# Patient Record
Sex: Female | Born: 1960 | Race: White | Hispanic: No | Marital: Married | Smoking: Former smoker
Health system: Southern US, Community
[De-identification: ages and names within clinical notes are randomized; demographics above are authoritative.]

## PROBLEM LIST (undated history)

## (undated) DIAGNOSIS — M069 Rheumatoid arthritis, unspecified: Secondary | ICD-10-CM

## (undated) DIAGNOSIS — G243 Spasmodic torticollis: Secondary | ICD-10-CM

## (undated) DIAGNOSIS — M659 Synovitis and tenosynovitis, unspecified: Secondary | ICD-10-CM

## (undated) DIAGNOSIS — Z8619 Personal history of other infectious and parasitic diseases: Secondary | ICD-10-CM

## (undated) DIAGNOSIS — F329 Major depressive disorder, single episode, unspecified: Secondary | ICD-10-CM

## (undated) DIAGNOSIS — M797 Fibromyalgia: Secondary | ICD-10-CM

## (undated) DIAGNOSIS — R42 Dizziness and giddiness: Secondary | ICD-10-CM

## (undated) DIAGNOSIS — M65949 Unspecified synovitis and tenosynovitis, unspecified hand: Secondary | ICD-10-CM

## (undated) DIAGNOSIS — K219 Gastro-esophageal reflux disease without esophagitis: Secondary | ICD-10-CM

## (undated) DIAGNOSIS — F32A Depression, unspecified: Secondary | ICD-10-CM

## (undated) DIAGNOSIS — F419 Anxiety disorder, unspecified: Secondary | ICD-10-CM

## (undated) HISTORY — DX: Anxiety disorder, unspecified: F41.9

## (undated) HISTORY — DX: Unspecified synovitis and tenosynovitis, unspecified hand: M65.949

## (undated) HISTORY — DX: Dizziness and giddiness: R42

## (undated) HISTORY — PX: TONSILLECTOMY: SUR1361

## (undated) HISTORY — DX: Synovitis and tenosynovitis, unspecified: M65.9

## (undated) HISTORY — PX: TUBAL LIGATION: SHX77

## (undated) HISTORY — DX: Personal history of other infectious and parasitic diseases: Z86.19

---

## 2002-03-11 ENCOUNTER — Other Ambulatory Visit: Admission: RE | Admit: 2002-03-11 | Discharge: 2002-03-11 | Payer: Self-pay | Admitting: Family Medicine

## 2003-03-16 ENCOUNTER — Encounter: Payer: Self-pay | Admitting: General Surgery

## 2003-03-16 ENCOUNTER — Ambulatory Visit (HOSPITAL_COMMUNITY): Admission: RE | Admit: 2003-03-16 | Discharge: 2003-03-16 | Payer: Self-pay | Admitting: General Surgery

## 2003-04-13 ENCOUNTER — Ambulatory Visit (HOSPITAL_COMMUNITY): Admission: RE | Admit: 2003-04-13 | Discharge: 2003-04-13 | Payer: Self-pay | Admitting: Family Medicine

## 2003-04-13 ENCOUNTER — Encounter: Payer: Self-pay | Admitting: Family Medicine

## 2005-01-12 ENCOUNTER — Other Ambulatory Visit: Admission: RE | Admit: 2005-01-12 | Discharge: 2005-01-12 | Payer: Self-pay | Admitting: Obstetrics and Gynecology

## 2005-01-18 ENCOUNTER — Ambulatory Visit (HOSPITAL_COMMUNITY): Admission: RE | Admit: 2005-01-18 | Discharge: 2005-01-18 | Payer: Self-pay | Admitting: Obstetrics and Gynecology

## 2006-01-15 ENCOUNTER — Other Ambulatory Visit: Admission: RE | Admit: 2006-01-15 | Discharge: 2006-01-15 | Payer: Self-pay | Admitting: Obstetrics and Gynecology

## 2006-01-30 ENCOUNTER — Ambulatory Visit (HOSPITAL_COMMUNITY): Admission: RE | Admit: 2006-01-30 | Discharge: 2006-01-30 | Payer: Self-pay | Admitting: Obstetrics and Gynecology

## 2006-09-01 HISTORY — PX: VAGINAL HYSTERECTOMY: SUR661

## 2006-09-12 ENCOUNTER — Ambulatory Visit (HOSPITAL_COMMUNITY): Admission: RE | Admit: 2006-09-12 | Discharge: 2006-09-13 | Payer: Self-pay | Admitting: Obstetrics and Gynecology

## 2006-09-12 ENCOUNTER — Encounter (INDEPENDENT_AMBULATORY_CARE_PROVIDER_SITE_OTHER): Payer: Self-pay | Admitting: *Deleted

## 2007-01-17 ENCOUNTER — Other Ambulatory Visit: Admission: RE | Admit: 2007-01-17 | Discharge: 2007-01-17 | Payer: Self-pay | Admitting: Obstetrics and Gynecology

## 2007-02-01 ENCOUNTER — Ambulatory Visit (HOSPITAL_COMMUNITY): Admission: RE | Admit: 2007-02-01 | Discharge: 2007-02-01 | Payer: Self-pay | Admitting: Obstetrics and Gynecology

## 2008-01-23 ENCOUNTER — Other Ambulatory Visit: Admission: RE | Admit: 2008-01-23 | Discharge: 2008-01-23 | Payer: Self-pay | Admitting: Obstetrics and Gynecology

## 2008-04-30 ENCOUNTER — Emergency Department (HOSPITAL_COMMUNITY): Admission: EM | Admit: 2008-04-30 | Discharge: 2008-04-30 | Payer: Self-pay | Admitting: Emergency Medicine

## 2009-04-30 ENCOUNTER — Ambulatory Visit: Payer: Self-pay | Admitting: Obstetrics and Gynecology

## 2009-04-30 ENCOUNTER — Other Ambulatory Visit: Admission: RE | Admit: 2009-04-30 | Discharge: 2009-04-30 | Payer: Self-pay | Admitting: Obstetrics and Gynecology

## 2009-04-30 ENCOUNTER — Encounter: Payer: Self-pay | Admitting: Obstetrics and Gynecology

## 2009-05-13 ENCOUNTER — Ambulatory Visit (HOSPITAL_COMMUNITY): Admission: RE | Admit: 2009-05-13 | Discharge: 2009-05-13 | Payer: Self-pay | Admitting: Obstetrics and Gynecology

## 2009-05-24 ENCOUNTER — Encounter: Admission: RE | Admit: 2009-05-24 | Discharge: 2009-05-24 | Payer: Self-pay | Admitting: Obstetrics and Gynecology

## 2009-08-23 ENCOUNTER — Emergency Department (HOSPITAL_COMMUNITY): Admission: EM | Admit: 2009-08-23 | Discharge: 2009-08-23 | Payer: Self-pay | Admitting: Emergency Medicine

## 2009-10-07 ENCOUNTER — Encounter (INDEPENDENT_AMBULATORY_CARE_PROVIDER_SITE_OTHER): Payer: Self-pay | Admitting: *Deleted

## 2009-10-11 ENCOUNTER — Ambulatory Visit: Payer: Self-pay | Admitting: Gastroenterology

## 2009-10-25 ENCOUNTER — Ambulatory Visit: Payer: Self-pay | Admitting: Gastroenterology

## 2009-10-25 HISTORY — PX: COLONOSCOPY: SHX174

## 2010-08-02 NOTE — Letter (Signed)
Summary: [facility] Instructions  [facility]  [address] [place], [place] [address]   Phone: [phone]  Fax: [phone]       Courtney Myers    1961-01-28    MRN: [id number]        Procedure Day Dorna Bloom:  [facility]  10/25/09     Arrival Time:  10:00AM     Procedure Time:  11:00AM     Location of Procedure:                    _X _  [facility] (4th Floor)                        PREPARATION FOR COLONOSCOPY WITH MOVIPREP   Starting 5 days prior to your procedure 10/20/09 do not eat nuts, seeds, popcorn, corn, beans, peas,  salads, or any raw vegetables.  Do not take any fiber supplements (e.g. Metamucil, Citrucel, and Benefiber).  THE DAY BEFORE YOUR PROCEDURE         DATE: 10/24/09  DAY: SUNDAY  1.  Drink clear liquids the entire day-NO SOLID FOOD  2.  Do not drink anything colored red or purple.  Avoid juices with pulp.  No orange juice.  3.  Drink at least 64 oz. (8 glasses) of fluid/clear liquids during the day to prevent dehydration and help the prep work efficiently.  CLEAR LIQUIDS INCLUDE: Water Jello Ice Popsicles Tea (sugar ok, no milk/cream) Powdered fruit flavored drinks Coffee (sugar ok, no milk/cream) Gatorade Juice: apple, white grape, white cranberry  Lemonade Clear bullion, consomm, broth Carbonated beverages (any kind) Strained chicken noodle soup Hard Candy                             4.  In the morning, mix first dose of MoviPrep solution:    Empty 1 Pouch A and 1 Pouch B into the disposable container    Add lukewarm drinking water to the top line of the container. Mix to dissolve    Refrigerate (mixed solution should be used within 24 hrs)  5.  Begin drinking the prep at 5:00 p.m. The MoviPrep container is divided by 4 marks.   Every 15 minutes drink the solution down to the next mark (approximately 8 oz) until the full liter is complete.   6.  Follow completed prep with 16 oz of clear liquid of your choice (Nothing  red or purple).  Continue to drink clear liquids until bedtime.  7.  Before going to bed, mix second dose of MoviPrep solution:    Empty 1 Pouch A and 1 Pouch B into the disposable container    Add lukewarm drinking water to the top line of the container. Mix to dissolve    Refrigerate  THE DAY OF YOUR PROCEDURE      DATE: 10/25/09  DAY: MONDAY  Beginning at 6:00a.m. (5 hours before procedure):         1. Every 15 minutes, drink the solution down to the next mark (approx 8 oz) until the full liter is complete.  2. Follow completed prep with 16 oz. of clear liquid of your choice.    3. You may drink clear liquids until 9:00AM (2 HOURS BEFORE PROCEDURE).   MEDICATION INSTRUCTIONS  Unless otherwise instructed, you should take regular prescription medications with a small sip of water   as early as possible the morning  of your procedure.           OTHER INSTRUCTIONS  You will need a responsible adult at least 52 years of age to accompany you and drive you home.   This person must remain in the waiting room during your procedure.  Wear loose fitting clothing that is easily removed.  Leave jewelry and other valuables at home.  However, you may wish to bring a book to read or  an iPod/MP3 player to listen to music as you wait for your procedure to start.  Remove all body piercing jewelry and leave at home.  Total time from sign-in until discharge is approximately 2-3 hours.  You should go home directly after your procedure and rest.  You can resume normal activities the  day after your procedure.  The day of your procedure you should not:   Drive   Make legal decisions   Operate machinery   Drink alcohol   Return to work  You will receive specific instructions about eating, activities and medications before you leave.    The above instructions have been reviewed and explained to me by   Ernestine Conrad, RN_______________________    I fully understand and  can verbalize these instructions _____________________________ Date _________

## 2010-08-02 NOTE — Procedures (Signed)
Summary: Colonoscopy  Patient: Courtney Myers Note: All result statuses are Final unless otherwise noted.  Tests: (1) Colonoscopy (COL)   COL Colonoscopy           DONE     [facility]     [address]     [place], [place]  [address]           COLONOSCOPY PROCEDURE REPORT           PATIENT:  [name], [name]  MR#:  [id number]     BIRTHDATE:  07-28-1960, 49 yrs. old  GENDER:  female     ENDOSCOPIST:  Vania Rea. Jarold Motto, MD, [facility] [place]     REF. BY:  Renaye Rakers, M.D.     PROCEDURE DATE:  10/25/2009     PROCEDURE:  Average-risk screening colonoscopy     G0121     ASA CLASS:  Class II     INDICATIONS:  Routine Risk Screening     MEDICATIONS:   Fentanyl 75 mcg IV, Versed 8 mg IV, Benadryl 12.5     mg IV           DESCRIPTION OF PROCEDURE:   After the risks benefits and     alternatives of the procedure were thoroughly explained, informed     consent was obtained.  Digital rectal exam was performed and     revealed no abnormalities.   The LB CF-[id number] endoscope     was introduced through the anus and advanced to the cecum, which     was identified by both the appendix and ileocecal valve, without     limitations.  The quality of the prep was excellent, using     MoviPrep.  The instrument was then slowly withdrawn as the colon     was fully examined.     <<PROCEDUREIMAGES>>           FINDINGS:  Scattered diverticula were found in the sigmoid colon.     No polyps or cancers were seen.  This was otherwise a normal     examination of the colon.   Retroflexed views in the rectum     revealed no abnormalities.    The scope was then withdrawn from     the patient and the procedure completed.           COMPLICATIONS:  None     ENDOSCOPIC IMPRESSION:     1) Diverticula, scattered in the sigmoid colon     2) No polyps or cancers     3) Otherwise normal examination     RECOMMENDATIONS:     1) high fiber diet     2) Continue current colorectal screening recommendations for  "routine risk" patients with a repeat colonoscopy in 10 years.     REPEAT EXAM:  No           ______________________________     Vania Rea. Jarold Motto, MD, [facility]           CC:           n.     eSIGNED:   Vania Rea. Landyn Lorincz at 10/25/2009 11:47 AM           Charlott Rakes, [id number]  Note: An exclamation mark (!) indicates a result that was not dispersed into the flowsheet. Document Creation Date: 10/25/2009 11:48 AM _______________________________________________________________________  (1) Order result status: Final Collection or observation date-time: 10/25/2009 11:43 Requested date-time:  Receipt date-time:  Reported date-time:  Referring Physician:  Ordering Physician: Sheryn Bison [id number]) Specimen Source:  Source: Launa Grill Order Number: [id number] Lab site:   Appended Document: Colonoscopy    Clinical Lists Changes  Observations: Added new observation of COLONNXTDUE: 10/2019 (10/25/2009 12:43)

## 2010-08-02 NOTE — Miscellaneous (Signed)
Summary: previsit   Clinical Lists Changes  Medications: Added new medication of MOVIPREP 100 GM  SOLR (PEG-KCL-NACL-NASULF-NA ASC-C) As directed - Signed Rx of MOVIPREP 100 GM  SOLR (PEG-KCL-NACL-NASULF-NA ASC-C) As directed;  #1 x 0;  Signed;  Entered by: Clide Cliff RN;  Authorized by: Mardella Layman MD [facility];  Method used: Electronically to [facility] [address] [place], [place], [place]  [address], Ph: [phone], Fax: [phone] Observations: Added new observation of ALLERGY REV: Done (10/11/2009 12:43)    Prescriptions: MOVIPREP 100 GM  SOLR (PEG-KCL-NACL-NASULF-NA ASC-C) As directed  #1 x 0   Entered by:   Clide Cliff RN   Authorized by:   Mardella Layman MD [facility]   Signed by:   Clide Cliff RN on 10/11/2009   Method used:   Electronically to        [facility]* (retail)       [address]       [place], [place]  [address]       Ph: [phone]       Fax: [phone]   RxID:   [id number]

## 2010-08-03 ENCOUNTER — Other Ambulatory Visit: Payer: Self-pay | Admitting: Obstetrics and Gynecology

## 2010-08-03 DIAGNOSIS — Z1239 Encounter for other screening for malignant neoplasm of breast: Secondary | ICD-10-CM

## 2010-08-03 DIAGNOSIS — Z1231 Encounter for screening mammogram for malignant neoplasm of breast: Secondary | ICD-10-CM

## 2010-08-11 ENCOUNTER — Ambulatory Visit (HOSPITAL_COMMUNITY): Admission: RE | Admit: 2010-08-11 | Payer: 59 | Source: Ambulatory Visit

## 2010-11-18 NOTE — Discharge Summary (Signed)
NAME:  Courtney Myers, Courtney Myers                ACCOUNT NO.:  [id number]   MEDICAL RECORD NO.:  [id number]          PATIENT TYPE:  OIB   LOCATION:  9317                          [facility]ACILITY:  [facility]   PHYSICIAN:  Courtney L. Gottsegen, M.D.DATE OF BIRTH:  13-Jan-1961   DATE OF ADMISSION:  09/12/2006  DATE OF DISCHARGE:  09/13/2006                               DISCHARGE SUMMARY   The patient is a 50 year old female who was admitted to the hospital  with menorrhagia for definitive surgery. On the day of admission she was  taken to the operating room and a vaginal hysterectomy was performed.  She was kept overnight for observation. On the next day she was ready  for discharge.   DISCHARGE MEDICATIONS:  Advil or Aleve for pain relief with Vicodin  5/500 as backup.   DIET:  Was regular.   ACTIVITY:  Was ambulatory.   FOLLOWUP:  She will return to the office in 3 weeks for follow-up.  Final pathology report is not available at time of dictation.   DISCHARGE DIAGNOSIS:  Refractory dysfunctional uterine bleeding with  probable adenomyosis.   OPERATION:  Vaginal hysterectomy.   CONDITION ON DISCHARGE:  Is improved.      Courtney Myers, M.D.  Electronically Signed     DLG/MEDQ  D:  09/13/2006  T:  09/13/2006  Job:  [id number]

## 2010-11-18 NOTE — H&P (Signed)
NAME:  Courtney Myers, Courtney Myers NO.:  [id number]   MEDICAL RECORD NO.:  [id number]         PATIENT TYPE:  WOIB   LOCATION:                                [facility]   PHYSICIAN:  Daniel L. Gottsegen, M.D.DATE OF BIRTH:  1960/10/09   DATE OF ADMISSION:  09/12/2006  DATE OF DISCHARGE:  09/13/2006                              HISTORY & PHYSICAL   She is for the operating room in [facility] tomorrow at 1 p.m.   CHIEF COMPLAINT:  Menometrorrhagia with fatigue.   HISTORY OF PRESENT ILLNESS:  The patient is a 50 year old Gravida IV,  para 2-0-2-2 who over the past several years has had a progressively  increasing history of very heavy long periods. Her periods last seven  days.  She has to change tampons frequently. She is becoming extremely  fatigued by this. It is clearly impacting her life significantly.  She  remains a smoker and, therefore, is not a candidate for birth control  pills on a long term  basis.  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 She has had two D&C's after two pregnancies ended and the  suspicion is that this is what they are from.  However, the presence of  these multiple synechiae makes her a bad candidate for endometrial  ablation.  As a result of this significant issue, and her failure to  respond to nonsteroidals and all the above, she now enters the hospital  for a vaginal hysterectomy.  We will save her ovaries assuming that they  are normal but she has given me permission to remove them for  significant disease.   PAST MEDICAL HISTORY:  The patient has had tubal ligation.  She has also  had a T&A.  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  She has also had CIN and  underwent carotid surgery for that.   CURRENT MEDICATIONS:   AcipHex, Klonopin, Paxil for anxiety.   ALLERGIES:  She is allergic to no drugs.   FAMILY HISTORY:  Reveals that paternal grandfather is diabetic. Her  father, maternal grandfather are hypertensive. Her paternal grandfather  and father also have coronary artery disease.   SOCIAL HISTORY:  Patient is a smoker. She also drinks alcohol socially.  She also drinks significant caffeine.   REVIEW OF SYSTEMS:  HEENT:  Negative.  CARDIAC: Negative.  RESPIRATORY:  Negative.  GI:  Negative.  MUSCULOSKELETAL:  See above.  GU:  Negative.  NEUROLOGICAL/PSYCHIATRIC:  Reveals a history of  both tremors and  anxiety.  An immunological, lymphatic and endocrine is negative.   PHYSICAL EXAMINATION:  GENERAL:  The patient is a well developed, well  nourished female in no acute distress.  VITAL SIGNS:  Blood pressure 120/70, pulse 80 and regular, respiration  rate 16 and unlabored. She is afebrile.  HEENT:  All within normal limits.  NECK:  Supple. Trachea in the  midline. Thyroid is not enlarged.  LUNGS:  Clear to P&A.  HEART:  No thrills, heaves or murmurs.  BREASTS:  No masses.  ABDOMEN:  Soft without guarding, rebound or masses.  PELVIC:  External is normal.  BUS is normal. Vaginal is normal. Cervix  is clean. Pap smear shows no atypia. Uterus is retroverted.  Normal size  and shape.  Adnexa failed to reveal masses.  RECTAL:  Rectal is negative.   IMPRESSION:  Menometrorrhagia.   PLAN:  Vaginal hysterectomy.      Daniel L. Eda Paschal, M.D.  Electronically Signed     DLG/MEDQ  D:  09/11/2006  T:  09/11/2006  Job:  [id number]

## 2010-11-18 NOTE — Op Note (Signed)
NAME:  Courtney Myers, Courtney Myers                ACCOUNT NO.:  [id number]   MEDICAL RECORD NO.:  [id number]          PATIENT TYPE:  OIB   LOCATION:  9317                          [facility]ILITY:  [facility]   PHYSICIAN:  Daniel L. Gottsegen, M.D.DATE OF BIRTH:  Feb 21, 1961   DATE OF PROCEDURE:  09/12/2006  DATE OF DISCHARGE:                               OPERATIVE REPORT   PREOPERATIVE DIAGNOSIS:  Dysfunctional uterine bleeding.   POSTOPERATIVE DIAGNOSIS:  Dysfunctional uterine bleeding with probable  adenomyosis.   OPERATIONS:  Vaginal hysterectomy.   SURGEON:  Daniel L. Eda Paschal, M.D.   FIRST ASSISTANT:  Juan H. Lily Peer, M.D.   FINDINGS:  At the time of surgery, the patient had a very boggy enlarged  uterus without any specific pathology.  Ovaries, fallopian tubes and  pelvic peritoneum were all free of disease.   PROCEDURE:  After adequate general endotracheal anesthesia, the patient  was placed in the dorsal lithotomy position, prepped and draped in the  usual sterile manner.  A 1:200,000 solution of epinephrine was injected  around the cervix.  A 360 degree incision was made around the cervix.  The bladder was mobilized superiorly as was the posterior peritoneum.  Posterior peritoneum was entered by sharp dissection.  The uterosacral  ligaments were clamped, cut and sutured to the vault laterally for good  vault support.  They were also shortened.  The cardinal ligaments were  clamped, cut and suture ligated.  At this point the vesicouterine fold  of peritoneum could be entered by sharp dissection without trauma.  The  uterine arteries were clamped, cut and doubly suture ligated.  The  balance of the broad ligament was clamped, cut and suture ligated.  Suture material for all the above-mentioned pedicles was #1 chromic  catgut.  The uterus was delivered and the utero-ovarian round ligaments  and fallopian tubes were clamped, cut and doubly suture ligated with #1  chromic catgut.  The uterus was  sent to pathology for tissue diagnosis.  The ovaries were inspected and were normal.  The vaginal cuff was whip  stitched with a running locking 0 Vicryl, sewing the cuff to the  peritoneum.  There was no bleeding noted at this point.  Copious  irrigation was done with sterile saline.  Two sponge, needle and  instrument counts were correct.  The vaginal cuff and the peritoneum  were then closed with figure-of-eights of #1 chromic catgut.  A Foley  catheter was placed at the termination of procedure which drained clear  urine.  The patient left the operating satisfactory condition.      Daniel L. Eda Paschal, M.D.  Electronically Signed     DLG/MEDQ  D:  09/12/2006  T:  09/13/2006  Job:  [id number]

## 2012-05-06 ENCOUNTER — Other Ambulatory Visit: Payer: Self-pay | Admitting: Obstetrics and Gynecology

## 2012-05-06 DIAGNOSIS — Z1231 Encounter for screening mammogram for malignant neoplasm of breast: Secondary | ICD-10-CM

## 2012-05-22 ENCOUNTER — Ambulatory Visit (HOSPITAL_COMMUNITY): Payer: 59 | Attending: Obstetrics and Gynecology

## 2012-09-05 ENCOUNTER — Other Ambulatory Visit: Payer: Self-pay | Admitting: Gastroenterology

## 2012-09-05 DIAGNOSIS — R7989 Other specified abnormal findings of blood chemistry: Secondary | ICD-10-CM

## 2012-09-12 ENCOUNTER — Ambulatory Visit
Admission: RE | Admit: 2012-09-12 | Discharge: 2012-09-12 | Disposition: A | Discharge status: Home / Self Care | Payer: 59 | Source: Ambulatory Visit | Attending: Gastroenterology | Admitting: Gastroenterology

## 2012-09-12 DIAGNOSIS — R7989 Other specified abnormal findings of blood chemistry: Secondary | ICD-10-CM

## 2012-09-22 ENCOUNTER — Encounter (HOSPITAL_COMMUNITY): Payer: Self-pay | Admitting: Emergency Medicine

## 2012-09-22 ENCOUNTER — Emergency Department (HOSPITAL_COMMUNITY)
Admission: EM | Admit: 2012-09-22 | Discharge: 2012-09-22 | Disposition: A | Discharge status: Home / Self Care | Payer: 59 | Attending: Emergency Medicine | Admitting: Emergency Medicine

## 2012-09-22 DIAGNOSIS — K219 Gastro-esophageal reflux disease without esophagitis: Secondary | ICD-10-CM | POA: Insufficient documentation

## 2012-09-22 DIAGNOSIS — S79929A Unspecified injury of unspecified thigh, initial encounter: Secondary | ICD-10-CM | POA: Insufficient documentation

## 2012-09-22 DIAGNOSIS — S79919A Unspecified injury of unspecified hip, initial encounter: Secondary | ICD-10-CM | POA: Insufficient documentation

## 2012-09-22 DIAGNOSIS — F329 Major depressive disorder, single episode, unspecified: Secondary | ICD-10-CM | POA: Insufficient documentation

## 2012-09-22 DIAGNOSIS — Z79899 Other long term (current) drug therapy: Secondary | ICD-10-CM | POA: Insufficient documentation

## 2012-09-22 DIAGNOSIS — Z87891 Personal history of nicotine dependence: Secondary | ICD-10-CM | POA: Insufficient documentation

## 2012-09-22 DIAGNOSIS — Z8739 Personal history of other diseases of the musculoskeletal system and connective tissue: Secondary | ICD-10-CM | POA: Insufficient documentation

## 2012-09-22 DIAGNOSIS — F3289 Other specified depressive episodes: Secondary | ICD-10-CM | POA: Insufficient documentation

## 2012-09-22 DIAGNOSIS — Y9289 Other specified places as the place of occurrence of the external cause: Secondary | ICD-10-CM | POA: Insufficient documentation

## 2012-09-22 DIAGNOSIS — S76301A Unspecified injury of muscle, fascia and tendon of the posterior muscle group at thigh level, right thigh, initial encounter: Secondary | ICD-10-CM

## 2012-09-22 DIAGNOSIS — W010XXA Fall on same level from slipping, tripping and stumbling without subsequent striking against object, initial encounter: Secondary | ICD-10-CM | POA: Insufficient documentation

## 2012-09-22 DIAGNOSIS — Y939 Activity, unspecified: Secondary | ICD-10-CM | POA: Insufficient documentation

## 2012-09-22 DIAGNOSIS — Z8669 Personal history of other diseases of the nervous system and sense organs: Secondary | ICD-10-CM | POA: Insufficient documentation

## 2012-09-22 HISTORY — DX: Depression, unspecified: F32.A

## 2012-09-22 HISTORY — DX: Major depressive disorder, single episode, unspecified: F32.9

## 2012-09-22 HISTORY — DX: Rheumatoid arthritis, unspecified: M06.9

## 2012-09-22 HISTORY — DX: Gastro-esophageal reflux disease without esophagitis: K21.9

## 2012-09-22 HISTORY — DX: Spasmodic torticollis: G24.3

## 2012-09-22 HISTORY — DX: Fibromyalgia: M79.7

## 2012-09-22 MED ORDER — HYDROCODONE-ACETAMINOPHEN 5-325 MG PO TABS: ORAL_TABLET | ORAL | Status: DC

## 2012-09-22 NOTE — ED Notes (Signed)
Patient c/o right upper leg pain. Per patient slipped and fell on ice last Tuesday, pain since. Increased patient with flexion of foot. Capillary refill WNL, pedal and tibial pulse strong. Foot warm.

## 2012-09-24 NOTE — ED Provider Notes (Signed)
History     CSN: [id number]  Arrival date & time 09/22/12  1201   First MD Initiated Contact with Patient 09/22/12 1359      Chief Complaint  Patient presents with  . Leg Pain    (Consider location/radiation/quality/duration/timing/severity/associated sxs/prior treatment) HPI Comments: Patient complains of pain to the posterior right upper leg after a fall. She states the pain is worse with weightbearing and full extension of the leg, pain resolves with flexion and bed rest. She denies numbness, weakness, swelling or discoloration to the leg. She also denies any hip or back pain.  Patient is a 52 y.o. female presenting with leg pain. The history is provided by the patient.  Leg Pain Location:  Leg Time since incident:  5 days Injury: yes   Mechanism of injury: fall   Fall:    Fall occurred: Fell on ice.   Impact surface:  Concrete   Entrapped after fall: no   Leg location:  R upper leg Pain details:    Quality:  Aching   Radiates to:  Does not radiate   Severity:  Mild   Onset quality:  Sudden   Timing:  Constant   Progression:  Unchanged Chronicity:  New Dislocation: no   Foreign body present:  No foreign bodies Prior injury to area:  No Relieved by:  Rest Worsened by:  Bearing weight and extension Ineffective treatments:  None tried Associated symptoms: no back pain, no decreased ROM, no fever, no itching, no muscle weakness, no neck pain, no numbness, no stiffness, no swelling and no tingling     Past Medical History  Diagnosis Date  . RA (rheumatoid arthritis)   . Fibromyalgia   . Cervical dystonia   . GERD (gastroesophageal reflux disease)   . Depression     Past Surgical History  Procedure Laterality Date  . Partial hysterectomy  2008  . Tonsillectomy      Family History  Problem Relation Age of Onset  . Heart failure Other     History  Substance Use Topics  . Smoking status: Former Smoker -- 1.00 packs/day for 30 years    Types: Cigarettes    Quit date: 07/03/2012  . Smokeless tobacco: Never Used  . Alcohol Use: No    OB History   Grav Para Term Preterm Abortions TAB SAB Ect Mult Living   4 2  2 2  1   2       Review of Systems  Constitutional: Negative for fever and chills.  HENT: Negative for neck pain.   Genitourinary: Negative for dysuria and difficulty urinating.  Musculoskeletal: Positive for myalgias. Negative for back pain, joint swelling, arthralgias, gait problem and stiffness.  Skin: Negative for color change, itching and wound.  Neurological: Negative for dizziness, syncope, weakness, numbness and headaches.  All other systems reviewed and are negative.    Allergies  Review of patient's allergies indicates no known allergies.  Home Medications   Current Outpatient Rx  Name  Route  Sig  Dispense  Refill  . clonazePAM (KLONOPIN) 0.5 MG tablet   Oral   Take 0.5 mg by mouth 2 (two) times daily.         . DULoxetine (CYMBALTA) 60 MG capsule   Oral   Take 60 mg by mouth daily.         . folic acid (FOLVITE) 1 MG tablet   Oral   Take 1 mg by mouth daily.         Marland Kitchen  inFLIXimab (REMICADE) 100 MG injection   Intravenous   Inject 100 mg into the vein every 8 (eight) weeks.         . METHOTREXATE SODIUM, PF, IJ   Injection   Inject 0.3 mLs as directed once a week.         . Multiple Vitamins-Minerals (MULTIVITAMINS THER. W/MINERALS) TABS   Oral   Take 1 tablet by mouth daily.         Marland Kitchen omeprazole (PRILOSEC) 20 MG capsule   Oral   Take 20 mg by mouth daily.         Marland Kitchen PRESCRIPTION MEDICATION   Oral   Take 1 tablet by mouth every evening. Prescription Sleeping pill         . HYDROcodone-acetaminophen (NORCO/VICODIN) 5-325 MG per tablet      Take one-two tabs po q 4-6 hrs prn pain   20 tablet   0     BP 127/79  Pulse 103  Temp(Src) 98.2 F (36.8 C) (Oral)  Resp 20  Ht 5\' 3"  (1.6 m)  Wt 170 lb (77.111 kg)  BMI 30.12 kg/m2  SpO2 97%  Physical Exam  Nursing note and  vitals reviewed. Constitutional: She is oriented to person, place, and time. She appears well-developed and well-nourished. No distress.  Cardiovascular: Normal rate, regular rhythm, normal heart sounds and intact distal pulses.   Pulmonary/Chest: Effort normal and breath sounds normal.  Musculoskeletal: She exhibits tenderness. She exhibits no edema.       Right upper leg: She exhibits tenderness. She exhibits no bony tenderness, no swelling, no edema, no deformity and no laceration.       Legs: Tenderness to palpation of the right hamstring. No joint tenderness, no spinal tenderness, no distal tenderness. DP pulse is brisk and symmetrical, distal sensation is intact. Patient has full range of motion of the knee and hip.  Neurological: She is alert and oriented to person, place, and time. No cranial nerve deficit or sensory deficit. She exhibits normal muscle tone. Coordination and gait normal.  Reflex Scores:      Patellar reflexes are 2+ on the right side and 2+ on the left side.      Achilles reflexes are 2+ on the right side. Skin: Skin is warm and dry. No erythema.    ED Course  Procedures (including critical care time)  Labs Reviewed - No data to display No results found.   1. Hamstring injury, right, initial encounter       MDM    Patient has tenderness to palpation along the right hamstring. No deformities are noted, just for range of motion. Neurovascularly intact. She agrees to ice and rest I will prescribe a hydrocodone #20 for pain. She currently takes Remicade and methotrexate for RA, so I will avoid NSAIDs.  Crutches were given for comfort with ambulation  Patient agrees to followup with her primary care physician. Appears stable for discharge.      Amarylis Rovito L. Trisha Mangle, PA-C 09/24/12 1747

## 2012-09-25 NOTE — ED Provider Notes (Signed)
History/physical exam/procedure(s) were performed by non-physician practitioner and as supervising physician I was immediately available for consultation/collaboration. I have reviewed all notes and am in agreement with care and plan.   Danell Vazquez S Addisson Frate, MD 09/25/12 1348 

## 2012-10-21 ENCOUNTER — Telehealth: Payer: Self-pay | Admitting: Physician Assistant

## 2012-10-21 MED ORDER — OMEPRAZOLE 20 MG PO CPDR: 20.0000 mg | DELAYED_RELEASE_CAPSULE | Freq: Every day | ORAL | Status: DC

## 2012-10-21 NOTE — Telephone Encounter (Signed)
Medication refilled per protocol. 

## 2012-12-04 ENCOUNTER — Encounter: Payer: Self-pay | Admitting: Neurology

## 2012-12-04 ENCOUNTER — Ambulatory Visit (INDEPENDENT_AMBULATORY_CARE_PROVIDER_SITE_OTHER): Payer: 59 | Admitting: Neurology

## 2012-12-04 VITALS — BP 105/69 | HR 100 | Ht 65.0 in | Wt 165.0 lb

## 2012-12-04 DIAGNOSIS — G243 Spasmodic torticollis: Secondary | ICD-10-CM

## 2012-12-04 MED ORDER — ABOBOTULINUMTOXINA 500 UNITS IM SOLR
500.0000 [IU] | Freq: Once | INTRAMUSCULAR | Status: AC
  Administered 2012-12-04: 500 [IU] via INTRAMUSCULAR

## 2012-12-04 NOTE — Progress Notes (Signed)
HPI: Ms. 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  Symptom onset was without apparent triggering event, she has gradually developed this neck pulling,  manifested primarily with right laterocollis, mild retrocolis, and left shoulder elevation since 2000. She has good relief with regular Botox injection, about 4 times a year since 2003. Last injection was February 18, 2010 by Dr. Deneen Harts,  She reported great relief as usual, taking 4- 5 days to get symptom relieve, lasting about 3 months.  I began to inject her since 12.2011, every 3-4 months, dosage has been limited to 150 units of BOTOX A.  She denies neck pain, gait difficulty, but with wearing off Botox, she noticed increased hand tremor, and pulling towards her right shoulder.  She developed  transient difficulty lifting her neck up when using BOTOX A 200 units previously, reponding well to decreased dose of 150 units  Initial  IPSEN dysport injection was in April 30th 2013, she later enrolled into open label, 2nd injection was in  May 28th 2013. She did very well.  Recent few weeks,  She had experienced flareup of her rheumatoid arthritis, has received IV infusion. Last injection was 06/10/2012,  500 units of dysport was dissolved into 2 cc of normal saline. Right longissimus capitus 0.5 cc Right splenius capitis 0.5 cc Right levator scapular 0.5 cc Right iliocostalis 0.5 cc  She tolerated the injection well.  Dysport research study is completed, she is now coming back for repeat injection, she noticed head bobbing over the past few weeks, she denies significant neck pain, weakness,  Her rheumatoid arthritis is under better control   PHYSICAL EXAMINATOINS:  Generalized: In no acute distress  Neck: Supple, no carotid bruits   Cardiac: Regular rate rhythm  Pulmonary: Clear to auscultation bilaterally  Musculoskeletal: No deformity  Neurologic Exam  Mental Status:  pleasant awake, alert, cooperative to history, talking, and casual conversation. she has occasional No-No head shaking. She has mild right neck tilt, slight left ward rotation, mild retrocolis, mild left shoulder elevation, right lateral shift TWSTRS was documented separately Cranial Nerves: CN II-XII pupils were equal round reactive to light.  Extraocular movements were full.  Visual fields were full on confrontational test.  Facial sensation and strength were normal.  Hearing was intact to finger rubbing bilaterally.  Uvula tongue were midline.  Head turning and shoulder shrugging were normal and symmetric.  Tongue protrusion into the cheeks strength were normal.  Motor: Normal tone, bulk, and strength. Sensory: Normal to light touch  Coordination:  There was no dysmetria noticed. Gait and Station: Narrow based and steady  Reflexes: Deep tendon reflexes symmetric,  Assessment and plan, Long-standing history of cervical dystonia, responded very well to botulinum toxin injection, 500 units of dysport was dissolved into 2 cc of [place]. (lot [id number] exp August 2014).  Left gladiator scapular 0.5 cc Right longissimus 0. 2 5 Right splenius capitis 0.2 5 cc x2 = 0 .5 Right splenius cervix 0. 2 5 x2 = 0. 5 Right iliocostalis 0.2 5  She tolerated the injection well, will return to clinic in 3 months for repeat injection

## 2012-12-18 ENCOUNTER — Other Ambulatory Visit: Payer: Self-pay

## 2012-12-18 MED ORDER — CLONAZEPAM 0.5 MG PO TABS: 0.5000 mg | ORAL_TABLET | Freq: Every day | ORAL | Status: DC

## 2013-02-24 ENCOUNTER — Other Ambulatory Visit: Payer: Self-pay | Admitting: Physician Assistant

## 2013-02-24 NOTE — Telephone Encounter (Signed)
Medication refilled per protocol. 

## 2013-02-28 ENCOUNTER — Encounter: Payer: Self-pay | Admitting: Family Medicine

## 2013-02-28 DIAGNOSIS — F32A Depression, unspecified: Secondary | ICD-10-CM | POA: Insufficient documentation

## 2013-02-28 DIAGNOSIS — K219 Gastro-esophageal reflux disease without esophagitis: Secondary | ICD-10-CM | POA: Insufficient documentation

## 2013-02-28 DIAGNOSIS — Z8619 Personal history of other infectious and parasitic diseases: Secondary | ICD-10-CM | POA: Insufficient documentation

## 2013-02-28 DIAGNOSIS — F329 Major depressive disorder, single episode, unspecified: Secondary | ICD-10-CM | POA: Insufficient documentation

## 2013-02-28 DIAGNOSIS — M659 Synovitis and tenosynovitis, unspecified: Secondary | ICD-10-CM | POA: Insufficient documentation

## 2013-02-28 DIAGNOSIS — M069 Rheumatoid arthritis, unspecified: Secondary | ICD-10-CM | POA: Insufficient documentation

## 2013-02-28 DIAGNOSIS — F419 Anxiety disorder, unspecified: Secondary | ICD-10-CM | POA: Insufficient documentation

## 2013-03-07 ENCOUNTER — Other Ambulatory Visit: Payer: Self-pay

## 2013-03-07 MED ORDER — CLONAZEPAM 0.5 MG PO TABS: 0.5000 mg | ORAL_TABLET | Freq: Every day | ORAL | Status: DC

## 2013-03-11 NOTE — Telephone Encounter (Signed)
Rx signed and faxed.

## 2013-03-12 ENCOUNTER — Ambulatory Visit: Payer: 59 | Admitting: Neurology

## 2013-03-13 ENCOUNTER — Ambulatory Visit (INDEPENDENT_AMBULATORY_CARE_PROVIDER_SITE_OTHER): Payer: 59 | Admitting: Neurology

## 2013-03-13 ENCOUNTER — Encounter: Payer: Self-pay | Admitting: Neurology

## 2013-03-13 VITALS — BP 125/78 | HR 92 | Ht 65.0 in | Wt 163.0 lb

## 2013-03-13 DIAGNOSIS — F32A Depression, unspecified: Secondary | ICD-10-CM

## 2013-03-13 DIAGNOSIS — G243 Spasmodic torticollis: Secondary | ICD-10-CM

## 2013-03-13 DIAGNOSIS — F329 Major depressive disorder, single episode, unspecified: Secondary | ICD-10-CM

## 2013-03-13 MED ORDER — CLONAZEPAM 0.5 MG PO TABS: 0.5000 mg | ORAL_TABLET | Freq: Two times a day (BID) | ORAL | Status: DC | PRN

## 2013-03-13 MED ORDER — ABOBOTULINUMTOXINA 500 UNITS IM SOLR
500.0000 [IU] | Freq: Once | INTRAMUSCULAR | Status: AC
  Administered 2013-03-13: 500 [IU] via INTRAMUSCULAR

## 2013-03-13 NOTE — Progress Notes (Signed)
HPI: 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  Symptom onset was without apparent triggering event, she has gradually developed this neck pulling,  manifested primarily with right laterocollis, mild retrocolis, and left shoulder elevation since 2000. She has good relief with regular Botox injection, about 4 times a year since 2003. Last injection was February 18, 2010 by Dr. Deneen Harts,  She reported great relief as usual, taking 4- 5 days to get symptom relieve, lasting about 3 months.  I began to inject her since 12.2011, every 3-4 months, dosage has been limited to 150 units of BOTOX A.  She denies neck pain, gait difficulty, but with wearing off Botox, she noticed increased head tremor, and pulling towards her right shoulder.  She developed  transient difficulty lifting her neck up when using BOTOX A 200 units previously, reponding well to decreased dose of 150 units  She was enrolled into IPSEN dysport study, first injection was in April 30th 2013, she later enrolled into open label, 2nd injection was in  May 28th 2013. She did very well.  Recent few weeks,  She had experienced flareup of her rheumatoid arthritis, has received IV infusion. Last injection was in 06/10/2012,  500 units of dysport was dissolved into 2 cc of normal saline. Right longissimus capitus 0.5 cc Right splenius capitis 0.5 cc Right levator scapular 0.5 cc Right iliocostalis 0.5 cc   Dysport research study has completed, she is now coming back for repeat injection, she noticed head bobbing over the past few weeks, she denies significant neck pain, weakness,  Her rheumatoid arthritis is under better control   UPDATE Mar 13 2013: Last injection was in June 4th 2014, she did very well, no neck extension weakness, noticed wearing off over the past 3 weeks,  PHYSICAL EXAMINATOINS:  Generalized: In no acute distress  Neck: Supple, no carotid bruits    Cardiac: Regular rate rhythm  Pulmonary: Clear to auscultation bilaterally  Musculoskeletal: No deformity  Neurologic Exam  Mental Status: pleasant awake, alert, cooperative to history, talking, and casual conversation. she has occasional No-No head shaking. She has mild right neck tilt, slight left ward rotation, mild retrocolis, mild left shoulder elevation, slight right lateral shift. Cranial Nerves: CN II-XII pupils were equal round reactive to light.  Extraocular movements were full.  Visual fields were full on confrontational test.  Facial sensation and strength were normal.  Hearing was intact to finger rubbing bilaterally.  Uvula tongue were midline.  Head turning and shoulder shrugging were normal and symmetric.  Tongue protrusion into the cheeks strength were normal.  Motor: Normal tone, bulk, and strength. Sensory: Normal to light touch  Coordination:  There was no dysmetria noticed. Gait and Station: Narrow based and steady  Reflexes: Deep tendon reflexes symmetric,  Assessment and plan, Long-standing history of cervical dystonia, responded very well to botulinum toxin injection.  500 units of dysport was dissolved into 2 cc of [place]. (lot [id number] exp February 2015)  Left levator scapular 0.5 cc Right longissimus 0. 2 5 Right splenius capitis 0.2 5 cc Right splenius cervix 0. 2 5 Right iliocostalis 0.2 5  Left splenius capitis 0.2 5 Left splenius cervix 0.2 5  She tolerated the injection well, will return to clinic in 3 months for repeat injection

## 2013-04-24 ENCOUNTER — Encounter (HOSPITAL_COMMUNITY): Payer: Self-pay | Admitting: *Deleted

## 2013-04-24 ENCOUNTER — Inpatient Hospital Stay (HOSPITAL_COMMUNITY)
Admission: AD | Admit: 2013-04-24 | Discharge: 2013-04-24 | Disposition: A | Discharge status: Home / Self Care | Payer: 59 | Source: Ambulatory Visit | Attending: Obstetrics and Gynecology | Admitting: Obstetrics and Gynecology

## 2013-04-24 DIAGNOSIS — A499 Bacterial infection, unspecified: Secondary | ICD-10-CM

## 2013-04-24 DIAGNOSIS — Z202 Contact with and (suspected) exposure to infections with a predominantly sexual mode of transmission: Secondary | ICD-10-CM | POA: Insufficient documentation

## 2013-04-24 DIAGNOSIS — N760 Acute vaginitis: Secondary | ICD-10-CM | POA: Insufficient documentation

## 2013-04-24 DIAGNOSIS — N949 Unspecified condition associated with female genital organs and menstrual cycle: Secondary | ICD-10-CM | POA: Insufficient documentation

## 2013-04-24 DIAGNOSIS — B9689 Other specified bacterial agents as the cause of diseases classified elsewhere: Secondary | ICD-10-CM | POA: Insufficient documentation

## 2013-04-24 LAB — URINALYSIS, ROUTINE W REFLEX MICROSCOPIC
Bilirubin Urine: NEGATIVE
Glucose, UA: NEGATIVE mg/dL
Ketones, ur: NEGATIVE mg/dL
Leukocytes, UA: NEGATIVE
Nitrite: NEGATIVE
Protein, ur: NEGATIVE mg/dL
Specific Gravity, Urine: <1.005 — ABNORMAL LOW (ref 1.005–1.030)
Urobilinogen, UA: 0.2 mg/dL (ref 0.0–1.0)
pH: 6 (ref 5.0–8.0)

## 2013-04-24 LAB — WET PREP, GENITAL
Trich, Wet Prep: NONE SEEN
Yeast Wet Prep HPF POC: NONE SEEN

## 2013-04-24 LAB — URINE MICROSCOPIC-ADD ON

## 2013-04-24 LAB — HIV ANTIBODY (ROUTINE TESTING W REFLEX): HIV: NONREACTIVE

## 2013-04-24 LAB — RPR: RPR Ser Ql: NONREACTIVE

## 2013-04-24 MED ORDER — METRONIDAZOLE 500 MG PO TABS: 500.0000 mg | ORAL_TABLET | Freq: Two times a day (BID) | ORAL | Status: DC

## 2013-04-24 NOTE — MAU Provider Note (Signed)
History     CSN: [id number]  Arrival date and time: 04/24/13 0901   None     Chief Complaint  Patient presents with  . Vaginal Discharge   HPI This is a 52 y.o. who presents with c/o vaginal discharge with odor for a year. Has not contacted her GYN, who is Dr Eda Paschal. Thought it would take too long to get in there. Upon questioning, it turns out she feels her partner has been unfaithful and is worried about STDs.  States her health has not been good and her fibromyalgia has flared up. Is under care for that, with new meds. Wants serum testing too, but states she has already been tested for Hepatitis. Has no pain. No bleeding. S/P hysterectomy years ago for DUB.  RN Note: Had partial hx in 08. Has noted a heavier d/c for past year. Has odor, no irritation or itching  OB History   Grav Para Term Preterm Abortions TAB SAB Ect Mult Living   4 2  2 2  1   2       Past Medical History  Diagnosis Date  . Fibromyalgia   . Cervical dystonia   . RA (rheumatoid arthritis)   . GERD (gastroesophageal reflux disease)   . Anxiety   . Depression   . History of shingles   . Synovitis of hand     Past Surgical History  Procedure Laterality Date  . Partial hysterectomy  2008  . Tonsillectomy    . Abdominal hysterectomy  3/08    partial    Family History  Problem Relation Age of Onset  . Heart failure Other   . Arthritis Mother   . Heart Problems Father   . Heart disease Father   . Heart disease Paternal Uncle     MI    History  Substance Use Topics  . Smoking status: Former Smoker -- 1.00 packs/day for 30 years    Types: Cigarettes    Quit date: 07/03/2012  . Smokeless tobacco: Never Used  . Alcohol Use: 0.0 oz/week     Comment: Rare    Allergies: No Known Allergies  Prescriptions prior to admission  Medication Sig Dispense Refill  . AMRIX 15 MG 24 hr capsule Take 15 mg by mouth as directed.      . clonazePAM (KLONOPIN) 0.5 MG tablet Take 1 tablet (0.5 mg total)  by mouth 2 (two) times daily as needed for anxiety.  60 tablet  5  . DULoxetine (CYMBALTA) 60 MG capsule Take 60 mg by mouth daily.      . folic acid (FOLVITE) 1 MG tablet Take 1 mg by mouth 2 (two) times daily.       Marland Kitchen inFLIXimab (REMICADE) 100 MG injection Inject 100 mg into the vein every 8 (eight) weeks.      . METHOTREXATE SODIUM, PF, IJ Inject 0.3 mLs as directed once a week.      . Multiple Vitamins-Minerals (MULTIVITAMINS THER. W/MINERALS) TABS Take 1 tablet by mouth daily.      Marland Kitchen omeprazole (PRILOSEC) 20 MG capsule TAKE (1) CAPSULE BY MOUTH EVERY DAY.  30 capsule  2  . PRESCRIPTION MEDICATION Take 1 tablet by mouth every evening. Prescription Sleeping pill        Review of Systems  Constitutional: Positive for malaise/fatigue. Negative for fever.  Gastrointestinal: Negative for nausea, vomiting, abdominal pain, diarrhea and constipation.  Genitourinary: Negative for dysuria.       Vaginal discharge with odor   Musculoskeletal:  Positive for myalgias.  Neurological: Negative for dizziness.   Physical Exam   Blood pressure 125/70, pulse 97, temperature 98.3 F (36.8 C), temperature source Oral, resp. rate 18, height 5' 4.5" (1.638 m), weight 74.39 kg (164 lb).  Physical Exam  Constitutional: She is oriented to person, place, and time. She appears well-developed and well-nourished. No distress (but tearful).  Cardiovascular: Normal rate.   Respiratory: Effort normal.  GI: Soft. There is no tenderness. There is no rebound and no guarding.  Genitourinary: Uterus normal. Vaginal discharge (thin white, +whiff) found.  Musculoskeletal: Normal range of motion.  Neurological: She is alert and oriented to person, place, and time.  Skin: Skin is warm and dry.  Psychiatric: She has a normal mood and affect.    MAU Course  Procedures  MDM Cultures, wet prep, and serum testing done.   Results for orders placed during the hospital encounter of 04/24/13 (from the past 72 hour(s))   URINALYSIS, ROUTINE W REFLEX MICROSCOPIC     Status: Abnormal   Collection Time    04/24/13  9:20 AM      Result Value Range   Color, Urine YELLOW  YELLOW   APPearance CLEAR  CLEAR   Specific Gravity, Urine <1.005 (*) 1.005 - 1.030   pH 6.0  5.0 - 8.0   Glucose, UA NEGATIVE  NEGATIVE mg/dL   Hgb urine dipstick MODERATE (*) NEGATIVE   Bilirubin Urine NEGATIVE  NEGATIVE   Ketones, ur NEGATIVE  NEGATIVE mg/dL   Protein, ur NEGATIVE  NEGATIVE mg/dL   Urobilinogen, UA 0.2  0.0 - 1.0 mg/dL   Nitrite NEGATIVE  NEGATIVE   Leukocytes, UA NEGATIVE  NEGATIVE  URINE MICROSCOPIC-ADD ON     Status: Abnormal   Collection Time    04/24/13  9:20 AM      Result Value Range   Squamous Epithelial / LPF FEW (*) RARE   RBC / HPF 0-2  <3 RBC/hpf   Bacteria, UA RARE  RARE  GC/CHLAMYDIA PROBE AMP     Status: None   Collection Time    04/24/13  9:45 AM      Result Value Range   CT Probe RNA NEGATIVE  NEGATIVE   GC Probe RNA NEGATIVE  NEGATIVE   Comment: (NOTE)                                                                                             Normal Reference Range: Negative          Assay performed using the Gen-Probe APTIMA COMBO2 (R) Assay.     Acceptable specimen types for this assay include APTIMA Swabs (Unisex,     endocervical, urethral, or vaginal), first void urine, and ThinPrep     liquid based cytology samples.     Performed at [facility]  WET PREP, GENITAL     Status: Abnormal   Collection Time    04/24/13  9:45 AM      Result Value Range   Yeast Wet Prep HPF POC NONE SEEN  NONE SEEN   Trich, Wet Prep NONE SEEN  NONE SEEN  Clue Cells Wet Prep HPF POC FEW (*) NONE SEEN   WBC, Wet Prep HPF POC FEW (*) NONE SEEN   Comment: MODERATE BACTERIA SEEN  HIV ANTIBODY (ROUTINE TESTING)     Status: None   Collection Time    04/24/13 10:15 AM      Result Value Range   HIV NON REACTIVE  NON REACTIVE   Comment: Performed at [facility]  RPR     Status: None    Collection Time    04/24/13 10:15 AM      Result Value Range   RPR NON REACTIVE  NON REACTIVE   Comment: Performed at [facility] and Plan  A:   Possible STD exposure due to infidelity       Mild BV     P:  Discharge home         Medication List         AMRIX 15 MG 24 hr capsule  Generic drug:  cyclobenzaprine  Take 15 mg by mouth as directed.     clonazePAM 0.5 MG tablet  Commonly known as:  KLONOPIN  Take 1 tablet (0.5 mg total) by mouth 2 (two) times daily as needed for anxiety.     DULoxetine 60 MG capsule  Commonly known as:  CYMBALTA  Take 60 mg by mouth daily.     folic acid 1 MG tablet  Commonly known as:  FOLVITE  Take 1 mg by mouth 2 (two) times daily.     inFLIXimab 100 MG injection  Commonly known as:  REMICADE  Inject 100 mg into the vein every 8 (eight) weeks.     METHOTREXATE SODIUM (PF) IJ  Inject 0.3 mLs as directed once a week.     metroNIDAZOLE 500 MG tablet  Commonly known as:  FLAGYL  Take 1 tablet (500 mg total) by mouth 2 (two) times daily.     multivitamins ther. w/minerals Tabs tablet  Take 1 tablet by mouth daily.     omeprazole 20 MG capsule  Commonly known as:  PRILOSEC  TAKE (1) CAPSULE BY MOUTH EVERY DAY.     traMADol 50 MG tablet  Commonly known as:  ULTRAM  Take 50 mg by mouth every 6 (six) hours as needed for pain.           Results reviewed      Followup in office   [facility] 04/24/2013, 9:25 AM

## 2013-04-24 NOTE — MAU Note (Signed)
Had partial hx in 08.  Has noted a heavier d/c for past year. Has odor, no irritation or itching.

## 2013-04-25 LAB — GC/CHLAMYDIA PROBE AMP
CT Probe RNA: NEGATIVE
GC Probe RNA: NEGATIVE

## 2013-04-27 NOTE — MAU Provider Note (Signed)
Note reviewed.   I was the on call GYN physician on this date.  Conley Simmonds, MD

## 2013-05-26 ENCOUNTER — Encounter: Payer: Self-pay | Admitting: Family Medicine

## 2013-05-26 ENCOUNTER — Other Ambulatory Visit: Payer: Self-pay | Admitting: Physician Assistant

## 2013-05-26 NOTE — Telephone Encounter (Signed)
Medication refill for one time only.  Patient needs to be seen.  Letter sent for patient to call and schedule 

## 2013-06-12 ENCOUNTER — Encounter: Payer: Self-pay | Admitting: Neurology

## 2013-06-12 ENCOUNTER — Ambulatory Visit (INDEPENDENT_AMBULATORY_CARE_PROVIDER_SITE_OTHER): Payer: 59 | Admitting: Neurology

## 2013-06-12 VITALS — BP 114/75 | HR 91 | Ht 65.0 in | Wt 166.0 lb

## 2013-06-12 DIAGNOSIS — G243 Spasmodic torticollis: Secondary | ICD-10-CM

## 2013-06-12 MED ORDER — ABOBOTULINUMTOXINA 500 UNITS IM SOLR
500.0000 [IU] | Freq: Once | INTRAMUSCULAR | Status: AC
  Administered 2013-06-12: 500 [IU] via INTRAMUSCULAR

## 2013-06-12 NOTE — Progress Notes (Signed)
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  Symptom onset was without apparent triggering event, she has gradually developed this neck pulling,  manifested primarily with right laterocollis, mild retrocolis, and left shoulder elevation since 2000. She has good relief with regular Botox injection, about 4 times a year since 2003. Last injection was February 18, 2010 by Dr. Deneen Harts,  She reported great relief as usual, taking 4- 5 days to get symptom relieve, lasting about 3 months.  I began to inject her since 12.2011, every 3-4 months, dosage has been limited to 150 units of BOTOX A.  She denies neck pain, gait difficulty, but with wearing off Botox, she noticed increased head tremor, and pulling towards her right shoulder.  She developed  transient difficulty lifting her neck up when using BOTOX A 200 units previously, reponding well to decreased dose of 150 units  She was enrolled into IPSEN dysport study, first injection was in April 30th 2013, she later enrolled into open label, 2nd injection was in  May 28th 2013. She did very well.  Recent few weeks,  She had experienced flareup of her rheumatoid arthritis, has received IV infusion. Last injection was in 06/10/2012,  500 units of dysport was dissolved into 2 cc of normal saline. Right longissimus capitus 0.5 cc Right splenius capitis 0.5 cc Right levator scapular 0.5 cc Right iliocostalis 0.5 cc   Dysport research study has completed, she is now coming back for repeat injection, she noticed head bobbing over the past few weeks, she denies significant neck pain, weakness,  Her rheumatoid arthritis is under better control   UPDATE 12/11 2014: Last injection was in Sep 2014, she did very well, no neck extension weakness, only rarely neck shaking  PHYSICAL EXAMINATOINS:  Generalized: In no acute distress  Neck: Supple, no carotid bruits   Cardiac: Regular rate  rhythm  Pulmonary: Clear to auscultation bilaterally  Musculoskeletal: No deformity  Neurologic Exam  Mental Status: pleasant awake, alert, cooperative to history, talking, and casual conversation. she has occasional No-No head shaking. She has mild right neck tilt, slight left ward rotation, slight retrocolis, and left shoulder elevation, slight right lateral shift. Cranial Nerves: CN II-XII pupils were equal round reactive to light.  Extraocular movements were full.  Visual fields were full on confrontational test.  Facial sensation and strength were normal.  Hearing was intact to finger rubbing bilaterally.  Uvula tongue were midline.  Head turning and shoulder shrugging were normal and symmetric.  Tongue protrusion into the cheeks strength were normal.  Motor: Normal tone, bulk, and strength. Sensory: Normal to light touch  Coordination:  There was no dysmetria noticed. Gait and Station: Narrow based and steady  Reflexes: Deep tendon reflexes symmetric,  Assessment and plan, Long-standing history of cervical dystonia, responded very well to botulinum toxin injection.  500 units of dysport was dissolved into 2 cc of [place]. (lot [id number]  exp [id number])  Right longissimus 0. 5 Right splenius capitis 0.2 5  Right iliocostalis 0.2 5 Right semispinalis capitus 0.25    Left splenius capitis 0.2 5 Left splenius cervix 0.2 5 Left oblique capitis inferior 0.25   She tolerated the injection well, will return to clinic in 3 months for repeat injection

## 2013-06-18 ENCOUNTER — Encounter: Payer: Self-pay | Admitting: Women's Health

## 2013-06-18 ENCOUNTER — Ambulatory Visit (INDEPENDENT_AMBULATORY_CARE_PROVIDER_SITE_OTHER): Payer: 59 | Admitting: Women's Health

## 2013-06-18 VITALS — BP 118/70 | Ht 65.5 in | Wt 166.2 lb

## 2013-06-18 DIAGNOSIS — Z01419 Encounter for gynecological examination (general) (routine) without abnormal findings: Secondary | ICD-10-CM

## 2013-06-18 DIAGNOSIS — Z13220 Encounter for screening for lipoid disorders: Secondary | ICD-10-CM

## 2013-06-18 DIAGNOSIS — A499 Bacterial infection, unspecified: Secondary | ICD-10-CM

## 2013-06-18 DIAGNOSIS — N898 Other specified noninflammatory disorders of vagina: Secondary | ICD-10-CM

## 2013-06-18 DIAGNOSIS — Z833 Family history of diabetes mellitus: Secondary | ICD-10-CM

## 2013-06-18 DIAGNOSIS — B9689 Other specified bacterial agents as the cause of diseases classified elsewhere: Secondary | ICD-10-CM

## 2013-06-18 DIAGNOSIS — N760 Acute vaginitis: Secondary | ICD-10-CM

## 2013-06-18 DIAGNOSIS — N949 Unspecified condition associated with female genital organs and menstrual cycle: Secondary | ICD-10-CM

## 2013-06-18 DIAGNOSIS — Z780 Asymptomatic menopausal state: Secondary | ICD-10-CM

## 2013-06-18 LAB — LIPID PANEL
Cholesterol: 220 mg/dL — ABNORMAL HIGH (ref 0–200)
HDL: 52 mg/dL (ref >39)
LDL Cholesterol: 147 mg/dL — ABNORMAL HIGH (ref 0–99)
Total CHOL/HDL Ratio: 4.2 Ratio
Triglycerides: 107 mg/dL (ref <150)
VLDL: 21 mg/dL (ref 0–40)

## 2013-06-18 LAB — WET PREP FOR TRICH, YEAST, CLUE
Trich, Wet Prep: NONE SEEN
Yeast Wet Prep HPF POC: NONE SEEN

## 2013-06-18 LAB — GLUCOSE, RANDOM: Glucose, Bld: 110 mg/dL — ABNORMAL HIGH (ref 70–99)

## 2013-06-18 MED ORDER — METRONIDAZOLE 0.75 % VA GEL: VAGINAL | Status: DC

## 2013-06-18 NOTE — Patient Instructions (Signed)
Health Recommendations for Postmenopausal Women Respected and ongoing research has looked at the most common causes of death, disability, and poor quality of life in postmenopausal women. The causes include heart disease, diseases of blood vessels, diabetes, depression, cancer, and bone loss (osteoporosis). Many things can be done to help lower the chances of developing these and other common problems: CARDIOVASCULAR DISEASE Heart Disease: A heart attack is a medical emergency. Know the signs and symptoms of a heart attack. Below are things women can do to reduce their risk for heart disease.   Do not smoke. If you smoke, quit.  Aim for a healthy weight. Being overweight causes many preventable deaths. Eat a healthy and balanced diet and drink an adequate amount of liquids.  Get moving. Make a commitment to be more physically active. Aim for 30 minutes of activity on most, if not all days of the week.  Eat for heart health. Choose a diet that is low in saturated fat and cholesterol and eliminate trans fat. Include whole grains, vegetables, and fruits. Read and understand the labels on food containers before buying.  Know your numbers. Ask your caregiver to check your blood pressure, cholesterol (total, HDL, LDL, triglycerides) and blood glucose. Work with your caregiver on improving your entire clinical picture.  High blood pressure. Limit or stop your table salt intake (try salt substitute and food seasonings). Avoid salty foods and drinks. Read labels on food containers before buying. Eating well and exercising can help control high blood pressure. STROKE  Stroke is a medical emergency. Stroke may be the result of a blood clot in a blood vessel in the brain or by a brain hemorrhage (bleeding). Know the signs and symptoms of a stroke. To lower the risk of developing a stroke:  Avoid fatty foods.  Quit smoking.  Control your diabetes, blood pressure, and irregular heart rate. THROMBOPHLEBITIS  (BLOOD CLOT) OF THE LEG  Becoming overweight and leading a stationary lifestyle may also contribute to developing blood clots. Controlling your diet and exercising will help lower the risk of developing blood clots. CANCER SCREENING  Breast Cancer: Take steps to reduce your risk of breast cancer.  You should practice "breast self-awareness." This means understanding the normal appearance and feel of your breasts and should include breast self-examination. Any changes detected, no matter how small, should be reported to your caregiver.  After age 40, you should have a clinical breast exam (CBE) every year.  Starting at age 40, you should consider having a mammogram (breast X-ray) every year.  If you have a family history of breast cancer, talk to your caregiver about genetic screening.  If you are at high risk for breast cancer, talk to your caregiver about having an MRI and a mammogram every year.  Intestinal or Stomach Cancer: Tests to consider are a rectal exam, fecal occult blood, sigmoidoscopy, and colonoscopy. Women who are high risk may need to be screened at an earlier age and more often.  Cervical Cancer:  Beginning at age 30, you should have a Pap test every 3 years as long as the past 3 Pap tests have been normal.  If you have had past treatment for cervical cancer or a condition that could lead to cancer, you need Pap tests and screening for cancer for at least 20 years after your treatment.  If you had a hysterectomy for a problem that was not cancer or a condition that could lead to cancer, then you no longer need Pap tests.    If you are between ages 65 and 70, and you have had normal Pap tests going back 10 years, you no longer need Pap tests.  If Pap tests have been discontinued, risk factors (such as a new sexual partner) need to be reassessed to determine if screening should be resumed.  Some medical problems can increase the chance of getting cervical cancer. In these  cases, your caregiver may recommend more frequent screening and Pap tests.  Uterine Cancer: If you have vaginal bleeding after reaching menopause, you should notify your caregiver.  Ovarian cancer: Other than yearly pelvic exams, there are no reliable tests available to screen for ovarian cancer at this time except for yearly pelvic exams.  Lung Cancer: Yearly chest X-rays can detect lung cancer and should be done on high risk women, such as cigarette smokers and women with chronic lung disease (emphysema).  Skin Cancer: A complete body skin exam should be done at your yearly examination. Avoid overexposure to the sun and ultraviolet light lamps. Use a strong sun block cream when in the sun. All of these things are important in lowering the risk of skin cancer. MENOPAUSE Menopause Symptoms: Hormone therapy products are effective for treating symptoms associated with menopause:  Moderate to severe hot flashes.  Night sweats.  Mood swings.  Headaches.  Tiredness.  Loss of sex drive.  Insomnia.  Other symptoms. Hormone replacement carries certain risks, especially in older women. Women who use or are thinking about using estrogen or estrogen with progestin treatments should discuss that with their caregiver. Your caregiver will help you understand the benefits and risks. The ideal dose of hormone replacement therapy is not known. The Food and Drug Administration (FDA) has concluded that hormone therapy should be used only at the lowest doses and for the shortest amount of time to reach treatment goals.  OSTEOPOROSIS Protecting Against Bone Loss and Preventing Fracture: If you use hormone therapy for prevention of bone loss (osteoporosis), the risks for bone loss must outweigh the risk of the therapy. Ask your caregiver about other medications known to be safe and effective for preventing bone loss and fractures. To guard against bone loss or fractures, the following is recommended:  If  you are less than age 50, take 1000 mg of calcium and at least 600 mg of Vitamin D per day.  If you are greater than age 50 but less than age 70, take 1200 mg of calcium and at least 600 mg of Vitamin D per day.  If you are greater than age 70, take 1200 mg of calcium and at least 800 mg of Vitamin D per day. Smoking and excessive alcohol intake increases the risk of osteoporosis. Eat foods rich in calcium and vitamin D and do weight bearing exercises several times a week as your caregiver suggests. DIABETES Diabetes Melitus: If you have Type I or Type 2 diabetes, you should keep your blood sugar under control with diet, exercise and recommended medication. Avoid too many sweets, starchy and fatty foods. Being overweight can make control more difficult. COGNITION AND MEMORY Cognition and Memory: Menopausal hormone therapy is not recommended for the prevention of cognitive disorders such as Alzheimer's disease or memory loss.  DEPRESSION  Depression may occur at any age, but is common in elderly women. The reasons may be because of physical, medical, social (loneliness), or financial problems and needs. If you are experiencing depression because of medical problems and control of symptoms, talk to your caregiver about this. Physical activity and   exercise may help with mood and sleep. Community and volunteer involvement may help your sense of value and worth. If you have depression and you feel that the problem is getting worse or becoming severe, talk to your caregiver about treatment options that are best for you. ACCIDENTS  Accidents are common and can be serious in the elderly woman. Prepare your house to prevent accidents. Eliminate throw rugs, place hand bars in the bath, shower and toilet areas. Avoid wearing high heeled shoes or walking on wet, snowy, and icy areas. Limit or stop driving if you have vision or hearing problems, or you feel you are unsteady with you movements and  reflexes. HEPATITIS C Hepatitis C is a type of viral infection affecting the liver. It is spread mainly through contact with blood from an infected person. It can be treated, but if left untreated, it can lead to severe liver damage over years. Many people who are infected do not know that the virus is in their blood. If you are a "baby-boomer", it is recommended that you have one screening test for Hepatitis C. IMMUNIZATIONS  Several immunizations are important to consider having during your senior years, including:   Tetanus, diptheria, and pertussis booster shot.  Influenza every year before the flu season begins.  Pneumonia vaccine.  Shingles vaccine.  Others as indicated based on your specific needs. Talk to your caregiver about these. Document Released: 08/11/2005 Document Revised: 06/05/2012 Document Reviewed: 04/06/2008 Exit[employer] Patient Information 2014 [employer].  

## 2013-06-18 NOTE — Progress Notes (Signed)
DORIANNA MCKIVER 1961/03/08 [id number]    History:    The patient presents for annual exam.  TAH 2004 for pelvic pain/adenomyosis/no HRT.  Abnormal Pap in her 20s, normal after. Last GYN exam 2010 has had more problems with arthritis and fibromyalgia has been seen by Dr. Corliss Skains. Normal mammograms, last mammogram 2010. Negative colonoscopy 2011. Benign microscopic hematuria with a negative workup in the past. Treated for bacterial vaginosis with Flagyl in October and states thinks has again.  Past medical history, past surgical history, family history and social history were all reviewed and documented in the EPIC chart. 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. Mother arthritis. Father heart disease/diabetes/hypertension.  ROS:  A  ROS was performed and pertinent positives and negatives are included in the history.  Exam:  Filed Vitals:   06/18/13 1512  BP: 118/70    General appearance:  Normal Head/Neck:  Normal, without cervical or supraclavicular adenopathy. Thyroid:  Symmetrical, normal in size, without palpable masses or nodularity. Respiratory  Effort:  Normal  Auscultation:  Clear without wheezing or rhonchi Cardiovascular  Auscultation:  Regular rate, without rubs, murmurs or gallops  Edema/varicosities:  Not grossly evident Abdominal  Soft,nontender, without masses, guarding or rebound.  Liver/spleen:  No organomegaly noted  Hernia:  None appreciated  Skin  Inspection:  Grossly normal  Palpation:  Grossly normal Neurologic/psychiatric  Orientation:  Normal with appropriate conversation.  Mood/affect:  Normal  Genitourinary    Breasts: Examined lying and sitting.     Right: Without masses, retractions, discharge or axillary adenopathy.     Left: Without masses, retractions, discharge or axillary adenopathy.   Inguinal/mons:  Normal without inguinal adenopathy  External genitalia:  Normal  BUS/Urethra/Skene's glands:  Normal  Bladder:  Normal  Vagina: White  adherent discharge positive for amines, clues,  TNTC bacteria  Cervix:  Absent Uterus:  Absent  Adnexa/parametria:     Rt: Without masses or tenderness.   Lt: Without masses or tenderness.  Anus and perineum: Normal  Digital rectal exam: Normal sphincter tone without palpated masses or tenderness  Assessment/Plan:  52 y.o. MWF G4P2 for annual exam.    Bacteria vaginosis TAH no HRT Arthritis/fibromyalgia -primary care manages meds and some labs Benign hematuria  Plan: MetroGel vaginal cream 1 applicator at bedtime x5 and then weekly for several weeks to prevent recurrence. Instructed to call if no relief. Alcohol precautions reviewed. SBE's, annual mammogram, reviewed importance of annual screen instructed to schedule. DEXA will schedule. Reviewed importance of regular exercise, calcium rich diet, vitamin D 2000 daily. Glucose, lipid panel, UA. New screening guidelines for Paps reviewed.    Harrington Challenger [facility], 4:23 PM 06/18/2013

## 2013-06-19 ENCOUNTER — Other Ambulatory Visit: Payer: Self-pay | Admitting: Women's Health

## 2013-06-19 DIAGNOSIS — E7800 Pure hypercholesterolemia, unspecified: Secondary | ICD-10-CM

## 2013-06-19 DIAGNOSIS — R7309 Other abnormal glucose: Secondary | ICD-10-CM

## 2013-06-19 LAB — URINALYSIS W MICROSCOPIC + REFLEX CULTURE
Bacteria, UA: NONE SEEN
Bilirubin Urine: NEGATIVE
Casts: NONE SEEN
Crystals: NONE SEEN
Glucose, UA: NEGATIVE mg/dL
Ketones, ur: NEGATIVE mg/dL
Leukocytes, UA: NEGATIVE
Nitrite: NEGATIVE
Protein, ur: NEGATIVE mg/dL
Specific Gravity, Urine: 1.006 (ref 1.005–1.030)
Urobilinogen, UA: 0.2 mg/dL (ref 0.0–1.0)
pH: 5.5 (ref 5.0–8.0)

## 2013-06-25 ENCOUNTER — Encounter: Payer: Self-pay | Admitting: Obstetrics and Gynecology

## 2013-07-08 ENCOUNTER — Other Ambulatory Visit: Payer: Self-pay | Admitting: Women's Health

## 2013-07-08 DIAGNOSIS — Z1231 Encounter for screening mammogram for malignant neoplasm of breast: Secondary | ICD-10-CM

## 2013-07-14 ENCOUNTER — Other Ambulatory Visit: Payer: Self-pay | Admitting: Gynecology

## 2013-07-14 DIAGNOSIS — Z780 Asymptomatic menopausal state: Secondary | ICD-10-CM

## 2013-07-15 ENCOUNTER — Encounter: Payer: Self-pay | Admitting: Physician Assistant

## 2013-07-15 ENCOUNTER — Ambulatory Visit (INDEPENDENT_AMBULATORY_CARE_PROVIDER_SITE_OTHER): Payer: 59 | Admitting: Physician Assistant

## 2013-07-15 VITALS — BP 114/72 | HR 88 | Temp 98.2°F | Resp 18 | Ht 64.5 in | Wt 168.0 lb

## 2013-07-15 DIAGNOSIS — M65839 Other synovitis and tenosynovitis, unspecified forearm: Secondary | ICD-10-CM

## 2013-07-15 DIAGNOSIS — M069 Rheumatoid arthritis, unspecified: Secondary | ICD-10-CM

## 2013-07-15 DIAGNOSIS — M65849 Other synovitis and tenosynovitis, unspecified hand: Secondary | ICD-10-CM

## 2013-07-15 DIAGNOSIS — M659 Synovitis and tenosynovitis, unspecified: Secondary | ICD-10-CM

## 2013-07-15 DIAGNOSIS — F419 Anxiety disorder, unspecified: Secondary | ICD-10-CM

## 2013-07-15 DIAGNOSIS — F329 Major depressive disorder, single episode, unspecified: Secondary | ICD-10-CM

## 2013-07-15 DIAGNOSIS — F411 Generalized anxiety disorder: Secondary | ICD-10-CM

## 2013-07-15 DIAGNOSIS — K219 Gastro-esophageal reflux disease without esophagitis: Secondary | ICD-10-CM

## 2013-07-15 DIAGNOSIS — F32A Depression, unspecified: Secondary | ICD-10-CM

## 2013-07-15 DIAGNOSIS — G243 Spasmodic torticollis: Secondary | ICD-10-CM

## 2013-07-15 DIAGNOSIS — F3289 Other specified depressive episodes: Secondary | ICD-10-CM

## 2013-07-15 MED ORDER — OMEPRAZOLE 20 MG PO CPDR: DELAYED_RELEASE_CAPSULE | ORAL | Status: DC

## 2013-07-15 NOTE — Progress Notes (Signed)
Patient ID: Courtney Myers MRN: [id number], DOB: 1961-04-27, 53 y.o. Date of Encounter: @DATE @  Chief Complaint:  Chief Complaint  Patient presents with  . 6 mth check up/med    is fasting    HPI: 53 y.o. year old white female  presents for routine followup visit.  Her last office visit with me was actually back in November 2013. At that visit she was actually here with both her husband and her daughter. At that time she was experiencing significant amount of pain. At that time she was seeing rheumatologist Dr. Ouida Sills. Had been diagnosed with rheumatoid arthritis. Also with fibromyalgia. At that visit she actually started to cry. Added Cymbalta at that visit. Today she tells me that she is so grateful for me adding the Cymbalta. She says that up until that visit the prior months she was feeling severely depressed. She says that the Cymbalta has made such a difference in her life. Says that she has felt so much better with this medication. Has greatly helped her anxiety and depression. She also says that it did help with her pain as well. Also at that visit in November 2013 she was very frustrated with Dr. Ouida Sills and was frustrated with the pain that she was experiencing. I referred her to Dr. Estanislado Pandy. She says that Dr. Estanislado Pandy has also been an amazingly helpful. She says that she is feeling so much better. She says that Dr. Estanislado Pandy has actually been refilling her Cymbalta for her. The only medication that we have currently prescribing as her omeprazole. Is that she is basically here because she needs a refill on his omeprazole.  Says that she has to take this medication daily. If she goes a day without the medication she has significant reflux symptoms. However, as long as she does take the medication daily, her symptoms are completely controlled.  She also sees [facility] and they do Botox injections for cervical dystonia. I reviewed their notes. They state that she's  actually had this problem since the year 2000. She has been getting Botox injections every 3-4 months since December 2011.  She also sees her gynecologist who is also on Epic. I was able to print and reviewed her last office note as well as labs.  They are managing her preventive care. She has mammogram scheduled for this upcoming week. Their note states that she had negative colonoscopy 2011. Their note also states that she has had benign microscopic hematuria with a negative workup. At their recent visit 06/2013 they scheduled her for a DEXA scan which patient has scheduled upcoming. Also at that visit 06/2013 they did labs including lipid panel. Fasting glucose was slightly elevated at 110. Total cholesterol was slightly elevated with an LDL of 147. She was to make diet and exercise changes and recheck these labs in 3-4 months. GYN is following this.   Past Medical History  Diagnosis Date  . Fibromyalgia   . Cervical dystonia   . RA (rheumatoid arthritis)   . GERD (gastroesophageal reflux disease)   . Anxiety   . Depression   . History of shingles   . Synovitis of hand   . Cervical dystonia      Home Meds: See attached medication section for current medication list. Any medications entered into computer today will not appear on this note's list. The medications listed below were entered prior to today. Current Outpatient Prescriptions on File Prior to Visit  Medication Sig Dispense Refill  . AbobotulinumtoxinA (DYSPORT  IM) Inject into the muscle.      . AMRIX 15 MG 24 hr capsule Take 15 mg by mouth as directed.      . baclofen (LIORESAL) 10 MG tablet Take 10 mg by mouth daily as needed.       . clonazePAM (KLONOPIN) 0.5 MG tablet Take 1 tablet (0.5 mg total) by mouth 2 (two) times daily as needed for anxiety.  60 tablet  5  . DULoxetine (CYMBALTA) 60 MG capsule Take 60 mg by mouth daily.      . folic acid (FOLVITE) 1 MG tablet Take 1 mg by mouth 2 (two) times daily.       Marland Kitchen inFLIXimab  (REMICADE) 100 MG injection Inject 100 mg into the vein every 8 (eight) weeks.      . METHOTREXATE SODIUM, PF, IJ Inject 0.3 mLs as directed once a week.      . Multiple Vitamins-Minerals (MULTIVITAMINS THER. W/MINERALS) TABS Take 1 tablet by mouth daily.      . traMADol (ULTRAM) 50 MG tablet Take 50 mg by mouth every 6 (six) hours as needed for pain.       No current facility-administered medications on file prior to visit.    Allergies: No Known Allergies  History   Social History  . Marital Status: Married    Spouse Name: [name]    Number of Children: 2  . Years of Education: 12   Occupational History  .  [place] History Main Topics  . Smoking status: Former Smoker -- 1.00 packs/day for 30 years    Types: Cigarettes    Quit date: 07/03/2012  . Smokeless tobacco: Never Used  . Alcohol Use: No     Comment: Rare  . Drug Use: No  . Sexual Activity: Yes    Birth Control/ Protection: Surgical   Other Topics Concern  . 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 Right handed.    Caffeine- 4 cups daily.    Family History  Problem Relation Age of Onset  . Heart failure Other   . Arthritis Mother   . Heart Problems Father   . Heart disease Father   . Heart disease Paternal Uncle     MI     Review of Systems:  See HPI for pertinent ROS. All other ROS negative.    Physical Exam: Blood pressure 114/72, pulse 88, temperature 98.2 F (36.8 C), temperature source Oral, resp. rate 18, height 5' 4.5" (1.638 m), weight 168 lb (76.204 kg)., Body mass index is 28.4 kg/(m^2). General: WNWD WF. Appears in no acute distress. Neck: Supple. No thyromegaly. No lymphadenopathy. Lungs: Clear bilaterally to auscultation without wheezes, rales, or rhonchi. Breathing is unlabored. Heart: RRR with S1 S2. No murmurs, rubs, or gallops. Abdomen: Soft,  non-tender, non-distended with normoactive bowel sounds. No hepatomegaly. No rebound/guarding. No obvious abdominal masses. Musculoskeletal:  Strength and tone normal for age. Extremities/Skin: Warm and dry. No clubbing or cyanosis. No edema. No rashes or suspicious lesions. Neuro: Alert and oriented X 3. Moves all extremities spontaneously. Gait is normal. CNII-XII grossly in tact. Psych:  Responds to questions appropriately with a normal affect.     ASSESSMENT AND PLAN:  54 y.o. year old female with  1. Anxiety: Controlled with Cymbalta. Dr. Estanislado Pandy actually now prescribing this for her.  2. Cervical dystonia Per Neurology. They do  Botox injections on a routine basis every 3-4 months.  3. Depression Controlled with Cymbalta. Dr. Estanislado Pandy is now prescribing this.  4. GERD (gastroesophageal reflux disease) - omeprazole (PRILOSEC) 20 MG capsule; TAKE (1) CAPSULE BY MOUTH EVERY DAY.  Dispense: 30 capsule; Refill: 11  5. RA (rheumatoid arthritis) Per Dr. Estanislado Pandy  6. Synovitis of hand Per Dr. Dora Sims  Hyperglycemia: GYN is managing this. The patient is making diet and exercise changes and is to recheck in 3-4 months with gynecology.  Hyperlipidemia: GYN is managing this. Patient is making diet exercise changes and is to recheck in 3-4 months with gynecology.  I've explained to the patient's parents we can manage hyperglycemia and hyperlipidemia here but his longest gynecology is managing this and she can continue to follow this up with them.  Screening mammogram, DEXA scan are being managed by gynecology. As well gynecology note states the patient had normal colonoscopy 2011. He also reports that she has had microscopic hematuria with workup in the past which was negative.  I discussed with her that as long as gynecology is managing his preventive care and her sugar and cholesterol then she can wait one year to followup here. Otherwise can follow up here sooner if  needed.   Marin Olp [place], [place], [facility] 07/15/2013 8:29 AM

## 2013-07-17 ENCOUNTER — Ambulatory Visit (HOSPITAL_COMMUNITY)
Admission: RE | Admit: 2013-07-17 | Discharge: 2013-07-17 | Disposition: A | Discharge status: Home / Self Care | Payer: 59 | Source: Ambulatory Visit | Attending: Women's Health | Admitting: Women's Health

## 2013-07-17 DIAGNOSIS — Z1231 Encounter for screening mammogram for malignant neoplasm of breast: Secondary | ICD-10-CM | POA: Insufficient documentation

## 2013-07-18 ENCOUNTER — Other Ambulatory Visit: Payer: Self-pay | Admitting: Women's Health

## 2013-07-18 DIAGNOSIS — R928 Other abnormal and inconclusive findings on diagnostic imaging of breast: Secondary | ICD-10-CM

## 2013-07-24 ENCOUNTER — Ambulatory Visit
Admission: RE | Admit: 2013-07-24 | Discharge: 2013-07-24 | Disposition: A | Discharge status: Home / Self Care | Payer: 59 | Source: Ambulatory Visit | Attending: Women's Health | Admitting: Women's Health

## 2013-07-24 DIAGNOSIS — R928 Other abnormal and inconclusive findings on diagnostic imaging of breast: Secondary | ICD-10-CM

## 2013-09-02 ENCOUNTER — Other Ambulatory Visit: Payer: Self-pay | Admitting: Gynecology

## 2013-09-02 ENCOUNTER — Ambulatory Visit (INDEPENDENT_AMBULATORY_CARE_PROVIDER_SITE_OTHER): Payer: 59

## 2013-09-02 DIAGNOSIS — Z13820 Encounter for screening for osteoporosis: Secondary | ICD-10-CM

## 2013-09-02 DIAGNOSIS — Z780 Asymptomatic menopausal state: Secondary | ICD-10-CM

## 2013-09-10 ENCOUNTER — Ambulatory Visit: Payer: 59 | Admitting: Neurology

## 2013-10-06 ENCOUNTER — Encounter: Payer: Self-pay | Admitting: Family Medicine

## 2013-10-06 ENCOUNTER — Ambulatory Visit (INDEPENDENT_AMBULATORY_CARE_PROVIDER_SITE_OTHER): Payer: 59 | Admitting: Family Medicine

## 2013-10-06 VITALS — BP 100/64 | HR 100 | Temp 98.9°F | Resp 16 | Ht 66.0 in | Wt 170.0 lb

## 2013-10-06 DIAGNOSIS — J209 Acute bronchitis, unspecified: Secondary | ICD-10-CM

## 2013-10-06 MED ORDER — AZITHROMYCIN 250 MG PO TABS: ORAL_TABLET | ORAL | Status: DC

## 2013-10-06 NOTE — Progress Notes (Signed)
Subjective:    Patient ID: Courtney Myers, female    DOB: 1961-01-09, 53 y.o.   MRN: [id number]  HPI Patient has had a nonproductive cough for 5 days. She is having subjective fevers and chills. The cough is nonpurulent. She denies any green sputum or hemoptysis. She denies any chest pain. She does state that it feels like her airways are tight at times. She has no history of allergies or asthma. She does have a remote history of smoking although she has quit. She denies any chest pain or pleurisy. She denies any shortness of breath. Past Medical History  Diagnosis Date  . Fibromyalgia   . Cervical dystonia   . RA (rheumatoid arthritis)   . GERD (gastroesophageal reflux disease)   . Anxiety   . Depression   . History of shingles   . Synovitis of hand   . Cervical dystonia    Current Outpatient Prescriptions on File Prior to Visit  Medication Sig Dispense Refill  . AbobotulinumtoxinA (DYSPORT IM) Inject into the muscle.      . AMRIX 15 MG 24 hr capsule Take 15 mg by mouth as directed.      . baclofen (LIORESAL) 10 MG tablet Take 10 mg by mouth daily as needed.       . clonazePAM (KLONOPIN) 0.5 MG tablet Take 1 tablet (0.5 mg total) by mouth 2 (two) times daily as needed for anxiety.  60 tablet  5  . DULoxetine (CYMBALTA) 60 MG capsule Take 60 mg by mouth daily.      . folic acid (FOLVITE) 1 MG tablet Take 1 mg by mouth 2 (two) times daily.       Marland Kitchen inFLIXimab (REMICADE) 100 MG injection Inject 100 mg into the vein every 8 (eight) weeks.      . METHOTREXATE SODIUM, PF, IJ Inject 0.3 mLs as directed once a week.      Marland Kitchen omeprazole (PRILOSEC) 20 MG capsule TAKE (1) CAPSULE BY MOUTH EVERY DAY.  30 capsule  11  . traMADol (ULTRAM) 50 MG tablet Take 50 mg by mouth every 6 (six) hours as needed for pain.      . Multiple Vitamins-Minerals (MULTIVITAMINS THER. W/MINERALS) TABS Take 1 tablet by mouth daily.       No current facility-administered medications on file prior to visit.   No Known  Allergies History   Social History  . Marital Status: Married    Spouse Name: [name]    Number of Children: 2  . Years of Education: 12   Occupational History  .  [place] History Main Topics  . Smoking status: Former Smoker -- 1.00 packs/day for 30 years    Types: Cigarettes    Quit date: 07/03/2012  . Smokeless tobacco: Never Used  . Alcohol Use: No     Comment: Rare  . Drug Use: No  . Sexual Activity: Yes    Birth Control/ Protection: Surgical   Other Topics Concern  . 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 Right handed.    Caffeine- 4 cups daily.      Review of Systems  All other systems reviewed and are negative.       Objective:   Physical Exam  Vitals reviewed. Constitutional: She appears well-developed and well-nourished. No distress.  HENT:  Right Ear: External ear normal.  Left Ear:  External ear normal.  Nose: Nose normal.  Mouth/Throat: Oropharynx is clear and moist. No oropharyngeal exudate.  Eyes: Conjunctivae are normal. Pupils are equal, round, and reactive to light. No scleral icterus.  Neck: Neck supple.  Cardiovascular: Normal rate, regular rhythm and normal heart sounds.   No murmur heard. Pulmonary/Chest: Effort normal and breath sounds normal. No respiratory distress. She has no wheezes. She has no rales.  Lymphadenopathy:    She has no cervical adenopathy.  Skin: She is not diaphoretic.          Assessment & Plan:  1. Acute bronchitis Patient has acute bronchitis. I recommended Mucinex DM over-the-counter to help cough and chest congestion. I explained to the patient this is likely a virus. I explained antibodies do not  help viruses. I recommended tincture of time for one week. Given her chronic immunosuppressive state I did give the patient prescription for a Z-Pak and explained when to begin  taking antibiotic if she develops high fever greater than 102, purulent sputum, or symptoms drastically worsen. - azithromycin (ZITHROMAX) 250 MG tablet; 2 tabs poqday1, 1 tab poqday 2-5  Dispense: 6 tablet; Refill: 0

## 2013-10-13 ENCOUNTER — Other Ambulatory Visit: Payer: Self-pay

## 2013-10-13 MED ORDER — CLONAZEPAM 0.5 MG PO TABS: 0.5000 mg | ORAL_TABLET | Freq: Two times a day (BID) | ORAL | Status: DC | PRN

## 2013-10-13 NOTE — Telephone Encounter (Signed)
Dr Krista Blue is out of the office.  Forwarding request to [facility].

## 2013-10-13 NOTE — Telephone Encounter (Signed)
Rx signed and faxed.

## 2013-12-18 ENCOUNTER — Ambulatory Visit (INDEPENDENT_AMBULATORY_CARE_PROVIDER_SITE_OTHER): Payer: 59 | Admitting: Physician Assistant

## 2013-12-18 ENCOUNTER — Encounter: Payer: Self-pay | Admitting: Physician Assistant

## 2013-12-18 VITALS — BP 100/58 | HR 76 | Temp 98.9°F | Resp 16 | Ht 66.0 in | Wt 174.0 lb

## 2013-12-18 DIAGNOSIS — B9689 Other specified bacterial agents as the cause of diseases classified elsewhere: Principal | ICD-10-CM

## 2013-12-18 DIAGNOSIS — A499 Bacterial infection, unspecified: Secondary | ICD-10-CM

## 2013-12-18 DIAGNOSIS — J988 Other specified respiratory disorders: Secondary | ICD-10-CM

## 2013-12-18 MED ORDER — AZITHROMYCIN 250 MG PO TABS: ORAL_TABLET | ORAL | Status: DC

## 2013-12-18 NOTE — Progress Notes (Signed)
Patient ID: Courtney Myers MRN: [id number], DOB: September 24, 1960, 53 y.o. Date of Encounter: 12/18/2013, 4:11 PM    Chief Complaint:  Chief Complaint  Patient presents with  . Chest congestion, cough     HPI: 53 y.o. year old white female that she has been sick for 8 days now. Says her husband has been sick with the same types of symptoms and recently went to the urgent care and was treated with antibiotics. Patient states that at the beginning of this ilness she had head / nasal congestion and sore throat but now these symptoms have completely resolved. Now she just has cough remaining. However has significant amount of cough. Has had no known fevers or chills.  Home Meds:   Outpatient Prescriptions Prior to Visit  Medication Sig Dispense Refill  . AbobotulinumtoxinA (DYSPORT IM) Inject into the muscle.      . AMRIX 15 MG 24 hr capsule Take 15 mg by mouth as directed.      . baclofen (LIORESAL) 10 MG tablet Take 10 mg by mouth daily as needed.       . clonazePAM (KLONOPIN) 0.5 MG tablet Take 1 tablet (0.5 mg total) by mouth 2 (two) times daily as needed for anxiety.  60 tablet  5  . DULoxetine (CYMBALTA) 60 MG capsule Take 60 mg by mouth daily.      . folic acid (FOLVITE) 1 MG tablet Take 1 mg by mouth 2 (two) times daily.       Marland Kitchen inFLIXimab (REMICADE) 100 MG injection Inject 100 mg into the vein every 8 (eight) weeks.      . METHOTREXATE SODIUM, PF, IJ Inject 0.3 mLs as directed once a week.      . Multiple Vitamins-Minerals (MULTIVITAMINS THER. W/MINERALS) TABS Take 1 tablet by mouth daily.      Marland Kitchen omeprazole (PRILOSEC) 20 MG capsule TAKE (1) CAPSULE BY MOUTH EVERY DAY.  30 capsule  11  . traMADol (ULTRAM) 50 MG tablet Take 50 mg by mouth every 6 (six) hours as needed for pain.      Marland Kitchen azithromycin (ZITHROMAX) 250 MG tablet 2 tabs poqday1, 1 tab poqday 2-5  6 tablet  0   No facility-administered medications prior to visit.    Allergies: No Known Allergies    Review of Systems: See  HPI for pertinent ROS. All other ROS negative.    Physical Exam: Blood pressure 100/58, pulse 76, temperature 98.9 F (37.2 C), temperature source Oral, resp. rate 16, height 5\' 6"  (1.676 m), weight 174 lb (78.926 kg)., Body mass index is 28.1 kg/(m^2). General:  WNWD WF. Appears in no acute distress. HEENT: Normocephalic, atraumatic, eyes without discharge, sclera non-icteric, nares are without discharge. Bilateral auditory canals clear, TM's are without perforation, pearly grey and translucent with reflective cone of light bilaterally. Oral cavity moist, posterior pharynx without exudate, erythema, peritonsillar abscess. No tenderness with percussion of frontal or maxillary sinuses bilaterally.  Neck: Supple. No thyromegaly. No lymphadenopathy. Lungs: Clear bilaterally to auscultation without wheezes, rales, or rhonchi. Breathing is unlabored. Heart: Regular rhythm. No murmurs, rubs, or gallops. Msk:  Strength and tone normal for age. Extremities/Skin: Warm and dry. Neuro: Alert and oriented X 3. Moves all extremities spontaneously. Gait is normal. CNII-XII grossly in tact. Psych:  Responds to questions appropriately with a normal affect.     ASSESSMENT AND PLAN:  53 y.o. year old female with  1. Bacterial respiratory infection - azithromycin (ZITHROMAX) 250 MG tablet; Day 1: Take 2  daily.  Days 2-5: Take 1 daily.  Dispense: 6 tablet; Refill: 0 Followup if symptoms do not resolve within one week after completion of antibiotic.  [address] [place], [place], [facility] 12/18/2013 4:11 PM

## 2014-02-11 ENCOUNTER — Other Ambulatory Visit: Payer: Self-pay | Admitting: Dermatology

## 2014-04-04 ENCOUNTER — Encounter: Payer: Self-pay | Admitting: Gastroenterology

## 2014-05-01 ENCOUNTER — Other Ambulatory Visit: Payer: Self-pay | Admitting: Neurology

## 2014-05-04 ENCOUNTER — Encounter: Payer: Self-pay | Admitting: Physician Assistant

## 2014-07-06 ENCOUNTER — Other Ambulatory Visit (HOSPITAL_COMMUNITY): Payer: Self-pay | Admitting: *Deleted

## 2014-07-07 ENCOUNTER — Encounter (HOSPITAL_COMMUNITY)
Admission: RE | Admit: 2014-07-07 | Discharge: 2014-07-07 | Disposition: A | Discharge status: Home / Self Care | Payer: 59 | Source: Ambulatory Visit | Attending: Rheumatology | Admitting: Rheumatology

## 2014-07-07 DIAGNOSIS — M069 Rheumatoid arthritis, unspecified: Secondary | ICD-10-CM | POA: Insufficient documentation

## 2014-07-07 MED ORDER — SODIUM CHLORIDE 0.9 % IV SOLN
INTRAVENOUS | Status: DC
  Administered 2014-07-07: 250 mL via INTRAVENOUS

## 2014-07-07 MED ORDER — INFLIXIMAB 100 MG IV SOLR
500.0000 mg | INTRAVENOUS | Status: DC
  Administered 2014-07-07: 500 mg via INTRAVENOUS
  Filled 2014-07-07: qty 50

## 2014-07-15 ENCOUNTER — Ambulatory Visit: Payer: 59 | Admitting: Physician Assistant

## 2014-07-20 ENCOUNTER — Other Ambulatory Visit: Payer: Self-pay | Admitting: Women's Health

## 2014-07-20 DIAGNOSIS — Z1231 Encounter for screening mammogram for malignant neoplasm of breast: Secondary | ICD-10-CM

## 2014-07-21 ENCOUNTER — Other Ambulatory Visit: Payer: Self-pay | Admitting: *Deleted

## 2014-07-21 DIAGNOSIS — K219 Gastro-esophageal reflux disease without esophagitis: Secondary | ICD-10-CM

## 2014-07-21 MED ORDER — OMEPRAZOLE 20 MG PO CPDR: DELAYED_RELEASE_CAPSULE | ORAL | Status: DC

## 2014-07-21 NOTE — Telephone Encounter (Signed)
Received fax requesting refill on Omeprazole.   Refill appropriate and filled per protocol.

## 2014-07-22 ENCOUNTER — Ambulatory Visit (HOSPITAL_COMMUNITY)
Admission: RE | Admit: 2014-07-22 | Discharge: 2014-07-22 | Disposition: A | Discharge status: Home / Self Care | Payer: 59 | Source: Ambulatory Visit | Attending: Women's Health | Admitting: Women's Health

## 2014-07-22 DIAGNOSIS — Z1231 Encounter for screening mammogram for malignant neoplasm of breast: Secondary | ICD-10-CM | POA: Diagnosis not present

## 2014-07-23 ENCOUNTER — Encounter: Payer: Self-pay | Admitting: Women's Health

## 2014-08-03 ENCOUNTER — Encounter: Payer: Self-pay | Admitting: Women's Health

## 2014-08-03 ENCOUNTER — Ambulatory Visit (INDEPENDENT_AMBULATORY_CARE_PROVIDER_SITE_OTHER): Payer: 59 | Admitting: Women's Health

## 2014-08-03 VITALS — BP 124/80 | Ht 66.0 in | Wt 175.0 lb

## 2014-08-03 DIAGNOSIS — N898 Other specified noninflammatory disorders of vagina: Secondary | ICD-10-CM

## 2014-08-03 DIAGNOSIS — Z01419 Encounter for gynecological examination (general) (routine) without abnormal findings: Secondary | ICD-10-CM

## 2014-08-03 LAB — WET PREP FOR TRICH, YEAST, CLUE
Clue Cells Wet Prep HPF POC: NONE SEEN
Trich, Wet Prep: NONE SEEN
WBC, Wet Prep HPF POC: NONE SEEN
Yeast Wet Prep HPF POC: NONE SEEN

## 2014-08-03 NOTE — Progress Notes (Signed)
Courtney Myers 09-07-1960 [id number]    History:    Presents for annual exam.  2004 TAH for adenomyosis on no HRT. Abnormal Pap in her 17s with normal Paps after. Rheumatoid arthritis on immunosuppressants. Fibromyalgia. Normal  mammogram history.  08/2013 DEXA T score hip 1.2. Negative colonoscopy 2011. History of benign hematuria.  Past medical history, past surgical history, family history and social history were all reviewed and documented in the EPIC chart. Works at [employer]. 2 children in their 79s both doing well. Mother osteoarthritis. Father hypertension, heart disease, diabetes.  ROS:  A ROS was performed and pertinent positives and negatives are included.  Exam:  Filed Vitals:   08/03/14 1106  BP: 124/80    General appearance:  Normal Thyroid:  Symmetrical, normal in size, without palpable masses or nodularity. Respiratory  Auscultation:  Clear without wheezing or rhonchi Cardiovascular  Auscultation:  Regular rate, without rubs, murmurs or gallops  Edema/varicosities:  Not grossly evident Abdominal  Soft,nontender, without masses, guarding or rebound.  Liver/spleen:  No organomegaly noted  Hernia:  None appreciated  Skin  Inspection:  Grossly normal   Breasts: Examined lying and sitting.     Right: Without masses, retractions, discharge or axillary adenopathy.     Left: Without masses, retractions, discharge or axillary adenopathy. Gentitourinary   Inguinal/mons:  Normal without inguinal adenopathy  External genitalia:  Normal  BUS/Urethra/Skene's glands:  Normal  Vagina:  Normal  Cervix: and Uterus absent  Adnexa/parametria:     Rt: Without masses or tenderness.   Lt: Without masses or tenderness.  Anus and perineum: Normal  Digital rectal exam: Normal sphincter tone without palpated masses or tenderness  Assessment/Plan:  54 y.o. MWF G4P2 for annual exam with no complaints.   2004 TAH for adenomyosis on no HRT Rheumatoid  arthritis-rheumatologist manages Labs primary care History of benign  Hematuria  Plan: SBE's, continue annual screening mammogram, calcium rich diet, vitamin D 1000 daily encouraged. Home safety, fall prevention and importance of regular exercise reviewed. UA, Pap screening guidelines reviewed. Recommended Pneumovax will discuss with primary care.  Courtney Myers [facility], 12:54 PM 08/03/2014

## 2014-08-03 NOTE — Patient Instructions (Signed)
Health Recommendations for Postmenopausal Women Respected and ongoing research has looked at the most common causes of death, disability, and poor quality of life in postmenopausal women. The causes include heart disease, diseases of blood vessels, diabetes, depression, cancer, and bone loss (osteoporosis). Many things can be done to help lower the chances of developing these and other common problems. CARDIOVASCULAR DISEASE Heart Disease: A heart attack is a medical emergency. Know the signs and symptoms of a heart attack. Below are things women can do to reduce their risk for heart disease.   Do not smoke. If you smoke, quit.  Aim for a healthy weight. Being overweight causes many preventable deaths. Eat a healthy and balanced diet and drink an adequate amount of liquids.  Get moving. Make a commitment to be more physically active. Aim for 30 minutes of activity on most, if not all days of the week.  Eat for heart health. Choose a diet that is low in saturated fat and cholesterol and eliminate trans fat. Include whole grains, vegetables, and fruits. Read and understand the labels on food containers before buying.  Know your numbers. Ask your caregiver to check your blood pressure, cholesterol (total, HDL, LDL, triglycerides) and blood glucose. Work with your caregiver on improving your entire clinical picture.  High blood pressure. Limit or stop your table salt intake (try salt substitute and food seasonings). Avoid salty foods and drinks. Read labels on food containers before buying. Eating well and exercising can help control high blood pressure. STROKE  Stroke is a medical emergency. Stroke may be the result of a blood clot in a blood vessel in the brain or by a brain hemorrhage (bleeding). Know the signs and symptoms of a stroke. To lower the risk of developing a stroke:  Avoid fatty foods.  Quit smoking.  Control your diabetes, blood pressure, and irregular heart rate. THROMBOPHLEBITIS  (BLOOD CLOT) OF THE LEG  Becoming overweight and leading a stationary lifestyle may also contribute to developing blood clots. Controlling your diet and exercising will help lower the risk of developing blood clots. CANCER SCREENING  Breast Cancer: Take steps to reduce your risk of breast cancer.  You should practice "breast self-awareness." This means understanding the normal appearance and feel of your breasts and should include breast self-examination. Any changes detected, no matter how small, should be reported to your caregiver.  After age 40, you should have a clinical breast exam (CBE) every year.  Starting at age 40, you should consider having a mammogram (breast X-ray) every year.  If you have a family history of breast cancer, talk to your caregiver about genetic screening.  If you are at high risk for breast cancer, talk to your caregiver about having an MRI and a mammogram every year.  Intestinal or Stomach Cancer: Tests to consider are a rectal exam, fecal occult blood, sigmoidoscopy, and colonoscopy. Women who are high risk may need to be screened at an earlier age and more often.  Cervical Cancer:  Beginning at age 30, you should have a Pap test every 3 years as long as the past 3 Pap tests have been normal.  If you have had past treatment for cervical cancer or a condition that could lead to cancer, you need Pap tests and screening for cancer for at least 20 years after your treatment.  If you had a hysterectomy for a problem that was not cancer or a condition that could lead to cancer, then you no longer need Pap tests.    If you are between ages 65 and 70, and you have had normal Pap tests going back 10 years, you no longer need Pap tests.  If Pap tests have been discontinued, risk factors (such as a new sexual partner) need to be reassessed to determine if screening should be resumed.  Some medical problems can increase the chance of getting cervical cancer. In these  cases, your caregiver may recommend more frequent screening and Pap tests.  Uterine Cancer: If you have vaginal bleeding after reaching menopause, you should notify your caregiver.  Ovarian Cancer: Other than yearly pelvic exams, there are no reliable tests available to screen for ovarian cancer at this time except for yearly pelvic exams.  Lung Cancer: Yearly chest X-rays can detect lung cancer and should be done on high risk women, such as cigarette smokers and women with chronic lung disease (emphysema).  Skin Cancer: A complete body skin exam should be done at your yearly examination. Avoid overexposure to the sun and ultraviolet light lamps. Use a strong sun block cream when in the sun. All of these things are important for lowering the risk of skin cancer. MENOPAUSE Menopause Symptoms: Hormone therapy products are effective for treating symptoms associated with menopause:  Moderate to severe hot flashes.  Night sweats.  Mood swings.  Headaches.  Tiredness.  Loss of sex drive.  Insomnia.  Other symptoms. Hormone replacement carries certain risks, especially in older women. Women who use or are thinking about using estrogen or estrogen with progestin treatments should discuss that with their caregiver. Your caregiver will help you understand the benefits and risks. The ideal dose of hormone replacement therapy is not known. The Food and Drug Administration (FDA) has concluded that hormone therapy should be used only at the lowest doses and for the shortest amount of time to reach treatment goals.  OSTEOPOROSIS Protecting Against Bone Loss and Preventing Fracture If you use hormone therapy for prevention of bone loss (osteoporosis), the risks for bone loss must outweigh the risk of the therapy. Ask your caregiver about other medications known to be safe and effective for preventing bone loss and fractures. To guard against bone loss or fractures, the following is recommended:  If  you are younger than age 50, take 1000 mg of calcium and at least 600 mg of Vitamin D per day.  If you are older than age 50 but younger than age 70, take 1200 mg of calcium and at least 600 mg of Vitamin D per day.  If you are older than age 70, take 1200 mg of calcium and at least 800 mg of Vitamin D per day. Smoking and excessive alcohol intake increases the risk of osteoporosis. Eat foods rich in calcium and vitamin D and do weight bearing exercises several times a week as your caregiver suggests. DIABETES Diabetes Mellitus: If you have type I or type 2 diabetes, you should keep your blood sugar under control with diet, exercise, and recommended medication. Avoid starchy and fatty foods, and too many sweets. Being overweight can make diabetes control more difficult. COGNITION AND MEMORY Cognition and Memory: Menopausal hormone therapy is not recommended for the prevention of cognitive disorders such as Alzheimer's disease or memory loss.  DEPRESSION  Depression may occur at any age, but it is common in elderly women. This may be because of physical, medical, social (loneliness), or financial problems and needs. If you are experiencing depression because of medical problems and control of symptoms, talk to your caregiver about this. Physical   activity and exercise may help with mood and sleep. Community and volunteer involvement may improve your sense of value and worth. If you have depression and you feel that the problem is getting worse or becoming severe, talk to your caregiver about which treatment options are best for you. ACCIDENTS  Accidents are common and can be serious in elderly woman. Prepare your house to prevent accidents. Eliminate throw rugs, place hand bars in bath, shower, and toilet areas. Avoid wearing high heeled shoes or walking on wet, snowy, and icy areas. Limit or stop driving if you have vision or hearing problems, or if you feel you are unsteady with your movements and  reflexes. HEPATITIS C Hepatitis C is a type of viral infection affecting the liver. It is spread mainly through contact with blood from an infected person. It can be treated, but if left untreated, it can lead to severe liver damage over the years. Many people who are infected do not know that the virus is in their blood. If you are a "baby-boomer", it is recommended that you have one screening test for Hepatitis C. IMMUNIZATIONS  Several immunizations are important to consider having during your senior years, including:   Tetanus, diphtheria, and pertussis booster shot.  Influenza every year before the flu season begins.  Pneumonia vaccine.  Shingles vaccine.  Others, as indicated based on your specific needs. Talk to your caregiver about these. Document Released: 08/11/2005 Document Revised: 11/03/2013 Document Reviewed: 04/06/2008 ExitCare Patient Information 2015 [employer]. This information is not intended to replace advice given to you by your health care provider. Make sure you discuss any questions you have with your health care provider.  

## 2014-08-04 LAB — URINALYSIS W MICROSCOPIC + REFLEX CULTURE
Bacteria, UA: NONE SEEN
Bilirubin Urine: NEGATIVE
Casts: NONE SEEN
Crystals: NONE SEEN
Glucose, UA: NEGATIVE mg/dL
Ketones, ur: NEGATIVE mg/dL
Leukocytes, UA: NEGATIVE
Nitrite: NEGATIVE
Protein, ur: NEGATIVE mg/dL
Specific Gravity, Urine: 1.01 (ref 1.005–1.030)
Squamous Epithelial / LPF: NONE SEEN
Urobilinogen, UA: 0.2 mg/dL (ref 0.0–1.0)
pH: 5.5 (ref 5.0–8.0)

## 2014-08-25 ENCOUNTER — Encounter: Payer: Self-pay | Admitting: Family Medicine

## 2014-08-25 DIAGNOSIS — M797 Fibromyalgia: Secondary | ICD-10-CM | POA: Insufficient documentation

## 2014-08-27 ENCOUNTER — Telehealth: Payer: Self-pay | Admitting: Neurology

## 2014-08-27 NOTE — Telephone Encounter (Signed)
Spoke to patient and scheduled an appt to discuss.

## 2014-08-27 NOTE — Telephone Encounter (Signed)
Patient is calling because she would like to start getting injections again for cervical dystonia. Please call patient to discuss.. Thank you.

## 2014-08-31 ENCOUNTER — Other Ambulatory Visit (HOSPITAL_COMMUNITY): Payer: Self-pay | Admitting: *Deleted

## 2014-09-01 ENCOUNTER — Encounter (HOSPITAL_COMMUNITY)
Admission: RE | Admit: 2014-09-01 | Discharge: 2014-09-01 | Disposition: A | Discharge status: Home / Self Care | Payer: 59 | Source: Ambulatory Visit | Attending: Rheumatology | Admitting: Rheumatology

## 2014-09-01 DIAGNOSIS — M069 Rheumatoid arthritis, unspecified: Secondary | ICD-10-CM | POA: Insufficient documentation

## 2014-09-01 MED ORDER — SODIUM CHLORIDE 0.9 % IV SOLN
500.0000 mg | INTRAVENOUS | Status: DC
  Administered 2014-09-01: 500 mg via INTRAVENOUS
  Filled 2014-09-01: qty 50

## 2014-09-01 MED ORDER — SODIUM CHLORIDE 0.9 % IV SOLN
INTRAVENOUS | Status: DC
  Administered 2014-09-01: 08:00:00 via INTRAVENOUS

## 2014-09-08 ENCOUNTER — Ambulatory Visit (INDEPENDENT_AMBULATORY_CARE_PROVIDER_SITE_OTHER): Payer: 59 | Admitting: Neurology

## 2014-09-08 ENCOUNTER — Encounter: Payer: Self-pay | Admitting: Neurology

## 2014-09-08 VITALS — BP 108/62 | HR 86 | Ht 66.0 in | Wt 177.0 lb

## 2014-09-08 DIAGNOSIS — G4710 Hypersomnia, unspecified: Secondary | ICD-10-CM | POA: Diagnosis not present

## 2014-09-08 DIAGNOSIS — G243 Spasmodic torticollis: Secondary | ICD-10-CM | POA: Diagnosis not present

## 2014-09-08 MED ORDER — CLONAZEPAM 0.5 MG PO TABS: 0.5000 mg | ORAL_TABLET | Freq: Two times a day (BID) | ORAL | Status: DC | PRN

## 2014-09-08 NOTE — Progress Notes (Signed)
PATIENT: Courtney Myers DOB: May 06, 1961  HISTORICAL  Arlie Solomons   HPI: 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  Symptom onset was without apparent triggering event, she has gradually developed this neck pulling,  manifested primarily with right laterocollis, mild retrocolis, and left shoulder elevation since 2000. She has good relief with regular Botox injection, about 4 times a year since 2003. Last injection was February 18, 2010 by Dr. Marta Antu,  She reported great relief as usual, taking 4- 5 days to get symptom relieve, lasting about 3 months.  I began to inject her since 12.2011, every 3-4 months, dosage has been limited to 150 units of BOTOX A.  She denies neck pain, gait difficulty, but with wearing off Botox, she noticed increased head tremor, and pulling towards her right shoulder.  She developed  transient difficulty lifting her neck up when using BOTOX A 200 units previously, reponding well to decreased dose of 150 units  She was enrolled into IPSEN dysport study, first injection was in April 30th 2013, she later enrolled into open label, 2nd injection was in  May 28th 2013. She did very well.  Recent few weeks,  She had experienced flareup of her rheumatoid arthritis, has received IV infusion. Last injection was in 06/10/2012,  500 units of dysport was dissolved into 2 cc of normal saline. Right longissimus capitus 0.5 cc Right splenius capitis 0.5 cc Right levator scapular 0.5 cc Right iliocostalis 0.5 cc   Dysport research study has completed, she is now coming back for repeat injection, she noticed head bobbing over the past few weeks, she denies significant neck pain, weakness,  Her rheumatoid arthritis is under better control   Last injection was in Sep 2014, she did very well, no neck extension weakness, only rarely neck shaking  UPDATE March 8th 2016: She has lost follow-up since her last  visit December 2014, She came in for treatment her cervical dystonia, she feel the tension of her neck all the time, stiffness. Denied gait difficulty, last injection was September 2014, she received 500 units of Dysport, works well for her, she is continue on medication for anxiety, rheumatoid arthritis She also complains of nighttime snoring, frequent awakening, excessive daytime sleepiness, fatigue, today's ESS is 6, FSS was 56,   REVIEW OF SYSTEMS: Full 14 system review of systems performed and notable only for fatigue, snoring, excessive daytime sleepiness, depression anxiety, achy muscles, joints pain, joint swelling.  ALLERGIES: No Known Allergies  HOME MEDICATIONS: Current Outpatient Prescriptions  Medication Sig Dispense Refill  . AMRIX 15 MG 24 hr capsule Take 15 mg by mouth as directed.    . B-D TB SYRINGE 1CC/27GX1/2" 27G X 1/2" 1 ML MISC   1  . baclofen (LIORESAL) 10 MG tablet Take 10 mg by mouth daily as needed.     . DULoxetine (CYMBALTA) 60 MG capsule Take 60 mg by mouth daily.    . folic acid (FOLVITE) 1 MG tablet Take 1 mg by mouth 2 (two) times daily.     Marland Kitchen inFLIXimab (REMICADE) 100 MG injection Inject 100 mg into the vein every 8 (eight) weeks.    . METHOTREXATE SODIUM, PF, IJ Inject 0.3 mLs as directed once a week.    . Multiple Vitamins-Minerals (MULTIVITAMINS THER. W/MINERALS) TABS Take 1 tablet by mouth daily.    Marland Kitchen omeprazole (PRILOSEC) 20 MG capsule TAKE (1) CAPSULE BY MOUTH EVERY DAY. 30 capsule  11  . traMADol (ULTRAM) 50 MG tablet Take 50 mg by mouth every 6 (six) hours as needed for pain.     No current facility-administered medications for this visit.    PAST MEDICAL HISTORY: Past Medical History  Diagnosis Date  . Fibromyalgia   . RA (rheumatoid arthritis)   . GERD (gastroesophageal reflux disease)   . Anxiety   . Depression   . History of shingles   . Synovitis of hand   . Cervical dystonia     PAST SURGICAL HISTORY: Past Surgical History    Procedure Laterality Date  . Tonsillectomy    . Vaginal hysterectomy  3/08    FAMILY HISTORY: Family History  Problem Relation Age of Onset  . Arthritis Mother   . Heart Problems Father   . Heart disease Father   . Heart disease Paternal Uncle     MI    SOCIAL HISTORY:  History   Social History  . Marital Status: Married    Spouse Name: Courtney Myers  . Number of Children: 2  . Years of Education: 12   Occupational History  .  [place] History Main Topics  . Smoking status: Current Every Day Smoker -- 0.50 packs/day for 30 years    Types: Cigarettes    Last Attempt to Quit: 07/03/2012  . Smokeless tobacco: Never Used  . Alcohol Use: 0.0 oz/week    0 Standard drinks or equivalent per week     Comment: Rare  . Drug Use: No  . Sexual Activity: Yes    Birth Control/ Protection: Surgical     Comment: 1st intercourse 54 yo-Fewer than 5 partners   Other Topics Concern  . 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 Right handed.    Caffeine- 4 cups daily.     PHYSICAL EXAM   Filed Vitals:   09/08/14 1321  BP: 108/62  Pulse: 86  Height: 5\' 6"  (1.676 m)  Weight: 177 lb (80.287 kg)    Not recorded      Body mass index is 28.58 kg/(m^2).  PHYSICAL EXAMNIATION:  Gen: NAD, conversant, well nourised, obese, well groomed                     Cardiovascular: Regular rate rhythm, no peripheral edema, warm, nontender. Eyes: Conjunctivae clear without exudates or hemorrhage Neck: Supple, no carotid bruise. Pulmonary: Clear to auscultation bilaterally   NEUROLOGICAL EXAM:  MENTAL STATUS: Speech:    Speech is normal; fluent and spontaneous with normal comprehension.  Cognition:    The patient is oriented to person, place, and time;     recent and remote memory intact;     language fluent;     normal attention, concentration,      fund of knowledge.  CRANIAL NERVES:Depressed looking middle-age female, mild retrocollis, right tilt, left turn, mild left shoulder elevation.  CN II: Visual fields are full to confrontation. Fundoscopic exam is normal with sharp discs and no vascular changes. Venous pulsations are present bilaterally. Pupils are 4 mm and briskly reactive to light. Visual acuity is 20/20 bilaterally. CN III, IV, VI: extraocular movement are normal. No ptosis. CN V: Facial sensation is intact to pinprick in all 3 divisions bilaterally. Corneal responses are intact.  CN VII: Face is symmetric with normal eye closure and smile. CN VIII: Hearing is normal to  rubbing fingers CN IX, X: Palate elevates symmetrically. Phonation is normal. CN XI: Head turning and shoulder shrug are intact CN XII: Tongue is midline with normal movements and no atrophy.  MOTOR: There is no pronator drift of out-stretched arms. Muscle bulk and tone are normal. Muscle strength is normal.   Shoulder abduction Shoulder external rotation Elbow flexion Elbow extension Wrist flexion Wrist extension Finger abduction Hip flexion Knee flexion Knee extension Ankle dorsi flexion Ankle plantar flexion  R 5 5 5 5 5 5 5 5 5 5 5 5   L 5 5 5 5 5 5 5 5 5 5 5 5     REFLEXES: Reflexes are 2+ and symmetric at the biceps, triceps, knees, and ankles. Plantar responses are flexor.  SENSORY: Light touch, pinprick, position sense, and vibration sense are intact in fingers and toes.  COORDINATION: Rapid alternating movements and fine finger movements are intact. There is no dysmetria on finger-to-nose and heel-knee-shin. There are no abnormal or extraneous movements.   GAIT/STANCE: Posture is normal. Gait is steady with normal steps, base, arm swing, and turning. Heel and toe walking are normal. Tandem gait is normal.  Romberg is absent.   DIAGNOSTIC DATA (LABS, IMAGING, TESTING) - I reviewed patient records, labs, notes, testing and imaging myself where  available.  No results found for: WBC, HGB, HCT, MCV, PLT    Component Value Date/Time   GLUCOSE 110* 06/18/2013 1556   Lab Results  Component Value Date   CHOL 220* 06/18/2013   HDL 52 06/18/2013   LDLCALC 147* 06/18/2013   TRIG 107 06/18/2013   CHOLHDL 4.2 06/18/2013   No results found for: HGBA1C No results found for: VITAMINB12 No results found for: TSH    ASSESSMENT AND PLAN  GWENDELYN LANTING is a 54 y.o. female  With long-standing history of cervical dystonia, responded very well to previous EMG guided Dysport injection, will repeat injection after preauthorization. She also has symptoms consistent with obstructive sleep apnea, she has narrow oropharyngeal on examination, excessive daytime sleepiness, ESS was 6, FSS was 56. Refer her to sleep study.  Marcial Pacas, M.D. Ph.D.  [facility] [address] [place], [place] [address] Ph: [phone] Fax: [phone]

## 2014-09-15 ENCOUNTER — Telehealth: Payer: Self-pay | Admitting: Neurology

## 2014-09-15 DIAGNOSIS — G4719 Other hypersomnia: Secondary | ICD-10-CM

## 2014-09-15 DIAGNOSIS — R0683 Snoring: Secondary | ICD-10-CM

## 2014-09-15 DIAGNOSIS — E663 Overweight: Secondary | ICD-10-CM

## 2014-09-15 NOTE — Telephone Encounter (Signed)
Dr. Marcial Pacas refers patient for attended sleep study.  Height: 5'6"  Weight: 177 lb  BMI: 28.58  Past Medical History:  Fibromyalgia    . RA (rheumatoid arthritis)   . GERD (gastroesophageal reflux disease)   . Anxiety   . Depression   . History of shingles   . Synovitis of hand   . Cervical dystonia            Sleep Symptoms: She also complains of nighttime snoring, frequent awakening, excessive daytime sleepiness, fatigue   Epworth Score: ESS (6)   Medication:  Baclofen (Tab) LIORESAL 10 MG Take 10 mg by mouth daily as needed.        ClonazePAM (Tab) KLONOPIN 0.5 MG Take 1 tablet (0.5 mg total) by mouth 2 (two) times daily as needed for anxiety.      Cyclobenzaprine HCl (Capsule SR 24 hr) AMRIX 15 MG Take 15 mg by mouth as directed.      DULoxetine HCl (Cap DR Particles) CYMBALTA 60 MG Take 60 mg by mouth daily.      Folic Acid (Tab) FOLVITE 1 MG Take 1 mg by mouth 2 (two) times daily.       InFLIXimab (Recon Soln) REMICADE 100 MG Inject 100 mg into the vein every 8 (eight) weeks.      Methotrexate Sodium   Inject 0.3 mLs as directed once a week.      Multiple Vitamins-Minerals (Tab) multivitamins ther. w/minerals  Take 1 tablet by mouth daily.      Omeprazole (Capsule Delayed Release) PRILOSEC 20 MG TAKE (1) CAPSULE BY MOUTH EVERY DAY.      TraMADol HCl (Tab) ULTRAM 50 MG Take 50 mg by mouth every 6 (six) hours as needed for pain.      Tuberculin-Allergy Syringes (Misc) B-D TB SYRINGE 1CC/27GX1/2" 27G X 1/2" 1 ML          Ins: [employer]   Assessment & Plan: Courtney Myers is a 54 y.o. female With long-standing history of cervical dystonia, responded very well to previous EMG guided Dysport injection, will repeat injection after preauthorization. She also has symptoms consistent with obstructive sleep apnea, she has narrow oropharyngeal on examination, excessive daytime sleepiness, ESS was 6, FSS was 56.  Refer her to sleep study.  Please review patient information and submit instructions for scheduling and orders for sleep technologist. Thank you.

## 2014-09-16 NOTE — Telephone Encounter (Signed)
In House Sleep study request review: This patient has an underlying medical history of cervical dystonia, reflux disease, rheumatoid arthritis, anxiety and depression as well as overweight state and is referred by Dr. Krista Blue for an attended sleep study due to a report of snoring and excessive daytime somnolence. I will order a split-night sleep study and see the patient in sleep medicine consultation afterwards as appropriate. Please print this note and attach to sleep study chart/package.   Sleep Acquisition Technologist instructions: Please score at 4% and split if 2 hour estimated AHI >20/h, unless mandated otherwise by the insurance carrier.    Star Age, MD, PhD [facility])

## 2014-10-04 ENCOUNTER — Telehealth: Payer: Self-pay | Admitting: *Deleted

## 2014-10-04 NOTE — Telephone Encounter (Signed)
Pt. Had not arrived for her schedule sleep study at 8: 00 pm and pt was called and message left in voicemail.

## 2014-10-13 ENCOUNTER — Encounter: Payer: Self-pay | Admitting: Neurology

## 2014-10-13 ENCOUNTER — Ambulatory Visit (INDEPENDENT_AMBULATORY_CARE_PROVIDER_SITE_OTHER): Payer: 59 | Admitting: Neurology

## 2014-10-13 VITALS — BP 112/70 | HR 92 | Resp 16 | Ht 66.0 in | Wt 179.0 lb

## 2014-10-13 DIAGNOSIS — G243 Spasmodic torticollis: Secondary | ICD-10-CM

## 2014-10-13 DIAGNOSIS — G4710 Hypersomnia, unspecified: Secondary | ICD-10-CM | POA: Diagnosis not present

## 2014-10-13 DIAGNOSIS — F329 Major depressive disorder, single episode, unspecified: Secondary | ICD-10-CM

## 2014-10-13 DIAGNOSIS — F32A Depression, unspecified: Secondary | ICD-10-CM

## 2014-10-13 NOTE — Progress Notes (Signed)
PATIENT: Courtney Myers DOB: 05-20-1961  HISTORICAL  Arlie Solomons   HPI: Ms. Stoklosa is a 54 year-old right-handed Caucasian female, came in for EMG guided Botox for cervical dystonia.  Symptom onset was without apparent triggering event, she has gradually developed this neck pulling,  manifested primarily with right laterocollis, mild retrocolis, and left shoulder elevation since 2000. She has good relief with regular Botox injection, about 4 times a year since 2003. Last injection was February 18, 2010 by Dr. Marta Antu,  She reported great relief as usual, taking 4- 5 days to get symptom relieve, lasting about 3 months.  I began to inject her since 12.2011, every 3-4 months, dosage has been limited to 150 units of BOTOX A.  She denies neck pain, gait difficulty, but with wearing off Botox, she noticed increased head tremor, and pulling towards her right shoulder.  She developed  transient difficulty lifting her neck up when using BOTOX A 200 units previously, reponding well to decreased dose of 150 units  She was enrolled into IPSEN dysport study, first injection was in April 30th 2013, she later enrolled into open label, 2nd injection was in  May 28th 2013. She did very well.  Recent few weeks,  She had experienced flareup of her rheumatoid arthritis, has received IV infusion. Last injection was in 06/10/2012,  500 units of dysport was dissolved into 2 cc of normal saline. Right longissimus capitus 0.5 cc Right splenius capitis 0.5 cc Right levator scapular 0.5 cc Right iliocostalis 0.5 cc   Dysport research study has completed, she is now coming back for repeat injection, she noticed head bobbing over the past few weeks, she denies significant neck pain, weakness,  Her rheumatoid arthritis is under better control   Last injection was in Sep 2014, she did very well, no neck extension weakness, only rarely neck shaking  UPDATE March 8th 2016: She has lost follow-up since her last  visit December 2014, She came in for treatment her cervical dystonia, she feel the tension of her neck all the time, stiffness. Denied gait difficulty, last injection was September 2014, she received 500 units of Dysport, works well for her, she is continue on medication for anxiety, rheumatoid arthritis She also complains of nighttime snoring, frequent awakening, excessive daytime sleepiness, fatigue, today's ESS is 6, FSS was 22,  UPDATE October 13 2014:   She has missed her scheduled sleep study, continue have worsening neck posturing, posterior neck pain, excessive daytime fatigue and sleepiness  REVIEW OF SYSTEMS: Full 14 system review of systems performed and notable only for fatigue, snoring, excessive daytime sleepiness, depression anxiety, achy muscles, joints pain, joint swelling.  ALLERGIES: No Known Allergies  HOME MEDICATIONS: Current Outpatient Prescriptions  Medication Sig Dispense Refill  . AMRIX 15 MG 24 hr capsule Take 15 mg by mouth as directed.    . B-D TB SYRINGE 1CC/27GX1/2" 27G X 1/2" 1 ML MISC   1  . baclofen (LIORESAL) 10 MG tablet Take 10 mg by mouth daily as needed.     . clonazePAM (KLONOPIN) 0.5 MG tablet Take 1 tablet (0.5 mg total) by mouth 2 (two) times daily as needed for anxiety. 60 tablet 3  . DULoxetine (CYMBALTA) 60 MG capsule Take 60 mg by mouth daily.    . folic acid (FOLVITE) 1 MG tablet Take 1 mg by mouth 2 (two) times daily.     Marland Kitchen inFLIXimab (REMICADE) 100 MG injection Inject 100 mg into the vein every 8 (  eight) weeks.    . METHOTREXATE SODIUM, PF, IJ Inject 0.3 mLs as directed once a week.    . Multiple Vitamins-Minerals (MULTIVITAMINS THER. W/MINERALS) TABS Take 1 tablet by mouth daily.    Marland Kitchen omeprazole (PRILOSEC) 20 MG capsule TAKE (1) CAPSULE BY MOUTH EVERY DAY. 30 capsule 11  . traMADol (ULTRAM) 50 MG tablet Take 50 mg by mouth every 6 (six) hours as needed for pain.     No current facility-administered medications for this visit.    PAST  MEDICAL HISTORY: Past Medical History  Diagnosis Date  . Fibromyalgia   . RA (rheumatoid arthritis)   . GERD (gastroesophageal reflux disease)   . Anxiety   . Depression   . History of shingles   . Synovitis of hand   . Cervical dystonia     PAST SURGICAL HISTORY: Past Surgical History  Procedure Laterality Date  . Tonsillectomy    . Vaginal hysterectomy  3/08    FAMILY HISTORY: Family History  Problem Relation Age of Onset  . Arthritis Mother   . Heart Problems Father   . Heart disease Father   . Heart disease Paternal Uncle     MI    SOCIAL HISTORY:  History   Social History  . Marital Status: Married    Spouse Name: [name]  . Number of Children: 2  . Years of Education: 12   Occupational History  .  [place] History Main Topics  . Smoking status: Current Every Day Smoker -- 0.50 packs/day for 30 years    Types: Cigarettes    Last Attempt to Quit: 07/03/2012  . Smokeless tobacco: Never Used  . Alcohol Use: 0.0 oz/week    0 Standard drinks or equivalent per week     Comment: Rare  . Drug Use: No  . Sexual Activity: Yes    Birth Control/ Protection: Surgical     Comment: 1st intercourse 54 yo-Fewer than 5 partners   Other Topics Concern  . 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 Right handed.    Caffeine- 4 cups daily.     PHYSICAL EXAM   Filed Vitals:   10/13/14 1258  BP: 112/70  Pulse: 92  Resp: 16  Height: 5\' 6"  (1.676 m)  Weight: 179 lb (81.194 kg)    Not recorded      Body mass index is 28.91 kg/(m^2).  PHYSICAL EXAMNIATION:  Gen: NAD, conversant, well nourised, obese, well groomed                     Cardiovascular: Regular rate rhythm, no peripheral edema, warm, nontender. Eyes: Conjunctivae clear without exudates or hemorrhage Neck: Supple, no carotid bruise. Pulmonary: Clear to  auscultation bilaterally   NEUROLOGICAL EXAM:  MENTAL STATUS: Speech:    Speech is normal; fluent and spontaneous with normal comprehension.  Cognition:    The patient is oriented to person, place, and time;     recent and remote memory intact;     language fluent;     normal attention, concentration,     fund of knowledge.  CRANIAL NERVES:Depressed looking middle-age female, mild retrocollis, right tilt, left turn, mild left shoulder elevation.  CN II: Visual fields are full to confrontation. Fundoscopic exam is normal with sharp discs and no vascular changes. Venous pulsations are present bilaterally. Pupils are 4 mm and  briskly reactive to light. Visual acuity is 20/20 bilaterally. CN III, IV, VI: extraocular movement are normal. No ptosis. CN V: Facial sensation is intact to pinprick in all 3 divisions bilaterally. Corneal responses are intact.  CN VII: Face is symmetric with normal eye closure and smile. CN VIII: Hearing is normal to rubbing fingers CN IX, X: Palate elevates symmetrically. Phonation is normal. CN XI: Head turning and shoulder shrug are intact CN XII: Tongue is midline with normal movements and no atrophy.  MOTOR: There is no pronator drift of out-stretched arms. Muscle bulk and tone are normal. Muscle strength is normal.   Shoulder abduction Shoulder external rotation Elbow flexion Elbow extension Wrist flexion Wrist extension Finger abduction Hip flexion Knee flexion Knee extension Ankle dorsi flexion Ankle plantar flexion  R 5 5 5 5 5 5 5 5 5 5 5 5   L 5 5 5 5 5 5 5 5 5 5 5 5     REFLEXES: Reflexes are 2+ and symmetric at the biceps, triceps, knees, and ankles. Plantar responses are flexor.  SENSORY: Light touch, pinprick, position sense, and vibration sense are intact in fingers and toes.  COORDINATION: Rapid alternating movements and fine finger movements are intact. There is no dysmetria on finger-to-nose and heel-knee-shin. There are no abnormal or  extraneous movements.   GAIT/STANCE: Posture is normal. Gait is steady with normal steps, base, arm swing, and turning. Heel and toe walking are normal. Tandem gait is normal.  Romberg is absent.   DIAGNOSTIC DATA (LABS, IMAGING, TESTING) - I reviewed patient records, labs, notes, testing and imaging myself where available.  No results found for: WBC, HGB, HCT, MCV, PLT    Component Value Date/Time   GLUCOSE 110* 06/18/2013 1556   Lab Results  Component Value Date   CHOL 220* 06/18/2013   HDL 52 06/18/2013   LDLCALC 147* 06/18/2013   TRIG 107 06/18/2013   CHOLHDL 4.2 06/18/2013    ASSESSMENT AND PLAN  LORISA SCHEID is a 54 y.o. female  With long-standing history of cervical dystonia, responded very well to previous EMG guided Dysport injection, will repeat injection after preauthorization. She also has symptoms consistent with obstructive sleep apnea, she has narrow oropharyngeal on examination, excessive daytime sleepiness, ESS was 6, FSS was 56. Will refer her to sleep study again  Marcial Pacas, M.D. Ph.D.  [facility] [address] [place], [place] [address] Ph: [phone] Fax: [phone]

## 2014-10-26 ENCOUNTER — Other Ambulatory Visit (HOSPITAL_COMMUNITY): Payer: Self-pay | Admitting: *Deleted

## 2014-10-27 ENCOUNTER — Encounter (HOSPITAL_COMMUNITY): Payer: 59

## 2014-10-27 ENCOUNTER — Encounter (HOSPITAL_COMMUNITY)
Admission: RE | Admit: 2014-10-27 | Discharge: 2014-10-27 | Disposition: A | Discharge status: Home / Self Care | Payer: 59 | Source: Ambulatory Visit | Attending: Rheumatology | Admitting: Rheumatology

## 2014-10-27 DIAGNOSIS — M069 Rheumatoid arthritis, unspecified: Secondary | ICD-10-CM | POA: Insufficient documentation

## 2014-10-27 MED ORDER — SODIUM CHLORIDE 0.9 % IV SOLN
INTRAVENOUS | Status: DC
  Administered 2014-10-27: 09:00:00 via INTRAVENOUS

## 2014-10-27 MED ORDER — DIPHENHYDRAMINE HCL 25 MG PO CAPS: 50.0000 mg | ORAL_CAPSULE | ORAL | Status: DC

## 2014-10-27 MED ORDER — ACETAMINOPHEN 325 MG PO TABS: 650.0000 mg | ORAL_TABLET | ORAL | Status: DC

## 2014-10-27 MED ORDER — INFLIXIMAB 100 MG IV SOLR
500.0000 mg | INTRAVENOUS | Status: DC
  Administered 2014-10-27: 500 mg via INTRAVENOUS
  Filled 2014-10-27: qty 50

## 2014-11-11 ENCOUNTER — Encounter: Payer: Self-pay | Admitting: Neurology

## 2014-11-11 ENCOUNTER — Ambulatory Visit (INDEPENDENT_AMBULATORY_CARE_PROVIDER_SITE_OTHER): Payer: 59 | Admitting: Neurology

## 2014-11-11 ENCOUNTER — Encounter: Payer: Self-pay | Admitting: *Deleted

## 2014-11-11 VITALS — Ht 66.0 in | Wt 180.0 lb

## 2014-11-11 DIAGNOSIS — G243 Spasmodic torticollis: Secondary | ICD-10-CM

## 2014-11-11 NOTE — Progress Notes (Signed)
**  Lot #[id number], Exp 04/2015**mck,rn

## 2014-11-11 NOTE — Progress Notes (Signed)
PATIENT: Courtney Myers DOB: 11/16/1960  HISTORICAL  Arlie Solomons   HPI: Courtney Myers is a 54 year-old right-handed Caucasian female, came in for EMG guided Botox for cervical dystonia.  Symptom onset was without apparent triggering event, she has gradually developed this neck pulling,  manifested primarily with right laterocollis, mild retrocolis, and left shoulder elevation since 2000. She has good relief with regular Botox injection, about 4 times a year since 2003. Last injection was February 18, 2010 by Dr. Marta Antu,  She reported great relief as usual, taking 4- 5 days to get symptom relieve, lasting about 3 months.  I began to inject her since 12.2011, every 3-4 months, dosage has been limited to 150 units of BOTOX A.  She denies neck pain, gait difficulty, but with wearing off Botox, she noticed increased head tremor, and pulling towards her right shoulder.  She developed  transient difficulty lifting her neck up when using BOTOX A 200 units previously, reponding well to decreased dose of 150 units  She was enrolled into IPSEN dysport study, first injection was in April 30th 2013, she later enrolled into open label, 2nd injection was in  May 28th 2013. She did very well.  Recent few weeks,  She had experienced flareup of her rheumatoid arthritis, has received IV infusion. Last injection was in 06/10/2012,  500 units of dysport was dissolved into 2 cc of normal saline. Right longissimus capitus 0.5 cc Right splenius capitis 0.5 cc Right levator scapular 0.5 cc Right iliocostalis 0.5 cc   Dysport research study has completed, she is now coming back for repeat injection, she noticed head bobbing over the past few weeks, she denies significant neck pain, weakness,  Her rheumatoid arthritis is under better control   Last injection was in Sep 2014, she did very well, no neck extension weakness, only rarely neck shaking  UPDATE March 8th 2016: She has lost follow-up since her last  visit December 2014, She came in for treatment her cervical dystonia, she feel the tension of her neck all the time, stiffness. Denied gait difficulty, last injection was September 2014, she received 500 units of Dysport, works well for her, she is continue on medication for anxiety, rheumatoid arthritis She also complains of nighttime snoring, frequent awakening, excessive daytime sleepiness, fatigue, today's ESS is 6, FSS was 24,  UPDATE October 13 2014:   She has missed her scheduled sleep study, continue have worsening neck posturing, posterior neck pain, excessive daytime fatigue and sleepiness  UPDATE Nov 11 2014: She came in for EMG guided Dysport injection today, last injection was December 2014, she complains of worsening head titubation, bilateral hands mild postural tremor, posterior neck pain,  REVIEW OF SYSTEMS: Full 14 system review of systems performed and notable only for as above   ALLERGIES: No Known Allergies  HOME MEDICATIONS: Current Outpatient Prescriptions  Medication Sig Dispense Refill  . AbobotulinumtoxinA (DYSPORT) 500 UNITS SOLR injection Inject 500 Units into the muscle once.    . AMRIX 15 MG 24 hr capsule Take 15 mg by mouth as directed.    . B-D TB SYRINGE 1CC/27GX1/2" 27G X 1/2" 1 ML MISC   1  . baclofen (LIORESAL) 10 MG tablet Take 10 mg by mouth daily as needed.     . clonazePAM (KLONOPIN) 0.5 MG tablet Take 1 tablet (0.5 mg total) by mouth 2 (two) times daily as needed for anxiety. 60 tablet 3  . DULoxetine (CYMBALTA) 60 MG capsule Take 60 mg  by mouth daily.    . folic acid (FOLVITE) 1 MG tablet Take 1 mg by mouth 2 (two) times daily.     Marland Kitchen inFLIXimab (REMICADE) 100 MG injection Inject 100 mg into the vein every 8 (eight) weeks.    . METHOTREXATE SODIUM, PF, IJ Inject 0.3 mLs as directed once a week.    . Multiple Vitamins-Minerals (MULTIVITAMINS THER. W/MINERALS) TABS Take 1 tablet by mouth daily.    Marland Kitchen omeprazole (PRILOSEC) 20 MG capsule TAKE (1) CAPSULE  BY MOUTH EVERY DAY. 30 capsule 11  . traMADol (ULTRAM) 50 MG tablet Take 50 mg by mouth every 6 (six) hours as needed for pain.     No current facility-administered medications for this visit.    PAST MEDICAL HISTORY: Past Medical History  Diagnosis Date  . Fibromyalgia   . RA (rheumatoid arthritis)   . GERD (gastroesophageal reflux disease)   . Anxiety   . Depression   . History of shingles   . Synovitis of hand   . Cervical dystonia     PAST SURGICAL HISTORY: Past Surgical History  Procedure Laterality Date  . Tonsillectomy    . Vaginal hysterectomy  3/08    FAMILY HISTORY: Family History  Problem Relation Age of Onset  . Arthritis Mother   . Heart Problems Father   . Heart disease Father   . Heart disease Paternal Uncle     MI    SOCIAL HISTORY:  History   Social History  . Marital Status: Married    Spouse Name: [name]  . Number of Children: 2  . Years of Education: 12   Occupational History  .  [place] History Main Topics  . Smoking status: Current Every Day Smoker -- 0.50 packs/day for 30 years    Types: Cigarettes    Last Attempt to Quit: 07/03/2012  . Smokeless tobacco: Never Used  . Alcohol Use: 0.0 oz/week    0 Standard drinks or equivalent per week     Comment: Rare  . Drug Use: No  . Sexual Activity: Yes    Birth Control/ Protection: Surgical     Comment: 1st intercourse 54 yo-Fewer than 5 partners   Other Topics Concern  . 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 Right handed.    Caffeine- 4 cups daily.     PHYSICAL EXAM   Filed Vitals:   11/11/14 1428  Height: 5\' 6"  (1.676 m)  Weight: 180 lb (81.647 kg)    Not recorded      Body mass index is 29.07 kg/(m^2).  PHYSICAL EXAMNIATION: MENTAL STATUS: Speech is normal, normal to casual conversation, and history taking  CRANIAL  NERVES:Depressed looking middle-age female, mild retrocollis, right tilt, left turn, mild left shoulder elevation.   MOTOR: Normal tone and bulk and strength COORDINATION: No dysmetria   GAIT/STANCE: Posture is normal.  DIAGNOSTIC DATA (LABS, IMAGING, TESTING) - I reviewed patient records, labs, notes, testing and imaging myself where available.  No results found for: WBC, HGB, HCT, MCV, PLT    Component Value Date/Time   GLUCOSE 110* 06/18/2013 1556   Lab Results  Component Value Date   CHOL 220* 06/18/2013   HDL 52 06/18/2013   LDLCALC 147* 06/18/2013   TRIG 107 06/18/2013   CHOLHDL 4.2 06/18/2013    ASSESSMENT AND PLAN  Courtney Myers is a 54  y.o. female  With long-standing history of cervical dystonia, responded very well to previous EMG guided Dysport injection, last injection was December 2014  500 of Dysport was dissolved into 2 cc of normal saline,  Left levator scapular 0.2 5 cc Right longissimus capitus 0. 2 5cc  Right inferior oblique capitis, 0.5 cc Left inferior oblique capitis 0.5 cc  Right splenius capitis 0.25 cc Right splenius cervix 0.25 cc  Patient tolerate the injection well, will return to clinic in 3 months for repeat injection   Marcial Pacas, M.D. Ph.D.  [facility] [address] [place], [place] [address] Ph: [phone] Fax: [phone]

## 2014-12-21 ENCOUNTER — Other Ambulatory Visit (HOSPITAL_COMMUNITY): Payer: Self-pay

## 2014-12-22 ENCOUNTER — Encounter (HOSPITAL_COMMUNITY)
Admission: RE | Admit: 2014-12-22 | Discharge: 2014-12-22 | Disposition: A | Discharge status: Home / Self Care | Payer: 59 | Source: Ambulatory Visit | Attending: Rheumatology | Admitting: Rheumatology

## 2014-12-22 DIAGNOSIS — M069 Rheumatoid arthritis, unspecified: Secondary | ICD-10-CM | POA: Diagnosis not present

## 2014-12-22 LAB — CBC
HCT: 41.4 % (ref 36.0–46.0)
Hemoglobin: 14 g/dL (ref 12.0–15.0)
MCH: 30.4 pg (ref 26.0–34.0)
MCHC: 33.8 g/dL (ref 30.0–36.0)
MCV: 90 fL (ref 78.0–100.0)
Platelets: 228 10*3/uL (ref 150–400)
RBC: 4.6 MIL/uL (ref 3.87–5.11)
RDW: 14.3 % (ref 11.5–15.5)
WBC: 7.7 10*3/uL (ref 4.0–10.5)

## 2014-12-22 LAB — DIFFERENTIAL
Basophils Absolute: 0.1 10*3/uL (ref 0.0–0.1)
Basophils Relative: 1 % (ref 0–1)
Eosinophils Absolute: 0.2 10*3/uL (ref 0.0–0.7)
Eosinophils Relative: 3 % (ref 0–5)
Lymphocytes Relative: 29 % (ref 12–46)
Lymphs Abs: 2.2 10*3/uL (ref 0.7–4.0)
Monocytes Absolute: 0.6 10*3/uL (ref 0.1–1.0)
Monocytes Relative: 8 % (ref 3–12)
Neutro Abs: 4.5 10*3/uL (ref 1.7–7.7)
Neutrophils Relative %: 59 % (ref 43–77)

## 2014-12-22 LAB — COMPREHENSIVE METABOLIC PANEL
ALT: 25 U/L (ref 14–54)
AST: 25 U/L (ref 15–41)
Albumin: 3.8 g/dL (ref 3.5–5.0)
Alkaline Phosphatase: 96 U/L (ref 38–126)
Anion gap: 6 (ref 5–15)
BUN: 5 mg/dL — ABNORMAL LOW (ref 6–20)
CO2: 27 mmol/L (ref 22–32)
Calcium: 9.4 mg/dL (ref 8.9–10.3)
Chloride: 105 mmol/L (ref 101–111)
Creatinine, Ser: 0.7 mg/dL (ref 0.44–1.00)
GFR calc Af Amer: >60 mL/min (ref >60)
GFR calc non Af Amer: >60 mL/min (ref >60)
Glucose, Bld: 105 mg/dL — ABNORMAL HIGH (ref 65–99)
Potassium: 3.8 mmol/L (ref 3.5–5.1)
Sodium: 138 mmol/L (ref 135–145)
Total Bilirubin: 0.5 mg/dL (ref 0.3–1.2)
Total Protein: 7.1 g/dL (ref 6.5–8.1)

## 2014-12-22 MED ORDER — DIPHENHYDRAMINE HCL 25 MG PO TABS
50.0000 mg | ORAL_TABLET | Freq: Once | ORAL | Status: DC
Start: 2014-12-22 — End: 2014-12-23
  Filled 2014-12-22: qty 2

## 2014-12-22 MED ORDER — SODIUM CHLORIDE 0.9 % IV SOLN
500.0000 mg | Freq: Once | INTRAVENOUS | Status: AC
  Administered 2014-12-22: 500 mg via INTRAVENOUS
  Filled 2014-12-22: qty 50

## 2014-12-22 MED ORDER — ACETAMINOPHEN 325 MG PO TABS: 650.0000 mg | ORAL_TABLET | Freq: Once | ORAL | Status: DC

## 2015-01-23 ENCOUNTER — Other Ambulatory Visit: Payer: Self-pay | Admitting: Neurology

## 2015-01-25 ENCOUNTER — Telehealth: Payer: Self-pay | Admitting: *Deleted

## 2015-01-25 ENCOUNTER — Other Ambulatory Visit: Payer: Self-pay

## 2015-01-25 MED ORDER — CLONAZEPAM 0.5 MG PO TABS: 0.5000 mg | ORAL_TABLET | Freq: Two times a day (BID) | ORAL | Status: DC | PRN

## 2015-01-25 NOTE — Telephone Encounter (Signed)
Rx for clonazepam faxed and confirmed to pharmacy [phone]).

## 2015-02-15 ENCOUNTER — Other Ambulatory Visit (HOSPITAL_COMMUNITY): Payer: Self-pay

## 2015-02-16 ENCOUNTER — Encounter (HOSPITAL_COMMUNITY)
Admission: RE | Admit: 2015-02-16 | Discharge: 2015-02-16 | Disposition: A | Discharge status: Home / Self Care | Payer: 59 | Source: Ambulatory Visit | Attending: Rheumatology | Admitting: Rheumatology

## 2015-02-16 DIAGNOSIS — M069 Rheumatoid arthritis, unspecified: Secondary | ICD-10-CM | POA: Diagnosis present

## 2015-02-16 LAB — COMPREHENSIVE METABOLIC PANEL
ALT: 19 U/L (ref 14–54)
AST: 26 U/L (ref 15–41)
Albumin: 3.6 g/dL (ref 3.5–5.0)
Alkaline Phosphatase: 80 U/L (ref 38–126)
Anion gap: 10 (ref 5–15)
BUN: 9 mg/dL (ref 6–20)
CO2: 23 mmol/L (ref 22–32)
Calcium: 9.4 mg/dL (ref 8.9–10.3)
Chloride: 106 mmol/L (ref 101–111)
Creatinine, Ser: 0.75 mg/dL (ref 0.44–1.00)
GFR calc Af Amer: >60 mL/min (ref >60)
GFR calc non Af Amer: >60 mL/min (ref >60)
Glucose, Bld: 106 mg/dL — ABNORMAL HIGH (ref 65–99)
Potassium: 3.8 mmol/L (ref 3.5–5.1)
Sodium: 139 mmol/L (ref 135–145)
Total Bilirubin: 0.8 mg/dL (ref 0.3–1.2)
Total Protein: 6.6 g/dL (ref 6.5–8.1)

## 2015-02-16 LAB — CBC
HCT: 38.6 % (ref 36.0–46.0)
Hemoglobin: 13.3 g/dL (ref 12.0–15.0)
MCH: 30.9 pg (ref 26.0–34.0)
MCHC: 34.5 g/dL (ref 30.0–36.0)
MCV: 89.6 fL (ref 78.0–100.0)
Platelets: 194 10*3/uL (ref 150–400)
RBC: 4.31 MIL/uL (ref 3.87–5.11)
RDW: 14.2 % (ref 11.5–15.5)
WBC: 10.3 10*3/uL (ref 4.0–10.5)

## 2015-02-16 LAB — DIFFERENTIAL
Basophils Absolute: 0 10*3/uL (ref 0.0–0.1)
Basophils Relative: 0 % (ref 0–1)
Eosinophils Absolute: 0.2 10*3/uL (ref 0.0–0.7)
Eosinophils Relative: 2 % (ref 0–5)
Lymphocytes Relative: 25 % (ref 12–46)
Lymphs Abs: 2.6 10*3/uL (ref 0.7–4.0)
Monocytes Absolute: 0.6 10*3/uL (ref 0.1–1.0)
Monocytes Relative: 6 % (ref 3–12)
Neutro Abs: 6.9 10*3/uL (ref 1.7–7.7)
Neutrophils Relative %: 67 % (ref 43–77)

## 2015-02-16 MED ORDER — SODIUM CHLORIDE 0.9 % IV SOLN
500.0000 mg | INTRAVENOUS | Status: DC
  Administered 2015-02-16: 500 mg via INTRAVENOUS
  Filled 2015-02-16: qty 50

## 2015-02-16 MED ORDER — DIPHENHYDRAMINE HCL 25 MG PO CAPS: 25.0000 mg | ORAL_CAPSULE | Freq: Once | ORAL | Status: DC

## 2015-02-16 MED ORDER — ACETAMINOPHEN 325 MG PO TABS: 650.0000 mg | ORAL_TABLET | Freq: Once | ORAL | Status: DC

## 2015-02-17 ENCOUNTER — Ambulatory Visit (INDEPENDENT_AMBULATORY_CARE_PROVIDER_SITE_OTHER): Payer: 59 | Admitting: Neurology

## 2015-02-17 ENCOUNTER — Encounter: Payer: Self-pay | Admitting: Neurology

## 2015-02-17 VITALS — BP 101/66 | HR 88 | Ht 66.0 in | Wt 182.0 lb

## 2015-02-17 DIAGNOSIS — G243 Spasmodic torticollis: Secondary | ICD-10-CM | POA: Diagnosis not present

## 2015-02-17 NOTE — Progress Notes (Signed)
PATIENT: Courtney Myers DOB: 1961-04-23  HISTORICAL  Courtney Myers   HPI: 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  Symptom onset was without apparent triggering event, she has gradually developed this neck pulling,  manifested primarily with right laterocollis, mild retrocolis, and left shoulder elevation since 2000. She has good relief with regular Botox injection, about 4 times a year since 2003. Last injection was February 18, 2010 by Dr. Marta Antu,  She reported great relief as usual, taking 4- 5 days to get symptom relieve, lasting about 3 months.  I began to inject her since 12.2011, every 3-4 months, dosage has been limited to 150 units of BOTOX A.  She denies neck pain, gait difficulty, but with wearing off Botox, she noticed increased head tremor, and pulling towards her right shoulder.  She developed  transient difficulty lifting her neck up when using BOTOX A 200 units previously, reponding well to decreased dose of 150 units  She was enrolled into IPSEN dysport study, first injection was in April 30th 2013, she later enrolled into open label, 2nd injection was in  May 28th 2013. She did very well.  Recent few weeks,  She had experienced flareup of her rheumatoid arthritis, has received IV infusion. Last injection was in 06/10/2012,  500 units of dysport was dissolved into 2 cc of normal saline. Right longissimus capitus 0.5 cc Right splenius capitis 0.5 cc Right levator scapular 0.5 cc Right iliocostalis 0.5 cc   Dysport research study has completed, she is now coming back for repeat injection, she noticed head bobbing over the past few weeks, she denies significant neck pain, weakness,  Her rheumatoid arthritis is under better control   Last injection was in Sep 2014, she did very well, no neck extension weakness, only rarely neck shaking  UPDATE March 8th 2016: She has lost follow-up since her last  visit December 2014, She came in for treatment her cervical dystonia, she feel the tension of her neck all the time, stiffness. Denied gait difficulty, last injection was September 2014, she received 500 units of Dysport, works well for her, she is continue on medication for anxiety, rheumatoid arthritis She also complains of nighttime snoring, frequent awakening, excessive daytime sleepiness, fatigue, today's ESS is 6, FSS was 43,  UPDATE October 13 2014:   She has missed her scheduled sleep study, continue have worsening neck posturing, posterior neck pain, excessive daytime fatigue and sleepiness  UPDATE Nov 11 2014: She came in for EMG guided Dysport injection today, last injection was December 2014, she complains of worsening head titubation, bilateral hands mild postural tremor, posterior neck pain,  Update February 17 2015 She responded very well to last EMG guided Dysport injection Nov 11 2014, no significant head titubation, no significant neck pain  REVIEW OF SYSTEMS: Full 14 system review of systems performed and notable only for as above   ALLERGIES: No Known Allergies  HOME MEDICATIONS: Current Outpatient Prescriptions  Medication Sig Dispense Refill  . AbobotulinumtoxinA (DYSPORT) 500 UNITS SOLR injection Inject 500 Units into the muscle once.    . AMRIX 15 MG 24 hr capsule Take 15 mg by mouth as directed.    . B-D TB SYRINGE 1CC/27GX1/2" 27G X 1/2" 1 ML MISC   1  . baclofen (LIORESAL) 10 MG tablet Take 10 mg by mouth daily as needed.     . clonazePAM (KLONOPIN) 0.5 MG tablet Take 1 tablet (  0.5 mg total) by mouth 2 (two) times daily as needed for anxiety. 60 tablet 5  . DULoxetine (CYMBALTA) 60 MG capsule Take 60 mg by mouth daily.    . folic acid (FOLVITE) 1 MG tablet Take 1 mg by mouth 2 (two) times daily.     Marland Kitchen inFLIXimab (REMICADE) 100 MG injection Inject 100 mg into the vein every 8 (eight) weeks.    . METHOTREXATE SODIUM, PF, IJ Inject 0.3 mLs as directed once a week.      . Multiple Vitamins-Minerals (MULTIVITAMINS THER. W/MINERALS) TABS Take 1 tablet by mouth daily.    Marland Kitchen omeprazole (PRILOSEC) 20 MG capsule TAKE (1) CAPSULE BY MOUTH EVERY DAY. 30 capsule 11  . traMADol (ULTRAM) 50 MG tablet Take 50 mg by mouth every 6 (six) hours as needed for pain.     No current facility-administered medications for this visit.    PAST MEDICAL HISTORY: Past Medical History  Diagnosis Date  . Fibromyalgia   . RA (rheumatoid arthritis)   . GERD (gastroesophageal reflux disease)   . Anxiety   . Depression   . History of shingles   . Synovitis of hand   . Cervical dystonia     PAST SURGICAL HISTORY: Past Surgical History  Procedure Laterality Date  . Tonsillectomy    . Vaginal hysterectomy  3/08    FAMILY HISTORY: Family History  Problem Relation Age of Onset  . Arthritis Mother   . Heart Problems Father   . Heart disease Father   . Heart disease Paternal Uncle     MI    SOCIAL HISTORY:  Social History   Social History  . Marital Status: Married    Spouse Name: [name]  . Number of Children: 2  . Years of Education: 12   Occupational History  .  [employer] History Main Topics  . Smoking status: Current Every Day Smoker -- 0.50 packs/day for 30 years    Types: Cigarettes    Last Attempt to Quit: 07/03/2012  . Smokeless tobacco: Never Used  . Alcohol Use: 0.0 oz/week    0 Standard drinks or equivalent per week     Comment: Rare  . Drug Use: No  . Sexual Activity: Yes    Birth Control/ Protection: Surgical     Comment: 1st intercourse 54 yo-Fewer than 5 partners   Other Topics Concern  . 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 Right handed.    Caffeine- 4 cups daily.     PHYSICAL EXAM   Filed Vitals:   02/17/15 1539  BP: 101/66  Pulse: 88  Height: 5\' 6"  (1.676 m)  Weight: 182 lb (82.555 kg)     Not recorded      Body mass index is 29.39 kg/(m^2).  PHYSICAL EXAMNIATION: MENTAL STATUS: Speech is normal, normal to casual conversation, and history taking  CRANIAL NERVES: mild retrocollis, right tilt, mild right lateral shift  MOTOR: Normal tone and bulk and strength COORDINATION: No dysmetria   GAIT/STANCE: Posture is normal.  DIAGNOSTIC DATA (LABS, IMAGING, TESTING) - I reviewed patient records, labs, notes, testing and imaging myself where available.  Lab Results  Component Value Date   WBC 10.3 02/16/2015   HGB 13.3 02/16/2015   HCT 38.6 02/16/2015   MCV 89.6 02/16/2015   PLT 194 02/16/2015      Component Value Date/Time  NA 139 02/16/2015 0900   K 3.8 02/16/2015 0900   CL 106 02/16/2015 0900   CO2 23 02/16/2015 0900   GLUCOSE 106* 02/16/2015 0900   BUN 9 02/16/2015 0900   CREATININE 0.75 02/16/2015 0900   CALCIUM 9.4 02/16/2015 0900   PROT 6.6 02/16/2015 0900   ALBUMIN 3.6 02/16/2015 0900   AST 26 02/16/2015 0900   ALT 19 02/16/2015 0900   ALKPHOS 80 02/16/2015 0900   BILITOT 0.8 02/16/2015 0900   GFRNONAA >60 02/16/2015 0900   GFRAA >60 02/16/2015 0900   Lab Results  Component Value Date   CHOL 220* 06/18/2013   HDL 52 06/18/2013   LDLCALC 147* 06/18/2013   TRIG 107 06/18/2013   CHOLHDL 4.2 06/18/2013    ASSESSMENT AND PLAN  KAYLYNE AXTON is a 53 y.o. female with long-standing history of cervical dystonia, responded very well to previous EMG guided Dysport injection, last injection was May 2016, responded very well.  500 of Dysport was dissolved into 2 cc of normal saline,  Left levator scapular 0.2 5 cc Right longissimus capitus 0. 25cc  Right inferior oblique capitis, 0.25 cc Left inferior oblique capitis 0.25 cc  Right splenius capitis 0.25 cc Right splenius cervix 0.25 cc  Left splenius capitis 0.25 cc Right splenius cervix 0.25 cc  Patient tolerate the injection well, will return to clinic in 3 months for repeat  injection   Marcial Pacas, M.D. Ph.D.  [facility] [address], [place], [place] [address] Ph: [phone] Fax: [phone]

## 2015-02-17 NOTE — Progress Notes (Signed)
**  Dysport Lot [id number], Exp 10/01/2015, NDC [address]-1**mck,rn

## 2015-04-09 ENCOUNTER — Emergency Department (INDEPENDENT_AMBULATORY_CARE_PROVIDER_SITE_OTHER): Payer: 59

## 2015-04-09 ENCOUNTER — Emergency Department (HOSPITAL_COMMUNITY)
Admission: EM | Admit: 2015-04-09 | Discharge: 2015-04-09 | Disposition: A | Discharge status: Home / Self Care | Payer: 59 | Source: Home / Self Care | Attending: Family Medicine | Admitting: Family Medicine

## 2015-04-09 ENCOUNTER — Encounter (HOSPITAL_COMMUNITY): Payer: Self-pay | Admitting: *Deleted

## 2015-04-09 DIAGNOSIS — R0789 Other chest pain: Secondary | ICD-10-CM | POA: Diagnosis not present

## 2015-04-09 MED ORDER — TRAMADOL HCL 50 MG PO TABS
50.0000 mg | ORAL_TABLET | Freq: Four times a day (QID) | ORAL | Status: DC | PRN
Start: 2015-04-09 — End: 2017-08-13

## 2015-04-09 NOTE — ED Notes (Signed)
Pt  Reports   r  Sided   Chest     Pains   Pain is  Worse     On  Deep  Breath    And  Palpation         Symptoms  begam   Last  Pm          Pt  Is  A  Smoker

## 2015-04-09 NOTE — ED Provider Notes (Signed)
CSN: [id number]     Arrival date & time 04/09/15  1447 History   First MD Initiated Contact with Patient 04/09/15 1614     Chief Complaint  Patient presents with  . Chest Pain   (Consider location/radiation/quality/duration/timing/severity/associated sxs/prior Treatment) Patient is a 54 y.o. female presenting with chest pain. The history is provided by the patient.  Chest Pain Pain location:  R chest Pain quality: sharp   Pain radiates to:  Does not radiate Pain radiates to the back: no   Pain severity:  Mild Onset quality:  Sudden (awoke from sleep at 3am, off and on since.) Progression:  Waxing and waning Chronicity:  New Worsened by:  Certain positions and deep breathing Ineffective treatments:  None tried Associated symptoms: no abdominal pain, no cough, no fever, no lower extremity edema, no palpitations and no shortness of breath   Risk factors: no prior DVT/PE   Risk factors comment:  Fibromyalgia   Past Medical History  Diagnosis Date  . Fibromyalgia   . RA (rheumatoid arthritis) ([place])   . GERD (gastroesophageal reflux disease)   . Anxiety   . Depression   . History of shingles   . Synovitis of hand   . Cervical dystonia    Past Surgical History  Procedure Laterality Date  . Tonsillectomy    . Vaginal hysterectomy  3/08   Family History  Problem Relation Age of Onset  . Arthritis Mother   . Heart Problems Father   . Heart disease Father   . Heart disease Paternal Uncle     MI   Social History  Substance Use Topics  . Smoking status: Current Every Day Smoker -- 0.50 packs/day for 30 years    Types: Cigarettes    Last Attempt to Quit: 07/03/2012  . Smokeless tobacco: Never Used  . Alcohol Use: 0.0 oz/week    0 Standard drinks or equivalent per week     Comment: Rare   OB History    Gravida Para Term Preterm AB TAB SAB Ectopic Multiple Living   4 2  2 2  1   2      Review of Systems  Constitutional: Negative.  Negative for fever.  Respiratory:  Negative for cough and shortness of breath.   Cardiovascular: Positive for chest pain. Negative for palpitations and leg swelling.  Gastrointestinal: Negative for abdominal pain.  All other systems reviewed and are negative.   Allergies  Review of patient's allergies indicates no known allergies.  Home Medications   Prior to Admission medications   Medication Sig Start Date End Date Taking? Authorizing Provider  AbobotulinumtoxinA (DYSPORT) 500 UNITS SOLR injection Inject 500 Units into the muscle once.    Historical Provider, MD  AMRIX 15 MG 24 hr capsule Take 15 mg by mouth as directed. 02/25/13   Historical Provider, MD  B-D TB SYRINGE 1CC/27GX1/2" 27G X 1/2" 1 ML MISC  07/16/14   Historical Provider, MD  baclofen (LIORESAL) 10 MG tablet Take 10 mg by mouth daily as needed.  05/01/13   Historical Provider, MD  clonazePAM (KLONOPIN) 0.5 MG tablet Take 1 tablet (0.5 mg total) by mouth 2 (two) times daily as needed for anxiety. 01/25/15   Marcial Pacas, MD  DULoxetine (CYMBALTA) 60 MG capsule Take 60 mg by mouth daily.    Historical Provider, MD  folic acid (FOLVITE) 1 MG tablet Take 1 mg by mouth 2 (two) times daily.     Historical Provider, MD  inFLIXimab (REMICADE) 100 MG injection Inject  100 mg into the vein every 8 (eight) weeks.    Historical Provider, MD  METHOTREXATE SODIUM, PF, IJ Inject 0.3 mLs as directed once a week.    Historical Provider, MD  Multiple Vitamins-Minerals (MULTIVITAMINS THER. W/MINERALS) TABS Take 1 tablet by mouth daily.    Historical Provider, MD  omeprazole (PRILOSEC) 20 MG capsule TAKE (1) CAPSULE BY MOUTH EVERY DAY. 07/21/14   Susy Frizzle, MD  traMADol (ULTRAM) 50 MG tablet Take 1 tablet (50 mg total) by mouth every 6 (six) hours as needed. For pain 04/09/15   Billy Fischer, MD   Meds Ordered and Administered this Visit  Medications - No data to display  BP 130/78 mmHg  Pulse 91  Temp(Src) 98 F (36.7 C) (Oral)  Resp 16  SpO2 100% No data  found.   Physical Exam  Constitutional: She is oriented to person, place, and time. She appears well-developed and well-nourished.  Neck: Normal range of motion. Neck supple.  Cardiovascular: Normal rate, normal heart sounds and intact distal pulses.   Pulmonary/Chest: Effort normal and breath sounds normal. No respiratory distress. She exhibits tenderness.  Sore with deep breath and to palp and certain positions otherwise no pain or distress.  Neurological: She is alert and oriented to person, place, and time.  Skin: Skin is warm and dry.  Nursing note and vitals reviewed.   ED Course  Procedures (including critical care time)  Labs Review Labs Reviewed - No data to display  Imaging Review Dg Chest 2 View  04/09/2015   CLINICAL DATA:  Right-sided chest pain, smoking history  EXAM: CHEST  2 VIEW  COMPARISON:  None.  FINDINGS: No active infiltrate or effusion is seen. Mediastinal and hilar contours are unremarkable. The heart is within normal limits in size. No bony abnormality is seen.  IMPRESSION: No active cardiopulmonary disease.   Electronically Signed   By: Ivar Drape M.D.   On: 04/09/2015 16:41    X-rays reviewed and report per radiologist.  Visual Acuity Review  Right Eye Distance:   Left Eye Distance:   Bilateral Distance:    Right Eye Near:   Left Eye Near:    Bilateral Near:         MDM   1. Right-sided chest wall pain    rx ultram for cp.    Billy Fischer, MD 04/09/15 1710

## 2015-04-09 NOTE — Discharge Instructions (Signed)
Heat and medicine and activity as tolerated, see your doctor if further problems

## 2015-04-13 ENCOUNTER — Encounter (HOSPITAL_COMMUNITY)
Admission: RE | Admit: 2015-04-13 | Discharge: 2015-04-13 | Disposition: A | Discharge status: Home / Self Care | Payer: 59 | Source: Ambulatory Visit | Attending: Rheumatology | Admitting: Rheumatology

## 2015-04-13 DIAGNOSIS — M069 Rheumatoid arthritis, unspecified: Secondary | ICD-10-CM | POA: Insufficient documentation

## 2015-04-13 LAB — COMPREHENSIVE METABOLIC PANEL
ALT: 23 U/L (ref 14–54)
AST: 26 U/L (ref 15–41)
Albumin: 3.7 g/dL (ref 3.5–5.0)
Alkaline Phosphatase: 94 U/L (ref 38–126)
Anion gap: 7 (ref 5–15)
BUN: 9 mg/dL (ref 6–20)
CO2: 28 mmol/L (ref 22–32)
Calcium: 9.1 mg/dL (ref 8.9–10.3)
Chloride: 104 mmol/L (ref 101–111)
Creatinine, Ser: 0.76 mg/dL (ref 0.44–1.00)
GFR calc Af Amer: >60 mL/min (ref >60)
GFR calc non Af Amer: >60 mL/min (ref >60)
Glucose, Bld: 98 mg/dL (ref 65–99)
Potassium: 4.3 mmol/L (ref 3.5–5.1)
Sodium: 139 mmol/L (ref 135–145)
Total Bilirubin: 0.5 mg/dL (ref 0.3–1.2)
Total Protein: 7 g/dL (ref 6.5–8.1)

## 2015-04-13 LAB — CBC WITH DIFFERENTIAL/PLATELET
Basophils Absolute: 0.1 10*3/uL (ref 0.0–0.1)
Basophils Relative: 1 %
Eosinophils Absolute: 0.2 10*3/uL (ref 0.0–0.7)
Eosinophils Relative: 3 %
HCT: 41.6 % (ref 36.0–46.0)
Hemoglobin: 13.9 g/dL (ref 12.0–15.0)
Lymphocytes Relative: 31 %
Lymphs Abs: 2.4 10*3/uL (ref 0.7–4.0)
MCH: 31 pg (ref 26.0–34.0)
MCHC: 33.4 g/dL (ref 30.0–36.0)
MCV: 92.7 fL (ref 78.0–100.0)
Monocytes Absolute: 0.5 10*3/uL (ref 0.1–1.0)
Monocytes Relative: 7 %
Neutro Abs: 4.6 10*3/uL (ref 1.7–7.7)
Neutrophils Relative %: 58 %
Platelets: 193 10*3/uL (ref 150–400)
RBC: 4.49 MIL/uL (ref 3.87–5.11)
RDW: 14.3 % (ref 11.5–15.5)
WBC: 7.8 10*3/uL (ref 4.0–10.5)

## 2015-04-13 MED ORDER — ACETAMINOPHEN 325 MG PO TABS: 650.0000 mg | ORAL_TABLET | Freq: Once | ORAL | Status: DC

## 2015-04-13 MED ORDER — DIPHENHYDRAMINE HCL 25 MG PO TABS
25.0000 mg | ORAL_TABLET | Freq: Once | ORAL | Status: DC
  Filled 2015-04-13: qty 1

## 2015-04-13 MED ORDER — SODIUM CHLORIDE 0.9 % IV SOLN
INTRAVENOUS | Status: DC
  Administered 2015-04-13: 09:00:00 via INTRAVENOUS

## 2015-04-13 MED ORDER — SODIUM CHLORIDE 0.9 % IV SOLN
500.0000 mg | INTRAVENOUS | Status: DC
  Administered 2015-04-13: 500 mg via INTRAVENOUS
  Filled 2015-04-13: qty 50

## 2015-04-14 ENCOUNTER — Encounter: Payer: Self-pay | Admitting: Physician Assistant

## 2015-04-14 ENCOUNTER — Ambulatory Visit (INDEPENDENT_AMBULATORY_CARE_PROVIDER_SITE_OTHER): Payer: 59 | Admitting: Physician Assistant

## 2015-04-14 VITALS — BP 124/86 | HR 84 | Temp 97.7°F | Resp 18 | Wt 180.0 lb

## 2015-04-14 DIAGNOSIS — M069 Rheumatoid arthritis, unspecified: Secondary | ICD-10-CM

## 2015-04-14 DIAGNOSIS — L03211 Cellulitis of face: Secondary | ICD-10-CM | POA: Diagnosis not present

## 2015-04-14 MED ORDER — CIPROFLOXACIN HCL 500 MG PO TABS: 500.0000 mg | ORAL_TABLET | Freq: Two times a day (BID) | ORAL | Status: DC

## 2015-04-14 NOTE — Progress Notes (Signed)
Patient ID: Courtney Myers MRN: [id number], DOB: 1961/05/18, 54 y.o. Date of Encounter: 04/14/2015, 5:09 PM    Chief Complaint:  Chief Complaint  Patient presents with  . infection on left outer ear  . Flu Vaccine    if OK with RA meds     HPI: 54 y.o. year old white female presents with above.  She brings in paperwork that she has kept [facility]. She was diagnosed with Perichondritis of auricle, right and Otalgia, right. She was treated with Cipro 500 mg twice a day 10 days.  She states that at that time she was having the exact same problem with the right ear as she is now having with her left ear.  She states that at that time the Cipro worked very well and the problems with her ear resolved after just the first day of Cipro.  She states that she is aware not to take methotrexate while on Cipro.  She is concerned that this is happening and does not know why it is happening and we discussed the fact that she is on immunosuppressives. She is going to keep track of these infections and other problems she has had and discuss them with her Rheumatologist to make sure they are aware.      Home Meds:   Outpatient Prescriptions Prior to Visit  Medication Sig Dispense Refill  . AbobotulinumtoxinA (DYSPORT) 500 UNITS SOLR injection Inject 500 Units into the muscle once.    . AMRIX 15 MG 24 hr capsule Take 15 mg by mouth as directed.    . B-D TB SYRINGE 1CC/27GX1/2" 27G X 1/2" 1 ML MISC   1  . baclofen (LIORESAL) 10 MG tablet Take 10 mg by mouth daily as needed.     . clonazePAM (KLONOPIN) 0.5 MG tablet Take 1 tablet (0.5 mg total) by mouth 2 (two) times daily as needed for anxiety. 60 tablet 5  . DULoxetine (CYMBALTA) 60 MG capsule Take 60 mg by mouth daily.    . folic acid (FOLVITE) 1 MG tablet Take 1 mg by mouth 2 (two) times daily.     Marland Kitchen inFLIXimab (REMICADE) 100 MG injection Inject 100 mg into the vein every 8 (eight) weeks.    . METHOTREXATE  SODIUM, PF, IJ Inject 0.3 mLs as directed once a week.    . Multiple Vitamins-Minerals (MULTIVITAMINS THER. W/MINERALS) TABS Take 1 tablet by mouth daily.    Marland Kitchen omeprazole (PRILOSEC) 20 MG capsule TAKE (1) CAPSULE BY MOUTH EVERY DAY. 30 capsule 11  . traMADol (ULTRAM) 50 MG tablet Take 1 tablet (50 mg total) by mouth every 6 (six) hours as needed. For pain 20 tablet 0   No facility-administered medications prior to visit.    Allergies: No Known Allergies    Review of Systems: See HPI for pertinent ROS. All other ROS negative.    Physical Exam: Blood pressure 124/86, pulse 84, temperature 97.7 F (36.5 C), temperature source Oral, resp. rate 18, weight 180 lb (81.647 kg)., Body mass index is 29.07 kg/(m^2). General: WNWD WF.  Appears in no acute distress. HEENT: Normocephalic, atraumatic, eyes without discharge, sclera non-icteric, nares are without discharge. Right Ear: Normal.  Left Ear: Upper portion of external ear with diffuse erythema. No abscess.                Ear canal and TM normal.  Oral cavity moist, posterior pharynx without exudate, erythema, peritonsillar abscess, or post nasal drip.  Neck: Supple. No thyromegaly. No lymphadenopathy. Lungs: Clear bilaterally to auscultation without wheezes, rales, or rhonchi. Breathing is unlabored. Heart: Regular rhythm. No murmurs, rubs, or gallops. Msk:  Strength and tone normal for age. Extremities/Skin: Warm and dry. Neuro: Alert and oriented X 3. Moves all extremities spontaneously. Gait is normal. CNII-XII grossly in tact. Psych:  Responds to questions appropriately with a normal affect.     ASSESSMENT AND PLAN:  54 y.o. year old female with  1. Cellulitis of face She is to start the Cipro immediately take as directed and complete all of it. To hold the methotrexate while on Cipro. She is to follow-up immediately if the ear worsens or does not return to normal upon completion of the Cipro. As documented in history of present  illness, she is going to keep records of these infections and other illnesses she has had and schedule follow-up with her rheumatologist to discuss this. - ciprofloxacin (CIPRO) 500 MG tablet; Take 1 tablet (500 mg total) by mouth 2 (two) times daily.  Dispense: 20 tablet; Refill: 0  2. Rheumatoid arthritis, involving unspecified site, unspecified rheumatoid factor presence ([place]) See above.   [address] [place], [place], [facility] 04/14/2015 5:09 PM

## 2015-05-20 ENCOUNTER — Ambulatory Visit: Payer: 59 | Admitting: Neurology

## 2015-06-03 ENCOUNTER — Telehealth: Payer: Self-pay | Admitting: Neurology

## 2015-06-03 NOTE — Telephone Encounter (Signed)
I called [facility]. I held for over 11 minutes for a pharmacist and had to hang up to help patients. I will call back.

## 2015-06-03 NOTE — Telephone Encounter (Signed)
[facility] [phone] needs 3 things 1. The route of administration 2. Specific number of units to be injected and 3.  The injection site.

## 2015-06-07 ENCOUNTER — Other Ambulatory Visit (HOSPITAL_COMMUNITY): Payer: Self-pay | Admitting: *Deleted

## 2015-06-08 ENCOUNTER — Encounter (HOSPITAL_COMMUNITY)
Admission: RE | Admit: 2015-06-08 | Discharge: 2015-06-08 | Disposition: A | Discharge status: Home / Self Care | Payer: 59 | Source: Ambulatory Visit | Attending: Rheumatology | Admitting: Rheumatology

## 2015-06-08 DIAGNOSIS — M069 Rheumatoid arthritis, unspecified: Secondary | ICD-10-CM | POA: Insufficient documentation

## 2015-06-08 LAB — COMPREHENSIVE METABOLIC PANEL
ALT: 25 U/L (ref 14–54)
AST: 33 U/L (ref 15–41)
Albumin: 3.7 g/dL (ref 3.5–5.0)
Alkaline Phosphatase: 82 U/L (ref 38–126)
Anion gap: 6 (ref 5–15)
BUN: 12 mg/dL (ref 6–20)
CO2: 26 mmol/L (ref 22–32)
Calcium: 9.4 mg/dL (ref 8.9–10.3)
Chloride: 104 mmol/L (ref 101–111)
Creatinine, Ser: 0.75 mg/dL (ref 0.44–1.00)
GFR calc Af Amer: >60 mL/min (ref >60)
GFR calc non Af Amer: >60 mL/min (ref >60)
Glucose, Bld: 124 mg/dL — ABNORMAL HIGH (ref 65–99)
Potassium: 4.3 mmol/L (ref 3.5–5.1)
Sodium: 136 mmol/L (ref 135–145)
Total Bilirubin: 0.7 mg/dL (ref 0.3–1.2)
Total Protein: 6.9 g/dL (ref 6.5–8.1)

## 2015-06-08 LAB — CBC WITH DIFFERENTIAL/PLATELET
Basophils Absolute: 0.1 10*3/uL (ref 0.0–0.1)
Basophils Relative: 1 %
Eosinophils Absolute: 0.2 10*3/uL (ref 0.0–0.7)
Eosinophils Relative: 3 %
HCT: 41.2 % (ref 36.0–46.0)
Hemoglobin: 13.4 g/dL (ref 12.0–15.0)
Lymphocytes Relative: 29 %
Lymphs Abs: 2.5 10*3/uL (ref 0.7–4.0)
MCH: 30 pg (ref 26.0–34.0)
MCHC: 32.5 g/dL (ref 30.0–36.0)
MCV: 92.2 fL (ref 78.0–100.0)
Monocytes Absolute: 0.6 10*3/uL (ref 0.1–1.0)
Monocytes Relative: 6 %
Neutro Abs: 5.5 10*3/uL (ref 1.7–7.7)
Neutrophils Relative %: 61 %
Platelets: 227 10*3/uL (ref 150–400)
RBC: 4.47 MIL/uL (ref 3.87–5.11)
RDW: 14.4 % (ref 11.5–15.5)
WBC: 8.9 10*3/uL (ref 4.0–10.5)

## 2015-06-08 MED ORDER — SODIUM CHLORIDE 0.9 % IV SOLN
500.0000 mg | INTRAVENOUS | Status: DC
  Administered 2015-06-08: 500 mg via INTRAVENOUS
  Filled 2015-06-08: qty 50

## 2015-06-08 MED ORDER — ACETAMINOPHEN 325 MG PO TABS: 650.0000 mg | ORAL_TABLET | ORAL | Status: DC

## 2015-06-08 MED ORDER — DIPHENHYDRAMINE HCL 25 MG PO CAPS: 25.0000 mg | ORAL_CAPSULE | ORAL | Status: DC

## 2015-06-09 NOTE — Telephone Encounter (Signed)
I called [facility] and answered the questions they had about the patient's Dysport. In answering the questions, I realized this patient has been scheduled 1 month too soon for her next injection. The pharmacist at [facility] advised that we contact them when refill is needed, since appointment will change.

## 2015-07-15 ENCOUNTER — Other Ambulatory Visit: Payer: Self-pay

## 2015-07-15 DIAGNOSIS — Z1231 Encounter for screening mammogram for malignant neoplasm of breast: Secondary | ICD-10-CM

## 2015-07-22 ENCOUNTER — Ambulatory Visit (INDEPENDENT_AMBULATORY_CARE_PROVIDER_SITE_OTHER): Payer: 59 | Admitting: Neurology

## 2015-07-22 ENCOUNTER — Encounter: Payer: Self-pay | Admitting: Neurology

## 2015-07-22 VITALS — BP 109/75 | HR 88 | Ht 66.0 in | Wt 181.0 lb

## 2015-07-22 DIAGNOSIS — G243 Spasmodic torticollis: Secondary | ICD-10-CM

## 2015-07-22 NOTE — Progress Notes (Signed)
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  Symptom onset was without apparent triggering event, she has gradually developed this neck pulling,  manifested primarily with right laterocollis, mild retrocolis, and left shoulder elevation since 2000. She has good relief with regular Botox injection, about 4 times a year since 2003. Last injection was February 18, 2010 by Dr. Marta Antu,  She reported great relief as usual, taking 4- 5 days to get symptom relieve, lasting about 3 months.  I began to inject her since 12.2011, every 3-4 months, dosage has been limited to 150 units of BOTOX A.  She denies neck pain, gait difficulty, but with wearing off Botox, she noticed increased head tremor, and pulling towards her right shoulder.  She developed  transient difficulty lifting her neck up when using BOTOX A 200 units previously, reponding well to decreased dose of 150 units  She was enrolled into IPSEN dysport study, first injection was in April 30th 2013, she later enrolled into open label, 2nd injection was in  May 28th 2013. She did very well.  Recent few weeks,  She had experienced flareup of her rheumatoid arthritis, has received IV infusion. Last injection was in 06/10/2012,  500 units of dysport was dissolved into 2 cc of normal saline. Right longissimus capitus 0.5 cc Right splenius capitis 0.5 cc Right levator scapular 0.5 cc Right iliocostalis 0.5 cc   Dysport research study has completed, she is now coming back for repeat injection, she noticed head bobbing over the past few weeks, she denies significant neck pain, weakness,  Her rheumatoid arthritis is under better control   Last injection was in Sep 2014, she did very well, no neck extension weakness, only rarely neck shaking  UPDATE March 8th 2016: She has lost follow-up since her last visit December 2014,  She came in for treatment her cervical dystonia, she feel the tension of her neck all the time, stiffness. Denied gait difficulty, last injection was September 2014, she received 500 units of Dysport, works well for her, she is continue on medication for anxiety, rheumatoid arthritis She also complains of nighttime snoring, frequent awakening, excessive daytime sleepiness, fatigue, today's ESS is 6, FSS was 73,  UPDATE October 13 2014:   She has missed her scheduled sleep study, continue have worsening neck posturing, posterior neck pain, excessive daytime fatigue and sleepiness  UPDATE Nov 11 2014: She came in for EMG guided Dysport injection today, last injection was December 2014, she complains of worsening head titubation, bilateral hands mild postural tremor, posterior neck pain,  Update February 17 2015 She responded very well to last EMG guided Dysport injection Nov 11 2014, no significant head titubation, no significant neck pain  Update July 22 2015: Last injection was August, now she noticed returning of neck pulling and shaking, posterior neck muscle achy pain,  REVIEW OF SYSTEMS: Full 14 system review of systems performed and notable only for as above   ALLERGIES: No Known Allergies  HOME MEDICATIONS: Current Outpatient Prescriptions  Medication Sig Dispense Refill  . AbobotulinumtoxinA (DYSPORT) 500 UNITS SOLR injection Inject 500 Units into the muscle once.    . AMRIX 15 MG 24 hr capsule Take 15 mg by mouth as directed.    . B-D TB SYRINGE 1CC/27GX1/2" 27G X 1/2" 1 ML MISC   1  . baclofen (LIORESAL) 10 MG tablet Take 10  mg by mouth daily as needed.     . clonazePAM (KLONOPIN) 0.5 MG tablet Take 1 tablet (0.5 mg total) by mouth 2 (two) times daily as needed for anxiety. 60 tablet 5  . DULoxetine (CYMBALTA) 60 MG capsule Take 60 mg by mouth daily.    . folic acid (FOLVITE) 1 MG tablet Take 1 mg by mouth 2 (two) times daily.     Marland Kitchen inFLIXimab (REMICADE) 100 MG injection Inject  100 mg into the vein every 8 (eight) weeks.    . METHOTREXATE SODIUM, PF, IJ Inject 0.3 mLs as directed once a week.    . Multiple Vitamins-Minerals (MULTIVITAMINS THER. W/MINERALS) TABS Take 1 tablet by mouth daily.    . Omega-3 Fatty Acids (FISH OIL PO) Take 1,500 mg by mouth.    Marland Kitchen omeprazole (PRILOSEC) 20 MG capsule TAKE (1) CAPSULE BY MOUTH EVERY DAY. 30 capsule 11  . traMADol (ULTRAM) 50 MG tablet Take 1 tablet (50 mg total) by mouth every 6 (six) hours as needed. For pain 20 tablet 0   No current facility-administered medications for this visit.    PAST MEDICAL HISTORY: Past Medical History  Diagnosis Date  . Fibromyalgia   . RA (rheumatoid arthritis) ([place])   . GERD (gastroesophageal reflux disease)   . Anxiety   . Depression   . History of shingles   . Synovitis of hand   . Cervical dystonia     PAST SURGICAL HISTORY: Past Surgical History  Procedure Laterality Date  . Tonsillectomy    . Vaginal hysterectomy  3/08    FAMILY HISTORY: Family History  Problem Relation Age of Onset  . Arthritis Mother   . Heart Problems Father   . Heart disease Father   . Heart disease Paternal Uncle     MI    SOCIAL HISTORY:  Social History   Social History  . Marital Status: Married    Spouse Name: [name]  . Number of Children: 2  . Years of Education: 12   Occupational History  .  [place] History Main Topics  . Smoking status: Current Every Day Smoker -- 0.50 packs/day for 30 years    Types: Cigarettes    Last Attempt to Quit: 07/03/2012  . Smokeless tobacco: Never Used  . Alcohol Use: 0.0 oz/week    0 Standard drinks or equivalent per week     Comment: Rare  . Drug Use: No  . Sexual Activity: Yes    Birth Control/ Protection: Surgical     Comment: 1st intercourse 55 yo-Fewer than 5 partners   Other Topics Concern  . 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 Right handed.    Caffeine- 4 cups daily.     PHYSICAL EXAM   Filed Vitals:   07/22/15 0732  BP: 109/75  Pulse: 88  Height: 5\' 6"  (1.676 m)  Weight: 181 lb (82.101 kg)    Not recorded      Body mass index is 29.23 kg/(m^2).  PHYSICAL EXAMNIATION: MENTAL STATUS: Speech is normal, normal to casual conversation, and history taking  CRANIAL NERVES: mild retrocollis, right tilt, mild right lateral shift, slight left shoulder elevation  MOTOR: Normal tone and bulk and strength COORDINATION: No dysmetria   GAIT/STANCE: Posture is normal.  DIAGNOSTIC DATA (LABS, IMAGING, TESTING) - I reviewed patient records, labs, notes, testing and imaging myself  where available.  ASSESSMENT AND PLAN  Courtney Myers is a 55 y.o. female with long-standing history of cervical dystonia, responded very well to previous EMG guided Dysport injection, last injection was May 2016, responded very well.  Mild retrocollis, right tilt, mild right lateral shift, slight left shoulder elevation  500 of Dysport was dissolved into 2 cc of normal saline,  Left levator scapular 0.2 5 cc Right longissimus capitus 0. 25cc  Right inferior oblique capitis, 0.25 cc Left inferior oblique capitis 0.25 cc  Right splenius capitis 0.25 cc Right splenius cervix 0.25 cc  Left splenius capitis 0.25 cc Right splenius cervix 0.25 cc  Patient tolerate the injection well, will return to clinic in 3 months for repeat injection   Marcial Pacas, M.D. Ph.D.  [facility] [address] [place], [place] [address] Ph: [phone] Fax: [phone]

## 2015-07-22 NOTE — Progress Notes (Signed)
**  Dysport 500units - Lot [id number], Exp 12/31/15, [place], specialty pharmacy**mck

## 2015-07-30 ENCOUNTER — Ambulatory Visit
Admission: RE | Admit: 2015-07-30 | Discharge: 2015-07-30 | Disposition: A | Discharge status: Home / Self Care | Payer: 59 | Source: Ambulatory Visit

## 2015-07-30 ENCOUNTER — Encounter: Payer: Self-pay | Admitting: Women's Health

## 2015-07-30 DIAGNOSIS — Z1231 Encounter for screening mammogram for malignant neoplasm of breast: Secondary | ICD-10-CM

## 2015-08-02 ENCOUNTER — Other Ambulatory Visit (HOSPITAL_COMMUNITY): Payer: Self-pay | Admitting: *Deleted

## 2015-08-03 ENCOUNTER — Encounter (HOSPITAL_COMMUNITY)
Admission: RE | Admit: 2015-08-03 | Discharge: 2015-08-03 | Disposition: A | Discharge status: Home / Self Care | Payer: 59 | Source: Ambulatory Visit | Attending: Rheumatology | Admitting: Rheumatology

## 2015-08-03 DIAGNOSIS — M069 Rheumatoid arthritis, unspecified: Secondary | ICD-10-CM | POA: Diagnosis not present

## 2015-08-03 LAB — CBC WITH DIFFERENTIAL/PLATELET
Basophils Absolute: 0.1 10*3/uL (ref 0.0–0.1)
Basophils Relative: 1 %
Eosinophils Absolute: 0.2 10*3/uL (ref 0.0–0.7)
Eosinophils Relative: 3 %
HCT: 39.5 % (ref 36.0–46.0)
Hemoglobin: 13.2 g/dL (ref 12.0–15.0)
Lymphocytes Relative: 36 %
Lymphs Abs: 2.6 10*3/uL (ref 0.7–4.0)
MCH: 30.3 pg (ref 26.0–34.0)
MCHC: 33.4 g/dL (ref 30.0–36.0)
MCV: 90.6 fL (ref 78.0–100.0)
Monocytes Absolute: 0.5 10*3/uL (ref 0.1–1.0)
Monocytes Relative: 7 %
Neutro Abs: 3.9 10*3/uL (ref 1.7–7.7)
Neutrophils Relative %: 53 %
Platelets: 234 10*3/uL (ref 150–400)
RBC: 4.36 MIL/uL (ref 3.87–5.11)
RDW: 13.8 % (ref 11.5–15.5)
WBC: 7.3 10*3/uL (ref 4.0–10.5)

## 2015-08-03 LAB — COMPREHENSIVE METABOLIC PANEL
ALT: 30 U/L (ref 14–54)
AST: 26 U/L (ref 15–41)
Albumin: 3.6 g/dL (ref 3.5–5.0)
Alkaline Phosphatase: 81 U/L (ref 38–126)
Anion gap: 7 (ref 5–15)
BUN: 10 mg/dL (ref 6–20)
CO2: 24 mmol/L (ref 22–32)
Calcium: 9.2 mg/dL (ref 8.9–10.3)
Chloride: 107 mmol/L (ref 101–111)
Creatinine, Ser: 0.74 mg/dL (ref 0.44–1.00)
GFR calc Af Amer: >60 mL/min (ref >60)
GFR calc non Af Amer: >60 mL/min (ref >60)
Glucose, Bld: 121 mg/dL — ABNORMAL HIGH (ref 65–99)
Potassium: 3.9 mmol/L (ref 3.5–5.1)
Sodium: 138 mmol/L (ref 135–145)
Total Bilirubin: 0.6 mg/dL (ref 0.3–1.2)
Total Protein: 6.9 g/dL (ref 6.5–8.1)

## 2015-08-03 MED ORDER — DIPHENHYDRAMINE HCL 25 MG PO CAPS: 25.0000 mg | ORAL_CAPSULE | ORAL | Status: DC

## 2015-08-03 MED ORDER — SODIUM CHLORIDE 0.9 % IV SOLN
500.0000 mg | INTRAVENOUS | Status: DC
  Administered 2015-08-03: 500 mg via INTRAVENOUS
  Filled 2015-08-03: qty 50

## 2015-08-03 MED ORDER — SODIUM CHLORIDE 0.9 % IV SOLN
INTRAVENOUS | Status: DC
  Administered 2015-08-03: 250 mL via INTRAVENOUS

## 2015-08-03 MED ORDER — ACETAMINOPHEN 325 MG PO TABS: 650.0000 mg | ORAL_TABLET | ORAL | Status: DC

## 2015-08-05 ENCOUNTER — Encounter: Payer: 59 | Admitting: Women's Health

## 2015-08-12 ENCOUNTER — Other Ambulatory Visit: Payer: Self-pay | Admitting: Family Medicine

## 2015-08-12 DIAGNOSIS — K219 Gastro-esophageal reflux disease without esophagitis: Secondary | ICD-10-CM

## 2015-08-12 MED ORDER — OMEPRAZOLE 20 MG PO CPDR: DELAYED_RELEASE_CAPSULE | ORAL | Status: DC

## 2015-08-12 NOTE — Telephone Encounter (Signed)
Medication called/sent to requested pharmacy  

## 2015-08-24 ENCOUNTER — Emergency Department (HOSPITAL_COMMUNITY): Payer: 59

## 2015-08-24 ENCOUNTER — Encounter (HOSPITAL_COMMUNITY): Payer: Self-pay | Admitting: *Deleted

## 2015-08-24 ENCOUNTER — Emergency Department (HOSPITAL_COMMUNITY)
Admission: EM | Admit: 2015-08-24 | Discharge: 2015-08-24 | Disposition: A | Discharge status: Home / Self Care | Payer: 59 | Attending: Emergency Medicine | Admitting: Emergency Medicine

## 2015-08-24 DIAGNOSIS — M549 Dorsalgia, unspecified: Secondary | ICD-10-CM | POA: Diagnosis present

## 2015-08-24 DIAGNOSIS — F17210 Nicotine dependence, cigarettes, uncomplicated: Secondary | ICD-10-CM | POA: Diagnosis not present

## 2015-08-24 DIAGNOSIS — F419 Anxiety disorder, unspecified: Secondary | ICD-10-CM | POA: Diagnosis not present

## 2015-08-24 DIAGNOSIS — R509 Fever, unspecified: Secondary | ICD-10-CM | POA: Diagnosis not present

## 2015-08-24 DIAGNOSIS — Z8619 Personal history of other infectious and parasitic diseases: Secondary | ICD-10-CM | POA: Diagnosis not present

## 2015-08-24 DIAGNOSIS — R0602 Shortness of breath: Secondary | ICD-10-CM | POA: Diagnosis not present

## 2015-08-24 DIAGNOSIS — F329 Major depressive disorder, single episode, unspecified: Secondary | ICD-10-CM | POA: Diagnosis not present

## 2015-08-24 DIAGNOSIS — M797 Fibromyalgia: Secondary | ICD-10-CM | POA: Diagnosis not present

## 2015-08-24 DIAGNOSIS — Z79899 Other long term (current) drug therapy: Secondary | ICD-10-CM | POA: Diagnosis not present

## 2015-08-24 DIAGNOSIS — K219 Gastro-esophageal reflux disease without esophagitis: Secondary | ICD-10-CM | POA: Insufficient documentation

## 2015-08-24 LAB — URINALYSIS, ROUTINE W REFLEX MICROSCOPIC
Bilirubin Urine: NEGATIVE
Glucose, UA: NEGATIVE mg/dL
Ketones, ur: NEGATIVE mg/dL
Leukocytes, UA: NEGATIVE
Nitrite: NEGATIVE
Protein, ur: NEGATIVE mg/dL
Specific Gravity, Urine: 1.003 — ABNORMAL LOW (ref 1.005–1.030)
pH: 6.5 (ref 5.0–8.0)

## 2015-08-24 LAB — CBC
HCT: 42.3 % (ref 36.0–46.0)
Hemoglobin: 13.8 g/dL (ref 12.0–15.0)
MCH: 30 pg (ref 26.0–34.0)
MCHC: 32.6 g/dL (ref 30.0–36.0)
MCV: 92 fL (ref 78.0–100.0)
Platelets: 221 10*3/uL (ref 150–400)
RBC: 4.6 MIL/uL (ref 3.87–5.11)
RDW: 13.8 % (ref 11.5–15.5)
WBC: 8.2 10*3/uL (ref 4.0–10.5)

## 2015-08-24 LAB — BASIC METABOLIC PANEL
Anion gap: 11 (ref 5–15)
BUN: 8 mg/dL (ref 6–20)
CO2: 25 mmol/L (ref 22–32)
Calcium: 9.8 mg/dL (ref 8.9–10.3)
Chloride: 104 mmol/L (ref 101–111)
Creatinine, Ser: 0.74 mg/dL (ref 0.44–1.00)
GFR calc Af Amer: >60 mL/min (ref >60)
GFR calc non Af Amer: >60 mL/min (ref >60)
Glucose, Bld: 101 mg/dL — ABNORMAL HIGH (ref 65–99)
Potassium: 4.3 mmol/L (ref 3.5–5.1)
Sodium: 140 mmol/L (ref 135–145)

## 2015-08-24 LAB — I-STAT TROPONIN, ED: Troponin i, poc: 0 ng/mL (ref 0.00–0.08)

## 2015-08-24 LAB — URINE MICROSCOPIC-ADD ON

## 2015-08-24 MED ORDER — KETOROLAC TROMETHAMINE 60 MG/2ML IM SOLN
60.0000 mg | Freq: Once | INTRAMUSCULAR | Status: AC
  Administered 2015-08-24: 60 mg via INTRAMUSCULAR
  Filled 2015-08-24: qty 2

## 2015-08-24 MED ORDER — IBUPROFEN 600 MG PO TABS: 600.0000 mg | ORAL_TABLET | Freq: Four times a day (QID) | ORAL | Status: DC | PRN

## 2015-08-24 MED ORDER — METHOCARBAMOL 500 MG PO TABS: 500.0000 mg | ORAL_TABLET | Freq: Two times a day (BID) | ORAL | Status: DC

## 2015-08-24 MED ORDER — METHOCARBAMOL 500 MG PO TABS
500.0000 mg | ORAL_TABLET | Freq: Once | ORAL | Status: AC
  Administered 2015-08-24: 500 mg via ORAL
  Filled 2015-08-24: qty 1

## 2015-08-24 MED ORDER — KETOROLAC TROMETHAMINE 30 MG/ML IJ SOLN: 30.0000 mg | Freq: Once | INTRAMUSCULAR | Status: DC

## 2015-08-24 NOTE — ED Notes (Signed)
Pt is here with mid back pain that started yesterday and reports the pain is making her breathing different.  NO cough.  Reports 99-100 temp.  No chest pain.

## 2015-08-24 NOTE — Discharge Instructions (Signed)
1. Medications: robaxin, ibuprofen, usual home medications 2. Treatment: rest, drink plenty of fluids, use heat, do back exercises 3. Follow Up: please followup with your primary doctor in 2-3 days for discussion of your diagnoses and further evaluation after today's visit; please return to the ER for increased pain, numbness, weakness, new or worsening symptoms   Back Exercises If you have pain in your back, do these exercises 2-3 times each day or as told by your doctor. When the pain goes away, do the exercises once each day, but repeat the steps more times for each exercise (do more repetitions). If you do not have pain in your back, do these exercises once each day or as told by your doctor. EXERCISES Single Knee to Chest Do these steps 3-5 times in a row for each leg:  Lie on your back on a firm bed or the floor with your legs stretched out.  Bring one knee to your chest.  Hold your knee to your chest by grabbing your knee or thigh.  Pull on your knee until you feel a gentle stretch in your lower back.  Keep doing the stretch for 10-30 seconds.  Slowly let go of your leg and straighten it. Pelvic Tilt Do these steps 5-10 times in a row:  Lie on your back on a firm bed or the floor with your legs stretched out.  Bend your knees so they point up to the ceiling. Your feet should be flat on the floor.  Tighten your lower belly (abdomen) muscles to press your lower back against the floor. This will make your tailbone point up to the ceiling instead of pointing down to your feet or the floor.  Stay in this position for 5-10 seconds while you gently tighten your muscles and breathe evenly. Cat-Cow Do these steps until your lower back bends more easily:  Get on your hands and knees on a firm surface. Keep your hands under your shoulders, and keep your knees under your hips. You may put padding under your knees.  Let your head hang down, and make your tailbone point down to the  floor so your lower back is round like the back of a cat.  Stay in this position for 5 seconds.  Slowly lift your head and make your tailbone point up to the ceiling so your back hangs low (sags) like the back of a cow.  Stay in this position for 5 seconds. Press-Ups Do these steps 5-10 times in a row: 1. Lie on your belly (face-down) on the floor. 2. Place your hands near your head, about shoulder-width apart. 3. While you keep your back relaxed and keep your hips on the floor, slowly straighten your arms to raise the top half of your body and lift your shoulders. Do not use your back muscles. To make yourself more comfortable, you may change where you place your hands. 4. Stay in this position for 5 seconds. 5. Slowly return to lying flat on the floor. Bridges Do these steps 10 times in a row: 1. Lie on your back on a firm surface. 2. Bend your knees so they point up to the ceiling. Your feet should be flat on the floor. 3. Tighten your butt muscles and lift your butt off of the floor until your waist is almost as high as your knees. If you do not feel the muscles working in your butt and the back of your thighs, slide your feet 1-2 inches farther away from your  butt. 4. Stay in this position for 3-5 seconds. 5. Slowly lower your butt to the floor, and let your butt muscles relax. If this exercise is too easy, try doing it with your arms crossed over your chest. Belly Crunches Do these steps 5-10 times in a row: 1. Lie on your back on a firm bed or the floor with your legs stretched out. 2. Bend your knees so they point up to the ceiling. Your feet should be flat on the floor. 3. Cross your arms over your chest. 4. Tip your chin a little bit toward your chest but do not bend your neck. 5. Tighten your belly muscles and slowly raise your chest just enough to lift your shoulder blades a tiny bit off of the floor. 6. Slowly lower your chest and your head to the floor. Back Lifts Do  these steps 5-10 times in a row: 1. Lie on your belly (face-down) with your arms at your sides, and rest your forehead on the floor. 2. Tighten the muscles in your legs and your butt. 3. Slowly lift your chest off of the floor while you keep your hips on the floor. Keep the back of your head in line with the curve in your back. Look at the floor while you do this. 4. Stay in this position for 3-5 seconds. 5. Slowly lower your chest and your face to the floor. GET HELP IF:  Your back pain gets a lot worse when you do an exercise.  Your back pain does not lessen 2 hours after you exercise. If you have any of these problems, stop doing the exercises. Do not do them again unless your doctor says it is okay. GET HELP RIGHT AWAY IF:  You have sudden, very bad back pain. If this happens, stop doing the exercises. Do not do them again unless your doctor says it is okay.   This information is not intended to replace advice given to you by your health care provider. Make sure you discuss any questions you have with your health care provider.   Document Released: 07/22/2010 Document Revised: 03/10/2015 Document Reviewed: 08/13/2014 Elsevier Interactive Patient Education 2016 Elsevier Inc.  Back Pain, Adult Back pain is very common. The pain often gets better over time. The cause of back pain is usually not dangerous. Most people can learn to manage their back pain on their own.  HOME CARE  Watch your back pain for any changes. The following actions may help to lessen any pain you are feeling:  Stay active. Start with short walks on flat ground if you can. Try to walk farther each day.  Exercise regularly as told by your doctor. Exercise helps your back heal faster. It also helps avoid future injury by keeping your muscles strong and flexible.  Do not sit, drive, or stand in one place for more than 30 minutes.  Do not stay in bed. Resting more than 1-2 days can slow down your recovery.  Be  careful when you bend or lift an object. Use good form when lifting:  Bend at your knees.  Keep the object close to your body.  Do not twist.  Sleep on a firm mattress. Lie on your side, and bend your knees. If you lie on your back, put a pillow under your knees.  Take medicines only as told by your doctor.  Put ice on the injured area.  Put ice in a plastic bag.  Place a towel between your skin and  the bag.  Leave the ice on for 20 minutes, 2-3 times a day for the first 2-3 days. After that, you can switch between ice and heat packs.  Avoid feeling anxious or stressed. Find good ways to deal with stress, such as exercise.  Maintain a healthy weight. Extra weight puts stress on your back. GET HELP IF:   You have pain that does not go away with rest or medicine.  You have worsening pain that goes down into your legs or buttocks.  You have pain that does not get better in one week.  You have pain at night.  You lose weight.  You have a fever or chills. GET HELP RIGHT AWAY IF:   You cannot control when you poop (bowel movement) or pee (urinate).  Your arms or legs feel weak.  Your arms or legs lose feeling (numbness).  You feel sick to your stomach (nauseous) or throw up (vomit).  You have belly (abdominal) pain.  You feel like you may pass out (faint).   This information is not intended to replace advice given to you by your health care provider. Make sure you discuss any questions you have with your health care provider.   Document Released: 12/06/2007 Document Revised: 07/10/2014 Document Reviewed: 10/21/2013 Elsevier Interactive Patient Education [employer].

## 2015-08-24 NOTE — ED Provider Notes (Signed)
CSN: [id number]     Arrival date & time 08/24/15  [id number] History   First MD Initiated Contact with Patient 08/24/15 1754     Chief Complaint  Patient presents with  . Back Pain  . Fever  . Shortness of Breath    HPI   Courtney Myers is a 55 y.o. female with a PMH of fibromyalgia, RA, anxiety, depression who presents to the ED with back pain, which she states started yesterday and has been constant since that time. She reports movement and deep inspiration exacerbates her back pain. She has tried her home medications as well as OTC pain medication with no significant symptom relief. She reports subjective fever and chills at home for the past week. She denies chest pain, shortness of breath, abdominal pain, N/V/D/C, dysuria, urgency, frequency, numbness, weakness, paresthesia, bowel or bladder incontinence, saddle anesthesia, anticoagulant use, IVDU, history of malignancy.   Past Medical History  Diagnosis Date  . Fibromyalgia   . RA (rheumatoid arthritis) ([place])   . GERD (gastroesophageal reflux disease)   . Anxiety   . Depression   . History of shingles   . Synovitis of hand   . Cervical dystonia    Past Surgical History  Procedure Laterality Date  . Tonsillectomy    . Vaginal hysterectomy  3/08   Family History  Problem Relation Age of Onset  . Arthritis Mother   . Heart Problems Father   . Heart disease Father   . Heart disease Paternal Uncle     MI   Social History  Substance Use Topics  . Smoking status: Current Every Day Smoker -- 0.50 packs/day for 30 years    Types: Cigarettes    Last Attempt to Quit: 07/03/2012  . Smokeless tobacco: Never Used  . Alcohol Use: 0.0 oz/week    0 Standard drinks or equivalent per week     Comment: Rare   OB History    Gravida Para Term Preterm AB TAB SAB Ectopic Multiple Living   4 2  2 2  1   2       Review of Systems  Constitutional: Positive for fever and chills.  HENT: Negative for congestion.   Respiratory: Negative for  cough and shortness of breath.   Cardiovascular: Negative for chest pain.  Gastrointestinal: Negative for nausea, vomiting, abdominal pain, diarrhea and constipation.  Genitourinary: Negative for dysuria, urgency, frequency and flank pain.  Musculoskeletal: Positive for back pain.  Neurological: Negative for weakness and numbness.  All other systems reviewed and are negative.     Allergies  Review of patient's allergies indicates no known allergies.  Home Medications   Prior to Admission medications   Medication Sig Start Date End Date Taking? Authorizing Provider  AbobotulinumtoxinA (DYSPORT) 500 UNITS SOLR injection Inject 500 Units into the muscle See admin instructions. Take every three months per patient. Last dose was sometime in January per patient   Yes Historical Provider, MD  B-D TB SYRINGE 1CC/27GX1/2" 27G X 1/2" 1 ML MISC  07/16/14  Yes Historical Provider, MD  baclofen (LIORESAL) 10 MG tablet Take 10 mg by mouth daily as needed for muscle spasms.  05/01/13  Yes Historical Provider, MD  clonazePAM (KLONOPIN) 0.5 MG tablet Take 1 tablet (0.5 mg total) by mouth 2 (two) times daily as needed for anxiety. 01/25/15  Yes Marcial Pacas, MD  DULoxetine (CYMBALTA) 60 MG capsule Take 60 mg by mouth daily.   Yes Historical Provider, MD  folic acid (FOLVITE) 1 MG  tablet Take 1 mg by mouth 2 (two) times daily.    Yes Historical Provider, MD  inFLIXimab (REMICADE) 100 MG injection Inject 100 mg into the vein every 8 (eight) weeks. Last dose was about January 31st per patient   Yes Historical Provider, MD  METHOTREXATE SODIUM, PF, IJ Inject 0.3 mLs as directed once a week. Last dose was on 08-16-15 per patient   Yes Historical Provider, MD  Multiple Vitamins-Minerals (MULTIVITAMINS THER. W/MINERALS) TABS Take 1 tablet by mouth daily.   Yes Historical Provider, MD  Omega-3 Fatty Acids (FISH OIL PO) Take 1,500 mg by mouth.   Yes Historical Provider, MD  omeprazole (PRILOSEC) 20 MG capsule TAKE (1)  CAPSULE BY MOUTH EVERY DAY. 08/12/15  Yes Susy Frizzle, MD  traMADol (ULTRAM) 50 MG tablet Take 1 tablet (50 mg total) by mouth every 6 (six) hours as needed. For pain 04/09/15  Yes Billy Fischer, MD  ibuprofen (ADVIL,MOTRIN) 600 MG tablet Take 1 tablet (600 mg total) by mouth every 6 (six) hours as needed. 08/24/15   Marella Chimes, PA-C  methocarbamol (ROBAXIN) 500 MG tablet Take 1 tablet (500 mg total) by mouth 2 (two) times daily. 08/24/15   Guadelupe Sabin Emmagrace Runkel, PA-C    BP 130/91 mmHg  Pulse 71  Temp(Src) 98.2 F (36.8 C) (Oral)  Resp 15  SpO2 98% Physical Exam  Constitutional: She is oriented to person, place, and time. She appears well-developed and well-nourished. No distress.  HENT:  Head: Normocephalic and atraumatic.  Right Ear: External ear normal.  Left Ear: External ear normal.  Nose: Nose normal.  Mouth/Throat: Uvula is midline, oropharynx is clear and moist and mucous membranes are normal.  Eyes: Conjunctivae, EOM and lids are normal. Pupils are equal, round, and reactive to light. Right eye exhibits no discharge. Left eye exhibits no discharge. No scleral icterus.  Neck: Normal range of motion. Neck supple.  Cardiovascular: Normal rate, regular rhythm, normal heart sounds, intact distal pulses and normal pulses.   Pulmonary/Chest: Effort normal and breath sounds normal. No respiratory distress. She has no wheezes. She has no rales.  Abdominal: Soft. Normal appearance and bowel sounds are normal. She exhibits no distension and no mass. There is no tenderness. There is no rigidity, no rebound and no guarding.  Musculoskeletal: Normal range of motion. She exhibits tenderness. She exhibits no edema.  Mild TTP to thoracic and lumbar paraspinal muscles. No midline tenderness, step-off, or deformity.  Neurological: She is alert and oriented to person, place, and time. She has normal strength and normal reflexes. No sensory deficit.  Patient ambulates without difficulty.   Skin: Skin is warm, dry and intact. No rash noted. She is not diaphoretic. No erythema. No pallor.  Psychiatric: She has a normal mood and affect. Her speech is normal and behavior is normal.  Nursing note and vitals reviewed.   ED Course  Procedures (including critical care time)  Labs Review Labs Reviewed  BASIC METABOLIC PANEL - Abnormal; Notable for the following:    Glucose, Bld 101 (*)    All other components within normal limits  URINALYSIS, ROUTINE W REFLEX MICROSCOPIC (NOT AT [facility]) - Abnormal; Notable for the following:    Specific Gravity, Urine 1.003 (*)    Hgb urine dipstick SMALL (*)    All other components within normal limits  URINE MICROSCOPIC-ADD ON - Abnormal; Notable for the following:    Squamous Epithelial / LPF 0-5 (*)    Bacteria, UA RARE (*)  All other components within normal limits  CBC  I-STAT TROPOININ, ED    Imaging Review Dg Chest 2 View  08/24/2015  CLINICAL DATA:  Pain between shoulder blades for 3 days. Shortness of breath EXAM: CHEST  2 VIEW COMPARISON:  04/09/2015 FINDINGS: The heart size and mediastinal contours are within normal limits. Both lungs are clear. The visualized skeletal structures are unremarkable. IMPRESSION: No active cardiopulmonary disease. Electronically Signed   By: Rolm Baptise M.D.   On: 08/24/2015 11:58   I have personally reviewed and evaluated these images and lab results as part of my medical decision-making.   EKG Interpretation None      MDM   Final diagnoses:  Back pain, unspecified location    55 year old female presents with back pain. Denies chest pain, shortness of breath, abdominal pain, N/V/D/C, dysuria, urgency, frequency, numbness, weakness, paresthesia, bowel or bladder incontinence, saddle anesthesia, anticoagulant use, IVDU, history of malignancy.  Patient is afebrile. Vital signs stable. Heart RRR. Lungs clear to auscultation bilaterally. Abdomen soft, non-tender, non-distended. Mild TTP to  thoracic and lumbar paraspinal muscles. No midline tenderness, step-off, or deformity. Strength, sensation, DTRs intact. Patient ambulates without difficulty.   Labs ordered in triage. EKG sinus rhythm, HR 76. Troponin negative.  CBC negative for leukocytosis or anemia. BMP unremarkable.  UA negative for infection, reveals small hemoglobin, however 0-5 RBCs on microscopic.  Patient given anti-inflammatory and muscle relaxant. On reassessment, she reports symptom improvement. Patient is non-toxic and well-appearing, feel she is stable for discharge at this time. Doubt cauda equina, abscess, hematoma. Symptoms likely muscular. Will treat with anti-inflammatory and muscle relaxant. Patient to follow-up with PCP in 2-3 days. Strict return precautions discussed. Patient verbalizes her understanding and is in agreement with plan.  BP 130/91 mmHg  Pulse 71  Temp(Src) 98.2 F (36.8 C) (Oral)  Resp 15  SpO2 98%    Marella Chimes, PA-C 08/24/15 2019  Tanna Furry, MD 09/03/15 [phone]

## 2015-09-09 ENCOUNTER — Ambulatory Visit (INDEPENDENT_AMBULATORY_CARE_PROVIDER_SITE_OTHER): Payer: 59 | Admitting: Physician Assistant

## 2015-09-09 ENCOUNTER — Encounter: Payer: Self-pay | Admitting: Physician Assistant

## 2015-09-09 VITALS — BP 106/68 | HR 80 | Temp 98.0°F | Resp 18 | Ht 64.75 in | Wt 181.0 lb

## 2015-09-09 DIAGNOSIS — Z1159 Encounter for screening for other viral diseases: Secondary | ICD-10-CM | POA: Diagnosis not present

## 2015-09-09 DIAGNOSIS — Z23 Encounter for immunization: Secondary | ICD-10-CM

## 2015-09-09 DIAGNOSIS — Z0000 Encounter for general adult medical examination without abnormal findings: Secondary | ICD-10-CM | POA: Diagnosis not present

## 2015-09-09 LAB — COMPLETE METABOLIC PANEL WITH GFR
ALT: 17 U/L (ref 6–29)
AST: 20 U/L (ref 10–35)
Albumin: 3.9 g/dL (ref 3.6–5.1)
Alkaline Phosphatase: 70 U/L (ref 33–130)
BUN: 10 mg/dL (ref 7–25)
CO2: 26 mmol/L (ref 20–31)
Calcium: 9.2 mg/dL (ref 8.6–10.4)
Chloride: 105 mmol/L (ref 98–110)
Creat: 0.74 mg/dL (ref 0.50–1.05)
GFR, Est African American: >89 mL/min (ref >=60)
GFR, Est Non African American: >89 mL/min (ref >=60)
Glucose, Bld: 92 mg/dL (ref 70–99)
Potassium: 4.2 mmol/L (ref 3.5–5.3)
Sodium: 139 mmol/L (ref 135–146)
Total Bilirubin: 0.3 mg/dL (ref 0.2–1.2)
Total Protein: 6.7 g/dL (ref 6.1–8.1)

## 2015-09-09 LAB — CBC WITH DIFFERENTIAL/PLATELET
Basophils Absolute: 0.1 10*3/uL (ref 0.0–0.1)
Basophils Relative: 1 % (ref 0–1)
Eosinophils Absolute: 0.2 10*3/uL (ref 0.0–0.7)
Eosinophils Relative: 2 % (ref 0–5)
HCT: 40.9 % (ref 36.0–46.0)
Hemoglobin: 13.6 g/dL (ref 12.0–15.0)
Lymphocytes Relative: 33 % (ref 12–46)
Lymphs Abs: 2.8 10*3/uL (ref 0.7–4.0)
MCH: 30.4 pg (ref 26.0–34.0)
MCHC: 33.3 g/dL (ref 30.0–36.0)
MCV: 91.3 fL (ref 78.0–100.0)
MPV: 11.3 fL (ref 8.6–12.4)
Monocytes Absolute: 0.7 10*3/uL (ref 0.1–1.0)
Monocytes Relative: 8 % (ref 3–12)
Neutro Abs: 4.8 10*3/uL (ref 1.7–7.7)
Neutrophils Relative %: 56 % (ref 43–77)
Platelets: 230 10*3/uL (ref 150–400)
RBC: 4.48 MIL/uL (ref 3.87–5.11)
RDW: 13.6 % (ref 11.5–15.5)
WBC: 8.6 10*3/uL (ref 4.0–10.5)

## 2015-09-09 LAB — LIPID PANEL
Cholesterol: 202 mg/dL — ABNORMAL HIGH (ref 125–200)
HDL: 49 mg/dL (ref >=46)
LDL Cholesterol: 139 mg/dL — ABNORMAL HIGH (ref <130)
Total CHOL/HDL Ratio: 4.1 Ratio (ref <=5.0)
Triglycerides: 69 mg/dL (ref <150)
VLDL: 14 mg/dL (ref <30)

## 2015-09-09 LAB — TSH: TSH: 1.13 mIU/L

## 2015-09-09 NOTE — Progress Notes (Signed)
Patient ID: Courtney Myers MRN: [id number], DOB: 07/20/1960, 55 y.o. Date of Encounter: 09/09/2015,   Chief Complaint: Physical (CPE)  HPI: 55 y.o. y/o female  here for CPE.   No specific complaints or concerns. Does ask about doing the test for hepatitis C as she has seen this advertised/recommended.   She has multiple specialists that she sees on a routine basis.: Rheumatology ---Dr. Estanislado Pandy Neurology-------- she still gets injections for her neck Gynecology----- she has her pelvic exams pap smears breast exams and mammograms with GYN She states that she saw a different GI doctor several years ago because she had issues with her liver--says that was a different GI doctor than she saw for her colonoscopy. Dermatology---- Dr. Syble Creek   Review of Systems: Consitutional: No fever, chills, fatigue, night sweats, lymphadenopathy. No significant/unexplained weight changes. Eyes: No visual changes, eye redness, or discharge. ENT/Mouth: No ear pain, sore throat, nasal drainage, or sinus pain. Cardiovascular: No chest pressure,heaviness, tightness or squeezing, even with exertion. No increased shortness of breath or dyspnea on exertion.No palpitations, edema, orthopnea, PND. Respiratory: No cough, hemoptysis, SOB, or wheezing. Gastrointestinal: No anorexia, dysphagia, reflux, pain, nausea, vomiting, hematemesis, diarrhea, constipation, BRBPR, or melena. Breast: No mass, nodules, bulging, or retraction. No skin changes or inflammation. No nipple discharge. No lymphadenopathy. Genitourinary: No dysuria, hematuria, incontinence, vaginal discharge, pruritis, burning, abnormal bleeding, or pain. Musculoskeletal: She has RA. 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  Skin: No rash, pruritis, or concerning lesions. Neurological: No headache, dizziness, syncope, seizures, tremors, memory loss, coordination problems, or paresthesias. Psychological: No hallucinations, SI/HI. Endocrine: No polydipsia,  polyphagia, polyuria, or known diabetes.No increased fatigue. No palpitations/rapid heart rate. No significant/unexplained weight change. All other systems were reviewed and are otherwise negative.  Past Medical History  Diagnosis Date  . Fibromyalgia   . RA (rheumatoid arthritis) ([place])   . GERD (gastroesophageal reflux disease)   . Anxiety   . Depression   . History of shingles   . Synovitis of hand   . Cervical dystonia      Past Surgical History  Procedure Laterality Date  . Tonsillectomy    . Vaginal hysterectomy  3/08    Home Meds:  Outpatient Prescriptions Prior to Visit  Medication Sig Dispense Refill  . AbobotulinumtoxinA (DYSPORT) 500 UNITS SOLR injection Inject 500 Units into the muscle See admin instructions. Take every three months per patient. Last dose was sometime in January per patient    . B-D TB SYRINGE 1CC/27GX1/2" 27G X 1/2" 1 ML MISC   1  . baclofen (LIORESAL) 10 MG tablet Take 10 mg by mouth daily as needed for muscle spasms.     . clonazePAM (KLONOPIN) 0.5 MG tablet Take 1 tablet (0.5 mg total) by mouth 2 (two) times daily as needed for anxiety. 60 tablet 5  . DULoxetine (CYMBALTA) 60 MG capsule Take 60 mg by mouth daily.    . folic acid (FOLVITE) 1 MG tablet Take 1 mg by mouth 2 (two) times daily.     Marland Kitchen ibuprofen (ADVIL,MOTRIN) 600 MG tablet Take 1 tablet (600 mg total) by mouth every 6 (six) hours as needed. 30 tablet 0  . inFLIXimab (REMICADE) 100 MG injection Inject 100 mg into the vein every 8 (eight) weeks. Last dose was about January 31st per patient    . Multiple Vitamins-Minerals (MULTIVITAMINS THER. W/MINERALS) TABS Take 1 tablet by mouth daily.    . Omega-3 Fatty Acids (FISH OIL PO) Take 1,500 mg by mouth.    Marland Kitchen  omeprazole (PRILOSEC) 20 MG capsule TAKE (1) CAPSULE BY MOUTH EVERY DAY. 30 capsule 11  . METHOTREXATE SODIUM, PF, IJ Inject 0.3 mLs as directed once a week. Last dose was on 08-16-15 per patient    . methocarbamol (ROBAXIN) 500 MG tablet  Take 1 tablet (500 mg total) by mouth 2 (two) times daily. (Patient not taking: Reported on 09/09/2015) 20 tablet 0  . traMADol (ULTRAM) 50 MG tablet Take 1 tablet (50 mg total) by mouth every 6 (six) hours as needed. For pain (Patient not taking: Reported on 09/09/2015) 20 tablet 0   No facility-administered medications prior to visit.    Allergies: No Known Allergies  Social History   Social History  . Marital Status: Married    Spouse Name: [name]  . Number of Children: 2  . Years of Education: 12   Occupational History  .  [place] History Main Topics  . Smoking status: Current Every Day Smoker -- 0.50 packs/day for 30 years    Types: Cigarettes    Last Attempt to Quit: 07/03/2012  . Smokeless tobacco: Never Used  . Alcohol Use: 0.0 oz/week    0 Standard drinks or equivalent per week     Comment: Rare  . Drug Use: No  . Sexual Activity: Yes    Birth Control/ Protection: Surgical     Comment: 1st intercourse 55 yo-Fewer than 5 partners   Other Topics Concern  . 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 Right handed.    Caffeine- 4 cups daily.    Family History  Problem Relation Age of Onset  . Arthritis Mother   . Heart Problems Father   . Heart disease Father   . Heart disease Paternal Uncle     MI    Physical Exam: Blood pressure 106/68, pulse 80, temperature 98 F (36.7 C), temperature source Oral, resp. rate 18, height 5' 4.75" (1.645 m), weight 181 lb (82.101 kg)., Body mass index is 30.34 kg/(m^2). General: Well developed, well nourished, WF. Appears in no acute distress. HEENT: Normocephalic, atraumatic. Conjunctiva pink, sclera non-icteric. Pupils 2 mm constricting to 1 mm, round, regular, and equally reactive to light and accomodation. EOMI. Internal auditory canal clear. TMs with good cone of light and without pathology.  Nasal mucosa pink. Nares are without discharge. No sinus tenderness. Oral mucosa pink.  Pharynx without exudate.   Neck: Supple. Trachea midline. No thyromegaly. No lymphadenopathy.No Carotid Bruits.She does have tremor/muscle jerks of neck.  Lungs: Clear to auscultation bilaterally without wheezes, rales, or rhonchi. Breathing is of normal effort and unlabored. Cardiovascular: RRR with S1 S2. No murmurs, rubs, or gallops. Distal pulses 2+ symmetrically. No carotid or abdominal bruits. Breast: Per Gyn Abdomen: Soft, non-tender, non-distended with normoactive bowel sounds. No hepatosplenomegaly or masses. No rebound/guarding. No CVA tenderness. No hernias.  Genitourinary: Per Gyn Musculoskeletal: She has RA, managed by Rheumatology.  Skin: Warm and moist without erythema, ecchymosis, wounds, or rash. Neuro: A+Ox3. CN II-XII grossly intact. Moves all extremities spontaneously. Full sensation throughout. Normal gait. DTR 2+ throughout upper and lower extremities. Psych:  Responds to questions appropriately with a normal affect.   Assessment/Plan:  55 y.o. y/o female here for CPE 1. Visit for preventive health examination A. Screening Labs: She is fasting and agreeable to check screening labs. - CBC with Differential/Platelet - COMPLETE METABOLIC PANEL WITH GFR -  Lipid panel - TSH - VITAMIN D 25 Hydroxy (Vit-D Deficiency, Fractures)    B. Pap: Per Gyn  C. Screening Mammogram: Per Gyn  D. DEXA/BMD:  Per Gyn. Ow/ wait until age 50  E. Colorectal Cancer Screening: She had screening colonoscopy by Dr. Verl Blalock 10/25/2009. Normal. Repeat 10 years.  F. Immunizations:  Influenza: Received influenza vaccine 05/01/2015 Tetanus: She has had no tetanus vaccine in > 10 years.  She is agreeable to update today. Pneumococcal: She received Prevnar 13-----05/01/2015. Need to wait 6-12 months after that to give Pneumovax 23. Return for Pneumovax 23 10/30/15 - 04/30/16 Zostavax: Will discuss  at age 78     2. Need for hepatitis C screening test She is aware of recommendations to check hepatitis C and wants to check this with labs today. - Hepatitis C Antibody   Marin Olp [place], [place], [facility] 09/09/2015 8:27 AM

## 2015-09-10 LAB — VITAMIN D 25 HYDROXY (VIT D DEFICIENCY, FRACTURES): Vit D, 25-Hydroxy: 44 ng/mL (ref 30–100)

## 2015-09-10 LAB — HEPATITIS C ANTIBODY: HCV Ab: NEGATIVE

## 2015-09-13 ENCOUNTER — Encounter: Payer: Self-pay | Admitting: Family Medicine

## 2015-09-13 ENCOUNTER — Other Ambulatory Visit (HOSPITAL_COMMUNITY): Payer: Self-pay | Admitting: *Deleted

## 2015-09-14 ENCOUNTER — Ambulatory Visit (HOSPITAL_COMMUNITY)
Admission: RE | Admit: 2015-09-14 | Discharge: 2015-09-14 | Disposition: A | Discharge status: Home / Self Care | Payer: 59 | Source: Ambulatory Visit | Attending: Rheumatology | Admitting: Rheumatology

## 2015-09-14 DIAGNOSIS — M069 Rheumatoid arthritis, unspecified: Secondary | ICD-10-CM | POA: Insufficient documentation

## 2015-09-14 LAB — COMPREHENSIVE METABOLIC PANEL
ALT: 19 U/L (ref 14–54)
AST: 22 U/L (ref 15–41)
Albumin: 3.9 g/dL (ref 3.5–5.0)
Alkaline Phosphatase: 74 U/L (ref 38–126)
Anion gap: 10 (ref 5–15)
BUN: 8 mg/dL (ref 6–20)
CO2: 22 mmol/L (ref 22–32)
Calcium: 9.4 mg/dL (ref 8.9–10.3)
Chloride: 106 mmol/L (ref 101–111)
Creatinine, Ser: 0.72 mg/dL (ref 0.44–1.00)
GFR calc Af Amer: >60 mL/min (ref >60)
GFR calc non Af Amer: >60 mL/min (ref >60)
Glucose, Bld: 106 mg/dL — ABNORMAL HIGH (ref 65–99)
Potassium: 4.1 mmol/L (ref 3.5–5.1)
Sodium: 138 mmol/L (ref 135–145)
Total Bilirubin: 0.5 mg/dL (ref 0.3–1.2)
Total Protein: 7.4 g/dL (ref 6.5–8.1)

## 2015-09-14 LAB — CBC WITH DIFFERENTIAL/PLATELET
Basophils Absolute: 0.1 10*3/uL (ref 0.0–0.1)
Basophils Relative: 1 %
Eosinophils Absolute: 0.2 10*3/uL (ref 0.0–0.7)
Eosinophils Relative: 2 %
HCT: 42.5 % (ref 36.0–46.0)
Hemoglobin: 13.9 g/dL (ref 12.0–15.0)
Lymphocytes Relative: 36 %
Lymphs Abs: 3.1 10*3/uL (ref 0.7–4.0)
MCH: 29.9 pg (ref 26.0–34.0)
MCHC: 32.7 g/dL (ref 30.0–36.0)
MCV: 91.4 fL (ref 78.0–100.0)
Monocytes Absolute: 0.6 10*3/uL (ref 0.1–1.0)
Monocytes Relative: 7 %
Neutro Abs: 4.7 10*3/uL (ref 1.7–7.7)
Neutrophils Relative %: 54 %
Platelets: 238 10*3/uL (ref 150–400)
RBC: 4.65 MIL/uL (ref 3.87–5.11)
RDW: 13.6 % (ref 11.5–15.5)
WBC: 8.6 10*3/uL (ref 4.0–10.5)

## 2015-09-14 MED ORDER — DIPHENHYDRAMINE HCL 25 MG PO CAPS: 25.0000 mg | ORAL_CAPSULE | ORAL | Status: DC

## 2015-09-14 MED ORDER — ACETAMINOPHEN 325 MG PO TABS: 650.0000 mg | ORAL_TABLET | ORAL | Status: DC

## 2015-09-14 MED ORDER — SODIUM CHLORIDE 0.9 % IV SOLN
INTRAVENOUS | Status: AC
  Administered 2015-09-14: 10:00:00 via INTRAVENOUS

## 2015-09-14 MED ORDER — SODIUM CHLORIDE 0.9 % IV SOLN
500.0000 mg | INTRAVENOUS | Status: AC
  Administered 2015-09-14: 500 mg via INTRAVENOUS
  Filled 2015-09-14: qty 50

## 2015-09-16 ENCOUNTER — Telehealth: Payer: Self-pay | Admitting: Neurology

## 2015-09-16 LAB — QUANTIFERON IN TUBE
QFT TB AG MINUS NIL VALUE: 0 IU/mL
QUANTIFERON MITOGEN VALUE: >10 IU/mL
QUANTIFERON TB AG VALUE: 0.05 IU/mL
QUANTIFERON TB GOLD: NEGATIVE
Quantiferon Nil Value: 0.05 IU/mL

## 2015-09-16 LAB — QUANTIFERON TB GOLD ASSAY (BLOOD)

## 2015-09-16 MED ORDER — CLONAZEPAM 0.5 MG PO TABS: 0.5000 mg | ORAL_TABLET | Freq: Two times a day (BID) | ORAL | Status: DC | PRN

## 2015-09-16 NOTE — Telephone Encounter (Signed)
Rx printed, signed and faxed to pharmacy at [phone].

## 2015-09-16 NOTE — Telephone Encounter (Signed)
Pt called said [facility] has sent refill request for clonazePAM (KLONOPIN) 0.5 MG tabl last week and on Monday.

## 2015-10-21 ENCOUNTER — Ambulatory Visit (INDEPENDENT_AMBULATORY_CARE_PROVIDER_SITE_OTHER): Payer: 59 | Admitting: Neurology

## 2015-10-21 ENCOUNTER — Encounter: Payer: Self-pay | Admitting: Neurology

## 2015-10-21 VITALS — BP 101/64 | HR 82 | Resp 16 | Ht 65.0 in | Wt 182.0 lb

## 2015-10-21 DIAGNOSIS — G243 Spasmodic torticollis: Secondary | ICD-10-CM

## 2015-10-21 NOTE — Progress Notes (Signed)
PATIENT: Courtney Myers DOB: 1961/03/24  HISTORICAL  AMARIS DECHRISTOPHER  is a 55 year-old right-handed Caucasian female, came in for EMG guided Botox for cervical dystonia.  Symptom onset was without apparent triggering event, she has gradually developed this neck pulling,  manifested primarily with right laterocollis, mild retrocolis, and left shoulder elevation since 2000. She has good relief with regular Botox injection, about 4 times a year since 2003. Last injection was February 18, 2010 by Dr. Marta Antu,  She reported great relief as usual, taking 4- 5 days to get symptom relieve, lasting about 3 months.  I began to inject her since 12.2011, every 3-4 months, dosage has been limited to 150 units of BOTOX A.  She denies neck pain, gait difficulty, but with wearing off Botox, she noticed increased head tremor, and pulling towards her right shoulder.  She developed  transient difficulty lifting her neck up when using BOTOX A 200 units previously, reponding well to decreased dose of 150 units  She was enrolled into IPSEN dysport study, first injection was in April 30th 2013, she later enrolled into open label, 2nd injection was in  May 28th 2013. She did very well.  Recent few weeks,  She had experienced flareup of her rheumatoid arthritis, has received IV infusion. Last injection was in 06/10/2012,  500 units of dysport was dissolved into 2 cc of normal saline. Right longissimus capitus 0.5 cc Right splenius capitis 0.5 cc Right levator scapular 0.5 cc Right iliocostalis 0.5 cc   Dysport research study has completed, she is now coming back for repeat injection, she noticed head bobbing over the past few weeks, she denies significant neck pain, weakness,  Her rheumatoid arthritis is under better control   Last injection was in Sep 2014, she did very well, no neck extension weakness, only rarely neck shaking  UPDATE March 8th 2016: She has lost follow-up since her last visit December 2014,  She came in for treatment her cervical dystonia, she feel the tension of her neck all the time, stiffness. Denied gait difficulty, last injection was September 2014, she received 500 units of Dysport, works well for her, she is continue on medication for anxiety, rheumatoid arthritis She also complains of nighttime snoring, frequent awakening, excessive daytime sleepiness, fatigue, today's ESS is 6, FSS was 1,  UPDATE October 13 2014:   She has missed her scheduled sleep study, continue have worsening neck posturing, posterior neck pain, excessive daytime fatigue and sleepiness  UPDATE Nov 11 2014: She came in for EMG guided Dysport injection today, last injection was December 2014, she complains of worsening head titubation, bilateral hands mild postural tremor, posterior neck pain,  Update February 17 2015 She responded very well to last EMG guided Dysport injection Nov 11 2014, no significant head titubation, no significant neck pain  Update July 22 2015: Last injection was August, now she noticed returning of neck pulling and shaking, posterior neck muscle achy pain  Update October 21 2015: Last EMG guided Dysport injection was in January, she responded very well, only recent couple weeks, she noticed returning of neck pulling, shaking again, She has worsening rheumatoid arthritis, is receiving more frequent treatment, complains fatigue, diffuse body achy pain  REVIEW OF SYSTEMS: Full 14 system review of systems performed and notable only for as above   ALLERGIES: No Known Allergies  HOME MEDICATIONS: Current Outpatient Prescriptions  Medication Sig Dispense Refill  . AbobotulinumtoxinA (DYSPORT) 500 UNITS SOLR injection Inject 500 Units into the  muscle See admin instructions. Take every three months per patient. Last dose was sometime in January per patient    . B-D TB SYRINGE 1CC/27GX1/2" 27G X 1/2" 1 ML MISC   1  . baclofen (LIORESAL) 10 MG tablet Take 10 mg by mouth daily as  needed for muscle spasms.     . clonazePAM (KLONOPIN) 0.5 MG tablet Take 1 tablet (0.5 mg total) by mouth 2 (two) times daily as needed for anxiety. 60 tablet 5  . DULoxetine (CYMBALTA) 60 MG capsule Take 60 mg by mouth daily.    . folic acid (FOLVITE) 1 MG tablet Take 1 mg by mouth 2 (two) times daily.     Marland Kitchen ibuprofen (ADVIL,MOTRIN) 600 MG tablet Take 1 tablet (600 mg total) by mouth every 6 (six) hours as needed. 30 tablet 0  . inFLIXimab (REMICADE) 100 MG injection Inject 100 mg into the vein every 8 (eight) weeks. Last dose was about January 31st per patient    . methocarbamol (ROBAXIN) 500 MG tablet Take 1 tablet (500 mg total) by mouth 2 (two) times daily. 20 tablet 0  . methotrexate 50 MG/2ML injection   0  . Multiple Vitamins-Minerals (MULTIVITAMINS THER. W/MINERALS) TABS Take 1 tablet by mouth daily.    . Omega-3 Fatty Acids (FISH OIL PO) Take 1,500 mg by mouth.    Marland Kitchen omeprazole (PRILOSEC) 20 MG capsule TAKE (1) CAPSULE BY MOUTH EVERY DAY. 30 capsule 11  . traMADol (ULTRAM) 50 MG tablet Take 1 tablet (50 mg total) by mouth every 6 (six) hours as needed. For pain 20 tablet 0   No current facility-administered medications for this visit.    PAST MEDICAL HISTORY: Past Medical History  Diagnosis Date  . Fibromyalgia   . RA (rheumatoid arthritis) ([place])   . GERD (gastroesophageal reflux disease)   . Anxiety   . Depression   . History of shingles   . Synovitis of hand   . Cervical dystonia     PAST SURGICAL HISTORY: Past Surgical History  Procedure Laterality Date  . Tonsillectomy    . Vaginal hysterectomy  3/08    FAMILY HISTORY: Family History  Problem Relation Age of Onset  . Arthritis Mother   . Heart Problems Father   . Heart disease Father   . Heart disease Paternal Uncle     MI    SOCIAL HISTORY:  Social History   Social History  . Marital Status: Married    Spouse Name: [name]  . Number of Children: 2  . Years of Education: 12   Occupational History  .   [place] History Main Topics  . Smoking status: Current Every Day Smoker -- 0.50 packs/day for 30 years    Types: Cigarettes    Last Attempt to Quit: 07/03/2012  . Smokeless tobacco: Never Used  . Alcohol Use: 0.0 oz/week    0 Standard drinks or equivalent per week     Comment: Rare  . Drug Use: No  . Sexual Activity: Yes    Birth Control/ Protection: Surgical     Comment: 1st intercourse 55 yo-Fewer than 5 partners   Other Topics Concern  . 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 Right handed.    Caffeine- 4 cups daily.     PHYSICAL EXAM   Filed Vitals:   10/21/15 0729  BP: 101/64  Pulse: 82  Resp: 16  Height: 5\' 5"  (1.651 m)  Weight: 182 lb (82.555 kg)    Not recorded      Body mass index is 30.29 kg/(m^2).  PHYSICAL EXAMNIATION: MENTAL STATUS: Speech is normal, normal to casual conversation, and history taking  CRANIAL NERVES: mild retrocollis, right tilt, mild right lateral shift, slight left shoulder elevation  MOTOR: Normal tone and bulk and strength COORDINATION: No dysmetria   GAIT/STANCE: Posture is normal.  DIAGNOSTIC DATA (LABS, IMAGING, TESTING) - I reviewed patient records, labs, notes, testing and imaging myself where available.  ASSESSMENT AND PLAN  DARRICKA MIKUS is a 55 y.o. female with long-standing history of cervical dystonia, responded very well to previous EMG guided Dysport injection, last injection was May 2016, responded very well.   Mild retrocollis, right tilt, mild right lateral shift, slight left shoulder elevation  500 of Dysport was dissolved into 2 cc of normal saline,  Right longissimus capitus 0. 25cc Right semispinalis 0.2 5 cc, Right inferior oblique capitis, 0.25 cc Left inferior oblique capitis 0.25 cc  Right splenius capitis 0.25 ccx2=0.5 cc Right splenius cervix 0.25  ccx2=0.5 cc  Patient tolerate the injection well, will return to clinic in 3 months for repeat injection   Marcial Pacas, M.D. Ph.D.  [facility] [address], [place],  [address] Ph: [phone] Fax: [phone]

## 2015-10-25 ENCOUNTER — Other Ambulatory Visit (HOSPITAL_COMMUNITY): Payer: Self-pay

## 2015-10-26 ENCOUNTER — Encounter (HOSPITAL_COMMUNITY): Admission: RE | Admit: 2015-10-26 | Payer: 59 | Source: Ambulatory Visit

## 2015-11-01 ENCOUNTER — Other Ambulatory Visit (HOSPITAL_COMMUNITY): Payer: Self-pay | Admitting: *Deleted

## 2015-11-02 ENCOUNTER — Ambulatory Visit (HOSPITAL_COMMUNITY)
Admission: RE | Admit: 2015-11-02 | Discharge: 2015-11-02 | Disposition: A | Discharge status: Home / Self Care | Payer: 59 | Source: Ambulatory Visit | Attending: Rheumatology | Admitting: Rheumatology

## 2015-11-19 ENCOUNTER — Ambulatory Visit (HOSPITAL_COMMUNITY)
Admission: RE | Admit: 2015-11-19 | Discharge: 2015-11-19 | Disposition: A | Discharge status: Home / Self Care | Payer: 59 | Source: Ambulatory Visit | Attending: Rheumatology | Admitting: Rheumatology

## 2015-11-19 DIAGNOSIS — M069 Rheumatoid arthritis, unspecified: Secondary | ICD-10-CM | POA: Diagnosis not present

## 2015-11-19 LAB — COMPREHENSIVE METABOLIC PANEL
ALT: 17 U/L (ref 14–54)
AST: 21 U/L (ref 15–41)
Albumin: 3.6 g/dL (ref 3.5–5.0)
Alkaline Phosphatase: 68 U/L (ref 38–126)
Anion gap: 10 (ref 5–15)
BUN: 8 mg/dL (ref 6–20)
CO2: 26 mmol/L (ref 22–32)
Calcium: 9 mg/dL (ref 8.9–10.3)
Chloride: 102 mmol/L (ref 101–111)
Creatinine, Ser: 0.79 mg/dL (ref 0.44–1.00)
GFR calc Af Amer: >60 mL/min (ref >60)
GFR calc non Af Amer: >60 mL/min (ref >60)
Glucose, Bld: 93 mg/dL (ref 65–99)
Potassium: 3.4 mmol/L — ABNORMAL LOW (ref 3.5–5.1)
Sodium: 138 mmol/L (ref 135–145)
Total Bilirubin: 0.5 mg/dL (ref 0.3–1.2)
Total Protein: 6.9 g/dL (ref 6.5–8.1)

## 2015-11-19 LAB — CBC WITH DIFFERENTIAL/PLATELET
Basophils Absolute: 0.1 10*3/uL (ref 0.0–0.1)
Basophils Relative: 1 %
Eosinophils Absolute: 0.2 10*3/uL (ref 0.0–0.7)
Eosinophils Relative: 2 %
HCT: 38.6 % (ref 36.0–46.0)
Hemoglobin: 12.6 g/dL (ref 12.0–15.0)
Lymphocytes Relative: 29 %
Lymphs Abs: 2.6 10*3/uL (ref 0.7–4.0)
MCH: 29.4 pg (ref 26.0–34.0)
MCHC: 32.6 g/dL (ref 30.0–36.0)
MCV: 90.2 fL (ref 78.0–100.0)
Monocytes Absolute: 0.6 10*3/uL (ref 0.1–1.0)
Monocytes Relative: 7 %
Neutro Abs: 5.5 10*3/uL (ref 1.7–7.7)
Neutrophils Relative %: 61 %
Platelets: 205 10*3/uL (ref 150–400)
RBC: 4.28 MIL/uL (ref 3.87–5.11)
RDW: 13.6 % (ref 11.5–15.5)
WBC: 9 10*3/uL (ref 4.0–10.5)

## 2015-11-19 MED ORDER — ACETAMINOPHEN 325 MG PO TABS: 650.0000 mg | ORAL_TABLET | Freq: Once | ORAL | Status: DC

## 2015-11-19 MED ORDER — DIPHENHYDRAMINE HCL 25 MG PO CAPS: 25.0000 mg | ORAL_CAPSULE | Freq: Once | ORAL | Status: DC

## 2015-11-19 MED ORDER — SODIUM CHLORIDE 0.9 % IV SOLN
500.0000 mg | INTRAVENOUS | Status: AC
  Administered 2015-11-19: 500 mg via INTRAVENOUS
  Filled 2015-11-19 (×2): qty 50

## 2015-11-19 MED ORDER — SODIUM CHLORIDE 0.9 % IV SOLN
INTRAVENOUS | Status: AC
  Administered 2015-11-19: 08:00:00 via INTRAVENOUS

## 2015-12-14 ENCOUNTER — Ambulatory Visit (INDEPENDENT_AMBULATORY_CARE_PROVIDER_SITE_OTHER): Payer: 59 | Admitting: Family Medicine

## 2015-12-14 ENCOUNTER — Encounter: Payer: Self-pay | Admitting: Family Medicine

## 2015-12-14 VITALS — BP 128/70 | HR 82 | Temp 100.6°F | Resp 16 | Ht 65.0 in | Wt 176.0 lb

## 2015-12-14 DIAGNOSIS — R509 Fever, unspecified: Secondary | ICD-10-CM

## 2015-12-14 LAB — URINALYSIS, ROUTINE W REFLEX MICROSCOPIC
Bilirubin Urine: NEGATIVE
Glucose, UA: NEGATIVE
Leukocytes, UA: NEGATIVE
Nitrite: NEGATIVE
Protein, ur: NEGATIVE
Specific Gravity, Urine: 1.005 (ref 1.001–1.035)
pH: 5.5 (ref 5.0–8.0)

## 2015-12-14 LAB — CBC
HCT: 38 % (ref 35.0–45.0)
Hemoglobin: 13 g/dL (ref 12.0–15.0)
MCH: 31.2 pg (ref 27.0–33.0)
MCHC: 34.2 g/dL (ref 32.0–36.0)
MCV: 91.1 fL (ref 80.0–100.0)
MPV: 12.1 fL (ref 7.5–12.5)
Platelets: 160 10*3/uL (ref 140–400)
RBC: 4.17 MIL/uL (ref 3.80–5.10)
RDW: 14.4 % (ref 11.0–15.0)
WBC: 10.7 10*3/uL (ref 3.8–10.8)

## 2015-12-14 LAB — URINALYSIS, MICROSCOPIC ONLY
Bacteria, UA: NONE SEEN [HPF]
Casts: NONE SEEN [LPF]
Crystals: NONE SEEN [HPF]
Yeast: NONE SEEN [HPF]

## 2015-12-14 MED ORDER — AZITHROMYCIN 250 MG PO TABS: ORAL_TABLET | ORAL | Status: DC

## 2015-12-14 NOTE — Progress Notes (Signed)
Patient ID: Courtney Myers, female   DOB: [birth year], 55 y.o.   MRN: [id number]    Subjective:    Patient ID: Courtney Myers, female    DOB: 05/20/1961, 55 y.o.   MRN: [id number]  Patient presents for Illness  Pt here with fever, fatigue, body aches for the past 3 days. Fever Tmax 102.37F yesterday. Hot and cold chills. No significant cough, no UTI symptoms, no sinus pressure or drainage, mild headache. No sore throat, had diarrhea Sunday a few episodes.    No tick bite or insect exposure, no sick contacts  She is on immunosuppression secondary to her rheumatoid arthritis she also has fibromyalgia and cervical dystonia   Review Of Systems:   GEN- + fatigue,+ fever, weight loss,weakness, recent illness HEENT- denies eye drainage, change in vision, nasal discharge, CVS- denies chest pain, palpitations RESP- denies SOB, cough, wheeze ABD- denies N/V, change in stools, abd pain GU- denies dysuria, hematuria, dribbling, incontinence MSK- denies joint pain,+ muscle aches, injury Neuro- denies headache, dizziness, syncope, seizure activity       Objective:    BP 128/70 mmHg  Pulse 82  Temp(Src) 100.6 F (38.1 C) (Oral)  Resp 16  Ht 5\' 5"  (1.651 m)  Wt 176 lb (79.833 kg)  BMI 29.29 kg/m2 GEN- NAD, alert and oriented x3 HEENT- PERRL, EOMI, non injected sclera, pink conjunctiva, MMM, oropharynx clear, nares clear rhinorrhea, TM clear no effusion  Neck- Supple, no LAD  CVS- RRR, no murmur RESP-CTAB ABD-NABS,soft,NT,ND, no CVA tenderness Skin- scabs on face, bilat arms, no cellulitis appearing lesions  EXT- No edema Pulses- Radial 2+        Assessment & Plan:      Problem List Items Addressed This Visit    None    Visit Diagnoses    Fever, unspecified    -  Primary    unknown cause, viral etiology possibility, also could be getting URI without all of symptoms present, WBC a little elevated from her baseline especially since immunocompromised. I expect other symptoms may  follow. Push fluids, with blood in urine, > UTI coming, ketones more from poor intake. Start zpakm, nasal saline rinses, fever reducer No sign of meningitis, no tick bites, no abnormal rash, no new drugs      Relevant Orders    Comprehensive metabolic panel (Completed)    Urinalysis, Routine w reflex microscopic (not at [facility]) (Completed)    CBC (Completed)    Urine culture       Note: This dictation was prepared with Dragon dictation along with smaller phrase technology. Any transcriptional errors that result from this process are unintentional.

## 2015-12-14 NOTE — Patient Instructions (Signed)
Take antibiotics Continue current medications  Use fever reducer We will call with results of other labs tomorrow F/U pending results

## 2015-12-15 LAB — COMPREHENSIVE METABOLIC PANEL
ALT: 25 U/L (ref 6–29)
AST: 27 U/L (ref 10–35)
Albumin: 3.8 g/dL (ref 3.6–5.1)
Alkaline Phosphatase: 84 U/L (ref 33–130)
BUN: 9 mg/dL (ref 7–25)
CO2: 24 mmol/L (ref 20–31)
Calcium: 8.7 mg/dL (ref 8.6–10.4)
Chloride: 96 mmol/L — ABNORMAL LOW (ref 98–110)
Creat: 0.83 mg/dL (ref 0.50–1.05)
Glucose, Bld: 90 mg/dL (ref 70–99)
Potassium: 3.8 mmol/L (ref 3.5–5.3)
Sodium: 133 mmol/L — ABNORMAL LOW (ref 135–146)
Total Bilirubin: 0.6 mg/dL (ref 0.2–1.2)
Total Protein: 6.8 g/dL (ref 6.1–8.1)

## 2015-12-16 LAB — URINE CULTURE: Colony Count: 30000

## 2015-12-30 ENCOUNTER — Other Ambulatory Visit (HOSPITAL_COMMUNITY): Payer: Self-pay | Admitting: *Deleted

## 2015-12-30 NOTE — Progress Notes (Signed)
Spoke with Amy at Dr. Arlean Hopping office regarding lab work. Reported to Amy that Courtney Myers was done 09/14/15 and result was negative. Reported to Amy that a CBC was done in office on 12/14/15 but no diff was done. Will repeat cbc with diff at tomorrow's visit per Amy.

## 2015-12-31 ENCOUNTER — Ambulatory Visit (HOSPITAL_COMMUNITY)
Admission: RE | Admit: 2015-12-31 | Discharge: 2015-12-31 | Disposition: A | Discharge status: Home / Self Care | Payer: 59 | Source: Ambulatory Visit | Attending: Rheumatology | Admitting: Rheumatology

## 2015-12-31 DIAGNOSIS — M069 Rheumatoid arthritis, unspecified: Secondary | ICD-10-CM | POA: Insufficient documentation

## 2015-12-31 LAB — CBC WITH DIFFERENTIAL/PLATELET
Basophils Absolute: 0.1 10*3/uL (ref 0.0–0.1)
Basophils Relative: 1 %
Eosinophils Absolute: 0.1 10*3/uL (ref 0.0–0.7)
Eosinophils Relative: 2 %
HCT: 40.6 % (ref 36.0–46.0)
Hemoglobin: 13.3 g/dL (ref 12.0–15.0)
Lymphocytes Relative: 39 %
Lymphs Abs: 3 10*3/uL (ref 0.7–4.0)
MCH: 30.4 pg (ref 26.0–34.0)
MCHC: 32.8 g/dL (ref 30.0–36.0)
MCV: 92.7 fL (ref 78.0–100.0)
Monocytes Absolute: 0.7 10*3/uL (ref 0.1–1.0)
Monocytes Relative: 9 %
Neutro Abs: 3.9 10*3/uL (ref 1.7–7.7)
Neutrophils Relative %: 49 %
Platelets: 289 10*3/uL (ref 150–400)
RBC: 4.38 MIL/uL (ref 3.87–5.11)
RDW: 14.4 % (ref 11.5–15.5)
WBC: 7.8 10*3/uL (ref 4.0–10.5)

## 2015-12-31 MED ORDER — SODIUM CHLORIDE 0.9 % IV SOLN
INTRAVENOUS | Status: DC
  Administered 2015-12-31: 09:00:00 via INTRAVENOUS

## 2015-12-31 MED ORDER — ACETAMINOPHEN 325 MG PO TABS: 650.0000 mg | ORAL_TABLET | ORAL | Status: DC

## 2015-12-31 MED ORDER — SODIUM CHLORIDE 0.9 % IV SOLN
500.0000 mg | INTRAVENOUS | Status: DC
  Administered 2015-12-31: 500 mg via INTRAVENOUS
  Filled 2015-12-31: qty 50

## 2015-12-31 MED ORDER — DIPHENHYDRAMINE HCL 25 MG PO CAPS: 25.0000 mg | ORAL_CAPSULE | ORAL | Status: DC

## 2016-02-07 ENCOUNTER — Ambulatory Visit (INDEPENDENT_AMBULATORY_CARE_PROVIDER_SITE_OTHER): Payer: 59 | Admitting: Family Medicine

## 2016-02-07 ENCOUNTER — Encounter: Payer: Self-pay | Admitting: Family Medicine

## 2016-02-07 VITALS — BP 136/100 | HR 84 | Temp 98.3°F | Resp 16 | Wt 179.0 lb

## 2016-02-07 DIAGNOSIS — R509 Fever, unspecified: Secondary | ICD-10-CM | POA: Diagnosis not present

## 2016-02-07 NOTE — Progress Notes (Signed)
Subjective:    Patient ID: Courtney Myers, female    DOB: 16-Dec-1960, 55 y.o.   MRN: [id number]  HPI Patient was seen by my partner June 13 for fever. She had an elevated temperature of 100.6.  She was started on a Z-Pak for possible URI.  She is immunocompromised on remicade for RA.  Of note, her first infection occur possibly 6 weeks ago it involves high fevers and body aches but no other symptoms. Symptoms improved shortly after receiving her Remicade injection. She is now 6 weeks later. She is due for repeat Remicade injection on Friday. She again is having 5 days of fever 101-102. Otherwise her only complaint is fatigue. She denies any rhinorrhea, sore throat, otalgia, sinus pain, sore throat, chest congestion, cough, nausea vomiting or diarrhea. She denies any dysuria. She does have a violaceous rash on both elbows was fine silvery scale that appears almost psoriatic in nature although is difficult to tell. She denies any tick bites. She denies any exposure to [place].   Past Medical History:  Diagnosis Date  . Anxiety   . Cervical dystonia   . Depression   . Fibromyalgia   . GERD (gastroesophageal reflux disease)   . History of shingles   . RA (rheumatoid arthritis) ([place])   . Synovitis of hand    Past Surgical History:  Procedure Laterality Date  . TONSILLECTOMY    . VAGINAL HYSTERECTOMY  3/08   Current Outpatient Prescriptions on File Prior to Visit  Medication Sig Dispense Refill  . AbobotulinumtoxinA (DYSPORT) 500 UNITS SOLR injection Inject 500 Units into the muscle See admin instructions. Take every three months per patient. Last dose was sometime in January per patient    . azithromycin (ZITHROMAX) 250 MG tablet Take 2 tablets x 1 day, then 1 tab daily for 4 days 6 tablet 0  . B-D TB SYRINGE 1CC/27GX1/2" 27G X 1/2" 1 ML MISC   1  . baclofen (LIORESAL) 10 MG tablet Take 10 mg by mouth daily as needed for muscle spasms.     . clonazePAM (KLONOPIN) 0.5 MG tablet Take 1 tablet  (0.5 mg total) by mouth 2 (two) times daily as needed for anxiety. 60 tablet 5  . DULoxetine (CYMBALTA) 60 MG capsule Take 60 mg by mouth daily.    . folic acid (FOLVITE) 1 MG tablet Take 1 mg by mouth 2 (two) times daily.     Marland Kitchen ibuprofen (ADVIL,MOTRIN) 600 MG tablet Take 1 tablet (600 mg total) by mouth every 6 (six) hours as needed. 30 tablet 0  . inFLIXimab (REMICADE) 100 MG injection Inject 100 mg into the vein every 8 (eight) weeks. Last dose was about January 31st per patient    . methocarbamol (ROBAXIN) 500 MG tablet Take 1 tablet (500 mg total) by mouth 2 (two) times daily. 20 tablet 0  . methotrexate 50 MG/2ML injection   0  . Multiple Vitamins-Minerals (MULTIVITAMINS THER. W/MINERALS) TABS Take 1 tablet by mouth daily.    . Omega-3 Fatty Acids (FISH OIL PO) Take 1,500 mg by mouth.    Marland Kitchen omeprazole (PRILOSEC) 20 MG capsule TAKE (1) CAPSULE BY MOUTH EVERY DAY. 30 capsule 11  . traMADol (ULTRAM) 50 MG tablet Take 1 tablet (50 mg total) by mouth every 6 (six) hours as needed. For pain 20 tablet 0   No current facility-administered medications on file prior to visit.    No Known Allergies Social History   Social History  . Marital status: Married  Spouse name: [name]  . Number of children: 2  . Years of education: 12   Occupational History  .  [place] History Main Topics  . Smoking status: Current Every Day Smoker    Packs/day: 0.50    Years: 30.00    Types: Cigarettes    Last attempt to quit: 07/03/2012  . Smokeless tobacco: Never Used  . Alcohol use 0.0 oz/week     Comment: Rare  . Drug use: No  . Sexual activity: Yes    Birth control/ protection: Surgical     Comment: 1st intercourse 55 yo-Fewer than 5 partners   Other Topics Concern  . 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 Right handed.    Caffeine- 4 cups daily.       Review of Systems  All other systems reviewed and are negative.      Objective:   Physical Exam  Constitutional: She appears well-developed and well-nourished. No distress.  HENT:  Right Ear: External ear normal.  Left Ear: External ear normal.  Nose: Nose normal.  Mouth/Throat: Oropharynx is clear and moist. No oropharyngeal exudate.  Eyes: Conjunctivae are normal.  Neck: Neck supple.  Cardiovascular: Normal rate, regular rhythm and normal heart sounds.   No murmur heard. Pulmonary/Chest: Effort normal and breath sounds normal. No respiratory distress. She has no wheezes. She has no rales.  Abdominal: Soft. Bowel sounds are normal. She exhibits no distension. There is no tenderness. There is no rebound and no guarding.  Musculoskeletal: She exhibits no edema or tenderness.  Lymphadenopathy:    She has no cervical adenopathy.  Skin: Rash noted. She is not diaphoretic.  Vitals reviewed.         Assessment & Plan:  Fever, unknown origin - Plan: CBC with Differential/Platelet, COMPLETE METABOLIC PANEL WITH GFR, ANA, B. burgdorfi antibodies by WB, Lactate dehydrogenase, Sedimentation rate, Rheumatoid factor  This is the second time, the patient has had a low-grade fever and fatigue one week prior to her Remicade injection. This is also the second time that there are no symptoms of any infection. I believe this is not a coincidence. I believe her fever of unknown origin is likely due to her connective tissue disease.  I will check a CBC, CMP, ANA, Lyme titer, LDH, sedimentation rate, and rheumatoid factor. If lab work shows no evidence of infection, I will recommend having the patient follow with rheumatologist to determine if they believe this could be autoimmune in origin. If there is a significantly elevated white blood cell count, I would next proceed to imaging of the chest and abdomen

## 2016-02-08 LAB — CBC WITH DIFFERENTIAL/PLATELET
Basophils Absolute: 79 cells/uL (ref 0–200)
Basophils Relative: 1 %
Eosinophils Absolute: 237 cells/uL (ref 15–500)
Eosinophils Relative: 3 %
HCT: 42.1 % (ref 35.0–45.0)
Hemoglobin: 13.8 g/dL (ref 12.0–15.0)
Lymphocytes Relative: 42 %
Lymphs Abs: 3318 cells/uL (ref 850–3900)
MCH: 29.7 pg (ref 27.0–33.0)
MCHC: 32.8 g/dL (ref 32.0–36.0)
MCV: 90.5 fL (ref 80.0–100.0)
MPV: 11.7 fL (ref 7.5–12.5)
Monocytes Absolute: 711 cells/uL (ref 200–950)
Monocytes Relative: 9 %
Neutro Abs: 3555 cells/uL (ref 1500–7800)
Neutrophils Relative %: 45 %
Platelets: 226 10*3/uL (ref 140–400)
RBC: 4.65 MIL/uL (ref 3.80–5.10)
RDW: 13.8 % (ref 11.0–15.0)
WBC: 7.9 10*3/uL (ref 3.8–10.8)

## 2016-02-08 LAB — COMPLETE METABOLIC PANEL WITH GFR
ALT: 16 U/L (ref 6–29)
AST: 21 U/L (ref 10–35)
Albumin: 4.3 g/dL (ref 3.6–5.1)
Alkaline Phosphatase: 81 U/L (ref 33–130)
BUN: 7 mg/dL (ref 7–25)
CO2: 26 mmol/L (ref 20–31)
Calcium: 9.4 mg/dL (ref 8.6–10.4)
Chloride: 104 mmol/L (ref 98–110)
Creat: 0.73 mg/dL (ref 0.50–1.05)
GFR, Est African American: >89 mL/min (ref >=60)
GFR, Est Non African American: >89 mL/min (ref >=60)
Glucose, Bld: 81 mg/dL (ref 70–99)
Potassium: 4 mmol/L (ref 3.5–5.3)
Sodium: 141 mmol/L (ref 135–146)
Total Bilirubin: 0.4 mg/dL (ref 0.2–1.2)
Total Protein: 7 g/dL (ref 6.1–8.1)

## 2016-02-08 LAB — RHEUMATOID FACTOR: Rhuematoid fact SerPl-aCnc: <10 IU/mL (ref <=14)

## 2016-02-08 LAB — ANTI-NUCLEAR AB-TITER (ANA TITER): ANA Titer 1: 1:640 {titer} — ABNORMAL HIGH

## 2016-02-08 LAB — LACTATE DEHYDROGENASE: LDH: 196 U/L (ref 120–250)

## 2016-02-08 LAB — ANA: Anti Nuclear Antibody(ANA): POSITIVE — AB

## 2016-02-08 LAB — SEDIMENTATION RATE: Sed Rate: 20 mm/hr (ref 0–30)

## 2016-02-09 ENCOUNTER — Encounter: Payer: Self-pay | Admitting: Family Medicine

## 2016-02-10 ENCOUNTER — Other Ambulatory Visit (HOSPITAL_COMMUNITY): Payer: Self-pay | Admitting: *Deleted

## 2016-02-10 ENCOUNTER — Encounter: Payer: Self-pay | Admitting: Family Medicine

## 2016-02-11 ENCOUNTER — Ambulatory Visit (HOSPITAL_COMMUNITY)
Admission: RE | Admit: 2016-02-11 | Discharge: 2016-02-11 | Disposition: A | Discharge status: Home / Self Care | Payer: 59 | Source: Ambulatory Visit | Attending: Rheumatology | Admitting: Rheumatology

## 2016-02-11 DIAGNOSIS — M0609 Rheumatoid arthritis without rheumatoid factor, multiple sites: Secondary | ICD-10-CM | POA: Insufficient documentation

## 2016-02-11 LAB — LYME ABY, WSTRN BLT IGG & IGM W/BANDS

## 2016-02-11 MED ORDER — SODIUM CHLORIDE 0.9 % IV SOLN
INTRAVENOUS | Status: DC
  Administered 2016-02-11: 08:00:00 via INTRAVENOUS

## 2016-02-11 MED ORDER — INFLIXIMAB 100 MG IV SOLR
500.0000 mg | INTRAVENOUS | Status: AC
  Administered 2016-02-11: 500 mg via INTRAVENOUS
  Filled 2016-02-11: qty 50

## 2016-02-11 MED ORDER — ACETAMINOPHEN 325 MG PO TABS: 650.0000 mg | ORAL_TABLET | ORAL | Status: DC

## 2016-02-11 MED ORDER — DIPHENHYDRAMINE HCL 25 MG PO CAPS
25.0000 mg | ORAL_CAPSULE | ORAL | Status: DC
Start: 2016-02-11 — End: 2016-02-12

## 2016-02-15 ENCOUNTER — Encounter: Payer: Self-pay | Admitting: Family Medicine

## 2016-02-24 ENCOUNTER — Telehealth: Payer: Self-pay | Admitting: Neurology

## 2016-02-24 NOTE — Telephone Encounter (Signed)
Pt would like to make appt for Dysport injection. Please call [phone]

## 2016-03-02 NOTE — Telephone Encounter (Signed)
Patient called, states she called last week and hasn't heard from anyone regarding scheduling Dysport injection.

## 2016-03-02 NOTE — Telephone Encounter (Signed)
Would you mind calling this patient to schedule her apt?

## 2016-03-02 NOTE — Telephone Encounter (Signed)
It will be SP. Thanks Hinton Dyer!

## 2016-03-02 NOTE — Telephone Encounter (Signed)
Called [facility] and they will verify he benefits and call back is needed in 24 hours I will relay message to [facility] and call Patient to schedule . [phone].  Marcie Bal Can Call per [facility] after 1:00 pm on Sept. 1st  to give consent .   I have spoke to Janil she is scheduled with Dr. Krista Blue Sept 13 th at 8:00 am . She will call [facility] Sept 1 st after 1:oo and give consent . Danielle will do TBD Thanks Danielle.

## 2016-03-02 NOTE — Telephone Encounter (Signed)
I will call and schedule her . Is she [facility] or Does Dysport needed to be shipped ? Sp

## 2016-03-06 DIAGNOSIS — M069 Rheumatoid arthritis, unspecified: Secondary | ICD-10-CM

## 2016-03-07 NOTE — Telephone Encounter (Signed)
Patient's Dysport will be delivered Friday 03/10/2016 . Patient 's apt 03-15-2016. Thanks Hinton Dyer.

## 2016-03-13 ENCOUNTER — Other Ambulatory Visit: Payer: Self-pay | Admitting: Dermatology

## 2016-03-15 ENCOUNTER — Ambulatory Visit (INDEPENDENT_AMBULATORY_CARE_PROVIDER_SITE_OTHER): Payer: 59 | Admitting: Neurology

## 2016-03-15 ENCOUNTER — Encounter: Payer: Self-pay | Admitting: Neurology

## 2016-03-15 VITALS — BP 108/72 | HR 86 | Ht 64.0 in | Wt 178.5 lb

## 2016-03-15 DIAGNOSIS — G243 Spasmodic torticollis: Secondary | ICD-10-CM | POA: Diagnosis not present

## 2016-03-15 NOTE — Progress Notes (Signed)
**  Dysport 500 units x 1 vial, Lot [id number], Exp 09/30/2016, [place] [id number], specialty pharmacy//[email or web address].**

## 2016-03-15 NOTE — Progress Notes (Signed)
PATIENT: Courtney Myers DOB: Apr 15, 1961  HISTORICAL  Courtney Myers  is a 55 year-old right-handed Caucasian female, came in for EMG guided Botox for cervical dystonia.  Symptom onset was without apparent triggering event, she has gradually developed this neck pulling,  manifested primarily with right laterocollis, mild retrocolis, and left shoulder elevation since 2000. She has good relief with regular Botox injection, about 4 times a year since 2003. Last injection was February 18, 2010 by Dr. Marta Antu,  She reported great relief as usual, taking 4- 5 days to get symptom relieve, lasting about 3 months.  I began to inject her since 12.2011, every 3-4 months, dosage has been limited to 150 units of BOTOX A.  She denies neck pain, gait difficulty, but with wearing off Botox, she noticed increased head tremor, and pulling towards her right shoulder.  She developed  transient difficulty lifting her neck up when using BOTOX A 200 units previously, reponding well to decreased dose of 150 units  She was enrolled into IPSEN dysport study, first injection was in April 30th 2013, she later enrolled into open label, 2nd injection was in  May 28th 2013. She did very well.  Recent few weeks,  She had experienced flareup of her rheumatoid arthritis, has received IV infusion. Last injection was in 06/10/2012,  500 units of dysport was dissolved into 2 cc of normal saline. Right longissimus capitus 0.5 cc Right splenius capitis 0.5 cc Right levator scapular 0.5 cc Right iliocostalis 0.5 cc   Dysport research study has completed, she is now coming back for repeat injection, she noticed head bobbing over the past few weeks, she denies significant neck pain, weakness,  Her rheumatoid arthritis is under better control   Last injection was in Sep 2014, she did very well, no neck extension weakness, only rarely neck shaking  UPDATE March 8th 2016: She has lost follow-up since her last visit December 2014,  She came in for treatment her cervical dystonia, she feel the tension of her neck all the time, stiffness. Denied gait difficulty, last injection was September 2014, she received 500 units of Dysport, works well for her, she is continue on medication for anxiety, rheumatoid arthritis She also complains of nighttime snoring, frequent awakening, excessive daytime sleepiness, fatigue, today's ESS is 6, FSS was 35,  UPDATE October 13 2014:   She has missed her scheduled sleep study, continue have worsening neck posturing, posterior neck pain, excessive daytime fatigue and sleepiness  UPDATE Nov 11 2014: She came in for EMG guided Dysport injection today, last injection was December 2014, she complains of worsening head titubation, bilateral hands mild postural tremor, posterior neck pain,  Update February 17 2015 She responded very well to last EMG guided Dysport injection Nov 11 2014, no significant head titubation, no significant neck pain  Update July 22 2015: Last injection was August, now she noticed returning of neck pulling and shaking, posterior neck muscle achy pain  Update October 21 2015: Last EMG guided Dysport injection was in January, she responded very well, only recent couple weeks, she noticed returning of neck pulling, shaking again, She has worsening rheumatoid arthritis, is receiving more frequent treatment, complains fatigue, diffuse body achy pain  Update March 15 2016: She responded very well to previous Dysport injection in April, no significant side effect noticed, only recent few weeks, 5 months from previous injection, she noticed return of head shaking neck muscle tightness.  REVIEW OF SYSTEMS: Full 14 system review of  systems performed and notable only for as above   ALLERGIES: No Known Allergies  HOME MEDICATIONS: Current Outpatient Prescriptions  Medication Sig Dispense Refill  . AbobotulinumtoxinA (DYSPORT) 500 UNITS SOLR injection Inject 500 Units into the  muscle See admin instructions. Take every three months per patient. Last dose was sometime in January per patient    . acetaminophen (TYLENOL) 325 MG tablet Take 650 mg by mouth once. One hour prior to infusion    . B-D TB SYRINGE 1CC/27GX1/2" 27G X 1/2" 1 ML MISC   1  . baclofen (LIORESAL) 10 MG tablet Take 10 mg by mouth daily as needed for muscle spasms.     . clonazePAM (KLONOPIN) 0.5 MG tablet Take 1 tablet (0.5 mg total) by mouth 2 (two) times daily as needed for anxiety. 60 tablet 5  . diphenhydrAMINE (BENADRYL) 25 mg capsule Take 25 mg by mouth once. One hour prior to infusion    . DULoxetine (CYMBALTA) 60 MG capsule Take 60 mg by mouth daily.    . folic acid (FOLVITE) 1 MG tablet Take 1 mg by mouth 2 (two) times daily.     Marland Kitchen ibuprofen (ADVIL,MOTRIN) 600 MG tablet Take 1 tablet (600 mg total) by mouth every 6 (six) hours as needed. 30 tablet 0  . inFLIXimab (REMICADE) 100 MG injection Inject 6 mg/kg into the vein every 6 (six) weeks. Infuse 500 mg/ 5 vials in 257mL NS over 2hours every 6 weeks    . methotrexate 50 MG/2ML injection   0  . Multiple Vitamins-Minerals (MULTIVITAMINS THER. W/MINERALS) TABS Take 1 tablet by mouth daily.    . Omega-3 Fatty Acids (FISH OIL PO) Take 1,500 mg by mouth.    Marland Kitchen omeprazole (PRILOSEC) 20 MG capsule TAKE (1) CAPSULE BY MOUTH EVERY DAY. 30 capsule 11  . traMADol (ULTRAM) 50 MG tablet Take 1 tablet (50 mg total) by mouth every 6 (six) hours as needed. For pain 20 tablet 0   No current facility-administered medications for this visit.     PAST MEDICAL HISTORY: Past Medical History:  Diagnosis Date  . Anxiety   . Cervical dystonia   . Depression   . Fibromyalgia   . GERD (gastroesophageal reflux disease)   . History of shingles   . RA (rheumatoid arthritis) ([place])   . Synovitis of hand     PAST SURGICAL HISTORY: Past Surgical History:  Procedure Laterality Date  . TONSILLECTOMY    . VAGINAL HYSTERECTOMY  3/08    FAMILY HISTORY: Family  History  Problem Relation Age of Onset  . Arthritis Mother   . Heart Problems Father   . Heart disease Father   . Heart disease Paternal Uncle     MI    SOCIAL HISTORY:  Social History   Social History  . Marital status: Married    Spouse name: [name]  . Number of children: 2  . Years of education: 12   Occupational History  .  [place] History Main Topics  . Smoking status: Current Every Day Smoker    Packs/day: 0.50    Years: 30.00    Types: Cigarettes    Last attempt to quit: 07/03/2012  . Smokeless tobacco: Never Used  . Alcohol use 0.0 oz/week     Comment: Rare  . Drug use: No  . Sexual activity: Yes    Birth control/ protection: Surgical     Comment: 1st intercourse 55 yo-Fewer than 5 partners   Other Topics Concern  .  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 Right handed.    Caffeine- 4 cups daily.     PHYSICAL EXAM   Vitals:   03/15/16 0756  BP: 108/72  Pulse: 86  Weight: 178 lb 8 oz (81 kg)  Height: 5\' 4"  (1.626 m)    Not recorded      Body mass index is 30.64 kg/m.  PHYSICAL EXAMNIATION: MENTAL STATUS: Speech is normal, normal to casual conversation, and history taking  CRANIAL NERVES: mild retrocollis, right tilt, mild right lateral shift, slight left shoulder elevation  MOTOR: Normal tone and bulk and strength COORDINATION: No dysmetria   GAIT/STANCE: Posture is normal.  DIAGNOSTIC DATA (LABS, IMAGING, TESTING) - I reviewed patient records, labs, notes, testing and imaging myself where available.  ASSESSMENT AND PLAN  NADEAN SAPIR is a 55 y.o. female with long-standing history of cervical dystonia, responded very well to previous EMG guided Dysport injection, last injection was May 2016, responded very well.   Mild retrocollis, right tilt, mild right lateral shift, slight left shoulder  elevation  500 of Dysport was dissolved into 2.5 cc of normal saline,  Right longissimus capitus 0. 25cc Right semispinalis 0.2 5 cc, Right inferior oblique capitis, 0.25 cc Left inferior oblique capitis 0.25 cc  Right splenius capitis 0.25 ccx2=0.5 cc Right Splenius cervix 0.25 cc  Left splenius capitis 0.25 Left splenius cervix 0.25 Left semispinalis 0.25  Patient tolerate the injection well, will return to clinic in 3 months for repeat injection Please dissolve 500 units of Dysport into 2.5 cc of normal saline   Marcial Pacas, M.D. Ph.D.  [facility] [address], [place], [place] [address] Ph: [phone] Fax: [phone]

## 2016-03-20 ENCOUNTER — Other Ambulatory Visit: Payer: Self-pay | Admitting: Neurology

## 2016-03-24 ENCOUNTER — Ambulatory Visit (HOSPITAL_COMMUNITY)
Admission: RE | Admit: 2016-03-24 | Discharge: 2016-03-24 | Disposition: A | Discharge status: Home / Self Care | Payer: 59 | Source: Ambulatory Visit | Attending: Rheumatology | Admitting: Rheumatology

## 2016-03-24 DIAGNOSIS — M0609 Rheumatoid arthritis without rheumatoid factor, multiple sites: Secondary | ICD-10-CM | POA: Insufficient documentation

## 2016-03-24 MED ORDER — SODIUM CHLORIDE 0.9 % IV SOLN
500.0000 mg | INTRAVENOUS | Status: DC
  Administered 2016-03-24: 500 mg via INTRAVENOUS
  Filled 2016-03-24: qty 50

## 2016-03-24 MED ORDER — ACETAMINOPHEN 325 MG PO TABS: 650.0000 mg | ORAL_TABLET | ORAL | Status: DC

## 2016-03-24 MED ORDER — DIPHENHYDRAMINE HCL 25 MG PO CAPS: 25.0000 mg | ORAL_CAPSULE | Freq: Once | ORAL | Status: DC

## 2016-03-24 MED ORDER — SODIUM CHLORIDE 0.9 % IV SOLN: INTRAVENOUS | Status: DC

## 2016-05-05 ENCOUNTER — Encounter (HOSPITAL_COMMUNITY): Payer: 59

## 2016-05-12 ENCOUNTER — Encounter (HOSPITAL_COMMUNITY): Payer: Self-pay

## 2016-05-12 ENCOUNTER — Ambulatory Visit (HOSPITAL_COMMUNITY)
Admission: RE | Admit: 2016-05-12 | Discharge: 2016-05-12 | Disposition: A | Discharge status: Home / Self Care | Payer: 59 | Source: Ambulatory Visit | Attending: Rheumatology | Admitting: Rheumatology

## 2016-05-12 ENCOUNTER — Telehealth: Payer: Self-pay | Admitting: Radiology

## 2016-05-12 DIAGNOSIS — M0609 Rheumatoid arthritis without rheumatoid factor, multiple sites: Secondary | ICD-10-CM | POA: Insufficient documentation

## 2016-05-12 LAB — CBC WITH DIFFERENTIAL/PLATELET
Basophils Absolute: 0.1 10*3/uL (ref 0.0–0.1)
Basophils Relative: 1 %
Eosinophils Absolute: 0.2 10*3/uL (ref 0.0–0.7)
Eosinophils Relative: 2 %
HCT: 37.4 % (ref 36.0–46.0)
Hemoglobin: 12.4 g/dL (ref 12.0–15.0)
Lymphocytes Relative: 31 %
Lymphs Abs: 2.3 10*3/uL (ref 0.7–4.0)
MCH: 29.5 pg (ref 26.0–34.0)
MCHC: 33.2 g/dL (ref 30.0–36.0)
MCV: 88.8 fL (ref 78.0–100.0)
Monocytes Absolute: 0.7 10*3/uL (ref 0.1–1.0)
Monocytes Relative: 9 %
Neutro Abs: 4.2 10*3/uL (ref 1.7–7.7)
Neutrophils Relative %: 57 %
Platelets: 203 10*3/uL (ref 150–400)
RBC: 4.21 MIL/uL (ref 3.87–5.11)
RDW: 13.7 % (ref 11.5–15.5)
WBC: 7.5 10*3/uL (ref 4.0–10.5)

## 2016-05-12 LAB — COMPREHENSIVE METABOLIC PANEL
ALT: 21 U/L (ref 14–54)
AST: 32 U/L (ref 15–41)
Albumin: 3.6 g/dL (ref 3.5–5.0)
Alkaline Phosphatase: 70 U/L (ref 38–126)
Anion gap: 9 (ref 5–15)
BUN: 15 mg/dL (ref 6–20)
CO2: 23 mmol/L (ref 22–32)
Calcium: 9 mg/dL (ref 8.9–10.3)
Chloride: 103 mmol/L (ref 101–111)
Creatinine, Ser: 0.9 mg/dL (ref 0.44–1.00)
GFR calc Af Amer: >60 mL/min (ref >60)
GFR calc non Af Amer: >60 mL/min (ref >60)
Glucose, Bld: 92 mg/dL (ref 65–99)
Potassium: 4.1 mmol/L (ref 3.5–5.1)
Sodium: 135 mmol/L (ref 135–145)
Total Bilirubin: 0.9 mg/dL (ref 0.3–1.2)
Total Protein: 6.6 g/dL (ref 6.5–8.1)

## 2016-05-12 MED ORDER — ACETAMINOPHEN 325 MG PO TABS: 650.0000 mg | ORAL_TABLET | ORAL | Status: DC

## 2016-05-12 MED ORDER — DIPHENHYDRAMINE HCL 25 MG PO CAPS: 25.0000 mg | ORAL_CAPSULE | Freq: Once | ORAL | Status: DC

## 2016-05-12 MED ORDER — SODIUM CHLORIDE 0.9 % IV SOLN
500.0000 mg | INTRAVENOUS | Status: DC
  Administered 2016-05-12: 500 mg via INTRAVENOUS
  Filled 2016-05-12: qty 10

## 2016-05-12 MED ORDER — SODIUM CHLORIDE 0.9 % IV SOLN
INTRAVENOUS | Status: DC
  Administered 2016-05-12: 09:00:00 via INTRAVENOUS

## 2016-05-12 NOTE — Progress Notes (Signed)
Normal labs.

## 2016-05-12 NOTE — Telephone Encounter (Signed)
-----   Message from Bo Merino, MD sent at 05/12/2016 12:26 PM EST ----- Normal labs

## 2016-05-12 NOTE — Telephone Encounter (Signed)
I have called patient to advise labs are normal  

## 2016-06-01 ENCOUNTER — Telehealth: Payer: Self-pay | Admitting: Neurology

## 2016-06-01 NOTE — Telephone Encounter (Signed)
[facility] [phone] calling to confirm to ship dysport. Please call

## 2016-06-05 NOTE — Telephone Encounter (Signed)
Called pharmacy and scheduled delivery.

## 2016-06-15 ENCOUNTER — Encounter: Payer: Self-pay | Admitting: Neurology

## 2016-06-15 ENCOUNTER — Ambulatory Visit (INDEPENDENT_AMBULATORY_CARE_PROVIDER_SITE_OTHER): Payer: 59 | Admitting: Neurology

## 2016-06-15 VITALS — BP 123/81 | HR 104 | Ht 65.0 in | Wt 182.0 lb

## 2016-06-15 DIAGNOSIS — G243 Spasmodic torticollis: Secondary | ICD-10-CM | POA: Diagnosis not present

## 2016-06-15 NOTE — Progress Notes (Signed)
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  Symptom onset was without apparent triggering event, she has gradually developed this neck pulling,  manifested primarily with right laterocollis, mild retrocolis, and left shoulder elevation since 2000. She has good relief with regular Botox injection, about 4 times a year since 2003. Last injection was February 18, 2010 by Dr. Marta Antu,  She reported great relief as usual, taking 4- 5 days to get symptom relieve, lasting about 3 months.  I began to inject her since 12.2011, every 3-4 months, dosage has been limited to 150 units of BOTOX A.  She denies neck pain, gait difficulty, but with wearing off Botox, she noticed increased head tremor, and pulling towards her right shoulder.  She developed  transient difficulty lifting her neck up when using BOTOX A 200 units previously, reponding well to decreased dose of 150 units  She was enrolled into IPSEN dysport study, first injection was in April 30th 2013, she later enrolled into open label, 2nd injection was in  May 28th 2013. She did very well.  Recent few weeks,  She had experienced flareup of her rheumatoid arthritis, has received IV infusion. Last injection was in 06/10/2012,  500 units of dysport was dissolved into 2 cc of normal saline. Right longissimus capitus 0.5 cc Right splenius capitis 0.5 cc Right levator scapular 0.5 cc Right iliocostalis 0.5 cc   Dysport research study has completed, she is now coming back for repeat injection, she noticed head bobbing over the past few weeks, she denies significant neck pain, weakness,  Her rheumatoid arthritis is under better control   Last injection was in Sep 2014, she did very well, no neck extension weakness, only rarely neck shaking  UPDATE March 8th 2016: She has lost follow-up since her last visit December 2014,  She came in for treatment her cervical dystonia, she feel the tension of her neck all the time, stiffness. Denied gait difficulty, last injection was September 2014, she received 500 units of Dysport, works well for her, she is continue on medication for anxiety, rheumatoid arthritis She also complains of nighttime snoring, frequent awakening, excessive daytime sleepiness, fatigue, today's ESS is 6, FSS was 72,  UPDATE October 13 2014:   She has missed her scheduled sleep study, continue have worsening neck posturing, posterior neck pain, excessive daytime fatigue and sleepiness  UPDATE Nov 11 2014: She came in for EMG guided Dysport injection today, last injection was December 2014, she complains of worsening head titubation, bilateral hands mild postural tremor, posterior neck pain,  Update February 17 2015 She responded very well to last EMG guided Dysport injection Nov 11 2014, no significant head titubation, no significant neck pain  Update July 22 2015: Last injection was August, now she noticed returning of neck pulling and shaking, posterior neck muscle achy pain  Update October 21 2015: Last EMG guided Dysport injection was in January, she responded very well, only recent couple weeks, she noticed returning of neck pulling, shaking again, She has worsening rheumatoid arthritis, is receiving more frequent treatment, complains fatigue, diffuse body achy pain  Update March 15 2016: She responded very well to previous Dysport injection in April, no significant side effect noticed, only recent few weeks, 5 months from previous injection, she noticed return of head shaking neck muscle tightness.  UPDATE Dec 14th 2017: She responded very well  to previous injection in Sept 2017, no significant side effect noticed.  REVIEW OF SYSTEMS: Full 14 system review of systems performed and notable only for as above   ALLERGIES: No Known Allergies  HOME MEDICATIONS: Current Outpatient  Prescriptions  Medication Sig Dispense Refill  . AbobotulinumtoxinA (DYSPORT) 500 UNITS SOLR injection Inject 500 Units into the muscle See admin instructions. Take every three months per patient. Last dose was sometime in January per patient    . acetaminophen (TYLENOL) 325 MG tablet Take 650 mg by mouth once. One hour prior to infusion    . B-D TB SYRINGE 1CC/27GX1/2" 27G X 1/2" 1 ML MISC   1  . baclofen (LIORESAL) 10 MG tablet Take 10 mg by mouth daily as needed for muscle spasms.     . clonazePAM (KLONOPIN) 0.5 MG tablet TAKE 1 TABLET BY MOUTH 2 TIMES DAILY AS NEEDED FOR ANXIETY 60 tablet 5  . diphenhydrAMINE (BENADRYL) 25 mg capsule Take 25 mg by mouth once. One hour prior to infusion    . DULoxetine (CYMBALTA) 60 MG capsule Take 60 mg by mouth daily.    . folic acid (FOLVITE) 1 MG tablet Take 1 mg by mouth 2 (two) times daily.     Marland Kitchen ibuprofen (ADVIL,MOTRIN) 600 MG tablet Take 1 tablet (600 mg total) by mouth every 6 (six) hours as needed. 30 tablet 0  . inFLIXimab (REMICADE) 100 MG injection Inject 6 mg/kg into the vein every 6 (six) weeks. Infuse 500 mg/ 5 vials in 260mL NS over 2hours every 6 weeks    . methotrexate 50 MG/2ML injection   0  . Multiple Vitamins-Minerals (MULTIVITAMINS THER. W/MINERALS) TABS Take 1 tablet by mouth daily.    . Omega-3 Fatty Acids (FISH OIL PO) Take 1,500 mg by mouth.    Marland Kitchen omeprazole (PRILOSEC) 20 MG capsule TAKE (1) CAPSULE BY MOUTH EVERY DAY. 30 capsule 11  . traMADol (ULTRAM) 50 MG tablet Take 1 tablet (50 mg total) by mouth every 6 (six) hours as needed. For pain 20 tablet 0   No current facility-administered medications for this visit.     PAST MEDICAL HISTORY: Past Medical History:  Diagnosis Date  . Anxiety   . Cervical dystonia   . Depression   . Fibromyalgia   . GERD (gastroesophageal reflux disease)   . History of shingles   . RA (rheumatoid arthritis) ([place])   . Synovitis of hand     PAST SURGICAL HISTORY: Past Surgical History:    Procedure Laterality Date  . TONSILLECTOMY    . VAGINAL HYSTERECTOMY  3/08    FAMILY HISTORY: Family History  Problem Relation Age of Onset  . Arthritis Mother   . Heart Problems Father   . Heart disease Father   . Heart disease Paternal Uncle     MI    SOCIAL HISTORY:  Social History   Social History  . Marital status: Married    Spouse name: [name]  . Number of children: 2  . Years of education: 12   Occupational History  .  [place] History Main Topics  . Smoking status: Current Every Day Smoker    Packs/day: 0.50    Years: 30.00    Types: Cigarettes    Last attempt to quit: 07/03/2012  . Smokeless tobacco: Never Used  . Alcohol use 0.0 oz/week     Comment: Rare  . Drug use: No  . Sexual activity: Yes    Birth control/ protection: Surgical  Comment: 1st intercourse 55 yo-Fewer than 5 partners   Other Topics Concern  . 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 Right handed.    Caffeine- 4 cups daily.     PHYSICAL EXAM   Vitals:   06/15/16 1423  BP: 123/81  Pulse: (!) 104  Weight: 182 lb (82.6 kg)  Height: 5\' 5"  (1.651 m)    Not recorded      Body mass index is 30.29 kg/m.  PHYSICAL EXAMNIATION: MENTAL STATUS: Speech is normal, normal to casual conversation, and history taking  CRANIAL NERVES: mild retrocollis, right tilt, mild right lateral shift, slight left shoulder elevation  MOTOR: Normal tone and bulk and strength COORDINATION: No dysmetria   GAIT/STANCE: Posture is normal.  DIAGNOSTIC DATA (LABS, IMAGING, TESTING) - I reviewed patient records, labs, notes, testing and imaging myself where available.  ASSESSMENT AND PLAN  MELENI SAMAHA is a 55 y.o. female with long-standing history of cervical dystonia, responded very well to previous EMG guided Dysport injection, last injection was May  2016, responded very well.   Mild retrocollis, right tilt, mild right lateral shift, slight left shoulder elevation  500 of Dysport was dissolved into 2.5 cc of normal saline,  Right longissimus capitus 0. 25cc Right semispinalis 0.2 5 cc, Right inferior oblique capitis, 0.25 cc Left inferior oblique capitis 0.25 cc  Right splenius capitis 0.25 ccx2=0.5 cc Right Splenius cervix 0.25 cc  Left splenius capitis 0.25 Left splenius cervix 0.25 Left semispinalis 0.25  Patient tolerate the injection well, will return to clinic in 3 months for repeat injection Please dissolve 500 units of Dysport into 2.5 cc of normal saline   Marcial Pacas, M.D. Ph.D.  [facility] [address], [place],  [address] Ph: [phone] Fax: [phone]

## 2016-06-15 NOTE — Progress Notes (Signed)
**  Dysport 500 units, Lot [id number], Exp 01/30/17, [id number], specialty pharmacy.//mck,rn**

## 2016-06-22 ENCOUNTER — Other Ambulatory Visit (HOSPITAL_COMMUNITY): Payer: Self-pay | Admitting: *Deleted

## 2016-06-23 ENCOUNTER — Encounter (HOSPITAL_COMMUNITY)
Admission: RE | Admit: 2016-06-23 | Discharge: 2016-06-23 | Disposition: A | Discharge status: Home / Self Care | Payer: 59 | Source: Ambulatory Visit | Attending: Rheumatology | Admitting: Rheumatology

## 2016-06-23 DIAGNOSIS — M069 Rheumatoid arthritis, unspecified: Secondary | ICD-10-CM | POA: Insufficient documentation

## 2016-06-23 LAB — COMPREHENSIVE METABOLIC PANEL
ALT: 19 U/L (ref 14–54)
AST: 23 U/L (ref 15–41)
Albumin: 3.6 g/dL (ref 3.5–5.0)
Alkaline Phosphatase: 78 U/L (ref 38–126)
Anion gap: 8 (ref 5–15)
BUN: 11 mg/dL (ref 6–20)
CO2: 25 mmol/L (ref 22–32)
Calcium: 8.9 mg/dL (ref 8.9–10.3)
Chloride: 104 mmol/L (ref 101–111)
Creatinine, Ser: 0.78 mg/dL (ref 0.44–1.00)
GFR calc Af Amer: >60 mL/min (ref >60)
GFR calc non Af Amer: >60 mL/min (ref >60)
Glucose, Bld: 129 mg/dL — ABNORMAL HIGH (ref 65–99)
Potassium: 3.7 mmol/L (ref 3.5–5.1)
Sodium: 137 mmol/L (ref 135–145)
Total Bilirubin: 0.4 mg/dL (ref 0.3–1.2)
Total Protein: 7.1 g/dL (ref 6.5–8.1)

## 2016-06-23 LAB — CBC WITH DIFFERENTIAL/PLATELET
Basophils Absolute: 0 10*3/uL (ref 0.0–0.1)
Basophils Relative: 1 %
Eosinophils Absolute: 0.1 10*3/uL (ref 0.0–0.7)
Eosinophils Relative: 2 %
HCT: 38.9 % (ref 36.0–46.0)
Hemoglobin: 12.9 g/dL (ref 12.0–15.0)
Lymphocytes Relative: 34 %
Lymphs Abs: 2.3 10*3/uL (ref 0.7–4.0)
MCH: 29.6 pg (ref 26.0–34.0)
MCHC: 33.2 g/dL (ref 30.0–36.0)
MCV: 89.2 fL (ref 78.0–100.0)
Monocytes Absolute: 0.4 10*3/uL (ref 0.1–1.0)
Monocytes Relative: 5 %
Neutro Abs: 4 10*3/uL (ref 1.7–7.7)
Neutrophils Relative %: 58 %
Platelets: 240 10*3/uL (ref 150–400)
RBC: 4.36 MIL/uL (ref 3.87–5.11)
RDW: 13.3 % (ref 11.5–15.5)
WBC: 6.9 10*3/uL (ref 4.0–10.5)

## 2016-06-23 MED ORDER — DIPHENHYDRAMINE HCL 25 MG PO CAPS: 25.0000 mg | ORAL_CAPSULE | Freq: Once | ORAL | Status: DC

## 2016-06-23 MED ORDER — SODIUM CHLORIDE 0.9 % IV SOLN
INTRAVENOUS | Status: DC
  Administered 2016-06-23: 08:00:00 via INTRAVENOUS

## 2016-06-23 MED ORDER — SODIUM CHLORIDE 0.9 % IV SOLN
500.0000 mg | INTRAVENOUS | Status: DC
  Administered 2016-06-23: 500 mg via INTRAVENOUS
  Filled 2016-06-23: qty 30

## 2016-06-23 MED ORDER — ACETAMINOPHEN 325 MG PO TABS: 650.0000 mg | ORAL_TABLET | Freq: Once | ORAL | Status: DC

## 2016-06-23 NOTE — Progress Notes (Signed)
Labs normal.

## 2016-07-17 ENCOUNTER — Telehealth: Payer: Self-pay | Admitting: Pharmacist

## 2016-07-17 ENCOUNTER — Telehealth: Payer: Self-pay | Admitting: Rheumatology

## 2016-07-17 NOTE — Telephone Encounter (Signed)
Reviewed patient's chart to confirm pre-certification for Remicade infusions for patient.  Noted [employer] Approval, [id number], 11/11/2015-11/10/2016, 8 visits.  I called patient and confirmed that her insurance is the same for 2018.     Elisabeth Most, Pharm.D., BCPS, CPP Clinical Pharmacist Pager: [phone] Phone: [phone] 07/17/2016 4:16 PM

## 2016-07-17 NOTE — Telephone Encounter (Signed)
[facility] from [facility] needs orders for patient for Remicade.

## 2016-07-20 ENCOUNTER — Ambulatory Visit: Payer: 59 | Admitting: Rheumatology

## 2016-07-21 ENCOUNTER — Other Ambulatory Visit: Payer: Self-pay | Admitting: Radiology

## 2016-07-21 DIAGNOSIS — M0609 Rheumatoid arthritis without rheumatoid factor, multiple sites: Secondary | ICD-10-CM

## 2016-07-21 NOTE — Progress Notes (Signed)
There are already orders in for patient. Not sure why they have called, they have been signed and held 06/23/16

## 2016-07-21 NOTE — Telephone Encounter (Signed)
TB gold neg in March Orders have already been signed and held/ from 06/23/16

## 2016-07-21 NOTE — Telephone Encounter (Signed)
6mg  / kg  Last visit was 03/22/16 Next visit 07/26/16

## 2016-07-26 ENCOUNTER — Ambulatory Visit (INDEPENDENT_AMBULATORY_CARE_PROVIDER_SITE_OTHER): Payer: 59 | Admitting: Rheumatology

## 2016-07-26 ENCOUNTER — Encounter: Payer: Self-pay | Admitting: Rheumatology

## 2016-07-26 VITALS — BP 124/76 | HR 78 | Ht 65.0 in | Wt 183.0 lb

## 2016-07-26 DIAGNOSIS — F5101 Primary insomnia: Secondary | ICD-10-CM | POA: Diagnosis not present

## 2016-07-26 DIAGNOSIS — M797 Fibromyalgia: Secondary | ICD-10-CM | POA: Diagnosis not present

## 2016-07-26 DIAGNOSIS — M0609 Rheumatoid arthritis without rheumatoid factor, multiple sites: Secondary | ICD-10-CM

## 2016-07-26 DIAGNOSIS — Z79899 Other long term (current) drug therapy: Secondary | ICD-10-CM | POA: Diagnosis not present

## 2016-07-26 DIAGNOSIS — R5382 Chronic fatigue, unspecified: Secondary | ICD-10-CM | POA: Diagnosis not present

## 2016-07-26 DIAGNOSIS — M659 Synovitis and tenosynovitis, unspecified: Secondary | ICD-10-CM | POA: Diagnosis not present

## 2016-07-26 MED ORDER — DICLOFENAC SODIUM 1 % TD GEL: 4.0000 g | Freq: Four times a day (QID) | TRANSDERMAL | 3 refills | Status: AC

## 2016-07-26 MED ORDER — FOLIC ACID 1 MG PO TABS: 1.0000 mg | ORAL_TABLET | Freq: Every day | ORAL | 4 refills | Status: DC

## 2016-07-26 MED ORDER — METHOTREXATE SODIUM CHEMO INJECTION 50 MG/2ML: 7.5000 mg | INTRAMUSCULAR | 0 refills | Status: DC

## 2016-07-26 NOTE — Progress Notes (Signed)
Office Visit Note  Patient: Courtney Myers             Date of Birth: Feb 21, 1961           MRN: [id number]             PCP: Karis Juba, PA-C Referring: Orlena Sheldon, PA-C Visit Date: 07/26/2016 Occupation: '@GUAROCC'$ @    Subjective:  Follow-up Follow-up on rheumatoid arthritis, fibromyalgia, high risk prescription (Remicade and methotrexate  History of Present Illness: Courtney Myers is a 56 y.o. female  Last seen 03/22/2016. Patient is doing well with the rheumatoid arthritis when she was on methotrexate and Remicade. She discontinued methotrexate sometime in mid October. Patient states that her husband had a double bypass surgery in mid October. She was having a lot of fatigue as a side effect from using the methotrexate injectable. She needed to be on top of her game to attend to her husband's needs. As a result she decided to discontinue methotrexate because she knew it was causing a lot of her fatigue and discomfort.  She's been off of methotrexate injectable 0.3 ML since then. She's been having increased stiffness and swelling redness and pain to bilateral hands I lateral wrist and bilateral feet. She wants to return back on methotrexate since medicines like ice pack and Aspercreme did not give her much relief.  Her fibromyalgia is doing well. She rates her discomfort as 2-3 on a scale of 0-10. She does have some ongoing fatigue. She feels much better with weakness because she had stopped the methotrexate but she will go back on the methotrexate as discussed above.  Patient does not have any clinical features of lupus on today's visit. Her ANA is positive and it could be secondary to Remicade.     Activities of Daily Living:  Patient reports morning stiffness for 15 minutes.   Patient Denies nocturnal pain.  Difficulty dressing/grooming: Denies Difficulty climbing stairs: Denies Difficulty getting out of chair: Denies Difficulty using hands for taps, buttons,  cutlery, and/or writing: Reports   Review of Systems  Constitutional: Positive for fatigue.  HENT: Negative for mouth sores and mouth dryness.   Eyes: Negative for dryness.  Respiratory: Negative for shortness of breath.   Gastrointestinal: Negative for constipation and diarrhea.  Musculoskeletal: Positive for myalgias and myalgias.  Skin: Negative for sensitivity to sunlight.  Psychiatric/Behavioral: Positive for sleep disturbance. Negative for decreased concentration.    PMFS History:  Patient Active Problem List   Diagnosis Date Noted  . Excessive sleepiness 09/08/2014  . Fibromyalgia syndrome 08/25/2014  . RA (rheumatoid arthritis) ([place])   . GERD (gastroesophageal reflux disease)   . Anxiety   . Depression   . History of shingles   . Synovitis of hand   . Cervical dystonia 12/04/2012    Past Medical History:  Diagnosis Date  . Anxiety   . Cervical dystonia   . Depression   . Fibromyalgia   . GERD (gastroesophageal reflux disease)   . History of shingles   . RA (rheumatoid arthritis) ([place])   . Synovitis of hand     Family History  Problem Relation Age of Onset  . Arthritis Mother   . Heart Problems Father   . Heart disease Father   . Heart disease Paternal Uncle     MI   Past Surgical History:  Procedure Laterality Date  . TONSILLECTOMY    . 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 Right handed.    Caffeine- 4 cups daily.     Objective: Vital Signs: BP 124/76   Pulse 78   Ht 5\' 5"  (1.651 m)   Wt 183 lb (83 kg)   BMI 30.45 kg/m    Physical Exam  Constitutional: She is oriented to person, place, and time. She appears well-developed and well-nourished.  HENT:  Head: Normocephalic and atraumatic.  Eyes: EOM are normal. Pupils are equal, round, and reactive to light.  Cardiovascular: Normal rate,  regular rhythm and normal heart sounds.  Exam reveals no gallop and no friction rub.   No murmur heard. Pulmonary/Chest: Effort normal and breath sounds normal. She has no wheezes. She has no rales.  Abdominal: Soft. Bowel sounds are normal. She exhibits no distension. There is no tenderness. There is no guarding. No hernia.  Musculoskeletal: Normal range of motion. She exhibits no edema, tenderness or deformity.  Lymphadenopathy:    She has no cervical adenopathy.  Neurological: She is alert and oriented to person, place, and time. Coordination normal.  Skin: Skin is warm and dry. Capillary refill takes less than 2 seconds. No rash noted.  Psychiatric: She has a normal mood and affect. Her behavior is normal.     Musculoskeletal Exam:  Full range of motion of all joints Grip strength is equal and strong bilaterally Fiber myalgia tender points are 18 out of 18 positive  CDAI Exam: CDAI Homunculus Exam:   Joint Counts:  CDAI Tender Joint count: 0 CDAI Swollen Joint count: 0  Global Assessments:  Patient Global Assessment: 3 Provider Global Assessment: 3  CDAI Calculated Score: 6  No synovitis on exam 18 out of 18 fibromyalgia tender points  Investigation: No additional findings.  Hospital Outpatient Visit on 06/23/2016  Component Date Value Ref Range Status  . WBC 06/23/2016 6.9  4.0 - 10.5 K/uL Final  . RBC 06/23/2016 4.36  3.87 - 5.11 MIL/uL Final  . Hemoglobin 06/23/2016 12.9  12.0 - 15.0 g/dL Final  . HCT 06/25/2016 38.9  36.0 - 46.0 % Final  . MCV 06/23/2016 89.2  78.0 - 100.0 fL Final  . MCH 06/23/2016 29.6  26.0 - 34.0 pg Final  . MCHC 06/23/2016 33.2  30.0 - 36.0 g/dL Final  . RDW 06/25/2016 13.3  11.5 - 15.5 % Final  . Platelets 06/23/2016 240  150 - 400 K/uL Final  . Neutrophils Relative % 06/23/2016 58  % Final  . Neutro Abs 06/23/2016 4.0  1.7 - 7.7 K/uL Final  . Lymphocytes Relative 06/23/2016 34  % Final  . Lymphs Abs 06/23/2016 2.3  0.7 - 4.0 K/uL Final   . Monocytes Relative 06/23/2016 5  % Final  . Monocytes Absolute 06/23/2016 0.4  0.1 - 1.0 K/uL Final  . Eosinophils Relative 06/23/2016 2  % Final  . Eosinophils Absolute 06/23/2016 0.1  0.0 - 0.7 K/uL Final  . Basophils Relative 06/23/2016 1  % Final  . Basophils Absolute 06/23/2016 0.0  0.0 - 0.1 K/uL Final  . Sodium 06/23/2016 137  135 - 145 mmol/L Final  . Potassium 06/23/2016 3.7  3.5 - 5.1 mmol/L Final  . Chloride 06/23/2016 104  101 - 111 mmol/L Final  . CO2 06/23/2016 25  22 - 32 mmol/L Final  . Glucose, Bld 06/23/2016 129* 65 - 99 mg/dL Final  . BUN 06/25/2016 11  6 - 20 mg/dL Final  .  Creatinine, Ser 06/23/2016 0.78  0.44 - 1.00 mg/dL Final  . Calcium 67/59/1638 8.9  8.9 - 10.3 mg/dL Final  . Total Protein 06/23/2016 7.1  6.5 - 8.1 g/dL Final  . Albumin 46/65/9935 3.6  3.5 - 5.0 g/dL Final  . AST 70/17/7939 23  15 - 41 U/L Final  . ALT 06/23/2016 19  14 - 54 U/L Final  . Alkaline Phosphatase 06/23/2016 78  38 - 126 U/L Final  . Total Bilirubin 06/23/2016 0.4  0.3 - 1.2 mg/dL Final  . GFR calc non Af Amer 06/23/2016 >60  >60 mL/min Final  . GFR calc Af Amer 06/23/2016 >60  >60 mL/min Final   Comment: (NOTE) The eGFR has been calculated using the CKD EPI equation. This calculation has not been validated in all clinical situations. eGFR's persistently <60 mL/min signify possible Chronic Kidney Disease.   Eustaquio Boyden gap 06/23/2016 8  5 - 15 Final  Hospital Outpatient Visit on 05/12/2016  Component Date Value Ref Range Status  . WBC 05/12/2016 7.5  4.0 - 10.5 K/uL Final  . RBC 05/12/2016 4.21  3.87 - 5.11 MIL/uL Final  . Hemoglobin 05/12/2016 12.4  12.0 - 15.0 g/dL Final  . HCT 03/00/9233 37.4  36.0 - 46.0 % Final  . MCV 05/12/2016 88.8  78.0 - 100.0 fL Final  . MCH 05/12/2016 29.5  26.0 - 34.0 pg Final  . MCHC 05/12/2016 33.2  30.0 - 36.0 g/dL Final  . RDW 00/76/2263 13.7  11.5 - 15.5 % Final  . Platelets 05/12/2016 203  150 - 400 K/uL Final  . Neutrophils Relative %  05/12/2016 57  % Final  . Neutro Abs 05/12/2016 4.2  1.7 - 7.7 K/uL Final  . Lymphocytes Relative 05/12/2016 31  % Final  . Lymphs Abs 05/12/2016 2.3  0.7 - 4.0 K/uL Final  . Monocytes Relative 05/12/2016 9  % Final  . Monocytes Absolute 05/12/2016 0.7  0.1 - 1.0 K/uL Final  . Eosinophils Relative 05/12/2016 2  % Final  . Eosinophils Absolute 05/12/2016 0.2  0.0 - 0.7 K/uL Final  . Basophils Relative 05/12/2016 1  % Final  . Basophils Absolute 05/12/2016 0.1  0.0 - 0.1 K/uL Final  . Sodium 05/12/2016 135  135 - 145 mmol/L Final  . Potassium 05/12/2016 4.1  3.5 - 5.1 mmol/L Final  . Chloride 05/12/2016 103  101 - 111 mmol/L Final  . CO2 05/12/2016 23  22 - 32 mmol/L Final  . Glucose, Bld 05/12/2016 92  65 - 99 mg/dL Final  . BUN 33/54/5625 15  6 - 20 mg/dL Final  . Creatinine, Ser 05/12/2016 0.90  0.44 - 1.00 mg/dL Final  . Calcium 63/89/3734 9.0  8.9 - 10.3 mg/dL Final  . Total Protein 05/12/2016 6.6  6.5 - 8.1 g/dL Final  . Albumin 28/76/8115 3.6  3.5 - 5.0 g/dL Final  . AST 72/62/0355 32  15 - 41 U/L Final  . ALT 05/12/2016 21  14 - 54 U/L Final  . Alkaline Phosphatase 05/12/2016 70  38 - 126 U/L Final  . Total Bilirubin 05/12/2016 0.9  0.3 - 1.2 mg/dL Final  . GFR calc non Af Amer 05/12/2016 >60  >60 mL/min Final  . GFR calc Af Amer 05/12/2016 >60  >60 mL/min Final   Comment: (NOTE) The eGFR has been calculated using the CKD EPI equation. This calculation has not been validated in all clinical situations. eGFR's persistently <60 mL/min signify possible Chronic Kidney Disease.   Eustaquio Boyden  gap 05/12/2016 9  5 - 15 Final  Office Visit on 02/07/2016  Component Date Value Ref Range Status  . WBC 02/07/2016 7.9  3.8 - 10.8 K/uL Final  . RBC 02/07/2016 4.65  3.80 - 5.10 MIL/uL Final  . Hemoglobin 02/07/2016 13.8  12.0 - 15.0 g/dL Final  . HCT 02/07/2016 42.1  35.0 - 45.0 % Final  . MCV 02/07/2016 90.5  80.0 - 100.0 fL Final  . MCH 02/07/2016 29.7  27.0 - 33.0 pg Final  . MCHC  02/07/2016 32.8  32.0 - 36.0 g/dL Final  . RDW 02/07/2016 13.8  11.0 - 15.0 % Final  . Platelets 02/07/2016 226  140 - 400 K/uL Final  . MPV 02/07/2016 11.7  7.5 - 12.5 fL Final  . Neutro Abs 02/07/2016 3555  1,500 - 7,800 cells/uL Final  . Lymphs Abs 02/07/2016 3318  850 - 3,900 cells/uL Final  . Monocytes Absolute 02/07/2016 711  200 - 950 cells/uL Final  . Eosinophils Absolute 02/07/2016 237  15 - 500 cells/uL Final  . Basophils Absolute 02/07/2016 79  0 - 200 cells/uL Final  . Neutrophils Relative % 02/07/2016 45  % Final  . Lymphocytes Relative 02/07/2016 42  % Final  . Monocytes Relative 02/07/2016 9  % Final  . Eosinophils Relative 02/07/2016 3  % Final  . Basophils Relative 02/07/2016 1  % Final  . Smear Review 02/07/2016 Criteria for review not met   Final  . Sodium 02/07/2016 141  135 - 146 mmol/L Final  . Potassium 02/07/2016 4.0  3.5 - 5.3 mmol/L Final  . Chloride 02/07/2016 104  98 - 110 mmol/L Final  . CO2 02/07/2016 26  20 - 31 mmol/L Final  . Glucose, Bld 02/07/2016 81  70 - 99 mg/dL Final  . BUN 02/07/2016 7  7 - 25 mg/dL Final  . Creat 02/07/2016 0.73  0.50 - 1.05 mg/dL Final   Comment:   For patients > or = 58 years of age: The upper reference limit for Creatinine is approximately 13% higher for people identified as African-American.     . Total Bilirubin 02/07/2016 0.4  0.2 - 1.2 mg/dL Final  . Alkaline Phosphatase 02/07/2016 81  33 - 130 U/L Final  . AST 02/07/2016 21  10 - 35 U/L Final  . ALT 02/07/2016 16  6 - 29 U/L Final  . Total Protein 02/07/2016 7.0  6.1 - 8.1 g/dL Final  . Albumin 02/07/2016 4.3  3.6 - 5.1 g/dL Final  . Calcium 02/07/2016 9.4  8.6 - 10.4 mg/dL Final  . GFR, Est African American 02/07/2016 >89  >=60 mL/min Final  . GFR, Est Non African American 02/07/2016 >89  >=60 mL/min Final  . Anit Nuclear Antibody(ANA) 02/07/2016 POS* NEGATIVE Final  . LDH 02/07/2016 196  120 - 250 U/L Final  . Sed Rate 02/07/2016 20  0 - 30 mm/hr Final  .  Rhuematoid fact SerPl-aCnc 02/07/2016 <10  <=14 IU/mL Final   Comment:                            Interpretive Table                     Low Positive: 15 - 41 IU/mL                     High Positive:  >= 42 IU/mL    In addition to the  RF result, and clinical symptoms including joint  involvement, the 2010 ACR Classification Criteria for  scoring/diagnosing Rheumatoid Arthritis include the results of the  following tests:  CRP (50354), ESR (15010), and CCP (APCA) (65681).  www.rheumatology.org/practice/clinical/classification/ra/ra_2010.asp   . B burgdorferi IgG Abs (IB) 02/07/2016 Negative   Final  . Lyme Disease 18 kD IgG 02/07/2016 Non Reactive   Final  . Lyme Disease 23 kD IgG 02/07/2016 Non Reactive   Final  . Lyme Disease 28 kD IgG 02/07/2016 Non Reactive   Final  . Lyme Disease 30 kD IgG 02/07/2016 Non Reactive   Final  . Lyme Disease 39 kD IgG 02/07/2016 Non Reactive   Final  . Lyme Disease 41 kD IgG 02/07/2016 Non Reactive   Final  . Lyme Disease 45 kD IgG 02/07/2016 Non Reactive   Final  . Lyme Disease 58 kD IgG 02/07/2016 Non Reactive   Final  . Lyme Disease 66 kD IgG 02/07/2016 Non Reactive   Final  . Lyme Disease 93 kD IgG 02/07/2016 Non Reactive   Final   Comment:    ** Reference Range for each analyte: Negative or Non Reactive **    IgG Positive: Any five of the following ten bands: 18, 23, 28, 30,  39, 41, 45, 58, 66, 93 kDa.  IgG Negative: Any pattern that does not meet the IgG positive  criteria.   . B burgdorferi IgM Abs (IB) 02/07/2016 Negative   Final  . Lyme Disease 23 kD IgM 02/07/2016 Non Reactive   Final  . Lyme Disease 39 kD IgM 02/07/2016 Non Reactive   Final  . Lyme Disease 41 kD IgM 02/07/2016 Non Reactive   Final   Comment:    ** Reference Range for each analyte: Negative or Non Reactive **    IgM Positive: Any two of the following three bands: 23, 39, 41 kDa.  IgM Negative: Any pattern that does not meet the IgM positive  criteria. A positive IgM  test result alone is not recommended for use in determining active disease due to a high likelihood of a false-positive result in persons with illness greater than 1 month duration. The CDC recommends testing by the EIA antibody test, followed by the Immunoblot confirmatory test only when the EIA test is positive.   . ANA Pattern 1 02/07/2016 HOMOGENEOUS*  Final  . ANA Titer 1 02/07/2016 1:640* titer Final   Comment:           Reference Range           < 1:40      Negative             1:40-1:80 Low Antibody level           > 1:80      Elevated Antibody level      Imaging: No results found.  Speciality Comments: No specialty comments available.    Procedures:  No procedures performed Allergies: Patient has no known allergies.   Assessment / Plan:     Visit Diagnoses: Rheumatoid arthritis of multiple sites with negative rheumatoid factor (HCC)  Synovitis of hand  High risk medications (not anticoagulants) long-term use - 07/26/2016:==> remicade infusion; mtx 3/wk;  Fibromyalgia  Chronic fatigue  Primary insomnia   Plan: #1: Rheumatoid arthritis, seronegative. Patient was feeling very weak and tired and she knew was because of methotrexate (0.3 ML's every week). She discontinued the methotrexate approximately mid-October. Her husband was very sick and was hospitalized and needed a double bypass  surgery and patient needed to be available for him. Because of the above, she felt it best to discontinue the methotrexate. She is having a lot of joint pain including bilateral hands bilateral wrists bilateral feet. It includes redness swelling and pain. She used ice packs and Aspercreme with mild relief only. She realizes that she needs to go back on methotrexate and she'll know that she'll get the above symptoms under control. Am agreeable and I advised her that with Remicade we do need to use methotrexate. Patient is agreeable to restart now.  #2: High-risk prescription:  Remicade infusions. Methotrexate 0.3 miles per week (suspended mid-October till now and plans to restart now) Folic acid 1 mg every day  #3: Bilateral hand pain, bilateral wrist pain, bilateral feet pain due to inadequate response with monotherapy of Remicade only when patient on her own stop the methotrexate. Please see above for full details. Patient will restart the methotrexate.  #4: Labs are up-to-date normal and will be due again in February.  #5: History of fibromyalgia. Rates her discomfort as 2-3 on a scale of 0-10 Patient benefits from water aerobics and she wants to restart it when the weather gets better. She had stopped it at one time because the water was too cold and flaring up.  #6: Follow-up appointment with Dr. Estanislado Pandy in 5 months.  #7: CBC with differential, CMP with GFR in 2 months with Remicade infusions.  Orders: No orders of the defined types were placed in this encounter.  Meds ordered this encounter  Medications  . methotrexate 50 MG/2ML injection    Sig: Inject 0.3 mLs (7.5 mg total) into the skin once a week.    Dispense:  4 mL    Refill:  0    Please provide 2 mL vials with preservatives. Please dispense enough vials for a 90 day supply    Order Specific Question:   Supervising Provider    Answer:   Bo Merino [[id number]]  . folic acid (FOLVITE) 1 MG tablet    Sig: Take 1 tablet (1 mg total) by mouth daily.    Dispense:  90 tablet    Refill:  4    Order Specific Question:   Supervising Provider    Answer:   Bo Merino [[id number]]  . diclofenac sodium (VOLTAREN) 1 % GEL    Sig: Apply 4 g topically 4 (four) times daily. Voltaren Gel 3 grams to 3 large joints upto TID 3 TUBES with 3 refills    Dispense:  3 Tube    Refill:  3    Voltaren Gel 3 grams to 3 large joints upto TID 3 TUBES with 3 refills    Order Specific Question:   Supervising Provider    Answer:   Bo Merino [phone]    Face-to-face time spent with patient was 30 minutes. 50%  of time was spent in counseling and coordination of care.  Follow-Up Instructions: Return in about 5 months (around 12/24/2016) for RA, Remicade, methotrexate 0.3 ML's, folic acid 1 mg, fibromyalgia, positive ANA, no SLE features,..   Boluwatife Mutchler, PA-C Pt has been having increased arthralgias but not much synovitis on exam. She was in agreement to restart MTX. I examined and evaluated the patient with Eliezer Lofts PA. The plan of care was discussed as noted above.  Bo Merino, MD Note - This record has been created using Editor, commissioning.  Chart creation errors have been sought, but may not always  have been located. Such creation errors do  not reflect on  the standard of medical care.

## 2016-07-26 NOTE — Addendum Note (Signed)
Addended byEliezer Lofts on: 07/26/2016 01:48 PM   Modules accepted: Orders

## 2016-07-28 ENCOUNTER — Other Ambulatory Visit: Payer: Self-pay | Admitting: Radiology

## 2016-07-31 ENCOUNTER — Encounter: Payer: Self-pay | Admitting: Podiatry

## 2016-07-31 ENCOUNTER — Ambulatory Visit (INDEPENDENT_AMBULATORY_CARE_PROVIDER_SITE_OTHER): Payer: 59

## 2016-07-31 ENCOUNTER — Ambulatory Visit (INDEPENDENT_AMBULATORY_CARE_PROVIDER_SITE_OTHER): Payer: 59 | Admitting: Podiatry

## 2016-07-31 VITALS — BP 138/86 | HR 91 | Resp 18

## 2016-07-31 DIAGNOSIS — M779 Enthesopathy, unspecified: Secondary | ICD-10-CM

## 2016-07-31 DIAGNOSIS — M2041 Other hammer toe(s) (acquired), right foot: Secondary | ICD-10-CM | POA: Diagnosis not present

## 2016-07-31 MED ORDER — DEXAMETHASONE SODIUM PHOSPHATE 120 MG/30ML IJ SOLN
4.0000 mg | Freq: Once | INTRAMUSCULAR | Status: AC
  Administered 2016-07-31: 4 mg via INTRA_ARTICULAR

## 2016-07-31 NOTE — Progress Notes (Signed)
   Subjective:    Patient ID: Courtney Myers, female    DOB: [birth year], 58 y.o.   MRN: [id number]  HPI  56 year old female presents the office today for concerns of pain to the also asked that the left foot she points the fifth metatarsal base which is been ongoing for about 1 week. She denies any recent injury or trauma. Denies any swelling or redness. She's had no recent treatment for this. She also has a hammertoe on her right fourth toe which occasionally causes discomfort. No recent treatment for this. No other complaints at this time. She denies any numbness or tingling to her feet.  Review of Systems  Musculoskeletal: Positive for gait problem.       Muscle and joint pain   Skin: Positive for rash.  Neurological: Positive for tremors.  All other systems reviewed and are negative.      Objective:   Physical Exam  General: AAO x3, NAD  Dermatological: Skin is warm, dry and supple bilateral. Nails x 10 are well manicured; remaining integument appears unremarkable at this time. There are no open sores, no preulcerative lesions, no rash or signs of infection present.  Vascular: Dorsalis Pedis artery and Posterior Tibial artery pedal pulses are 2/4 bilateral with immedate capillary fill time.  There is no pain with calf compression, swelling, warmth, erythema.   Neruologic: Grossly intact via light touch bilateral. Vibratory intact via tuning fork bilateral. Protective threshold with Semmes Wienstein monofilament intact to all pedal sites bilateral.  Musculoskeletal: There is tenderness palpation to left fifth metatarsal base and along the distal portion the peroneal tendon on the insertion. There is no significant edema. There is no overlying erythema. There is no Pelham course the peroneal tendons otherwise. Semirigid hammertoe contracture the right fourth toe. No other areas of tenderness bilaterally. Muscular strength 5/5 in all groups tested bilateral.  Gait: Unassisted,  Nonantalgic.      Assessment & Plan:  56 year old female left peroneal tendinitis/fifth metatarsal base pain; hammertoe right fourth toe -Treatment options discussed including all alternatives, risks, and complications -Etiology of symptoms were discussed -X-rays were obtained and reviewed with the patient. No evidence of acute fracture identified -I discussed a steroid injection the left foot along the area of maximal tenderness. Understanding risks and complications she was to proceed. Under sterile conditions a mixture of dexamethasone and local anesthetic was infiltrated without complications. Post injection care was discussed. Plantar pressure was applied from medial to lateral. Ice to the area as well. -Offloading pad for the hammertoe.  Celesta Gentile, DPM

## 2016-07-31 NOTE — Patient Instructions (Signed)
Start in about 1-2 weeks  Peroneal Tendinopathy Peroneal tendinopathy is irritation of the tendons that pass behind your ankle (peroneal tendons). These tendons attach muscles in your foot to a bone on the side of your foot and underneath the arch of your foot. This condition can cause your peroneal tendons to get bigger and swell. What are the causes? This condition may be caused by:  Putting stress on your ankle over and over again (overuse injury).  A sudden injury that puts stress on your tendons, such as an ankle sprain. What increases the risk? This condition is more likely to develop in:  People who have high arches.  Athletes who play sports that involve putting stress on the ankle over and over again. These sports include:  Running.  Dancing.  Soccer.  Basketball. What are the signs or symptoms? Symptoms of this condition can start suddenly or develop gradually. Symptoms include:  Pain in the back of the ankle, on the side of the foot, or in the arch of the foot.  Pain that gets worse with activity and better with rest.  Swelling.  Warmth.  Weakness in your foot or ankle. How is this diagnosed? This condition may be diagnosed based on:  Your symptoms.  Your medical history.  A physical exam.  Imaging tests, such as:  An X-ray or CT scan to check for bone injury.  MRI or ultrasound to check for muscle or tendon injury. During your physical exam, your health care provider may move your foot and ankle and test the strength of your leg muscles. How is this treated? This condition may be treated by:  Keeping your body weight off your ankle for several days.  Returning gradually to full activity gradually.  Putting ice on your ankle to reduce swelling.  Taking an anti-inflammatory pain medicine (NSAID).  Having medicine injected into your tendon to reduce swelling.  Wearing a removable boot or brace for ankle support.  Doing range-of-motion  exercises and strengthening exercises (physical therapy) when pain and swelling improve. If the condition does not improve with treatment, or if a tendon or muscle is damaged, surgery may be needed. Follow these instructions at home: If you have a boot or brace:  Wear it as told by your health care provider. Remove it only as told by your health care provider.  Loosen it if your toes tingle, become numb, or turn cold and blue.  Do not let it get wet if it is not waterproof.  Keep it clean. Managing pain, stiffness, and swelling  If directed, apply ice to the injured area:  Put ice in a plastic bag.  Place a towel between your skin and the bag.  Leave the ice on for 20 minutes, 2-3 times a day.  Take over-the-counter and prescription medicines only as told by your health care provider.  Raise (elevate) your ankle above the level of your heart when resting if you have swelling. Activity  Do not use your ankle to support (bear) your full body weight until your health care provider says that you can.  Do not do activities that make pain or swelling worse.  Return to your normal activities as told by your health care provider. General instructions  Keep all follow-up visits as told by your health care provider. This is important. How is this prevented?  Wear supportive footwear that is appropriate for your athletic activity.  Avoid athletic activities that cause swelling or pain in your ankle or foot.  See your health care provider if you have pain or swelling that does not improve after a few days of rest.  Stop training if you develop pain or swelling.  If you start a new athletic activity, start gradually to build up your strength, endurance, and flexibility. Contact a health care provider if:  Your symptoms get worse.  Your symptoms do not improve in 2-4 weeks.  You develop new, unexplained symptoms. This information is not intended to replace advice given to you  by your health care provider. Make sure you discuss any questions you have with your health care provider. Document Released: 06/19/2005 Document Revised: 02/22/2016 Document Reviewed: 05/08/2015 Elsevier Interactive Patient Education  2017 [employer].

## 2016-08-11 ENCOUNTER — Ambulatory Visit (HOSPITAL_COMMUNITY)
Admission: RE | Admit: 2016-08-11 | Discharge: 2016-08-11 | Disposition: A | Discharge status: Home / Self Care | Payer: 59 | Source: Ambulatory Visit | Attending: Rheumatology | Admitting: Rheumatology

## 2016-08-11 DIAGNOSIS — M0609 Rheumatoid arthritis without rheumatoid factor, multiple sites: Secondary | ICD-10-CM | POA: Diagnosis not present

## 2016-08-11 MED ORDER — ACETAMINOPHEN 325 MG PO TABS: 650.0000 mg | ORAL_TABLET | Freq: Once | ORAL | Status: DC

## 2016-08-11 MED ORDER — DIPHENHYDRAMINE HCL 25 MG PO CAPS: 25.0000 mg | ORAL_CAPSULE | Freq: Once | ORAL | Status: DC

## 2016-08-11 MED ORDER — SODIUM CHLORIDE 0.9 % IV SOLN
INTRAVENOUS | Status: DC
  Administered 2016-08-11: 09:00:00 via INTRAVENOUS

## 2016-08-11 MED ORDER — INFLIXIMAB 100 MG IV SOLR
500.0000 mg | INTRAVENOUS | Status: DC
  Administered 2016-08-11: 500 mg via INTRAVENOUS
  Filled 2016-08-11: qty 50

## 2016-08-12 ENCOUNTER — Other Ambulatory Visit: Payer: Self-pay | Admitting: Family Medicine

## 2016-08-12 DIAGNOSIS — K219 Gastro-esophageal reflux disease without esophagitis: Secondary | ICD-10-CM

## 2016-08-16 ENCOUNTER — Encounter: Payer: Self-pay | Admitting: Physician Assistant

## 2016-08-16 ENCOUNTER — Ambulatory Visit (INDEPENDENT_AMBULATORY_CARE_PROVIDER_SITE_OTHER): Payer: 59 | Admitting: Physician Assistant

## 2016-08-16 VITALS — BP 110/80 | HR 98 | Temp 98.1°F | Resp 18 | Wt 185.2 lb

## 2016-08-16 DIAGNOSIS — J988 Other specified respiratory disorders: Secondary | ICD-10-CM | POA: Diagnosis not present

## 2016-08-16 DIAGNOSIS — B9689 Other specified bacterial agents as the cause of diseases classified elsewhere: Principal | ICD-10-CM

## 2016-08-16 MED ORDER — HYDROCODONE-HOMATROPINE 5-1.5 MG/5ML PO SYRP: 5.0000 mL | ORAL_SOLUTION | Freq: Three times a day (TID) | ORAL | 0 refills | Status: DC | PRN

## 2016-08-16 MED ORDER — AZITHROMYCIN 250 MG PO TABS: ORAL_TABLET | ORAL | 0 refills | Status: DC

## 2016-08-16 NOTE — Progress Notes (Signed)
Patient ID: Courtney Myers MRN: [id number], DOB: 12-28-1960, 56 y.o. Date of Encounter: 08/16/2016, 9:33 AM    Chief Complaint:  Chief Complaint  Patient presents with  . Cough    x6 days  . Fatigue     HPI: 56 y.o. year old female presents with above.   Says that these symptoms started about 6 days ago. Says that actually it did start with some congestion in her head and nose but now is all in her chest.  "Has a real hard, deep cough". "Just keeps feeling worse every day". Has had no fever. Says that when she talks she "sounds nasally"  but she is blowing no mucus out of her nose. Has had no sinus pain. No sore throat.     Home Meds:   Outpatient Medications Prior to Visit  Medication Sig Dispense Refill  . AbobotulinumtoxinA (DYSPORT) 500 UNITS SOLR injection Inject 500 Units into the muscle See admin instructions. Take every three months per patient. Last dose was sometime in January per patient    . acetaminophen (TYLENOL) 325 MG tablet Take 650 mg by mouth once. One hour prior to infusion    . B-D TB SYRINGE 1CC/27GX1/2" 27G X 1/2" 1 ML MISC   1  . baclofen (LIORESAL) 10 MG tablet Take 10 mg by mouth daily as needed for muscle spasms.     . clonazePAM (KLONOPIN) 0.5 MG tablet TAKE 1 TABLET BY MOUTH 2 TIMES DAILY AS NEEDED FOR ANXIETY 60 tablet 5  . diclofenac sodium (VOLTAREN) 1 % GEL Apply 4 g topically 4 (four) times daily. Voltaren Gel 3 grams to 3 large joints upto TID 3 TUBES with 3 refills 3 Tube 3  . diphenhydrAMINE (BENADRYL) 25 mg capsule Take 25 mg by mouth once. One hour prior to infusion    . DULoxetine (CYMBALTA) 60 MG capsule Take 60 mg by mouth daily.    . folic acid (FOLVITE) 1 MG tablet Take 1 mg by mouth 2 (two) times daily.     . folic acid (FOLVITE) 1 MG tablet Take 1 tablet (1 mg total) by mouth daily. 90 tablet 4  . ibuprofen (ADVIL,MOTRIN) 600 MG tablet Take 1 tablet (600 mg total) by mouth every 6 (six) hours as needed. 30 tablet 0  . inFLIXimab  (REMICADE) 100 MG injection Inject 6 mg/kg into the vein every 6 (six) weeks. Infuse 500 mg/ 5 vials in 214mL NS over 2hours every 6 weeks    . methotrexate 50 MG/2ML injection Inject 0.3 mLs (7.5 mg total) into the skin once a week. 4 mL 0  . Multiple Vitamins-Minerals (MULTIVITAMINS THER. W/MINERALS) TABS Take 1 tablet by mouth daily.    . Omega-3 Fatty Acids (FISH OIL PO) Take 1,500 mg by mouth.    Marland Kitchen omeprazole (PRILOSEC) 20 MG capsule take 1 capsule by mouth once daily 30 capsule 11  . traMADol (ULTRAM) 50 MG tablet Take 1 tablet (50 mg total) by mouth every 6 (six) hours as needed. For pain 20 tablet 0  . Xylitol (XYLIMELTS MT) Use as directed in the mouth or throat.     No facility-administered medications prior to visit.     Allergies: No Known Allergies    Review of Systems: See HPI for pertinent ROS. All other ROS negative.    Physical Exam: Blood pressure 110/80, pulse 98, temperature 98.1 F (36.7 C), temperature source Oral, resp. rate 18, weight 185 lb 3.2 oz (84 kg), SpO2 98 %., Body mass  index is 30.82 kg/m. General:  WNWD WF. Appears in no acute distress. HEENT: Normocephalic, atraumatic, eyes without discharge, sclera non-icteric, nares are without discharge. Bilateral auditory canals clear, TM's are without perforation, pearly grey and translucent with reflective cone of light bilaterally. Oral cavity moist, posterior pharynx without exudate, erythema, peritonsillar abscess. No tenderness with percussion to frontal or maxillary sinuses bilaterally.  Neck: Supple. No thyromegaly. No lymphadenopathy. Lungs: Clear bilaterally to auscultation without wheezes, rales, or rhonchi. Breathing is unlabored. Heart: Regular rhythm. No murmurs, rubs, or gallops. Msk:  Strength and tone normal for age. Extremities/Skin: Warm and dry.  Neuro: Alert and oriented X 3. Moves all extremities spontaneously. Gait is normal. CNII-XII grossly in tact. Psych:  Responds to questions  appropriately with a normal affect.     ASSESSMENT AND PLAN:  56 y.o. year old female with  1. Bacterial respiratory infection She is to take the antibiotic as directed. Recommend Mucinex DM as expectorant. Can use the Hycodan as cough suppressant especially prior to going to sleep. Follow-up if symptoms do not resolve within 1 week after completion of antibiotic. - azithromycin (ZITHROMAX) 250 MG tablet; Day 1: take 2 daily. Days 2 -5: Take 1 daily.  Dispense: 6 tablet; Refill: 0 - HYDROcodone-homatropine (HYCODAN) 5-1.5 MG/5ML syrup; Take 5 mLs by mouth every 8 (eight) hours as needed for cough.  Dispense: 120 mL; Refill: 0   Signed, [address] [place], [place], [facility] 08/16/2016 9:33 AM

## 2016-08-21 ENCOUNTER — Ambulatory Visit: Payer: 59 | Admitting: Podiatry

## 2016-09-07 DIAGNOSIS — G243 Spasmodic torticollis: Secondary | ICD-10-CM | POA: Diagnosis not present

## 2016-09-13 ENCOUNTER — Telehealth: Payer: Self-pay | Admitting: Neurology

## 2016-09-13 ENCOUNTER — Encounter: Payer: Self-pay | Admitting: Neurology

## 2016-09-13 ENCOUNTER — Encounter: Payer: Self-pay | Admitting: Podiatry

## 2016-09-13 ENCOUNTER — Ambulatory Visit (INDEPENDENT_AMBULATORY_CARE_PROVIDER_SITE_OTHER): Payer: 59 | Admitting: Neurology

## 2016-09-13 ENCOUNTER — Ambulatory Visit (INDEPENDENT_AMBULATORY_CARE_PROVIDER_SITE_OTHER): Payer: 59 | Admitting: Podiatry

## 2016-09-13 VITALS — BP 114/75 | HR 92 | Ht 65.0 in | Wt 182.0 lb

## 2016-09-13 DIAGNOSIS — M779 Enthesopathy, unspecified: Secondary | ICD-10-CM | POA: Diagnosis not present

## 2016-09-13 DIAGNOSIS — G243 Spasmodic torticollis: Secondary | ICD-10-CM | POA: Diagnosis not present

## 2016-09-13 DIAGNOSIS — M216X9 Other acquired deformities of unspecified foot: Secondary | ICD-10-CM | POA: Diagnosis not present

## 2016-09-13 NOTE — Progress Notes (Signed)
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  Symptom onset was without apparent triggering event, she has gradually developed this neck pulling,  manifested primarily with right laterocollis, mild retrocolis, and left shoulder elevation since 2000. She has good relief with regular Botox injection, about 4 times a year since 2003. Last injection was February 18, 2010 by Dr. Marta Antu,  She reported great relief as usual, taking 4- 5 days to get symptom relieve, lasting about 3 months.  I began to inject her since 12.2011, every 3-4 months, dosage has been limited to 150 units of BOTOX A.  She denies neck pain, gait difficulty, but with wearing off Botox, she noticed increased head tremor, and pulling towards her right shoulder.  She developed  transient difficulty lifting her neck up when using BOTOX A 200 units previously, reponding well to decreased dose of 150 units  She was enrolled into IPSEN dysport study, first injection was in April 30th 2013, she later enrolled into open label, 2nd injection was in  May 28th 2013. She did very well.  Recent few weeks,  She had experienced flareup of her rheumatoid arthritis, has received IV infusion. Last injection was in 06/10/2012,  500 units of dysport was dissolved into 2 cc of normal saline. Right longissimus capitus 0.5 cc Right splenius capitis 0.5 cc Right levator scapular 0.5 cc Right iliocostalis 0.5 cc   Dysport research study has completed, she is now coming back for repeat injection, she noticed head bobbing over the past few weeks, she denies significant neck pain, weakness,  Her rheumatoid arthritis is under better control   Last injection was in Sep 2014, she did very well, no neck extension weakness, only rarely neck shaking  UPDATE March 8th 2016: She has lost follow-up since her last visit December 2014,  She came in for treatment her cervical dystonia, she feel the tension of her neck all the time, stiffness. Denied gait difficulty, last injection was September 2014, she received 500 units of Dysport, works well for her, she is continue on medication for anxiety, rheumatoid arthritis She also complains of nighttime snoring, frequent awakening, excessive daytime sleepiness, fatigue, today's ESS is 6, FSS was 72,  UPDATE October 13 2014:   She has missed her scheduled sleep study, continue have worsening neck posturing, posterior neck pain, excessive daytime fatigue and sleepiness  UPDATE Nov 11 2014: She came in for EMG guided Dysport injection today, last injection was December 2014, she complains of worsening head titubation, bilateral hands mild postural tremor, posterior neck pain,  Update February 17 2015 She responded very well to last EMG guided Dysport injection Nov 11 2014, no significant head titubation, no significant neck pain  Update July 22 2015: Last injection was August, now she noticed returning of neck pulling and shaking, posterior neck muscle achy pain  Update October 21 2015: Last EMG guided Dysport injection was in January, she responded very well, only recent couple weeks, she noticed returning of neck pulling, shaking again, She has worsening rheumatoid arthritis, is receiving more frequent treatment, complains fatigue, diffuse body achy pain  Update March 15 2016: She responded very well to previous Dysport injection in April, no significant side effect noticed, only recent few weeks, 5 months from previous injection, she noticed return of head shaking neck muscle tightness.  UPDATE Dec 14th 2017: She responded very well  to previous injection in Sept 2017, no significant side effect noticed.  Update September 13 2016: She did well this last injection in December  REVIEW OF SYSTEMS: Full 14 system review of systems performed and notable only for as above    ALLERGIES: No Known Allergies  HOME MEDICATIONS: Current Outpatient Prescriptions  Medication Sig Dispense Refill  . AbobotulinumtoxinA (DYSPORT) 500 UNITS SOLR injection Inject 500 Units into the muscle See admin instructions. Take every three months per patient. Last dose was sometime in January per patient    . acetaminophen (TYLENOL) 325 MG tablet Take 650 mg by mouth once. One hour prior to infusion    . B-D TB SYRINGE 1CC/27GX1/2" 27G X 1/2" 1 ML MISC   1  . baclofen (LIORESAL) 10 MG tablet Take 10 mg by mouth daily as needed for muscle spasms.     . clonazePAM (KLONOPIN) 0.5 MG tablet TAKE 1 TABLET BY MOUTH 2 TIMES DAILY AS NEEDED FOR ANXIETY 60 tablet 5  . diclofenac sodium (VOLTAREN) 1 % GEL Apply 4 g topically 4 (four) times daily. Voltaren Gel 3 grams to 3 large joints upto TID 3 TUBES with 3 refills 3 Tube 3  . diphenhydrAMINE (BENADRYL) 25 mg capsule Take 25 mg by mouth once. One hour prior to infusion    . DULoxetine (CYMBALTA) 60 MG capsule Take 60 mg by mouth daily.    . folic acid (FOLVITE) 1 MG tablet Take 1 tablet (1 mg total) by mouth daily. 90 tablet 4  . ibuprofen (ADVIL,MOTRIN) 600 MG tablet Take 1 tablet (600 mg total) by mouth every 6 (six) hours as needed. 30 tablet 0  . inFLIXimab (REMICADE) 100 MG injection Inject 6 mg/kg into the vein every 6 (six) weeks. Infuse 500 mg/ 5 vials in 2102mL NS over 2hours every 6 weeks    . methotrexate 50 MG/2ML injection Inject 0.3 mLs (7.5 mg total) into the skin once a week. 4 mL 0  . Multiple Vitamins-Minerals (MULTIVITAMINS THER. W/MINERALS) TABS Take 1 tablet by mouth daily.    . Omega-3 Fatty Acids (FISH OIL PO) Take 1,500 mg by mouth.    Marland Kitchen omeprazole (PRILOSEC) 20 MG capsule take 1 capsule by mouth once daily 30 capsule 11  . traMADol (ULTRAM) 50 MG tablet Take 1 tablet (50 mg total) by mouth every 6 (six) hours as needed. For pain 20 tablet 0  . Xylitol (XYLIMELTS MT) Use as directed in the mouth or throat.     No  current facility-administered medications for this visit.     PAST MEDICAL HISTORY: Past Medical History:  Diagnosis Date  . Anxiety   . Cervical dystonia   . Depression   . Fibromyalgia   . GERD (gastroesophageal reflux disease)   . History of shingles   . RA (rheumatoid arthritis) ([place])   . Synovitis of hand     PAST SURGICAL HISTORY: Past Surgical History:  Procedure Laterality Date  . TONSILLECTOMY    . VAGINAL HYSTERECTOMY  3/08    FAMILY HISTORY: Family History  Problem Relation Age of Onset  . Arthritis Mother   . Heart Problems Father   . Heart disease Father   . Heart disease Paternal Uncle     MI    SOCIAL HISTORY:  Social History   Social History  . Marital status: Married    Spouse name: Courtney Myers  . Number of children: 2  . Years of education: 12   Occupational History  .  [employer]  Social History Main Topics  . Smoking status: Former Smoker    Packs/day: 0.50    Years: 30.00    Types: Cigarettes    Quit date: 05/02/2015  . Smokeless tobacco: Never Used  . Alcohol use 0.0 oz/week     Comment: Rare  . Drug use: No  . Sexual activity: Yes    Birth control/ protection: Surgical     Comment: 1st intercourse 56 yo-Fewer than 5 partners   Other Topics Concern  . 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 Right handed.    Caffeine- 4 cups daily.     PHYSICAL EXAM   Vitals:   09/13/16 1319  BP: 114/75  Pulse: 92  Weight: 182 lb (82.6 kg)  Height: 5\' 5"  (1.651 m)    Not recorded      Body mass index is 30.29 kg/m.  PHYSICAL EXAMNIATION: MENTAL STATUS: Speech is normal, normal to casual conversation, and history taking  CRANIAL NERVES: mild retrocollis, right tilt, mild right lateral shift, slight left shoulder elevation  MOTOR: Normal tone and bulk and strength COORDINATION: No dysmetria    GAIT/STANCE: Posture is normal.  DIAGNOSTIC DATA (LABS, IMAGING, TESTING) - I reviewed patient records, labs, notes, testing and imaging myself where available.  ASSESSMENT AND PLAN  IYLA BALZARINI is a 56 y.o. female with long-standing history of cervical dystonia, responded very well to previous EMG guided Dysport injection, last injection was May 2016, responded very well.   Mild retrocollis, right tilt, mild right lateral shift, slight left shoulder elevation  500 of Dysport was dissolved into 2.5 cc of normal saline,  Right longissimus capitus 0. 5 cc Right semispinalis 0.2 5 cc, Left semispinalis 0.25 cc Right inferior oblique capitis, 0.5 cc Left inferior oblique capitis 0.5 cc  5 Left levator scapular 0.5  Patient tolerate the injection well, will return to clinic in 3 months for repeat injection Please dissolve 500 units of Dysport into 2.5 cc of normal saline   Courtney Myers, M.D. Ph.D.  [facility] [address] [place], [place] [address] Ph: [phone] Fax: [phone]

## 2016-09-13 NOTE — Patient Instructions (Signed)
Peroneal Tendinopathy Rehab Ask your health care provider which exercises are safe for you. Do exercises exactly as told by your health care provider and adjust them as directed. It is normal to feel mild stretching, pulling, tightness, or discomfort as you do these exercises, but you should stop right away if you feel sudden pain or your pain gets worse.Do not begin these exercises until told by your health care provider. Stretching and range of motion exercises These exercises warm up your muscles and joints and improve the movement and flexibility of your ankle. These exercises also help to relieve pain and stiffness. Exercise A: Gastroc and soleus, standing  1. Stand on the edge of a step on the balls of your feet. The ball of your foot is on the walking surface, right under your toes. 2. Hold onto the railing for balance. 3. Slowly lift your left / right foot, allowing your body weight to press your left / right heel down over the edge of the step. You should feel a stretch in your left / right calf. 4. Hold this position for __________ seconds. Repeat __________ times with your left / right knee straight and __________ times with your left / right knee bent. Complete this stretch __________ times per day. Strengthening exercises These exercises improve the strength and endurance of your foot and ankle. Endurance is the ability to use your muscles for a long time, even after they get tired. Exercise B: Dorsiflexors   1. Secure a rubber exercise band or tube to an object, like a table leg, that will not move if it is pulled on. 2. Secure the other end of the band around your left / right foot. 3. Sit on the floor, facing the object with your left / right foot extended. The band or tube should be slightly tense when your foot is relaxed. 4. Slowly flex your left / right ankle and toes to bring your foot toward you. 5. Hold this position for __________ seconds. 6. Slowly return your foot to the  starting position. Repeat __________ times. Complete this exercise __________ times per day. Exercise C: Evertors  1. Sit on the floor with your legs straight out in front of you. 2. Loop a rubber exercise or band or tube around the ball of your left / right foot. The ball of your foot is on the walking surface, right under your toes. 3. Hold the ends of the band in your hands, or secure the band to a stable object. 4. Slowly push your foot outward, away from your other leg. 5. Hold this position for __________ seconds. 6. Slowly return your foot to the starting position. Repeat __________ times. Complete this exercise __________ times per day. Exercise D: Standing heel raise (  plantar flexion) 1. Stand with your feet shoulder-width apart with the balls of your feet on a step. The ball of your foot is on the walking surface, right under your toes. 2. Keep your weight spread evenly over the width of your feet while you rise up on your toes. Use a wall or railing to steady yourself, but try not to use it for support. 3. If this exercise is too easy, try these options:  Shift your weight toward your left / right leg until you feel challenged.  If told by your health care provider, stand on your left / right leg only. 4. Hold this position for __________ seconds. Repeat __________ times. Complete this exercise __________ times per day. Exercise E:  Single leg stand 1. Without shoes, stand near a railing or in a doorway. You may hold onto the railing or door frame as needed. 2. Stand on your left / right foot. Keep your big toe down on the floor and try to keep your arch lifted.  Do not roll to the outside of your foot.  If this exercise is too easy, you can try it with your eyes closed or while standing on a pillow. 3. Hold this position for __________ seconds. Repeat __________ times. Complete this exercise __________ times per day. This information is not intended to replace advice given  to you by your health care provider. Make sure you discuss any questions you have with your health care provider. Document Released: 06/19/2005 Document Revised: 02/24/2016 Document Reviewed: 05/08/2015 Elsevier Interactive Patient Education  2017 [employer].

## 2016-09-13 NOTE — Progress Notes (Signed)
**  Dysport 500 units x 1 vial, NDC [address]-1, Lot [id number], Exp 04/01/17, specialty pharmacy.//mck,[email or web address]**

## 2016-09-13 NOTE — Telephone Encounter (Signed)
emg guided dysport injection needed per Krista Blue.

## 2016-09-14 NOTE — Progress Notes (Signed)
Subjective: 56 year old female presents to the office today for follow-up of left foot pain, peroneal tendonitis. She states that she is doing better but she still gets pain to the outside aspect of the ankle, especially after standing for some time. No recent injury since last appointment. No increase in swelling.  Denies any systemic complaints such as fevers, chills, nausea, vomiting. No acute changes since last appointment, and no other complaints at this time.   Objective: AAO x3, NAD DP/PT pulses palpable bilaterally, CRT less than 3 seconds Continued but decreased tenderness to the lateral aspect of the left foot along the insertion of the peroneal tendons. There is no pain along the proximal tendon Peroneal tendons appear intact. There is mild tenderness to the 4th/5th metatarsal cuboid joint. No significant swelling. No other areas of tenderness present.  Cavus foot type.  No open lesions or pre-ulcerative lesions.  No pain with calf compression, swelling, warmth, erythema  Assessment: Continued peroneal tendonitis, cavus foot type  Plan: -All treatment options discussed with the patient including all alternatives, risks, complications.  -At this time will start rehab exercises for peroneal tendonitis. Will start at home but if symptoms continue discussed PT -Recommended orthotics and she states she would like to try them. Rick measured her for orthotics today.  They were sent to [facility].  -RTC in 3 weeks to PUO or sooner if needed. Discussed if symptoms continue, MRI.  -Patient encouraged to call the office with any questions, concerns, change in symptoms.   Celesta Gentile, DPM

## 2016-09-18 NOTE — Telephone Encounter (Signed)
I called to schedule the patient, she did not answer so I left a VM asking her to call me.

## 2016-09-20 ENCOUNTER — Other Ambulatory Visit: Payer: Self-pay | Admitting: Neurology

## 2016-09-22 ENCOUNTER — Encounter (HOSPITAL_COMMUNITY): Payer: 59

## 2016-09-29 ENCOUNTER — Ambulatory Visit (HOSPITAL_COMMUNITY)
Admission: RE | Admit: 2016-09-29 | Discharge: 2016-09-29 | Disposition: A | Discharge status: Home / Self Care | Payer: 59 | Source: Ambulatory Visit | Attending: Rheumatology | Admitting: Rheumatology

## 2016-09-29 DIAGNOSIS — M0609 Rheumatoid arthritis without rheumatoid factor, multiple sites: Secondary | ICD-10-CM | POA: Insufficient documentation

## 2016-09-29 MED ORDER — SODIUM CHLORIDE 0.9 % IV SOLN: INTRAVENOUS | Status: DC

## 2016-09-29 MED ORDER — SODIUM CHLORIDE 0.9 % IV SOLN
500.0000 mg | INTRAVENOUS | Status: DC
  Administered 2016-09-29: 500 mg via INTRAVENOUS
  Filled 2016-09-29 (×2): qty 50

## 2016-09-29 MED ORDER — ACETAMINOPHEN 325 MG PO TABS: 650.0000 mg | ORAL_TABLET | Freq: Once | ORAL | Status: DC

## 2016-09-29 MED ORDER — DIPHENHYDRAMINE HCL 25 MG PO CAPS: 25.0000 mg | ORAL_CAPSULE | Freq: Once | ORAL | Status: DC

## 2016-10-04 ENCOUNTER — Other Ambulatory Visit: Payer: 59

## 2016-10-17 ENCOUNTER — Other Ambulatory Visit: Payer: Self-pay | Admitting: Rheumatology

## 2016-10-17 ENCOUNTER — Telehealth: Payer: Self-pay | Admitting: Neurology

## 2016-10-17 NOTE — Telephone Encounter (Signed)
ok 

## 2016-10-17 NOTE — Telephone Encounter (Signed)
Pt called to schedule June appt for dysport. Please call

## 2016-10-17 NOTE — Telephone Encounter (Signed)
Last Visit: 07/26/16 Next Visit: 12/25/16  Okay to refill syringes?

## 2016-10-20 ENCOUNTER — Telehealth: Payer: Self-pay | Admitting: Radiology

## 2016-10-20 ENCOUNTER — Other Ambulatory Visit: Payer: Self-pay | Admitting: Radiology

## 2016-10-20 DIAGNOSIS — D849 Immunodeficiency, unspecified: Secondary | ICD-10-CM

## 2016-10-20 DIAGNOSIS — Z111 Encounter for screening for respiratory tuberculosis: Secondary | ICD-10-CM

## 2016-10-20 DIAGNOSIS — Z79899 Other long term (current) drug therapy: Secondary | ICD-10-CM

## 2016-10-20 DIAGNOSIS — M0609 Rheumatoid arthritis without rheumatoid factor, multiple sites: Secondary | ICD-10-CM

## 2016-10-20 DIAGNOSIS — D899 Disorder involving the immune mechanism, unspecified: Secondary | ICD-10-CM

## 2016-10-20 NOTE — Progress Notes (Signed)
TB gold due and labs past due with infusions  Have made sure orders are in system   Patient has not had labs since [place], she did infuse in [facility] there were orders but not drawn

## 2016-10-20 NOTE — Telephone Encounter (Signed)
TB gold due and labs past due with infusions  Have made sure orders are in system   Patient has not had labs since [place], she did infuse in [facility] there were orders but not drawn  Do we need to do these before she infuses in May ?

## 2016-10-23 NOTE — Telephone Encounter (Signed)
Yes she needs to have labs drawn

## 2016-10-23 NOTE — Telephone Encounter (Signed)
I have called patient to advise labs need to be drawn with infusions.   She will come by here for the labs this week

## 2016-10-26 ENCOUNTER — Other Ambulatory Visit: Payer: Self-pay | Admitting: Radiology

## 2016-10-26 NOTE — Progress Notes (Signed)
Orders active for Remicade tylenol Benadryl CBC CMP and TB gold

## 2016-10-27 ENCOUNTER — Other Ambulatory Visit: Payer: Self-pay | Admitting: Radiology

## 2016-10-27 DIAGNOSIS — Z9225 Personal history of immunosupression therapy: Secondary | ICD-10-CM

## 2016-10-27 DIAGNOSIS — Z79899 Other long term (current) drug therapy: Secondary | ICD-10-CM | POA: Diagnosis not present

## 2016-10-27 DIAGNOSIS — Z111 Encounter for screening for respiratory tuberculosis: Secondary | ICD-10-CM

## 2016-10-27 LAB — CBC WITH DIFFERENTIAL/PLATELET
Basophils Absolute: 76 cells/uL (ref 0–200)
Basophils Relative: 1 %
Eosinophils Absolute: 228 cells/uL (ref 15–500)
Eosinophils Relative: 3 %
HCT: 40.7 % (ref 35.0–45.0)
Hemoglobin: 13.7 g/dL (ref 11.7–15.5)
Lymphocytes Relative: 39 %
Lymphs Abs: 2964 cells/uL (ref 850–3900)
MCH: 30 pg (ref 27.0–33.0)
MCHC: 33.7 g/dL (ref 32.0–36.0)
MCV: 89.3 fL (ref 80.0–100.0)
MPV: 11.5 fL (ref 7.5–12.5)
Monocytes Absolute: 608 cells/uL (ref 200–950)
Monocytes Relative: 8 %
Neutro Abs: 3724 cells/uL (ref 1500–7800)
Neutrophils Relative %: 49 %
Platelets: 245 10*3/uL (ref 140–400)
RBC: 4.56 MIL/uL (ref 3.80–5.10)
RDW: 14.3 % (ref 11.0–15.0)
WBC: 7.6 10*3/uL (ref 3.8–10.8)

## 2016-10-28 LAB — COMPLETE METABOLIC PANEL WITH GFR
AG Ratio: 1.4 Ratio (ref 1.0–2.5)
ALT: 16 U/L (ref 6–29)
AST: 15 U/L (ref 10–35)
Albumin: 3.9 g/dL (ref 3.6–5.1)
Alkaline Phosphatase: 78 U/L (ref 33–130)
BUN/Creatinine Ratio: 15.8 Ratio (ref 6–22)
BUN: 12 mg/dL (ref 7–25)
CO2: 25 mmol/L (ref 20–31)
Calcium: 9 mg/dL (ref 8.6–10.4)
Chloride: 105 mmol/L (ref 98–110)
Creat: 0.76 mg/dL (ref 0.50–1.05)
GFR, Est African American: >89 mL/min (ref >=60)
GFR, Est Non African American: 87 mL/min (ref >=60)
Globulin: 2.8 g/dL (ref 1.9–3.7)
Glucose, Bld: 92 mg/dL (ref 65–99)
Potassium: 4.4 mmol/L (ref 3.5–5.3)
Sodium: 139 mmol/L (ref 135–146)
Total Bilirubin: 0.3 mg/dL (ref 0.2–1.2)
Total Protein: 6.7 g/dL (ref 6.1–8.1)

## 2016-10-30 LAB — QUANTIFERON TB GOLD ASSAY (BLOOD)
Interferon Gamma Release Assay: NEGATIVE
Mitogen-Nil: >10 IU/mL
Quantiferon Nil Value: 0.06 IU/mL
Quantiferon Tb Ag Minus Nil Value: 0.01 IU/mL

## 2016-10-30 NOTE — Progress Notes (Signed)
WNL

## 2016-11-07 ENCOUNTER — Other Ambulatory Visit (HOSPITAL_COMMUNITY): Payer: Self-pay | Admitting: *Deleted

## 2016-11-08 ENCOUNTER — Ambulatory Visit (HOSPITAL_COMMUNITY)
Admission: RE | Admit: 2016-11-08 | Discharge: 2016-11-08 | Disposition: A | Discharge status: Home / Self Care | Payer: 59 | Source: Ambulatory Visit | Attending: Rheumatology | Admitting: Rheumatology

## 2016-11-08 DIAGNOSIS — M0609 Rheumatoid arthritis without rheumatoid factor, multiple sites: Secondary | ICD-10-CM | POA: Diagnosis not present

## 2016-11-08 MED ORDER — ACETAMINOPHEN 325 MG PO TABS: 650.0000 mg | ORAL_TABLET | Freq: Once | ORAL | Status: DC

## 2016-11-08 MED ORDER — DIPHENHYDRAMINE HCL 25 MG PO CAPS: 25.0000 mg | ORAL_CAPSULE | Freq: Once | ORAL | Status: DC

## 2016-11-08 MED ORDER — SODIUM CHLORIDE 0.9 % IV SOLN
500.0000 mg | INTRAVENOUS | Status: DC
  Administered 2016-11-08: 500 mg via INTRAVENOUS
  Filled 2016-11-08: qty 50

## 2016-11-08 MED ORDER — SODIUM CHLORIDE 0.9 % IV SOLN
INTRAVENOUS | Status: DC
  Administered 2016-11-08: 08:00:00 via INTRAVENOUS

## 2016-11-08 MED ORDER — INFLIXIMAB 100 MG IV SOLR
500.0000 mg | INTRAVENOUS | Status: DC
  Filled 2016-11-08: qty 50

## 2016-11-09 ENCOUNTER — Telehealth: Payer: Self-pay

## 2016-11-09 NOTE — Telephone Encounter (Signed)
Submitted a pre-certification through [facility] online for Remicaide infusion for patient. Received an approval from 12/20/16 through 07/02/17 for 4 visits.   Authorization number: [id number]   Will send document to scan center.  Kyngston Pickelsimer, [place], CPhT 12:15 PM

## 2016-11-15 ENCOUNTER — Encounter: Payer: Self-pay | Admitting: Gynecology

## 2016-11-20 ENCOUNTER — Telehealth: Payer: Self-pay

## 2016-11-20 NOTE — Telephone Encounter (Signed)
Received the authorization letter in the mail from [facility]. Patient has been approved from 12/20/16-07/02/2017 for 4 visits. Patient will need 5 visits. Spoke to [employer], a representative, who was able to change that for her. The online portal was updated and printed off.   Will send copy to scan center.   Reference number: [id number]   Demetrios Loll, CPhT 8:56 AM

## 2016-11-30 ENCOUNTER — Other Ambulatory Visit: Payer: Self-pay | Admitting: *Deleted

## 2016-11-30 MED ORDER — ABOBOTULINUMTOXINA 500 UNITS IM SOLR: 500.0000 [IU] | INTRAMUSCULAR | 3 refills | Status: DC

## 2016-12-05 ENCOUNTER — Other Ambulatory Visit: Payer: Self-pay | Admitting: Radiology

## 2016-12-05 NOTE — Progress Notes (Signed)
Infusion orders are current for patient CBC CMP Tylenol Benadryl TB gold not due yet, appointments are up to date and follow up appointment t is scheduled 

## 2016-12-11 ENCOUNTER — Other Ambulatory Visit: Payer: Self-pay | Admitting: Rheumatology

## 2016-12-11 NOTE — Telephone Encounter (Signed)
Last Visit: 07/26/16 Next Visit: 12/25/16 Labs: 10/27/16 WNL  Okay to refill MTX?

## 2016-12-15 DIAGNOSIS — G243 Spasmodic torticollis: Secondary | ICD-10-CM | POA: Diagnosis not present

## 2016-12-19 ENCOUNTER — Other Ambulatory Visit (HOSPITAL_COMMUNITY): Payer: Self-pay | Admitting: *Deleted

## 2016-12-19 ENCOUNTER — Telehealth: Payer: Self-pay | Admitting: Neurology

## 2016-12-19 NOTE — Telephone Encounter (Signed)
Called and left patient a message relaying we need to schedule her Botox apt when Dr. Rhea Belton July 16 th at 1:00 . Patient does not to arrive sooner apt at 1.00 pm . Apt status has been dicussed with Sharyn Lull. Thanks Hinton Dyer

## 2016-12-19 NOTE — Telephone Encounter (Signed)
Once we get confirmation from the patient, we can double book the 1pm appt.

## 2016-12-20 ENCOUNTER — Ambulatory Visit (HOSPITAL_COMMUNITY)
Admission: RE | Admit: 2016-12-20 | Discharge: 2016-12-20 | Disposition: A | Discharge status: Home / Self Care | Payer: 59 | Source: Ambulatory Visit | Attending: Rheumatology | Admitting: Rheumatology

## 2016-12-20 DIAGNOSIS — M0609 Rheumatoid arthritis without rheumatoid factor, multiple sites: Secondary | ICD-10-CM | POA: Insufficient documentation

## 2016-12-20 LAB — COMPREHENSIVE METABOLIC PANEL
ALT: 16 U/L (ref 14–54)
AST: 48 U/L — ABNORMAL HIGH (ref 15–41)
Albumin: 3.8 g/dL (ref 3.5–5.0)
Alkaline Phosphatase: 74 U/L (ref 38–126)
Anion gap: 6 (ref 5–15)
BUN: 8 mg/dL (ref 6–20)
CO2: 24 mmol/L (ref 22–32)
Calcium: 9 mg/dL (ref 8.9–10.3)
Chloride: 105 mmol/L (ref 101–111)
Creatinine, Ser: 0.79 mg/dL (ref 0.44–1.00)
GFR calc Af Amer: >60 mL/min (ref >60)
GFR calc non Af Amer: >60 mL/min (ref >60)
Glucose, Bld: 101 mg/dL — ABNORMAL HIGH (ref 65–99)
Potassium: 5.6 mmol/L — ABNORMAL HIGH (ref 3.5–5.1)
Sodium: 135 mmol/L (ref 135–145)
Total Bilirubin: 1.3 mg/dL — ABNORMAL HIGH (ref 0.3–1.2)
Total Protein: 6.7 g/dL (ref 6.5–8.1)

## 2016-12-20 LAB — CBC
HCT: 40.4 % (ref 36.0–46.0)
Hemoglobin: 13.6 g/dL (ref 12.0–15.0)
MCH: 30.5 pg (ref 26.0–34.0)
MCHC: 33.7 g/dL (ref 30.0–36.0)
MCV: 90.6 fL (ref 78.0–100.0)
Platelets: 241 10*3/uL (ref 150–400)
RBC: 4.46 MIL/uL (ref 3.87–5.11)
RDW: 14.6 % (ref 11.5–15.5)
WBC: 11 10*3/uL — ABNORMAL HIGH (ref 4.0–10.5)

## 2016-12-20 MED ORDER — SODIUM CHLORIDE 0.9 % IV SOLN
INTRAVENOUS | Status: DC
  Administered 2016-12-20: 08:00:00 via INTRAVENOUS

## 2016-12-20 MED ORDER — SODIUM CHLORIDE 0.9 % IV SOLN
500.0000 mg | INTRAVENOUS | Status: DC
  Administered 2016-12-20: 500 mg via INTRAVENOUS
  Filled 2016-12-20: qty 50

## 2016-12-20 MED ORDER — DIPHENHYDRAMINE HCL 25 MG PO CAPS: 25.0000 mg | ORAL_CAPSULE | Freq: Once | ORAL | Status: DC

## 2016-12-20 MED ORDER — ACETAMINOPHEN 325 MG PO TABS: 650.0000 mg | ORAL_TABLET | Freq: Once | ORAL | Status: DC

## 2016-12-20 NOTE — Telephone Encounter (Signed)
Courtney Myers and Sharyn Lull I talked to [place] and she was fine for her apt to be July 16 th at 1:00 pm. Sharyn Lull can you double book please.

## 2016-12-20 NOTE — Telephone Encounter (Signed)
She has been added to the schedule

## 2016-12-20 NOTE — Progress Notes (Signed)
LABS ARE STABLE

## 2016-12-21 NOTE — Progress Notes (Signed)
Office Visit Note  Patient: Courtney Myers             Date of Birth: January 10, 1961           MRN: [id number]             PCP: Rennis Golden Referring: Orlena Sheldon, PA-C Visit Date: 12/25/2016 Occupation: @GUAROCC @    Subjective:  Medication Management   History of Present Illness: Courtney Myers is a 56 y.o. female with history of seronegative rheumatoid arthritis. She states she resumed methotrexate 0.3 mL subcutaneous every week. She states she has noticed improvement in her joint symptoms. The stiffness and joint pain has resolved right. She complains discomfort in her left CMC joint. She is right-handed. She has some discomfort in her left shoulder. She had a flare of fibromyalgia about 1-1/2 week ago. She is doing better now. She rates fatigue on 0-10 about 6.  Activities of Daily Living:  Patient reports morning stiffness for 15 minutes.   Patient Denies nocturnal pain.  Difficulty dressing/grooming: Denies Difficulty climbing stairs: Reports due to lower back and knee pain Difficulty getting out of chair: Reports due to lower back and knee pain Difficulty using hands for taps, buttons, cutlery, and/or writing: Denies   Review of Systems  Constitutional: Positive for fatigue. Negative for night sweats, weight gain, weight loss and weakness.  HENT: Positive for mouth dryness. Negative for mouth sores, trouble swallowing, trouble swallowing and nose dryness.   Eyes: Negative for pain, redness, visual disturbance and dryness.  Respiratory: Negative for cough, shortness of breath and difficulty breathing.   Cardiovascular: Negative for chest pain, palpitations, hypertension, irregular heartbeat and swelling in legs/feet.  Gastrointestinal: Negative for blood in stool, constipation and diarrhea.  Endocrine: Negative for increased urination.  Genitourinary: Negative for vaginal dryness.  Musculoskeletal: Positive for arthralgias, joint pain, myalgias, morning stiffness and  myalgias. Negative for joint swelling, muscle weakness and muscle tenderness.  Skin: Negative for color change, rash, hair loss, skin tightness, ulcers and sensitivity to sunlight.  Allergic/Immunologic: Negative for susceptible to infections.  Neurological: Negative for dizziness, memory loss and night sweats.  Hematological: Negative for swollen glands.  Psychiatric/Behavioral: Negative for depressed mood and sleep disturbance. The patient is nervous/anxious.     PMFS History:  Patient Active Problem List   Diagnosis Date Noted  . Excessive sleepiness 09/08/2014  . Fibromyalgia syndrome 08/25/2014  . RA (rheumatoid arthritis) ([place])   . GERD (gastroesophageal reflux disease)   . Anxiety   . Depression   . History of shingles   . Synovitis of hand   . Cervical dystonia 12/04/2012    Past Medical History:  Diagnosis Date  . Anxiety   . Cervical dystonia   . Depression   . Fibromyalgia   . GERD (gastroesophageal reflux disease)   . History of shingles   . RA (rheumatoid arthritis) ([place])   . Synovitis of hand     Family History  Problem Relation Age of Onset  . Arthritis Mother   . Heart Problems Father   . Heart disease Father   . Heart disease Paternal Uncle        MI   Past Surgical History:  Procedure Laterality Date  . TONSILLECTOMY    . 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 Right handed.  Caffeine- 4 cups daily.     Objective: Vital Signs: BP 122/74   Pulse 66   Resp 16   Ht 5\' 6"  (1.676 m)   Wt 180 lb (81.6 kg)   BMI 29.05 kg/m    Physical Exam  Constitutional: She is oriented to person, place, and time. She appears well-developed and well-nourished.  HENT:  Head: Normocephalic and atraumatic.  Eyes: Conjunctivae and EOM are normal.  Neck: Normal range of motion.  Cardiovascular: Normal  rate, regular rhythm, normal heart sounds and intact distal pulses.   Pulmonary/Chest: Effort normal and breath sounds normal.  Abdominal: Soft. Bowel sounds are normal.  Lymphadenopathy:    She has no cervical adenopathy.  Neurological: She is alert and oriented to person, place, and time.  Skin: Skin is warm and dry. Capillary refill takes less than 2 seconds.  Psychiatric: She has a normal mood and affect. Her behavior is normal.  Nursing note and vitals reviewed.    Musculoskeletal Exam: C-spine and thoracic lumbar spine good range of motion. Shoulder joints elbow joints wrist joint MCPs PIPs DIPs with good range of motion. She had no synovitis on examination. She had some tenderness over the left radial head and the left CMC. Hip joints knee joints ankles MTPs PIPs with good range of motion with no synovitis. Fibromyalgia tender points were 6 out of 18 positive.  CDAI Exam: CDAI Homunculus Exam:   Tenderness:  LUE: carpometacarpal  Joint Counts:  CDAI Tender Joint count: 0 CDAI Swollen Joint count: 0  Global Assessments:  Patient Global Assessment: 4 Provider Global Assessment: 4  CDAI Calculated Score: 8    Investigation: Findings:  TB gold negative 10/27/2016  07/20/2015 X-rays of the bilateral hands, 2 views today, showed right second and third MCP narrowing.  The rest of the joints are within normal limits without any joint space narrowing or erosive changes.   Bilateral feet x-rays showed bilateral PIP and DIP narrowing without any MTP changes.  There was no comparison available.   CBC Latest Ref Rng & Units 12/20/2016 10/27/2016 06/23/2016  WBC 4.0 - 10.5 K/uL 11.0(H) 7.6 6.9  Hemoglobin 12.0 - 15.0 g/dL 13.6 13.7 12.9  Hematocrit 36.0 - 46.0 % 40.4 40.7 38.9  Platelets 150 - 400 K/uL 241 245 240    CMP Latest Ref Rng & Units 12/20/2016 10/27/2016 06/23/2016  Glucose 65 - 99 mg/dL 101(H) 92 129(H)  BUN 6 - 20 mg/dL 8 12 11   Creatinine 0.44 - 1.00 mg/dL 0.79 0.76  0.78  Sodium 135 - 145 mmol/L 135 139 137  Potassium 3.5 - 5.1 mmol/L 5.6(H) 4.4 3.7  Chloride 101 - 111 mmol/L 105 105 104  CO2 22 - 32 mmol/L 24 25 25   Calcium 8.9 - 10.3 mg/dL 9.0 9.0 8.9  Total Protein 6.5 - 8.1 g/dL 6.7 6.7 7.1  Total Bilirubin 0.3 - 1.2 mg/dL 1.3(H) 0.3 0.4  Alkaline Phos 38 - 126 U/L 74 78 78  AST 15 - 41 U/L 48(H) 15 23  ALT 14 - 54 U/L 16 16 19     Imaging: No results found.  Speciality Comments: No specialty comments available.    Procedures:  No procedures performed Allergies: Patient has no known allergies.   Assessment / Plan:     Visit Diagnoses: Rheumatoid arthritis of multiple sites with negative rheumatoid factor ([place]): She does not have any synovitis on examination. She complains of some discomfort in her left wrist and CMC joint. I offered ultrasound examination to for  synovitis. Which she would like to hold off. She will try wearing her watch on her right wrist and see if it relieves her symptoms.  High risk medication use - Remicade 6mg /Kg q 6 wks and MTX 8.9FY sq qwk , Folic acid 1mg  po qd, elevation of AST. We will continue to monitor that closely. I've advised not to take any NSAIDs. She does not drink any alcohol.  Fibromyalgia syndrome: She had recent flare which she reports was quite severe she had to rest in bed for 2 days and also took baclofen.  History of depression and anxiety: She states her depression is quite well controlled but her anxiety is a still quite active.  History of gastroesophageal reflux (GERD)    Orders: No orders of the defined types were placed in this encounter.  No orders of the defined types were placed in this encounter.   Face-to-face time spent with patient was 30 minutes. 50% of time was spent in counseling and coordination of care.  Follow-Up Instructions: Return in about 4 months (around 04/26/2017) for Rheumatoid arthritis. Association of heart disease with rheumatoid arthritis was discussed.  Need to monitor blood pressure, cholesterol, and to exercise 30-60 minutes on daily basis was discussed. Poor dental hygiene can be a predisposing factor for rheumatoid arthritis. Good dental hygiene was discussed.  Bo Merino, MD  Note - This record has been created using Editor, commissioning.  Chart creation errors have been sought, but may not always  have been located. Such creation errors do not reflect on  the standard of medical care.

## 2016-12-25 ENCOUNTER — Encounter: Payer: Self-pay | Admitting: Rheumatology

## 2016-12-25 ENCOUNTER — Ambulatory Visit (INDEPENDENT_AMBULATORY_CARE_PROVIDER_SITE_OTHER): Payer: 59 | Admitting: Rheumatology

## 2016-12-25 VITALS — BP 122/74 | HR 66 | Resp 16 | Ht 66.0 in | Wt 180.0 lb

## 2016-12-25 DIAGNOSIS — M0609 Rheumatoid arthritis without rheumatoid factor, multiple sites: Secondary | ICD-10-CM | POA: Diagnosis not present

## 2016-12-25 DIAGNOSIS — Z79899 Other long term (current) drug therapy: Secondary | ICD-10-CM | POA: Diagnosis not present

## 2016-12-25 DIAGNOSIS — M797 Fibromyalgia: Secondary | ICD-10-CM

## 2016-12-25 DIAGNOSIS — Z8659 Personal history of other mental and behavioral disorders: Secondary | ICD-10-CM | POA: Diagnosis not present

## 2016-12-25 DIAGNOSIS — Z8719 Personal history of other diseases of the digestive system: Secondary | ICD-10-CM | POA: Diagnosis not present

## 2016-12-25 NOTE — Patient Instructions (Addendum)
Natural anti-inflammatories  You can purchase these at [employer], [employer] or online.  . Turmeric (capsules)  . Ginger (ginger root or capsules)  . Omega 3 (Fish, flax seeds, chia seeds, walnuts, almonds)  . Tart cherry (dried or extract)   Patient should be under the care of a physician while taking these supplements. This may not be reproduced without the permission of Dr. Bo Merino. Association of heart disease with rheumatoid arthritis was discussed. Need to monitor blood pressure, cholesterol, and to exercise 30-60 minutes on daily basis was discussed. Poor dental hygiene can be a predisposing factor for rheumatoid arthritis. Good dental hygiene was discussed.

## 2016-12-27 ENCOUNTER — Ambulatory Visit: Payer: 59 | Admitting: Neurology

## 2017-01-04 ENCOUNTER — Other Ambulatory Visit: Payer: Self-pay | Admitting: Rheumatology

## 2017-01-04 ENCOUNTER — Encounter: Payer: Self-pay | Admitting: Physician Assistant

## 2017-01-04 ENCOUNTER — Ambulatory Visit (INDEPENDENT_AMBULATORY_CARE_PROVIDER_SITE_OTHER): Payer: 59 | Admitting: Physician Assistant

## 2017-01-04 VITALS — BP 110/82 | HR 88 | Temp 97.6°F | Resp 16 | Ht 66.0 in | Wt 182.8 lb

## 2017-01-04 DIAGNOSIS — N390 Urinary tract infection, site not specified: Secondary | ICD-10-CM | POA: Diagnosis not present

## 2017-01-04 DIAGNOSIS — R319 Hematuria, unspecified: Secondary | ICD-10-CM | POA: Diagnosis not present

## 2017-01-04 DIAGNOSIS — R8290 Unspecified abnormal findings in urine: Secondary | ICD-10-CM | POA: Diagnosis not present

## 2017-01-04 LAB — URINALYSIS, ROUTINE W REFLEX MICROSCOPIC
Bilirubin Urine: NEGATIVE
Glucose, UA: NEGATIVE
Ketones, ur: NEGATIVE
Nitrite: NEGATIVE
Protein, ur: NEGATIVE
Specific Gravity, Urine: 1.01 (ref 1.001–1.035)
pH: 7 (ref 5.0–8.0)

## 2017-01-04 LAB — URINALYSIS, MICROSCOPIC ONLY
Casts: NONE SEEN [LPF]
Crystals: NONE SEEN [HPF]
Yeast: NONE SEEN [HPF]

## 2017-01-04 MED ORDER — CIPROFLOXACIN HCL 500 MG PO TABS: 500.0000 mg | ORAL_TABLET | Freq: Two times a day (BID) | ORAL | 0 refills | Status: DC

## 2017-01-04 NOTE — Progress Notes (Signed)
Patient ID: Courtney Myers MRN: [id number], DOB: [birth year], 58 y.o. Date of Encounter: 01/04/2017, 11:57 AM    Chief Complaint:  Chief Complaint  Patient presents with  . urine odor  . urination burning  . cloudy urine     HPI: 56 y.o. year old female states that on Sunday she noticed some that her urine had some odor and looked cloudy. Started increasing her water intake. Yesterday noticed some burning when she would urinate but was still able to be active with her plans for fourth of July. Continued to drink increased amount of water. However this morning felt increased amount of pain with urination so came in. States that she has had no fevers or chills. No back pain at costophrenic angle area.     Home Meds:   Outpatient Medications Prior to Visit  Medication Sig Dispense Refill  . AbobotulinumtoxinA (DYSPORT) 500 units SOLR injection Inject 500 Units into the muscle every 3 (three) months. 1 each 3  . acetaminophen (TYLENOL) 325 MG tablet Take 650 mg by mouth once. One hour prior to infusion    . B-D TB SYRINGE 1CC/27GX1/2" 27G X 1/2" 1 ML MISC use 1 TB syringe once weekly 4 each 11  . baclofen (LIORESAL) 10 MG tablet Take 10 mg by mouth daily as needed for muscle spasms.     . clonazePAM (KLONOPIN) 0.5 MG tablet take 1 tablet by mouth twice a day if needed for anxiety 60 tablet 5  . diclofenac sodium (VOLTAREN) 1 % GEL Apply 4 g topically 4 (four) times daily. Voltaren Gel 3 grams to 3 large joints upto TID 3 TUBES with 3 refills 3 Tube 3  . diphenhydrAMINE (BENADRYL) 25 mg capsule Take 25 mg by mouth once. One hour prior to infusion    . DULoxetine (CYMBALTA) 60 MG capsule take 1 capsule by mouth once daily 30 capsule 5  . folic acid (FOLVITE) 1 MG tablet Take 1 tablet (1 mg total) by mouth daily. 90 tablet 4  . inFLIXimab (REMICADE) 100 MG injection Inject 6 mg/kg into the vein every 6 (six) weeks. Infuse 500 mg/ 5 vials in 248mL NS over 2hours every 6 weeks    .  methotrexate 50 MG/2ML injection INJECT 0.3 MILLILITERS INTO SKIN ONCE A WEEK 4 mL 0  . Multiple Vitamins-Minerals (MULTIVITAMINS THER. W/MINERALS) TABS Take 1 tablet by mouth daily.    . Omega-3 Fatty Acids (FISH OIL PO) Take 1,500 mg by mouth.    Marland Kitchen omeprazole (PRILOSEC) 20 MG capsule take 1 capsule by mouth once daily 30 capsule 11  . Probiotic Product (PROBIOTIC ADVANCED PO) Take by mouth.    . traMADol (ULTRAM) 50 MG tablet Take 1 tablet (50 mg total) by mouth every 6 (six) hours as needed. For pain 20 tablet 0  . Xylitol (XYLIMELTS MT) Use as directed in the mouth or throat.    . methotrexate 250 MG/10ML injection Inject 7.5 mg into the skin once a week.   0   No facility-administered medications prior to visit.     Allergies: No Known Allergies    Review of Systems: See HPI for pertinent ROS. All other ROS negative.    Physical Exam: Blood pressure 110/82, pulse 88, temperature 97.6 F (36.4 C), temperature source Oral, resp. rate 16, height 5\' 6"  (1.676 m), weight 182 lb 12.8 oz (82.9 kg), SpO2 98 %., Body mass index is 29.5 kg/m. General:  WF. Appears in no acute distress. Neck: Supple. No thyromegaly.  No lymphadenopathy. Lungs: Clear bilaterally to auscultation without wheezes, rales, or rhonchi. Breathing is unlabored. Heart: Regular rhythm. No murmurs, rubs, or gallops. Abdomen: Soft, non-tender, non-distended with normoactive bowel sounds. No hepatomegaly. No rebound/guarding. No obvious abdominal masses. Msk:  Strength and tone normal for age. No tenderness with percussion to at area of costophrenic angle bilaterally. Extremities/Skin: Warm and dry. Neuro: Alert and oriented X 3. Moves all extremities spontaneously. Gait is normal. CNII-XII grossly in tact. Psych:  Responds to questions appropriately with a normal affect.   Results for orders placed or performed in visit on 01/04/17  Urinalysis, Routine w reflex microscopic  Result Value Ref Range   Color, Urine  YELLOW YELLOW   APPearance CLOUDY (A) CLEAR   Specific Gravity, Urine 1.010 1.001 - 1.035   pH 7.0 5.0 - 8.0   Glucose, UA NEGATIVE NEGATIVE   Bilirubin Urine NEGATIVE NEGATIVE   Ketones, ur NEGATIVE NEGATIVE   Hgb urine dipstick 2+ (A) NEGATIVE   Protein, ur NEGATIVE NEGATIVE   Nitrite NEGATIVE NEGATIVE   Leukocytes, UA 2+ (A) NEGATIVE  Urine Microscopic  Result Value Ref Range   WBC, UA  <=5 WBC/HPF   RBC / HPF  <=2 RBC/HPF   Squamous Epithelial / LPF  <=5 HPF   Bacteria, UA  NONE SEEN HPF   Crystals  NONE SEEN HPF   Casts  NONE SEEN LPF   Yeast  NONE SEEN HPF     ASSESSMENT AND PLAN:  56 y.o. year old female with  1. Urinary tract infection with hematuria, site unspecified I will send urine culture and follow-up those results. She is to take the Cipro as directed. Follow-up if symptoms do not resolve upon completion of antibiotic. - Urine Culture - ciprofloxacin (CIPRO) 500 MG tablet; Take 1 tablet (500 mg total) by mouth 2 (two) times daily.  Dispense: 14 tablet; Refill: 0  2. Abnormal urine odor  - Urinalysis, Routine w reflex microscopic   Signed, [address] [place], [place], [facility] 01/04/2017 11:57 AM

## 2017-01-04 NOTE — Telephone Encounter (Signed)
Last Visit: 12/25/16 Next Visit: 04/26/17  Okay to refill per Dr. Estanislado Pandy

## 2017-01-06 LAB — URINE CULTURE

## 2017-01-08 ENCOUNTER — Encounter: Payer: Self-pay | Admitting: Physician Assistant

## 2017-01-15 ENCOUNTER — Ambulatory Visit (INDEPENDENT_AMBULATORY_CARE_PROVIDER_SITE_OTHER): Payer: 59 | Admitting: Neurology

## 2017-01-15 ENCOUNTER — Encounter: Payer: Self-pay | Admitting: Neurology

## 2017-01-15 VITALS — BP 130/78 | HR 89 | Ht 66.0 in | Wt 183.5 lb

## 2017-01-15 DIAGNOSIS — G243 Spasmodic torticollis: Secondary | ICD-10-CM | POA: Diagnosis not present

## 2017-01-15 NOTE — Progress Notes (Signed)
Dysport 500mg  one vial.  LOT [id number] Exp 08/30/2017.  [place] [id number].

## 2017-01-15 NOTE — Progress Notes (Signed)
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  Symptom onset was without apparent triggering event, she has gradually developed this neck pulling,  manifested primarily with right laterocollis, mild retrocolis, and left shoulder elevation since 2000. She has good relief with regular Botox injection, about 4 times a year since 2003. Last injection was February 18, 2010 by Dr. Marta Antu,  She reported great relief as usual, taking 4- 5 days to get symptom relieve, lasting about 3 months.  I began to inject her since 12.2011, every 3-4 months, dosage has been limited to 150 units of BOTOX A.  She denies neck pain, gait difficulty, but with wearing off Botox, she noticed increased head tremor, and pulling towards her right shoulder.  She developed  transient difficulty lifting her neck up when using BOTOX A 200 units previously, reponding well to decreased dose of 150 units  She was enrolled into IPSEN dysport study, first injection was in April 30th 2013, she later enrolled into open label, 2nd injection was in  May 28th 2013. She did very well.  Recent few weeks,  She had experienced flareup of her rheumatoid arthritis, has received IV infusion. Last injection was in 06/10/2012,  500 units of dysport was dissolved into 2 cc of normal saline. Right longissimus capitus 0.5 cc Right splenius capitis 0.5 cc Right levator scapular 0.5 cc Right iliocostalis 0.5 cc   Dysport research study has completed, she is now coming back for repeat injection, she noticed head bobbing over the past few weeks, she denies significant neck pain, weakness,  Her rheumatoid arthritis is under better control   Last injection was in Sep 2014, she did very well, no neck extension weakness, only rarely neck shaking  UPDATE March 8th 2016: She has lost follow-up since her last visit December 2014,  She came in for treatment her cervical dystonia, she feel the tension of her neck all the time, stiffness. Denied gait difficulty, last injection was September 2014, she received 500 units of Dysport, works well for her, she is continue on medication for anxiety, rheumatoid arthritis She also complains of nighttime snoring, frequent awakening, excessive daytime sleepiness, fatigue, today's ESS is 6, FSS was 72,  UPDATE October 13 2014:   She has missed her scheduled sleep study, continue have worsening neck posturing, posterior neck pain, excessive daytime fatigue and sleepiness  UPDATE Nov 11 2014: She came in for EMG guided Dysport injection today, last injection was December 2014, she complains of worsening head titubation, bilateral hands mild postural tremor, posterior neck pain,  Update February 17 2015 She responded very well to last EMG guided Dysport injection Nov 11 2014, no significant head titubation, no significant neck pain  Update July 22 2015: Last injection was August, now she noticed returning of neck pulling and shaking, posterior neck muscle achy pain  Update October 21 2015: Last EMG guided Dysport injection was in January, she responded very well, only recent couple weeks, she noticed returning of neck pulling, shaking again, She has worsening rheumatoid arthritis, is receiving more frequent treatment, complains fatigue, diffuse body achy pain  Update March 15 2016: She responded very well to previous Dysport injection in April, no significant side effect noticed, only recent few weeks, 5 months from previous injection, she noticed return of head shaking neck muscle tightness.  UPDATE Dec 14th 2017: She responded very well  to previous injection in Sept 2017, no significant side effect noticed.  Update September 13 2016: She did well this last injection in December  Update January 15 2017: She noticed worsening head tremor, she had excessive stress, her sister passed away  from lung cancer in April 2018,  REVIEW OF SYSTEMS: Full 14 system review of systems performed and notable only for as above   ALLERGIES: No Known Allergies  HOME MEDICATIONS: Current Outpatient Prescriptions  Medication Sig Dispense Refill  . AbobotulinumtoxinA (DYSPORT) 500 units SOLR injection Inject 500 Units into the muscle every 3 (three) months. 1 each 3  . acetaminophen (TYLENOL) 325 MG tablet Take 650 mg by mouth once. One hour prior to infusion    . B-D TB SYRINGE 1CC/27GX1/2" 27G X 1/2" 1 ML MISC use 1 TB syringe once weekly 4 each 11  . baclofen (LIORESAL) 10 MG tablet Take 10 mg by mouth daily as needed for muscle spasms.     . clonazePAM (KLONOPIN) 0.5 MG tablet take 1 tablet by mouth twice a day if needed for anxiety 60 tablet 5  . diclofenac sodium (VOLTAREN) 1 % GEL Apply 4 g topically 4 (four) times daily. Voltaren Gel 3 grams to 3 large joints upto TID 3 TUBES with 3 refills 3 Tube 3  . diphenhydrAMINE (BENADRYL) 25 mg capsule Take 25 mg by mouth once. One hour prior to infusion    . DULoxetine (CYMBALTA) 60 MG capsule take 1 capsule by mouth once daily 30 capsule 5  . folic acid (FOLVITE) 1 MG tablet Take 1 tablet (1 mg total) by mouth daily. 90 tablet 4  . inFLIXimab (REMICADE) 100 MG injection Inject 6 mg/kg into the vein every 6 (six) weeks. Infuse 500 mg/ 5 vials in 220mL NS over 2hours every 6 weeks    . methotrexate 50 MG/2ML injection INJECT 0.3 MILLILITERS INTO SKIN ONCE A WEEK 4 mL 0  . Multiple Vitamins-Minerals (MULTIVITAMINS THER. W/MINERALS) TABS Take 1 tablet by mouth daily.    . Omega-3 Fatty Acids (FISH OIL PO) Take 1,500 mg by mouth.    Marland Kitchen omeprazole (PRILOSEC) 20 MG capsule take 1 capsule by mouth once daily 30 capsule 11  . Probiotic Product (PROBIOTIC ADVANCED PO) Take by mouth.    . traMADol (ULTRAM) 50 MG tablet Take 1 tablet (50 mg total) by mouth every 6 (six) hours as needed. For pain 20 tablet 0  . Xylitol (XYLIMELTS MT) Use as directed in the  mouth or throat.     No current facility-administered medications for this visit.     PAST MEDICAL HISTORY: Past Medical History:  Diagnosis Date  . Anxiety   . Cervical dystonia   . Depression   . Fibromyalgia   . GERD (gastroesophageal reflux disease)   . History of shingles   . RA (rheumatoid arthritis) ([place])   . Synovitis of hand     PAST SURGICAL HISTORY: Past Surgical History:  Procedure Laterality Date  . TONSILLECTOMY    . VAGINAL HYSTERECTOMY  3/08    FAMILY HISTORY: Family History  Problem Relation Age of Onset  . Arthritis Mother   . Heart Problems Father   . Heart disease Father   . Heart disease Paternal Uncle        MI    SOCIAL HISTORY:  Social History   Social History  . Marital status: Married    Spouse name: [name]  . Number of children: 2  . Years of education: 79  Occupational History  .  [place] History Main Topics  . Smoking status: Former Smoker    Packs/day: 0.50    Years: 30.00    Types: Cigarettes    Quit date: 05/02/2015  . Smokeless tobacco: Never Used  . Alcohol use 0.0 oz/week     Comment: Rare  . Drug use: No  . Sexual activity: Yes    Birth control/ protection: Surgical     Comment: 1st intercourse 56 yo-Fewer than 5 partners   Other Topics Concern  . 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 Right handed.    Caffeine- 4 cups daily.     PHYSICAL EXAM   Vitals:   01/15/17 1252  BP: 130/78  Pulse: 89  Weight: 183 lb 8 oz (83.2 kg)  Height: 5\' 6"  (1.676 m)    Not recorded      Body mass index is 29.62 kg/m.  PHYSICAL EXAMNIATION: MENTAL STATUS: Speech is normal, normal to casual conversation, and history taking  CRANIAL NERVES: mild retrocollis, right tilt, mild right lateral shift, slight left shoulder elevation  MOTOR: Normal tone and bulk and  strength COORDINATION: No dysmetria   GAIT/STANCE: Posture is normal.  DIAGNOSTIC DATA (LABS, IMAGING, TESTING) - I reviewed patient records, labs, notes, testing and imaging myself where available.  ASSESSMENT AND PLAN  CARLENA RUYBAL is a 56 y.o. female with long-standing history of cervical dystonia, responded very well to previous EMG guided Dysport injection, last injection was May 2016, responded very well.   Mild retrocollis, right tilt, mild right lateral shift, slight left shoulder elevation  500 of Dysport was dissolved into 2.5 cc of normal saline,  Right longissimus capitus 0. 5 cc Right semispinalis 0.2 5 cc, Left semispinalis 0.25 cc Right inferior oblique capitis, 0.5 cc  Left inferior oblique capitis 0.5 cc Left levator scapular 0.5   Patient tolerate the injection well, will return to clinic in 3 months for repeat injection Please dissolve 500 units of Dysport into 2.5 cc of normal saline   Marcial Pacas, M.D. Ph.D.  [facility] [address], [place], [place] [address] Ph: [phone] Fax: [phone]

## 2017-01-24 ENCOUNTER — Emergency Department (HOSPITAL_COMMUNITY)
Admission: EM | Admit: 2017-01-24 | Discharge: 2017-01-24 | Disposition: A | Discharge status: Home / Self Care | Payer: 59 | Attending: Emergency Medicine | Admitting: Emergency Medicine

## 2017-01-24 ENCOUNTER — Encounter (HOSPITAL_COMMUNITY): Payer: Self-pay

## 2017-01-24 ENCOUNTER — Emergency Department (HOSPITAL_COMMUNITY): Payer: 59

## 2017-01-24 DIAGNOSIS — Z87891 Personal history of nicotine dependence: Secondary | ICD-10-CM | POA: Insufficient documentation

## 2017-01-24 DIAGNOSIS — Z79899 Other long term (current) drug therapy: Secondary | ICD-10-CM | POA: Insufficient documentation

## 2017-01-24 DIAGNOSIS — R52 Pain, unspecified: Secondary | ICD-10-CM

## 2017-01-24 DIAGNOSIS — R1032 Left lower quadrant pain: Secondary | ICD-10-CM | POA: Diagnosis not present

## 2017-01-24 DIAGNOSIS — R102 Pelvic and perineal pain: Secondary | ICD-10-CM | POA: Diagnosis not present

## 2017-01-24 LAB — CBC WITH DIFFERENTIAL/PLATELET
Basophils Absolute: 0.1 10*3/uL (ref 0.0–0.1)
Basophils Relative: 1 %
Eosinophils Absolute: 0.3 10*3/uL (ref 0.0–0.7)
Eosinophils Relative: 3 %
HCT: 43.1 % (ref 36.0–46.0)
Hemoglobin: 14.5 g/dL (ref 12.0–15.0)
Lymphocytes Relative: 40 %
Lymphs Abs: 3.4 10*3/uL (ref 0.7–4.0)
MCH: 30.4 pg (ref 26.0–34.0)
MCHC: 33.6 g/dL (ref 30.0–36.0)
MCV: 90.4 fL (ref 78.0–100.0)
Monocytes Absolute: 0.9 10*3/uL (ref 0.1–1.0)
Monocytes Relative: 10 %
Neutro Abs: 4 10*3/uL (ref 1.7–7.7)
Neutrophils Relative %: 46 %
Platelets: 229 10*3/uL (ref 150–400)
RBC: 4.77 MIL/uL (ref 3.87–5.11)
RDW: 14.3 % (ref 11.5–15.5)
WBC: 8.6 10*3/uL (ref 4.0–10.5)

## 2017-01-24 LAB — BASIC METABOLIC PANEL
Anion gap: 10 (ref 5–15)
BUN: 8 mg/dL (ref 6–20)
CO2: 25 mmol/L (ref 22–32)
Calcium: 9.4 mg/dL (ref 8.9–10.3)
Chloride: 105 mmol/L (ref 101–111)
Creatinine, Ser: 0.7 mg/dL (ref 0.44–1.00)
GFR calc Af Amer: >60 mL/min (ref >60)
GFR calc non Af Amer: >60 mL/min (ref >60)
Glucose, Bld: 83 mg/dL (ref 65–99)
Potassium: 3.6 mmol/L (ref 3.5–5.1)
Sodium: 140 mmol/L (ref 135–145)

## 2017-01-24 LAB — URINALYSIS, ROUTINE W REFLEX MICROSCOPIC
Bacteria, UA: NONE SEEN
Bilirubin Urine: NEGATIVE
Glucose, UA: NEGATIVE mg/dL
Ketones, ur: NEGATIVE mg/dL
Leukocytes, UA: NEGATIVE
Nitrite: NEGATIVE
Protein, ur: NEGATIVE mg/dL
Specific Gravity, Urine: 1.005 (ref 1.005–1.030)
pH: 5 (ref 5.0–8.0)

## 2017-01-24 LAB — WET PREP, GENITAL
Sperm: NONE SEEN
Trich, Wet Prep: NONE SEEN
Yeast Wet Prep HPF POC: NONE SEEN

## 2017-01-24 NOTE — ED Provider Notes (Signed)
[facility] DEPT Provider Note   CSN: [id number] Arrival date & time: 01/24/17  1045  By signing my name below, I, Courtney Myers, attest that this documentation has been prepared under the direction and in the presence of Courtney Myers  Electronically Signed: Ephriam Myers, ED Scribe. 01/24/17. 2:38 PM.  History   Chief Complaint Chief Complaint  Patient presents with  . Groin Pain    HPI HPI Comments: Courtney Myers is a 56 y.o. female who presents to the Emergency Department complaining of intermittent pain in her left groin that started yesterday morning. Pt left for work yesterday morning feeling normal. While pt was at work yesterday she stood up from her desk and notes a sudden onset left groin pain described as a sharp, shooting sensation. She took a few steps and reports an exacerbation of pain from this. Throughout the day she continued to experience this pain intermittently when bearing weight on the extremity. She notes that after she would walk for an extended period of time the pain would decrease. The pain would be worse when standing after sitting for a prolonged period of time. Pt returned home from work and took a nap but her symptoms did not subside. She further notes non-radiating, bilateral pain across her lower back. Pt notes she was treated with Cipro for a UTI two weeks ago but states that her symptoms subsided prior to current. Pt has had a partial hysterectomy but no other abdominal surgeries. No Hx of ovarian cysts. No Hx of hernias. No urinary symptoms noted. No dysuria, hematuria. No fever, vomiting, vaginal bleeding, discharge.   The history is provided by the patient. No language interpreter was used.    Past Medical History:  Diagnosis Date  . Anxiety   . Cervical dystonia   . Depression   . Fibromyalgia   . GERD (gastroesophageal reflux disease)   . History of shingles   . RA (rheumatoid arthritis) ([place])   . Synovitis of hand     Patient Active  Problem List   Diagnosis Date Noted  . Excessive sleepiness 09/08/2014  . Fibromyalgia syndrome 08/25/2014  . RA (rheumatoid arthritis) ([place])   . GERD (gastroesophageal reflux disease)   . Anxiety   . Depression   . History of shingles   . Synovitis of hand   . Cervical dystonia 12/04/2012    Past Surgical History:  Procedure Laterality Date  . TONSILLECTOMY    . VAGINAL HYSTERECTOMY  3/08    OB History    Gravida Para Term Preterm AB Living   4 2   2 2 2    SAB TAB Ectopic Multiple Live Births   1               Home Medications    Prior to Admission medications   Medication Sig Start Date End Date Taking? Authorizing Provider  AbobotulinumtoxinA (DYSPORT) 500 units SOLR injection Inject 500 Units into the muscle every 3 (three) months. 11/30/16   Marcial Pacas, MD  acetaminophen (TYLENOL) 325 MG tablet Take 650 mg by mouth once. One hour prior to infusion    [provider]  B-D TB SYRINGE 1CC/27GX1/2" 27G X 1/2" 1 ML MISC use 1 TB syringe once weekly 10/17/16   Bo Merino, MD  baclofen (LIORESAL) 10 MG tablet Take 10 mg by mouth daily as needed for muscle spasms.  05/01/13   [provider]  clonazePAM (KLONOPIN) 0.5 MG tablet take 1 tablet by mouth twice a day if  needed for anxiety 09/20/16   Marcial Pacas, MD  diphenhydrAMINE (BENADRYL) 25 mg capsule Take 25 mg by mouth once. One hour prior to infusion    [provider]  DULoxetine (CYMBALTA) 60 MG capsule take 1 capsule by mouth once daily 01/04/17   Bo Merino, MD  folic acid (FOLVITE) 1 MG tablet Take 1 tablet (1 mg total) by mouth daily. 07/26/16 10/19/17  Panwala, Naitik, Myers  inFLIXimab (REMICADE) 100 MG injection Inject 6 mg/kg into the vein every 6 (six) weeks. Infuse 500 mg/ 5 vials in 215mL NS over 2hours every 6 weeks    Bo Merino, MD  methotrexate 50 MG/2ML injection INJECT 0.3 MILLILITERS INTO SKIN ONCE A WEEK 12/11/16   Panwala, Naitik, Myers  Multiple Vitamins-Minerals  (MULTIVITAMINS THER. W/MINERALS) TABS Take 1 tablet by mouth daily.    [provider]  Omega-3 Fatty Acids (FISH OIL PO) Take 1,500 mg by mouth.    [provider]  omeprazole (PRILOSEC) 20 MG capsule take 1 capsule by mouth once daily 08/14/16   Susy Frizzle, MD  Probiotic Product (PROBIOTIC ADVANCED PO) Take by mouth.    [provider]  traMADol (ULTRAM) 50 MG tablet Take 1 tablet (50 mg total) by mouth every 6 (six) hours as needed. For pain 04/09/15   Billy Fischer, MD  Xylitol [facility] MT) Use as directed in the mouth or throat.    [provider]    Family History Family History  Problem Relation Age of Onset  . Arthritis Mother   . Heart Problems Father   . Heart disease Father   . Heart disease Paternal Uncle        MI    Social History Social History  Substance Use Topics  . Smoking status: Former Smoker    Packs/day: 0.50    Years: 30.00    Types: Cigarettes    Quit date: 05/02/2015  . Smokeless tobacco: Never Used  . Alcohol use 0.0 oz/week     Comment: Rare    Allergies   Patient has no known allergies.   Review of Systems Review of Systems  Constitutional: Negative for fever.  HENT: Negative for rhinorrhea and sore throat.   Eyes: Negative for redness.  Respiratory: Negative for cough.   Cardiovascular: Negative for chest pain.  Gastrointestinal: Negative for abdominal pain, diarrhea, nausea and vomiting.  Genitourinary: Positive for pelvic pain. Negative for dysuria, frequency, hematuria, urgency, vaginal bleeding, vaginal discharge and vaginal pain.  Musculoskeletal: Positive for back pain and myalgias (left groin). Negative for gait problem.  Skin: Negative for rash.  Neurological: Negative for headaches.     Physical Exam Updated Vital Signs BP (!) 125/101 (BP Location: Right Arm)   Pulse 78   Temp 97.9 F (36.6 C) (Oral)   Resp 16   SpO2 99%   Physical Exam  Constitutional: She is oriented to  person, place, and time. She appears well-developed and well-nourished. No distress.  HENT:  Head: Normocephalic and atraumatic.  Eyes: Conjunctivae are normal. Right eye exhibits no discharge. Left eye exhibits no discharge.  Neck: Normal range of motion. Neck supple.  Cardiovascular: Normal rate, regular rhythm and normal heart sounds.   Pulmonary/Chest: Effort normal and breath sounds normal.  Abdominal: Soft. There is tenderness (left lower abd/pelvic).  Genitourinary: Pelvic exam was performed with patient supine. Right adnexum displays no mass and no tenderness. Left adnexum displays tenderness. Left adnexum displays no mass. No erythema or tenderness in the vagina. No  signs of injury around the vagina. No vaginal discharge found.  Musculoskeletal:  Pain in L pelvis/pelvic crease that is worsened with flexion of the left hip.  Neurological: She is alert and oriented to person, place, and time.  Skin: Skin is warm and dry. She is not diaphoretic.  Psychiatric: She has a normal mood and affect. Judgment normal.  Nursing note and vitals reviewed.    ED Treatments / Results  DIAGNOSTIC STUDIES: Oxygen Saturation is 99% on RA, normal by my interpretation.  COORDINATION OF CARE: 2:38 PM-Will do pelvic exam. Discussed treatment plan with pt at bedside and pt agreed to plan.   Pelvic performed by PA-S2 Anitha under my direct supervision. Will proceed with [place] given tenderness.   6:11 PM patient comfortable, updated on results. Will discharge to home. She will continue Aleve and baclofen that she has at home. Encouraged follow-up with PCP/OBGYN.   The patient was urged to return to the Emergency Department immediately with worsening of current symptoms, worsening abdominal pain, persistent vomiting, blood noted in stools, fever, or any other concerns. The patient verbalized understanding.    Labs (all labs ordered are listed, but only abnormal results are displayed) Labs Reviewed  WET  PREP, GENITAL - Abnormal; Notable for the following:       Result Value   Clue Cells Wet Prep HPF POC PRESENT (*)    WBC, Wet Prep HPF POC MODERATE (*)    All other components within normal limits  URINALYSIS, ROUTINE W REFLEX MICROSCOPIC - Abnormal; Notable for the following:    Color, Urine STRAW (*)    Hgb urine dipstick MODERATE (*)    Squamous Epithelial / LPF 0-5 (*)    All other components within normal limits  CBC WITH DIFFERENTIAL/PLATELET  BASIC METABOLIC PANEL  GC/CHLAMYDIA PROBE AMP ([place]) NOT AT [facility]    Radiology US Transvaginal Non-ob  Result Date: 01/24/2017 CLINICAL DATA:  Left adnexal pain for 2 days. Back pain. History of partial hysterectomy. EXAM: TRANSABDOMINAL AND TRANSVAGINAL ULTRASOUND OF PELVIS TECHNIQUE: Both transabdominal and transvaginal ultrasound examinations of the pelvis were performed. Transabdominal technique was performed for global imaging of the pelvis including uterus, ovaries, adnexal regions, and pelvic cul-de-sac. COMPARISON:  None FINDINGS: Uterus Removed Right ovary Not visualized. Left ovary Not visualized. Other findings No abnormal free fluid. IMPRESSION: Negative pelvic ultrasound. Uterus has been removed. The ovaries could not be identified. No definable mass or free fluid or other abnormality in the pelvis. Electronically Signed   By: Lorriane Shire M.D.   On: 01/24/2017 17:10   US Pelvis Complete  Result Date: 01/24/2017 CLINICAL DATA:  Left adnexal pain for 2 days. Back pain. History of partial hysterectomy. EXAM: TRANSABDOMINAL AND TRANSVAGINAL ULTRASOUND OF PELVIS TECHNIQUE: Both transabdominal and transvaginal ultrasound examinations of the pelvis were performed. Transabdominal technique was performed for global imaging of the pelvis including uterus, ovaries, adnexal regions, and pelvic cul-de-sac. COMPARISON:  None FINDINGS: Uterus Removed Right ovary Not visualized. Left ovary Not visualized. Other findings No abnormal free fluid.  IMPRESSION: Negative pelvic ultrasound. Uterus has been removed. The ovaries could not be identified. No definable mass or free fluid or other abnormality in the pelvis. Electronically Signed   By: Lorriane Shire M.D.   On: 01/24/2017 17:10    Procedures Procedures (including critical care time)  Medications Ordered in ED Medications - No data to display   Initial Impression / Assessment and Plan / ED Course  I have reviewed the triage vital signs  and the nursing notes.  Pertinent labs & imaging results that were available during my care of the patient were reviewed by me and considered in my medical decision making (see chart for details).      Vital signs reviewed and are as follows: BP 136/76   Pulse 77   Temp 97.9 F (36.6 C) (Oral)   Resp 18   SpO2 99%     Final Clinical Impressions(s) / ED Diagnoses   Final diagnoses:  Left inguinal pain   Patient with left groin pain. Workup today is unremarkable including with ultrasound and lab work. Patient will continue symptomatic treatments at home. Suspect that this is most likely musculoskeletal in nature given reassuring workup and increase in pain with movements. Left upper extremity is neurovascularly intact. No back pain. No discharge to suggest BV.   New Prescriptions Discharge Medication List as of 01/24/2017  5:54 PM    I personally performed the services described in this documentation, which was scribed in my presence. The recorded information has been reviewed and is accurate.     Courtney Cater, Myers 01/24/17 1813    Daleen Bo, MD 01/25/17 1125

## 2017-01-24 NOTE — Discharge Instructions (Signed)
Please read and follow all provided instructions.  Your diagnoses today include:  1. Left inguinal pain   2. Pain     Tests performed today include:  Blood counts and electrolytes - normal  Ultrasound of your pelvis - no masses or other problems  Vital signs. See below for your results today.   Medications prescribed:   None  Take any prescribed medications only as directed.  Home care instructions:  Follow any educational materials contained in this packet.  BE VERY CAREFUL not to take multiple medicines containing Tylenol (also called acetaminophen). Doing so can lead to an overdose which can damage your liver and cause liver failure and possibly death.   Follow-up instructions: Please follow-up with your primary care provider in the next 5 days for further evaluation of your symptoms.   Return instructions:   Please return to the Emergency Department if you experience worsening symptoms.   Please return if you have any other emergent concerns.  Additional Information:  Your vital signs today were: BP 112/70    Pulse 74    Temp 97.9 F (36.6 C) (Oral)    Resp 16    SpO2 99%  If your blood pressure (BP) was elevated above 135/85 this visit, please have this repeated by your doctor within one month. --------------

## 2017-01-24 NOTE — ED Triage Notes (Signed)
Pt presents to the ed with complaints of left groin pain that is sharp and shooting in nature, denies any radiation, bruising or swelling, or injury. Denies any other symptoms. Pt also reports midlumbar pain not related to an injury. Pt is alert and oriented, ambulatory. In no apparent distress.

## 2017-01-25 LAB — GC/CHLAMYDIA PROBE AMP (~~LOC~~) NOT AT ARMC
Chlamydia: NEGATIVE
Neisseria Gonorrhea: NEGATIVE

## 2017-01-30 ENCOUNTER — Other Ambulatory Visit: Payer: Self-pay | Admitting: Radiology

## 2017-01-30 NOTE — Clinical Documentation Improvement (Signed)
Infusion orders are current for patient CBC CMP Tylenol Benadryl appointments are up to date and follow up appointment  is scheduled TB gold not due yet, due on 10/2017.

## 2017-01-31 ENCOUNTER — Ambulatory Visit (HOSPITAL_COMMUNITY)
Admission: RE | Admit: 2017-01-31 | Discharge: 2017-01-31 | Disposition: A | Discharge status: Home / Self Care | Payer: 59 | Source: Ambulatory Visit | Attending: Rheumatology | Admitting: Rheumatology

## 2017-01-31 DIAGNOSIS — M0609 Rheumatoid arthritis without rheumatoid factor, multiple sites: Secondary | ICD-10-CM | POA: Insufficient documentation

## 2017-01-31 MED ORDER — DIPHENHYDRAMINE HCL 25 MG PO CAPS: 25.0000 mg | ORAL_CAPSULE | Freq: Once | ORAL | Status: DC

## 2017-01-31 MED ORDER — ACETAMINOPHEN 325 MG PO TABS: 650.0000 mg | ORAL_TABLET | Freq: Once | ORAL | Status: DC

## 2017-01-31 MED ORDER — SODIUM CHLORIDE 0.9 % IV SOLN
500.0000 mg | INTRAVENOUS | Status: DC
  Administered 2017-01-31: 500 mg via INTRAVENOUS
  Filled 2017-01-31: qty 50

## 2017-01-31 MED ORDER — SODIUM CHLORIDE 0.9 % IV SOLN
INTRAVENOUS | Status: DC
  Administered 2017-01-31: 08:00:00 via INTRAVENOUS

## 2017-02-01 IMAGING — DX DG CHEST 2V
2 series · 2 of 2 positions shown · non-contrast
Comparison: 04/09/2015

CLINICAL DATA: Pain between shoulder blades for 3 days. Shortness
of breath

EXAM:
CHEST  2 VIEW

[chest pa]
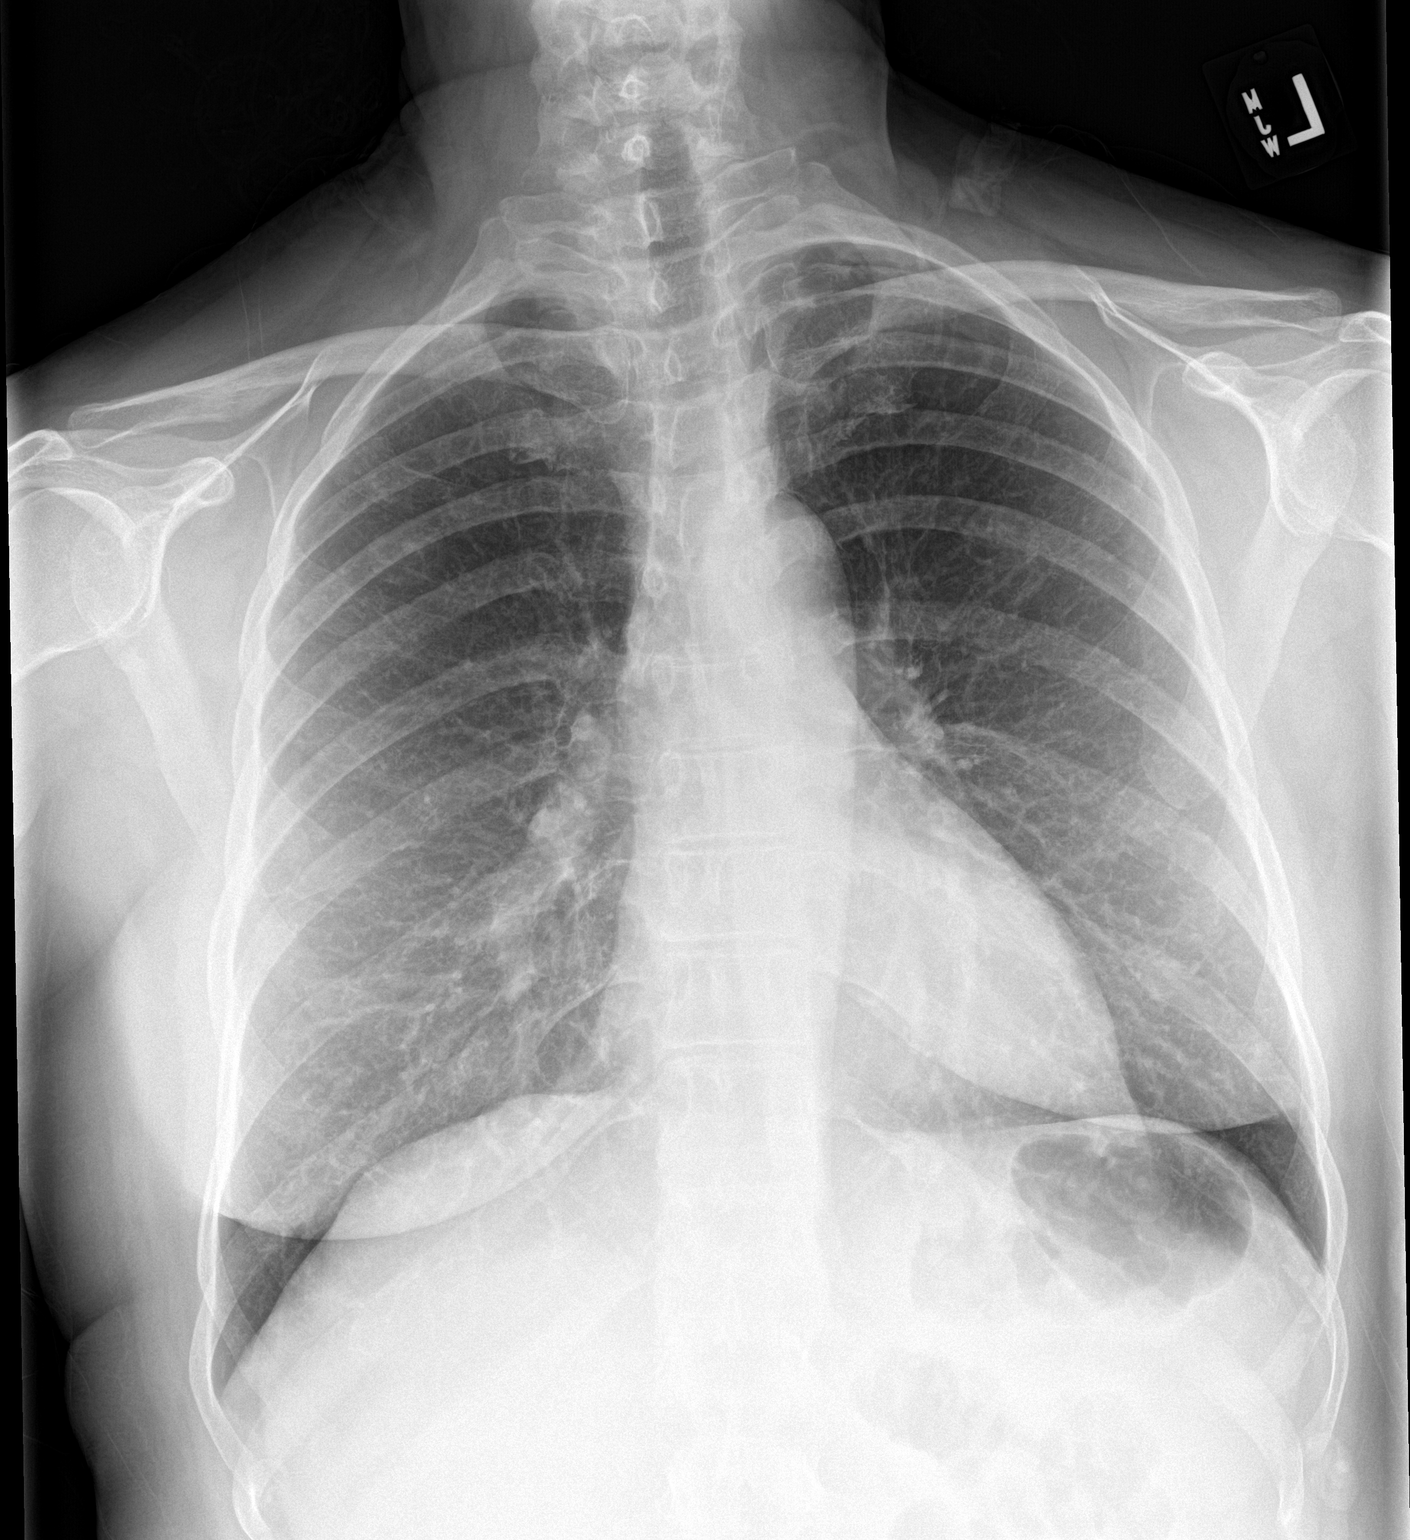

[chest lat]
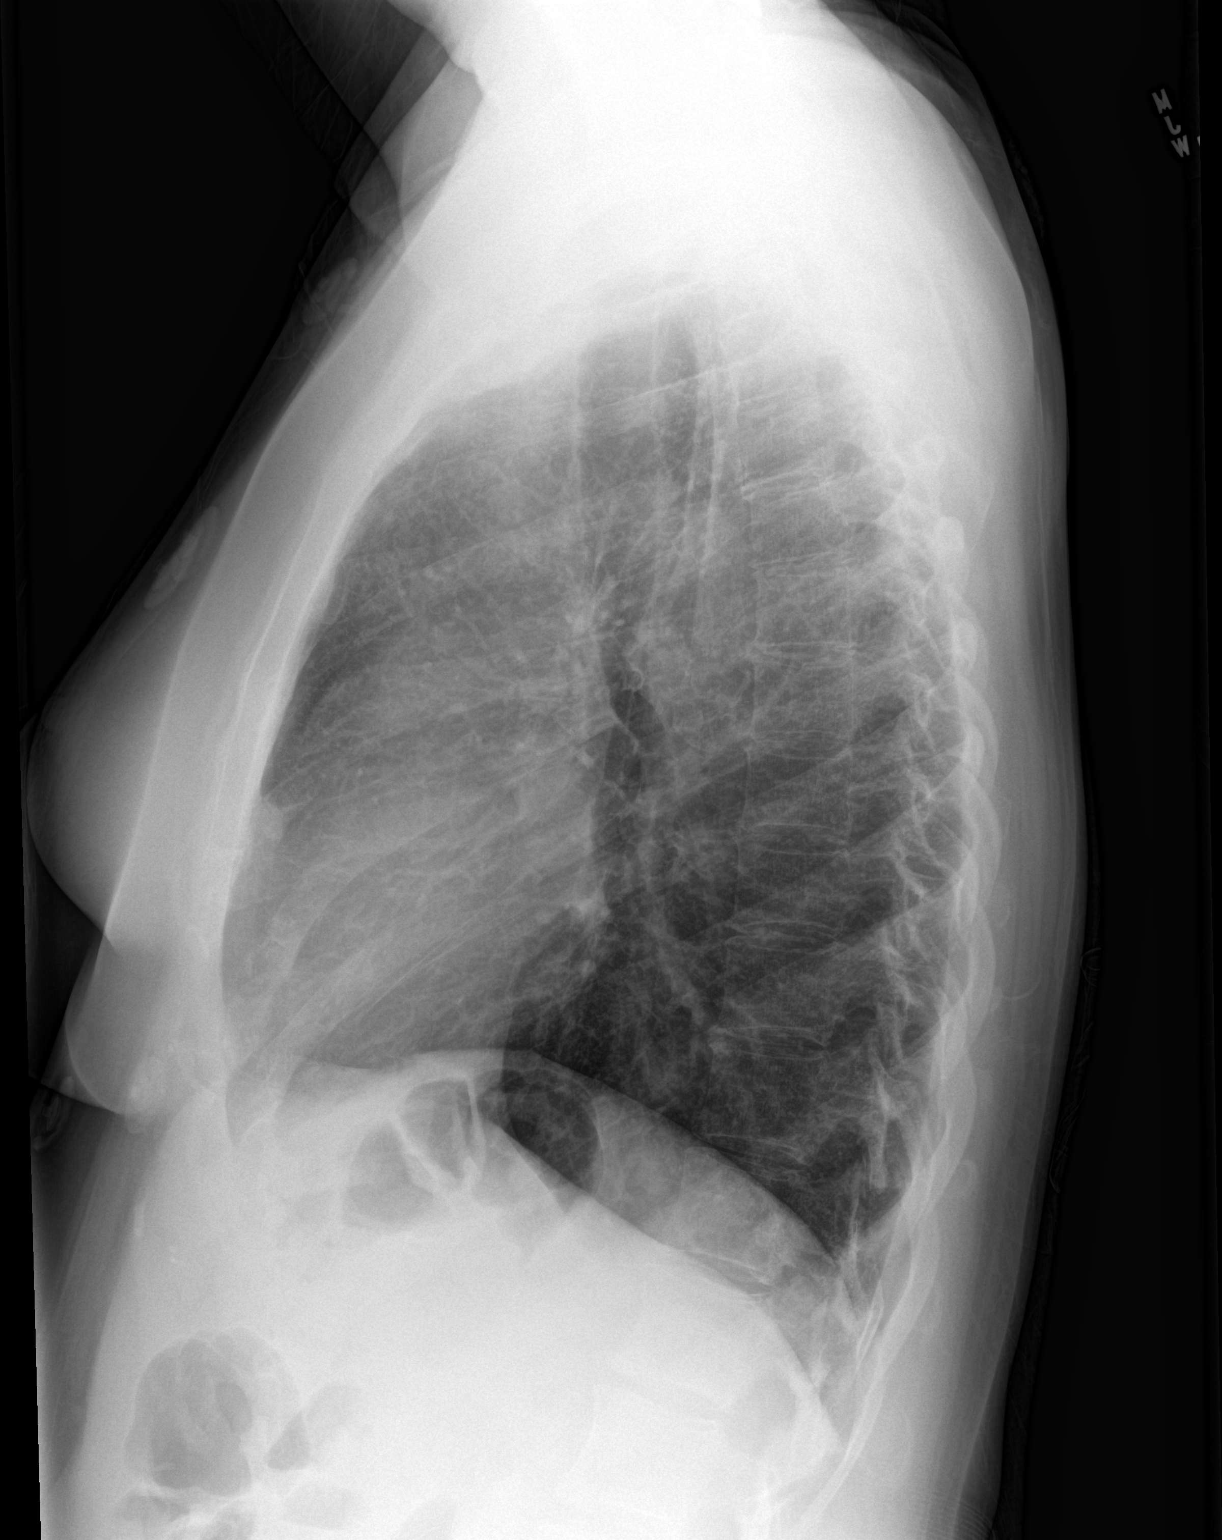

[2 of 2 positions shown; findings below may reference images not displayed]

FINDINGS: The heart size and mediastinal contours are within normal limits.
Both lungs are clear. The visualized skeletal structures are
unremarkable.
IMPRESSION: No active cardiopulmonary disease.

## 2017-03-07 ENCOUNTER — Other Ambulatory Visit: Payer: Self-pay | Admitting: Neurology

## 2017-03-08 ENCOUNTER — Ambulatory Visit (INDEPENDENT_AMBULATORY_CARE_PROVIDER_SITE_OTHER): Payer: 59

## 2017-03-08 ENCOUNTER — Ambulatory Visit (INDEPENDENT_AMBULATORY_CARE_PROVIDER_SITE_OTHER): Payer: 59 | Admitting: Orthopedic Surgery

## 2017-03-08 ENCOUNTER — Encounter (INDEPENDENT_AMBULATORY_CARE_PROVIDER_SITE_OTHER): Payer: Self-pay | Admitting: Orthopedic Surgery

## 2017-03-08 VITALS — BP 135/88 | HR 95 | Ht 64.0 in | Wt 165.0 lb

## 2017-03-08 DIAGNOSIS — M25532 Pain in left wrist: Secondary | ICD-10-CM

## 2017-03-08 NOTE — Progress Notes (Signed)
Office Visit Note   Patient: Courtney Myers           Date of Birth: 01-05-1961           MRN: [id number] Visit Date: 03/08/2017              Requested by: Orlena Sheldon, PA-C [address] [place], [place] [address] PCP: Orlena Sheldon, PA-C   Assessment & Plan: Visit Diagnoses:  1. Pain in left wrist     Plan:  #1: Thumb spica brace left hand #2: Voltaren gel 3-4 times per day #3: If no better in the next 3-4 weeks she will give [place] a call and we'll schedule an MRI arthrogram of her left wrist.  Follow-Up Instructions: Return if symptoms worsen or fail to improve.   Face-to-face time spent with patient was greater than 20 minutes.  Greater than 50% of the time was spent in counseling and coordination of care.  Orders:  Orders Placed This Encounter  Procedures  . XR Wrist Complete Left   No orders of the defined types were placed in this encounter.     Procedures: No procedures performed   Clinical Data: No additional findings.   Subjective: Chief Complaint  Patient presents with  . Left Wrist - Pain, Weakness    HPI  Arwyn is very pleasant 56 year old white female patient of Dr. Estanislado Pandy for rheumatoid arthritis. Back in June she had mentioned to Dr. Estanislado Pandy that she was having pain in the wrist. She at that time had bought a short wrist splint. However her pain is mainly centered along the radial styloid area. It is more at the distal portion of the radial styloid. She denies any history of injury or trauma. Seen today for evaluation.  Review of Systems  Constitutional: Negative for chills, fatigue and fever.  Eyes: Negative for itching.  Respiratory: Negative for chest tightness and shortness of breath.   Cardiovascular: Negative for chest pain, palpitations and leg swelling.  Gastrointestinal: Negative for blood in stool, constipation and diarrhea.  Musculoskeletal: Positive for neck stiffness. Negative for back pain, joint swelling and neck pain.        History of rheumatoid arthritis  Neurological: Positive for weakness. Negative for dizziness and headaches.  Hematological: Does not bruise/bleed easily.  Psychiatric/Behavioral: Negative for sleep disturbance. The patient is not nervous/anxious.      Objective: Vital Signs: BP 135/88   Pulse 95   Ht 5\' 4"  (1.626 m)   Wt 165 lb (74.8 kg)   BMI 28.32 kg/m   Physical Exam  Constitutional: She is oriented to person, place, and time. She appears well-developed and well-nourished.  HENT:  Head: Normocephalic and atraumatic.  Eyes: Pupils are equal, round, and reactive to light. EOM are normal.  Pulmonary/Chest: Effort normal.  Neurological: She is alert and oriented to person, place, and time.  Skin: Skin is warm and dry.  Psychiatric: She has a normal mood and affect. Her behavior is normal. Judgment and thought content normal.    Ortho Exam  She has minimal tenderness over the first dorsal compartment the lab. No real pain with that testing for de Quervain's. She is tender to palpation at the tip of the radial styloid. No first CMC pain to palpation or compression of the joint. No pain over the remaining wrist to palpation. Good motion of the wrist without pain.  Specialty Comments:  No specialty comments available.  Imaging: Xr Wrist Complete Left  Result  Date: 03/08/2017 4 view x-ray of the left wrist reveals a small cyst at the distal tip of the radial styloid. She does have some subluxation of the first [facility] joint.    PMFS History: Patient Active Problem List   Diagnosis Date Noted  . Excessive sleepiness 09/08/2014  . Fibromyalgia syndrome 08/25/2014  . RA (rheumatoid arthritis) ([place])   . GERD (gastroesophageal reflux disease)   . Anxiety   . Depression   . History of shingles   . Synovitis of hand   . Cervical dystonia 12/04/2012   Past Medical History:  Diagnosis Date  . Anxiety   . Cervical dystonia   . Depression   . Fibromyalgia   . GERD  (gastroesophageal reflux disease)   . History of shingles   . RA (rheumatoid arthritis) ([place])   . Synovitis of hand     Family History  Problem Relation Age of Onset  . Arthritis Mother   . Heart Problems Father   . Heart disease Father   . Heart disease Paternal Uncle        MI    Past Surgical History:  Procedure Laterality Date  . TONSILLECTOMY    . VAGINAL HYSTERECTOMY  3/08   Social History   Occupational History  .  [place] History Main Topics  . Smoking status: Former Smoker    Packs/day: 0.50    Years: 30.00    Types: Cigarettes    Quit date: 05/02/2015  . Smokeless tobacco: Never Used  . Alcohol use 0.0 oz/week     Comment: Rare  . Drug use: No  . Sexual activity: Yes    Birth control/ protection: Surgical     Comment: 1st intercourse 56 yo-Fewer than 5 partners

## 2017-03-15 ENCOUNTER — Encounter (INDEPENDENT_AMBULATORY_CARE_PROVIDER_SITE_OTHER): Payer: Self-pay | Admitting: Orthopedic Surgery

## 2017-03-15 ENCOUNTER — Other Ambulatory Visit (INDEPENDENT_AMBULATORY_CARE_PROVIDER_SITE_OTHER): Payer: Self-pay

## 2017-03-15 ENCOUNTER — Ambulatory Visit (HOSPITAL_COMMUNITY)
Admission: RE | Admit: 2017-03-15 | Discharge: 2017-03-15 | Disposition: A | Discharge status: Home / Self Care | Payer: 59 | Source: Ambulatory Visit | Attending: Rheumatology | Admitting: Rheumatology

## 2017-03-15 DIAGNOSIS — M0609 Rheumatoid arthritis without rheumatoid factor, multiple sites: Secondary | ICD-10-CM | POA: Insufficient documentation

## 2017-03-15 DIAGNOSIS — M25532 Pain in left wrist: Secondary | ICD-10-CM

## 2017-03-15 LAB — CBC WITH DIFFERENTIAL/PLATELET
Basophils Absolute: 0.1 10*3/uL (ref 0.0–0.1)
Basophils Relative: 1 %
Eosinophils Absolute: 0.2 10*3/uL (ref 0.0–0.7)
Eosinophils Relative: 2 %
HCT: 40.5 % (ref 36.0–46.0)
Hemoglobin: 13.5 g/dL (ref 12.0–15.0)
Lymphocytes Relative: 36 %
Lymphs Abs: 3 10*3/uL (ref 0.7–4.0)
MCH: 30.2 pg (ref 26.0–34.0)
MCHC: 33.3 g/dL (ref 30.0–36.0)
MCV: 90.6 fL (ref 78.0–100.0)
Monocytes Absolute: 0.7 10*3/uL (ref 0.1–1.0)
Monocytes Relative: 8 %
Neutro Abs: 4.6 10*3/uL (ref 1.7–7.7)
Neutrophils Relative %: 53 %
Platelets: 260 10*3/uL (ref 150–400)
RBC: 4.47 MIL/uL (ref 3.87–5.11)
RDW: 14.6 % (ref 11.5–15.5)
WBC: 8.5 10*3/uL (ref 4.0–10.5)

## 2017-03-15 LAB — COMPREHENSIVE METABOLIC PANEL
ALT: 19 U/L (ref 14–54)
AST: 21 U/L (ref 15–41)
Albumin: 3.6 g/dL (ref 3.5–5.0)
Alkaline Phosphatase: 75 U/L (ref 38–126)
Anion gap: 7 (ref 5–15)
BUN: 10 mg/dL (ref 6–20)
CO2: 25 mmol/L (ref 22–32)
Calcium: 9.1 mg/dL (ref 8.9–10.3)
Chloride: 103 mmol/L (ref 101–111)
Creatinine, Ser: 0.77 mg/dL (ref 0.44–1.00)
GFR calc Af Amer: >60 mL/min (ref >60)
GFR calc non Af Amer: >60 mL/min (ref >60)
Glucose, Bld: 103 mg/dL — ABNORMAL HIGH (ref 65–99)
Potassium: 3.7 mmol/L (ref 3.5–5.1)
Sodium: 135 mmol/L (ref 135–145)
Total Bilirubin: 0.4 mg/dL (ref 0.3–1.2)
Total Protein: 6.8 g/dL (ref 6.5–8.1)

## 2017-03-15 MED ORDER — SODIUM CHLORIDE 0.9 % IV SOLN
INTRAVENOUS | Status: DC
  Administered 2017-03-15: 09:00:00 via INTRAVENOUS

## 2017-03-15 MED ORDER — DIPHENHYDRAMINE HCL 25 MG PO CAPS: 25.0000 mg | ORAL_CAPSULE | Freq: Once | ORAL | Status: DC

## 2017-03-15 MED ORDER — SODIUM CHLORIDE 0.9 % IV SOLN
500.0000 mg | INTRAVENOUS | Status: DC
  Administered 2017-03-15: 500 mg via INTRAVENOUS
  Filled 2017-03-15 (×2): qty 50

## 2017-03-15 MED ORDER — ACETAMINOPHEN 325 MG PO TABS: 650.0000 mg | ORAL_TABLET | Freq: Once | ORAL | Status: DC

## 2017-03-15 NOTE — Progress Notes (Signed)
WNLs

## 2017-03-22 DIAGNOSIS — G243 Spasmodic torticollis: Secondary | ICD-10-CM | POA: Diagnosis not present

## 2017-04-04 ENCOUNTER — Ambulatory Visit
Admission: RE | Admit: 2017-04-04 | Discharge: 2017-04-04 | Disposition: A | Discharge status: Home / Self Care | Payer: 59 | Source: Ambulatory Visit | Attending: Orthopedic Surgery | Admitting: Orthopedic Surgery

## 2017-04-04 ENCOUNTER — Encounter (INDEPENDENT_AMBULATORY_CARE_PROVIDER_SITE_OTHER): Payer: Self-pay | Admitting: Orthopedic Surgery

## 2017-04-04 DIAGNOSIS — M25532 Pain in left wrist: Secondary | ICD-10-CM

## 2017-04-04 DIAGNOSIS — S66812A Strain of other specified muscles, fascia and tendons at wrist and hand level, left hand, initial encounter: Secondary | ICD-10-CM | POA: Diagnosis not present

## 2017-04-04 MED ORDER — IOPAMIDOL (ISOVUE-M 200) INJECTION 41%: 3.0000 mL | Freq: Once | INTRAMUSCULAR | Status: DC

## 2017-04-06 ENCOUNTER — Encounter (INDEPENDENT_AMBULATORY_CARE_PROVIDER_SITE_OTHER): Payer: Self-pay | Admitting: Orthopedic Surgery

## 2017-04-10 ENCOUNTER — Ambulatory Visit (INDEPENDENT_AMBULATORY_CARE_PROVIDER_SITE_OTHER): Payer: 59 | Admitting: Orthopaedic Surgery

## 2017-04-10 DIAGNOSIS — M79641 Pain in right hand: Secondary | ICD-10-CM

## 2017-04-10 MED ORDER — LIDOCAINE HCL 1 % IJ SOLN
1.0000 mL | INTRAMUSCULAR | Status: AC | PRN
  Administered 2017-04-10: 1 mL

## 2017-04-10 MED ORDER — METHYLPREDNISOLONE ACETATE 40 MG/ML IJ SUSP
40.0000 mg | INTRAMUSCULAR | Status: AC | PRN
  Administered 2017-04-10: 40 mg

## 2017-04-10 NOTE — Progress Notes (Signed)
Office Visit Note   Patient: Courtney Myers           Date of Birth: September 07, 1960           MRN: [id number] Visit Date: 04/10/2017              Requested by: Courtney Sheldon, PA-C [address] [place], [place] [address] PCP: Courtney Sheldon, PA-C   Assessment & Plan: Visit Diagnoses:  1. Pain of right hand    Dequervain's tenosynovitis left wrist Plan: MRI scan with contrast of the left wrist demonstrates a small perforation of the central portion of the triangular fibrocartilage complex. Patient is not symptomatic in that area. There is mild tendinosis of the abductor pollicis longus with a small partial tear and mild teno synovitis. Patient is symptomatic in that area consistent with de Quervain's. I will inject the first dorsal extensor compartment. She has a splint at home. We'll reevaluate in the next 3-4 weeks if no improvement  Follow-Up Instructions: Return if symptoms worsen or fail to improve.   Orders:  No orders of the defined types were placed in this encounter.  No orders of the defined types were placed in this encounter.     Procedures: Hand/UE Inj Date/Time: 04/10/2017 2:44 PM Performed by: Courtney Myers Authorized by: Courtney Myers   Consent Given by:  Patient Indications:  Pain Condition: de Quervain's   Site:  R extensor compartment 1 Needle Size:  27 G Approach:  Dorsal Medications:  1 mL lidocaine 1 %; 40 mg methylPREDNISolone acetate 40 MG/ML     Clinical Data: No additional findings.   Subjective: No chief complaint on file. MRI scan performed last several days demonstrating an area of inflammation about the abductor pollicis longus tendon of the left wrist. Courtney Myers is symptomatic in that area. She is not experiencing any pain in the dorsum of her wrist  HPI  Review of Systems   Objective: Vital Signs: There were no vitals taken for this visit.  Physical Exam  Ortho Exam awake alert and oriented 3 comfortable  sitting. Local tenderness over the distal radius dorsally in the area of the first dorsal extensor compartment. Positive Finkelstein's test. No pain at the radiocarpal joint. No pain at the base of the thumb. Skin intact. No distal edema. Good grip and good release.  Specialty Comments:  No specialty comments available.  Imaging: No results found.   PMFS History: Patient Active Problem List   Diagnosis Date Noted  . Excessive sleepiness 09/08/2014  . Fibromyalgia syndrome 08/25/2014  . RA (rheumatoid arthritis) ([place])   . GERD (gastroesophageal reflux disease)   . Anxiety   . Depression   . History of shingles   . Synovitis of hand   . Cervical dystonia 12/04/2012   Past Medical History:  Diagnosis Date  . Anxiety   . Cervical dystonia   . Depression   . Fibromyalgia   . GERD (gastroesophageal reflux disease)   . History of shingles   . RA (rheumatoid arthritis) ([place])   . Synovitis of hand     Family History  Problem Relation Age of Onset  . Arthritis Mother   . Heart Problems Father   . Heart disease Father   . Heart disease Paternal Uncle        MI    Past Surgical History:  Procedure Laterality Date  . TONSILLECTOMY    . VAGINAL HYSTERECTOMY  3/08   Social History  Occupational History  .  [place] History Main Topics  . Smoking status: Former Smoker    Packs/day: 0.50    Years: 30.00    Types: Cigarettes    Quit date: 05/02/2015  . Smokeless tobacco: Never Used  . Alcohol use 0.0 oz/week     Comment: Rare  . Drug use: No  . Sexual activity: Yes    Birth control/ protection: Surgical     Comment: 1st intercourse 56 yo-Fewer than 5 partners     Courtney Balding, MD   Note - This record has been created using [employer].  Chart creation errors have been sought, but may not always  have been located. Such creation errors do not reflect on  the standard of medical care.

## 2017-04-11 ENCOUNTER — Other Ambulatory Visit: Payer: Self-pay | Admitting: Rheumatology

## 2017-04-11 DIAGNOSIS — Z79899 Other long term (current) drug therapy: Secondary | ICD-10-CM

## 2017-04-11 NOTE — Progress Notes (Signed)
Infusion orders updated today including CBC CMP Tylenol and Benadryl appointments are up to date and a follow up appointment is scheduled/ TB gold is due on April 2019

## 2017-04-11 NOTE — Addendum Note (Signed)
Addended byCandice Camp on: 04/11/2017 08:43 AM   Modules accepted: Orders, SmartSet

## 2017-04-14 NOTE — Progress Notes (Signed)
Office Visit Note  Patient: Courtney Myers             Date of Birth: Jul 17, 1960           MRN: [id number]             PCP: Courtney Myers Referring: Courtney Myers Visit Date: 04/26/2017 Occupation: @GUAROCC @    Subjective:  Joint stiffness.   History of Present Illness: Courtney Myers is a 56 y.o. female with history of seronegative rheumatoid arthritis. She is doing quite well on Remicade and methotrexate combination. She reports occasional joint discomfort and swelling. She has had no recent episodes of joint swelling. She has some joint discomfort as well. She reports some morning stiffness. She has some discomfort from underlying fibromyalgia as well. Patient reports having a fibromyalgia flare about a month ago.  Activities of Daily Living:  Patient reports morning stiffness for 30 minutes.   Patient Reports nocturnal pain.  Difficulty dressing/grooming: Denies Difficulty climbing stairs: Denies Difficulty getting out of chair: Reports Difficulty using hands for taps, buttons, cutlery, and/or writing: Denies   Review of Systems  Constitutional: Positive for fatigue. Negative for night sweats, weight gain, weight loss and weakness.  HENT: Positive for mouth dryness. Negative for mouth sores, trouble swallowing, trouble swallowing and nose dryness.   Eyes: Positive for dryness. Negative for pain, redness and visual disturbance.  Respiratory: Negative.  Negative for cough, shortness of breath and difficulty breathing.   Cardiovascular: Negative.  Negative for chest pain, palpitations, hypertension, irregular heartbeat and swelling in legs/feet.  Gastrointestinal: Negative.  Negative for blood in stool, constipation and diarrhea.  Endocrine: Negative.  Negative for increased urination.  Genitourinary: Negative.  Negative for nocturia and vaginal dryness.  Musculoskeletal: Positive for arthralgias, joint pain and morning stiffness. Negative for joint swelling,  myalgias, muscle weakness, muscle tenderness and myalgias.  Skin: Positive for hair loss. Negative for color change, rash, skin tightness, ulcers and sensitivity to sunlight.  Allergic/Immunologic: Negative for susceptible to infections.  Neurological: Negative for dizziness, light-headedness, headaches, memory loss and night sweats.  Hematological: Negative.  Negative for swollen glands.  Psychiatric/Behavioral: Positive for depressed mood and sleep disturbance. The patient is nervous/anxious.     PMFS History:  Patient Active Problem List   Diagnosis Date Noted  . Excessive sleepiness 09/08/2014  . Fibromyalgia syndrome 08/25/2014  . RA (rheumatoid arthritis) ([place])   . GERD (gastroesophageal reflux disease)   . Anxiety   . Depression   . History of shingles   . Synovitis of hand   . Cervical dystonia 12/04/2012    Past Medical History:  Diagnosis Date  . Anxiety   . Cervical dystonia   . Depression   . Fibromyalgia   . GERD (gastroesophageal reflux disease)   . History of shingles   . RA (rheumatoid arthritis) ([place])   . Synovitis of hand     Family History  Problem Relation Age of Onset  . Arthritis Mother   . Heart Problems Father   . Heart disease Father   . Heart disease Paternal Uncle        MI   Past Surgical History:  Procedure Laterality Date  . TONSILLECTOMY    . 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 Right handed.    Caffeine- 4 cups daily.  Objective: Vital Signs: BP 117/75   Pulse 83   Resp 12   Ht 5\' 7"  (1.702 m)   Wt 181 lb (82.1 kg)   BMI 28.35 kg/m    Physical Exam  Constitutional: She is oriented to person, place, and time. She appears well-developed and well-nourished.  HENT:  Head: Normocephalic and atraumatic.  Eyes: Conjunctivae and EOM are normal.  Neck: Normal range of  motion.  Cardiovascular: Normal rate, regular rhythm, normal heart sounds and intact distal pulses.   Pulmonary/Chest: Effort normal and breath sounds normal.  Abdominal: Soft. Bowel sounds are normal.  Lymphadenopathy:    She has no cervical adenopathy.  Neurological: She is alert and oriented to person, place, and time.  Skin: Skin is warm and dry. Capillary refill takes less than 2 seconds.  Psychiatric: She has a normal mood and affect. Her behavior is normal.  Nursing note and vitals reviewed.    Musculoskeletal Exam: C-spine and thoracic lumbar spine good range of motion. Shoulder joints elbow joints wrist joint MCPs PIPs DIPs are good range of motion. She has some tenderness across PIPs but no synovitis was noted. Hip joints knee joints ankles MTPs PIPs with good range of motion with no synovitis. She is some discomfort in her PIPs and DIPs of her feet which I believe is second to osteoarthritis. Fibromyalgia tender points only 6 out of 18 positive.  CDAI Exam: CDAI Homunculus Exam:   Joint Counts:  CDAI Tender Joint count: 0 CDAI Swollen Joint count: 0  Global Assessments:  Patient Global Assessment: 3 Provider Global Assessment: 3  CDAI Calculated Score: 6    Investigation: No additional findings.TB Gold: negative 10/2016 CBC Latest Ref Rng & Units 03/15/2017 01/24/2017 12/20/2016  WBC 4.0 - 10.5 K/uL 8.5 8.6 11.0(H)  Hemoglobin 12.0 - 15.0 g/dL 13.5 14.5 13.6  Hematocrit 36.0 - 46.0 % 40.5 43.1 40.4  Platelets 150 - 400 K/uL 260 229 241   CMP Latest Ref Rng & Units 03/15/2017 01/24/2017 12/20/2016  Glucose 65 - 99 mg/dL 103(H) 83 101(H)  BUN 6 - 20 mg/dL 10 8 8   Creatinine 0.44 - 1.00 mg/dL 0.77 0.70 0.79  Sodium 135 - 145 mmol/L 135 140 135  Potassium 3.5 - 5.1 mmol/L 3.7 3.6 5.6(H)  Chloride 101 - 111 mmol/L 103 105 105  CO2 22 - 32 mmol/L 25 25 24   Calcium 8.9 - 10.3 mg/dL 9.1 9.4 9.0  Total Protein 6.5 - 8.1 g/dL 6.8 - 6.7  Total Bilirubin 0.3 - 1.2 mg/dL 0.4 -  1.3(H)  Alkaline Phos 38 - 126 U/L 75 - 74  AST 15 - 41 U/L 21 - 48(H)  ALT 14 - 54 U/L 19 - 16    Imaging: Mr Wrist Left W/ Contrast  Result Date: 04/04/2017 CLINICAL DATA:  Wrist pain, inflammatory arthritis. EXAM: MRI OF THE LEFT WRIST WITH CONTRAST(MR Arthrogram) TECHNIQUE: Multiplanar, multisequence MR imaging of the wrist was performed immediately following contrast injection into the radiocarpal joint under fluoroscopic guidance. No intravenous contrast was administered. COMPARISON:  None. FINDINGS: Ligaments: Intact scapholunate and lunotriquetral ligaments. Intact dorsal and volar intercarpal ligaments. Triangular fibrocartilage: Perforation of the central portion of the triangular fibrocartilage complex. Tendons: Intact flexor compartment tendons. Mild tendinosis of the abductor pollicis longus with a small partial tear and mild tenosynovitis. Carpal tunnel/median nerve: Normal carpal tunnel. Normal median nerve. Guyon's canal: Normal Guyon's canal. Joint/cartilage: Mild partial-thickness cartilage loss of the first CMC joint. Otherwise, no chondral defect. No significant joint effusion involving  the [facility] joints or mid carpal joint. Bones/carpal alignment: No acute osseous abnormality. No aggressive osseous lesion. No fracture or dislocation. No erosive changes. Normal alignment. Other: No fluid collection or hematoma. IMPRESSION: 1. Mild tendinosis of the abductor pollicis longus with a small partial tear and mild tenosynovitis. 2. Perforation of the central portion of the triangle fibrocartilage complex. 3. 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 Electronically Signed   By: Kathreen Devoid   On: 04/04/2017 16:25   Arthrogram  Result Date: 04/04/2017 CLINICAL DATA:  Chronic left wrist pain. FLUOROSCOPY TIME:  Radiation Exposure Index (as provided by the fluoroscopic device): 0.37 uGy*m2 Fluoroscopy Time:  12 Number of Acquired Images:  0 PROCEDURE: Left WRIST INJECTION UNDER FLUOROSCOPY An  appropriate skin entrance site was determined. The site was marked, prepped with Betadine, draped in the usual sterile fashion, and infiltrated locally with 1% Lidocaine. A 25 gauge skin needle was advanced into the radiocarpal joint under intermittent fluoroscopy. A mixture of 0.1 ml Multihance and 20 ml of dilute Isovue-M 200 was then used to opacify the proximal carpal joint. No immediate complication. IMPRESSION: Technically successful left wrist injection for MRI. Electronically Signed   By: San Morelle M.D.   On: 04/04/2017 16:16   Xr Foot 2 Views Right  Result Date: 04/26/2017 PIP/DIP and first MTP narrowing was noted. No joint space narrowing or erosive changes in the MTP joints were noted. No intertarsal joint space narrowing was noted. Impression: These fine is a consistent with osteoarthritis.  Xr Hand 2 View Left  Result Date: 04/26/2017 PIP/DIP narrowing was noted. No MCP joint space or intercarpal joint space narrowing was noted. No erosive changes were noted. These findings are consistent with osteoarthritis.  Xr Hand 2 View Right  Result Date: 04/26/2017 PIP/DIP narrowing was noted. No MCP joint space or intercarpal joint space narrowing was noted. No erosive changes were noted. These findings are consistent with osteoarthritis.   Speciality Comments: Remicade 6mg /kg every 6 weeks  TB gold negative 10/27/16    Procedures:  No procedures performed Allergies: Patient has no known allergies.   Assessment / Plan:     Visit Diagnoses: Rheumatoid arthritis of multiple sites with negative rheumatoid factor ([place]): Her rheumatoid arthritis seems to be very well controlled currently. She had no synovitis on examination. We discussed his pacing Remicade infusions to every 8 weeks and she was in agreement. We will try this and if she starts having flares we can go back to every 6 weeks.  High risk medication use - Remicade 6mg /Kg q 6 wks and MTX 0.3TW sq qwk , Folic acid 1mg   po qd. Her labs are stable and are monitored with her infusions.  Pain in both hands -she reports intermittent pain in her hands. Plan: XR Hand 2 View Right, XR Hand 2 View Left  Pain in right foot -she reports pain in her feet. I reviewed her x-ray of her left foot from January 2018 which showed only osteoarthritic changes. No MTP joint space narrowing or erosive changes were noted. Plan: XR Foot 2 Views Right  Fibromyalgia: She continues to have some pain and discomfort from underlying fibromyalgia. She is on Cymbalta 60 mg by mouth daily.  History of gastroesophageal reflux (GERD)  Primary insomnia: Better with medications.  Anxiety and depression : Stable.   Orders: Orders Placed This Encounter  Procedures  . XR Hand 2 View Right  . XR Hand 2 View Left  . XR Foot 2 Views Right  No orders of the defined types were placed in this encounter.   Face-to-face time spent with patient was 30 minutes. Greater than 50% of time was spent in counseling and coordination of care.  Follow-Up Instructions: Return in about 5 months (around 09/24/2017) for Rheumatoid arthritis.   Bo Merino, MD  Note - This record has been created using [employer].  Chart creation errors have been sought, but may not always  have been located. Such creation errors do not reflect on  the standard of medical care.

## 2017-04-25 ENCOUNTER — Ambulatory Visit (INDEPENDENT_AMBULATORY_CARE_PROVIDER_SITE_OTHER): Payer: 59 | Admitting: Neurology

## 2017-04-25 ENCOUNTER — Encounter: Payer: Self-pay | Admitting: Neurology

## 2017-04-25 VITALS — BP 122/74 | HR 92 | Ht 67.0 in | Wt 178.0 lb

## 2017-04-25 DIAGNOSIS — G243 Spasmodic torticollis: Secondary | ICD-10-CM | POA: Diagnosis not present

## 2017-04-25 NOTE — Progress Notes (Signed)
**  Dysport 500 units x 1 vial, [place] [address]-5, Lot [id number], Exp 10/30/17, specialty pharmacy.//mck,rn**

## 2017-04-25 NOTE — Progress Notes (Signed)
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  Symptom onset was without apparent triggering event, she has gradually developed this neck pulling,  manifested primarily with right laterocollis, mild retrocolis, and left shoulder elevation since 2000. She has good relief with regular Botox injection, about 4 times a year since 2003. Last injection was February 18, 2010 by Dr. Marta Antu,  She reported great relief as usual, taking 4- 5 days to get symptom relieve, lasting about 3 months.  I began to inject her since 12.2011, every 3-4 months, dosage has been limited to 150 units of BOTOX A.  She denies neck pain, gait difficulty, but with wearing off Botox, she noticed increased head tremor, and pulling towards her right shoulder.  She developed  transient difficulty lifting her neck up when using BOTOX A 200 units previously, reponding well to decreased dose of 150 units  She was enrolled into IPSEN dysport study, first injection was in April 30th 2013, she later enrolled into open label, 2nd injection was in  May 28th 2013. She did very well.  Recent few weeks,  She had experienced flareup of her rheumatoid arthritis, has received IV infusion. Last injection was in 06/10/2012,  500 units of dysport was dissolved into 2 cc of normal saline. Right longissimus capitus 0.5 cc Right splenius capitis 0.5 cc Right levator scapular 0.5 cc Right iliocostalis 0.5 cc   Dysport research study has completed, she is now coming back for repeat injection, she noticed head bobbing over the past few weeks, she denies significant neck pain, weakness,  Her rheumatoid arthritis is under better control   Last injection was in Sep 2014, she did very well, no neck extension weakness, only rarely neck shaking  UPDATE March 8th 2016: She has lost follow-up since her last visit December 2014,  She came in for treatment her cervical dystonia, she feel the tension of her neck all the time, stiffness. Denied gait difficulty, last injection was September 2014, she received 500 units of Dysport, works well for her, she is continue on medication for anxiety, rheumatoid arthritis She also complains of nighttime snoring, frequent awakening, excessive daytime sleepiness, fatigue, today's ESS is 6, FSS was 72,  UPDATE October 13 2014:   She has missed her scheduled sleep study, continue have worsening neck posturing, posterior neck pain, excessive daytime fatigue and sleepiness  UPDATE Nov 11 2014: She came in for EMG guided Dysport injection today, last injection was December 2014, she complains of worsening head titubation, bilateral hands mild postural tremor, posterior neck pain,  Update February 17 2015 She responded very well to last EMG guided Dysport injection Nov 11 2014, no significant head titubation, no significant neck pain  Update July 22 2015: Last injection was August, now she noticed returning of neck pulling and shaking, posterior neck muscle achy pain  Update October 21 2015: Last EMG guided Dysport injection was in January, she responded very well, only recent couple weeks, she noticed returning of neck pulling, shaking again, She has worsening rheumatoid arthritis, is receiving more frequent treatment, complains fatigue, diffuse body achy pain  Update March 15 2016: She responded very well to previous Dysport injection in April, no significant side effect noticed, only recent few weeks, 5 months from previous injection, she noticed return of head shaking neck muscle tightness.  UPDATE Dec 14th 2017: She responded very well  to previous injection in Sept 2017, no significant side effect noticed.  Update September 13 2016: She did well this last injection in December  Update January 15 2017: She noticed worsening head tremor, she had excessive stress, her sister passed away  from lung cancer in April 2018,  UPDATE Apr 25 2017: She responded very well with previous injection, barely has noticeable head shaking.  REVIEW OF SYSTEMS: Full 14 system review of systems performed and notable only for as above   ALLERGIES: No Known Allergies  HOME MEDICATIONS: Current Outpatient Prescriptions  Medication Sig Dispense Refill  . AbobotulinumtoxinA (DYSPORT) 500 units SOLR injection Inject 500 Units into the muscle every 3 (three) months. 1 each 3  . acetaminophen (TYLENOL) 325 MG tablet Take 650 mg by mouth once. One hour prior to infusion    . B-D TB SYRINGE 1CC/27GX1/2" 27G X 1/2" 1 ML MISC use 1 TB syringe once weekly 4 each 11  . baclofen (LIORESAL) 10 MG tablet Take 10 mg by mouth daily as needed for muscle spasms.     . clonazePAM (KLONOPIN) 0.5 MG tablet TAKE 1 TABLET BY MOUTH TWICE A DAY IF NEEDED FOR ANXIETY 60 tablet 5  . diphenhydrAMINE (BENADRYL) 25 mg capsule Take 25 mg by mouth once. One hour prior to infusion    . DULoxetine (CYMBALTA) 60 MG capsule take 1 capsule by mouth once daily 30 capsule 5  . folic acid (FOLVITE) 1 MG tablet Take 1 tablet (1 mg total) by mouth daily. 90 tablet 4  . inFLIXimab (REMICADE) 100 MG injection Inject 6 mg/kg into the vein every 6 (six) weeks. Infuse 500 mg/ 5 vials in 270mL NS over 2hours every 6 weeks    . methotrexate 50 MG/2ML injection INJECT 0.3 MILLILITERS INTO SKIN ONCE A WEEK 4 mL 0  . Multiple Vitamins-Minerals (MULTIVITAMINS THER. W/MINERALS) TABS Take 1 tablet by mouth daily.    . Omega-3 Fatty Acids (FISH OIL PO) Take 1,500 mg by mouth.    Marland Kitchen omeprazole (PRILOSEC) 20 MG capsule take 1 capsule by mouth once daily 30 capsule 11  . Probiotic Product (PROBIOTIC ADVANCED PO) Take by mouth.    . traMADol (ULTRAM) 50 MG tablet Take 1 tablet (50 mg total) by mouth every 6 (six) hours as needed. For pain 20 tablet 0  . Xylitol (XYLIMELTS MT) Use as directed in the mouth or throat.     No current facility-administered  medications for this visit.     PAST MEDICAL HISTORY: Past Medical History:  Diagnosis Date  . Anxiety   . Cervical dystonia   . Depression   . Fibromyalgia   . GERD (gastroesophageal reflux disease)   . History of shingles   . RA (rheumatoid arthritis) ([place])   . Synovitis of hand     PAST SURGICAL HISTORY: Past Surgical History:  Procedure Laterality Date  . TONSILLECTOMY    . VAGINAL HYSTERECTOMY  3/08    FAMILY HISTORY: Family History  Problem Relation Age of Onset  . Arthritis Mother   . Heart Problems Father   . Heart disease Father   . Heart disease Paternal Uncle        MI    SOCIAL HISTORY:  Social History   Social History  . Marital status: Married    Spouse name: [name]  . Number of children: 2  . Years of education: 12   Occupational History  .  [place] History Main Topics  . Smoking status:  Former Smoker    Packs/day: 0.50    Years: 30.00    Types: Cigarettes    Quit date: 05/02/2015  . Smokeless tobacco: Never Used  . Alcohol use 0.0 oz/week     Comment: Rare  . Drug use: No  . Sexual activity: Yes    Birth control/ protection: Surgical     Comment: 1st intercourse 56 yo-Fewer than 5 partners   Other Topics Concern  . 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 Right handed.    Caffeine- 4 cups daily.     PHYSICAL EXAM   Vitals:   04/25/17 1331  BP: 122/74  Pulse: 92  Weight: 178 lb (80.7 kg)  Height: 5\' 7"  (1.702 m)    Not recorded      Body mass index is 27.88 kg/m.  PHYSICAL EXAMNIATION: MENTAL STATUS: Speech is normal, normal to casual conversation, and history taking  CRANIAL NERVES: mild retrocollis, right tilt, mild right lateral shift, slight left shoulder elevation  MOTOR: Normal tone and bulk and strength COORDINATION: No dysmetria   GAIT/STANCE: Posture is  normal.  DIAGNOSTIC DATA (LABS, IMAGING, TESTING) - I reviewed patient records, labs, notes, testing and imaging myself where available.  ASSESSMENT AND PLAN  CHRISTYANN MANOLIS is a 56 y.o. female with long-standing history of cervical dystonia, responded very well to previous EMG guided Dysport injection, last injection was May 2016, responded very well.   Mild retrocollis, right tilt, mild right lateral shift, slight left shoulder elevation  500 of Dysport was dissolved into 2.5 cc of normal saline,  Right longissimus capitus 0. 5 cc Right scalenous posterior 0.5 cc Right inferior oblique capitis, 0.5 cc  Left inferior oblique capitis 0.5 cc Left levator scapular 0.5   Patient tolerate the injection well, will return to clinic in 3 months for repeat injection Please dissolve 500 units of Dysport into 2.5 cc of normal saline   Marcial Pacas, M.D. Ph.D.  [facility] [address], [place], [place] [address] Ph: [phone] Fax: [phone]

## 2017-04-26 ENCOUNTER — Ambulatory Visit (INDEPENDENT_AMBULATORY_CARE_PROVIDER_SITE_OTHER): Payer: 59

## 2017-04-26 ENCOUNTER — Ambulatory Visit (INDEPENDENT_AMBULATORY_CARE_PROVIDER_SITE_OTHER): Payer: Self-pay

## 2017-04-26 ENCOUNTER — Ambulatory Visit (INDEPENDENT_AMBULATORY_CARE_PROVIDER_SITE_OTHER): Payer: 59 | Admitting: Rheumatology

## 2017-04-26 ENCOUNTER — Encounter: Payer: Self-pay | Admitting: Rheumatology

## 2017-04-26 VITALS — BP 117/75 | HR 83 | Resp 12 | Ht 67.0 in | Wt 181.0 lb

## 2017-04-26 DIAGNOSIS — M79671 Pain in right foot: Secondary | ICD-10-CM

## 2017-04-26 DIAGNOSIS — M79641 Pain in right hand: Secondary | ICD-10-CM | POA: Diagnosis not present

## 2017-04-26 DIAGNOSIS — M797 Fibromyalgia: Secondary | ICD-10-CM

## 2017-04-26 DIAGNOSIS — Z79899 Other long term (current) drug therapy: Secondary | ICD-10-CM | POA: Diagnosis not present

## 2017-04-26 DIAGNOSIS — M79642 Pain in left hand: Secondary | ICD-10-CM

## 2017-04-26 DIAGNOSIS — M0609 Rheumatoid arthritis without rheumatoid factor, multiple sites: Secondary | ICD-10-CM

## 2017-04-26 DIAGNOSIS — F329 Major depressive disorder, single episode, unspecified: Secondary | ICD-10-CM

## 2017-04-26 DIAGNOSIS — F32A Depression, unspecified: Secondary | ICD-10-CM

## 2017-04-26 DIAGNOSIS — Z8719 Personal history of other diseases of the digestive system: Secondary | ICD-10-CM | POA: Diagnosis not present

## 2017-04-26 DIAGNOSIS — F419 Anxiety disorder, unspecified: Secondary | ICD-10-CM

## 2017-04-26 DIAGNOSIS — F5101 Primary insomnia: Secondary | ICD-10-CM

## 2017-04-27 ENCOUNTER — Ambulatory Visit (HOSPITAL_COMMUNITY)
Admission: RE | Admit: 2017-04-27 | Discharge: 2017-04-27 | Disposition: A | Discharge status: Home / Self Care | Payer: 59 | Source: Ambulatory Visit | Attending: Rheumatology | Admitting: Rheumatology

## 2017-04-27 DIAGNOSIS — Z79899 Other long term (current) drug therapy: Secondary | ICD-10-CM | POA: Diagnosis not present

## 2017-04-27 MED ORDER — SODIUM CHLORIDE 0.9 % IV SOLN
500.0000 mg | INTRAVENOUS | Status: DC
  Administered 2017-04-27: 500 mg via INTRAVENOUS
  Filled 2017-04-27: qty 50

## 2017-04-27 MED ORDER — SODIUM CHLORIDE 0.9 % IV SOLN
INTRAVENOUS | Status: DC
  Administered 2017-04-27: 09:00:00 via INTRAVENOUS

## 2017-04-27 MED ORDER — ACETAMINOPHEN 325 MG PO TABS: 650.0000 mg | ORAL_TABLET | ORAL | Status: DC

## 2017-04-27 MED ORDER — DIPHENHYDRAMINE HCL 25 MG PO CAPS: 25.0000 mg | ORAL_CAPSULE | ORAL | Status: DC

## 2017-06-06 ENCOUNTER — Other Ambulatory Visit: Payer: Self-pay

## 2017-06-06 ENCOUNTER — Encounter: Payer: Self-pay | Admitting: Physician Assistant

## 2017-06-06 ENCOUNTER — Ambulatory Visit: Payer: 59 | Admitting: Physician Assistant

## 2017-06-06 VITALS — BP 118/84 | HR 87 | Temp 98.2°F | Resp 14 | Ht 67.0 in | Wt 178.4 lb

## 2017-06-06 DIAGNOSIS — R300 Dysuria: Secondary | ICD-10-CM

## 2017-06-06 DIAGNOSIS — N390 Urinary tract infection, site not specified: Secondary | ICD-10-CM | POA: Diagnosis not present

## 2017-06-06 DIAGNOSIS — R319 Hematuria, unspecified: Secondary | ICD-10-CM | POA: Diagnosis not present

## 2017-06-06 LAB — URINALYSIS, ROUTINE W REFLEX MICROSCOPIC
Bilirubin Urine: NEGATIVE
Glucose, UA: NEGATIVE
Ketones, ur: NEGATIVE
Nitrite: NEGATIVE
Protein, ur: NEGATIVE
Specific Gravity, Urine: 1.01 (ref 1.001–1.03)
pH: 7 (ref 5.0–8.0)

## 2017-06-06 LAB — MICROSCOPIC MESSAGE

## 2017-06-06 MED ORDER — CIPROFLOXACIN HCL 500 MG PO TABS: 500.0000 mg | ORAL_TABLET | Freq: Two times a day (BID) | ORAL | 0 refills | Status: AC

## 2017-06-06 NOTE — Progress Notes (Signed)
Patient ID: Courtney Myers MRN: [id number], DOB: 19-Sep-1960, 56 y.o. Date of Encounter: 06/06/2017, 10:45 AM    Chief Complaint:  Chief Complaint  Patient presents with  . burning with urination    x2weeks  . bladder pressure    x2weeks      HPI: 56 y.o. year old female presents with above.   States that about 2 weeks ago she noticed that her urine looked cloudy and was foul-smelling.  Says that that part seemed to get better.  She started noticing some burning with urination and feeling some pressure.  Also has been waking up 1 time per night to urinate the last couple of nights which is unusual for her.  Discomfort is seeming to radiate towards the back.  Is a pressure/discomfort in her low mid abdomen/suprapubic region.  Been increasing her water intake.  Is been noticing no vaginal discharge.  No fevers or chills.     Home Meds:   Outpatient Medications Prior to Visit  Medication Sig Dispense Refill  . AbobotulinumtoxinA (DYSPORT) 500 units SOLR injection Inject 500 Units into the muscle every 3 (three) months. 1 each 3  . acetaminophen (TYLENOL) 325 MG tablet Take 650 mg by mouth once. One hour prior to infusion    . B-D TB SYRINGE 1CC/27GX1/2" 27G X 1/2" 1 ML MISC use 1 TB syringe once weekly 4 each 11  . baclofen (LIORESAL) 10 MG tablet Take 10 mg by mouth daily as needed for muscle spasms.     . clonazePAM (KLONOPIN) 0.5 MG tablet TAKE 1 TABLET BY MOUTH TWICE A DAY IF NEEDED FOR ANXIETY 60 tablet 5  . diphenhydrAMINE (BENADRYL) 25 mg capsule Take 25 mg by mouth once. One hour prior to infusion    . DULoxetine (CYMBALTA) 60 MG capsule take 1 capsule by mouth once daily 30 capsule 5  . folic acid (FOLVITE) 1 MG tablet Take 1 tablet (1 mg total) by mouth daily. 90 tablet 4  . inFLIXimab (REMICADE) 100 MG injection Inject 6 mg/kg into the vein every 6 (six) weeks. Infuse 500 mg/ 5 vials in 242mL NS over 2hours every 6 weeks    . methotrexate 50 MG/2ML injection INJECT 0.3  MILLILITERS INTO SKIN ONCE A WEEK 4 mL 0  . Multiple Vitamins-Minerals (MULTIVITAMINS THER. W/MINERALS) TABS Take 1 tablet by mouth daily.    Marland Kitchen omeprazole (PRILOSEC) 20 MG capsule take 1 capsule by mouth once daily 30 capsule 11  . Probiotic Product (PROBIOTIC ADVANCED PO) Take by mouth.    . traMADol (ULTRAM) 50 MG tablet Take 1 tablet (50 mg total) by mouth every 6 (six) hours as needed. For pain 20 tablet 0  . Xylitol (XYLIMELTS MT) Use as directed in the mouth or throat.    . Omega-3 Fatty Acids (FISH OIL PO) Take 1,500 mg by mouth.     No facility-administered medications prior to visit.     Allergies: No Known Allergies    Review of Systems: See HPI for pertinent ROS. All other ROS negative.    Physical Exam: Blood pressure 118/84, pulse 87, temperature 98.2 F (36.8 C), temperature source Oral, resp. rate 14, height 5\' 7"  (1.702 m), weight 80.9 kg (178 lb 6.4 oz), SpO2 99 %., Body mass index is 27.94 kg/m. General:  Appears in no acute distress. Neck: Supple. No thyromegaly. No lymphadenopathy. Lungs: Clear bilaterally to auscultation without wheezes, rales, or rhonchi. Breathing is unlabored. Heart: Regular rhythm. No murmurs, rubs, or gallops. Abdomen: Soft,  non-tender, non-distended with normoactive bowel sounds. No hepatomegaly. No rebound/guarding. No obvious abdominal masses.  No tenderness is produced with palpation of abdomen even in the lower abdomen. Msk:  Strength and tone normal for age.  No tenderness with percussion to costophrenic angles bilaterally. Extremities/Skin: Warm and dry.  Neuro: Alert and oriented X 3. Moves all extremities spontaneously. Gait is normal. CNII-XII grossly in tact. Psych:  Responds to questions appropriately with a normal affect.      ASSESSMENT AND PLAN:  56 y.o. year old female with  1. Urinary tract infection with hematuria, site unspecified Urinalysis does show UTI.  She is to take the Cipro as directed.  Follow-up if symptoms  worsen or do not resolve. - ciprofloxacin (CIPRO) 500 MG tablet; Take 1 tablet (500 mg total) by mouth 2 (two) times daily for 7 days.  Dispense: 14 tablet; Refill: 0  2. Burning with urination - Urinalysis, Routine w reflex microscopic   Signed, [address] [place], [place], [facility] 06/06/2017 10:45 AM

## 2017-06-07 ENCOUNTER — Telehealth: Payer: Self-pay

## 2017-06-07 NOTE — Telephone Encounter (Signed)
Called the patient to verify benefits for 2019. Pt currently received infusions that may require a pre-certification. Left message for patient to call back.  Chivon Lepage, [place], CPhT 3:00 PM

## 2017-06-20 DIAGNOSIS — G243 Spasmodic torticollis: Secondary | ICD-10-CM | POA: Diagnosis not present

## 2017-06-20 NOTE — Telephone Encounter (Signed)
Spoke with patient who states that her insurance plan for 2019 will remain the same. Will complete a BIV with current insurance plan and update.   Jaclynn Laumann, [place], CPhT 11:44 AM

## 2017-06-22 ENCOUNTER — Encounter (HOSPITAL_COMMUNITY): Payer: 59

## 2017-06-27 ENCOUNTER — Ambulatory Visit (HOSPITAL_COMMUNITY)
Admission: RE | Admit: 2017-06-27 | Discharge: 2017-06-27 | Disposition: A | Discharge status: Home / Self Care | Payer: 59 | Source: Ambulatory Visit | Attending: Rheumatology | Admitting: Rheumatology

## 2017-06-27 DIAGNOSIS — Z79899 Other long term (current) drug therapy: Secondary | ICD-10-CM | POA: Diagnosis not present

## 2017-06-27 DIAGNOSIS — M0609 Rheumatoid arthritis without rheumatoid factor, multiple sites: Secondary | ICD-10-CM | POA: Diagnosis not present

## 2017-06-27 LAB — COMPREHENSIVE METABOLIC PANEL
ALT: 24 U/L (ref 14–54)
AST: 26 U/L (ref 15–41)
Albumin: 3.8 g/dL (ref 3.5–5.0)
Alkaline Phosphatase: 90 U/L (ref 38–126)
Anion gap: 9 (ref 5–15)
BUN: 10 mg/dL (ref 6–20)
CO2: 23 mmol/L (ref 22–32)
Calcium: 9 mg/dL (ref 8.9–10.3)
Chloride: 104 mmol/L (ref 101–111)
Creatinine, Ser: 0.74 mg/dL (ref 0.44–1.00)
GFR calc Af Amer: >60 mL/min (ref >60)
GFR calc non Af Amer: >60 mL/min (ref >60)
Glucose, Bld: 109 mg/dL — ABNORMAL HIGH (ref 65–99)
Potassium: 3.7 mmol/L (ref 3.5–5.1)
Sodium: 136 mmol/L (ref 135–145)
Total Bilirubin: 0.5 mg/dL (ref 0.3–1.2)
Total Protein: 7.3 g/dL (ref 6.5–8.1)

## 2017-06-27 LAB — CBC WITH DIFFERENTIAL/PLATELET
Basophils Absolute: 0.1 10*3/uL (ref 0.0–0.1)
Basophils Relative: 1 %
Eosinophils Absolute: 0.2 10*3/uL (ref 0.0–0.7)
Eosinophils Relative: 3 %
HCT: 39.5 % (ref 36.0–46.0)
Hemoglobin: 13.3 g/dL (ref 12.0–15.0)
Lymphocytes Relative: 29 %
Lymphs Abs: 2.3 10*3/uL (ref 0.7–4.0)
MCH: 30.6 pg (ref 26.0–34.0)
MCHC: 33.7 g/dL (ref 30.0–36.0)
MCV: 90.8 fL (ref 78.0–100.0)
Monocytes Absolute: 0.7 10*3/uL (ref 0.1–1.0)
Monocytes Relative: 9 %
Neutro Abs: 4.6 10*3/uL (ref 1.7–7.7)
Neutrophils Relative %: 58 %
Platelets: 234 10*3/uL (ref 150–400)
RBC: 4.35 MIL/uL (ref 3.87–5.11)
RDW: 14.2 % (ref 11.5–15.5)
WBC: 7.9 10*3/uL (ref 4.0–10.5)

## 2017-06-27 MED ORDER — DIPHENHYDRAMINE HCL 25 MG PO CAPS: 25.0000 mg | ORAL_CAPSULE | ORAL | Status: DC

## 2017-06-27 MED ORDER — SODIUM CHLORIDE 0.9 % IV SOLN
INTRAVENOUS | Status: DC
  Administered 2017-06-27: 09:00:00 via INTRAVENOUS

## 2017-06-27 MED ORDER — ACETAMINOPHEN 325 MG PO TABS: 650.0000 mg | ORAL_TABLET | ORAL | Status: DC

## 2017-06-27 MED ORDER — SODIUM CHLORIDE 0.9 % IV SOLN
500.0000 mg | INTRAVENOUS | Status: DC
  Administered 2017-06-27: 500 mg via INTRAVENOUS
  Filled 2017-06-27: qty 50

## 2017-06-27 NOTE — Progress Notes (Signed)
WNL

## 2017-07-02 ENCOUNTER — Other Ambulatory Visit: Payer: Self-pay | Admitting: *Deleted

## 2017-07-02 MED ORDER — DULOXETINE HCL 60 MG PO CPEP: 60.0000 mg | ORAL_CAPSULE | Freq: Every day | ORAL | 5 refills | Status: DC

## 2017-07-02 NOTE — Telephone Encounter (Signed)
Refill request received via fax  Last Visit: 04/26/17 Next Visit: 09/27/17  Okay to refill per Dr. Estanislado Pandy

## 2017-07-05 NOTE — Telephone Encounter (Signed)
Called pts insurance [facility]) to get authorization for infusion services. Spoke with Thurmond Butts who was able to process the request. [facility] is in network and patient has been approved from 07/04/2017 through 07/05/2018 for 10 visits.   Authorization number: [id number] Phone: [phone]  An approval letter will be mailed to the clinic. Will send document to scan center once received.   Jamar Casagrande, [place], CPhT 10:34 AM

## 2017-07-23 ENCOUNTER — Telehealth: Payer: Self-pay | Admitting: Rheumatology

## 2017-07-23 NOTE — Telephone Encounter (Signed)
Patient left a voicemail requesting a prescription refill for Tramadol.  CB# [phone]

## 2017-07-24 NOTE — Telephone Encounter (Signed)
Attempted to contact the patient and left message for patient to call the office.  

## 2017-07-24 NOTE — Telephone Encounter (Signed)
Patient is requesting a prpescription for Tramadol. We have not prescribed this medication since 2014. Do you want to prescribe this for this patient again if she updates her UDS and narcotic agreement?

## 2017-07-24 NOTE — Telephone Encounter (Signed)
We can refer her to pain clinic. We are rxing Tramadol.

## 2017-07-25 ENCOUNTER — Telehealth: Payer: Self-pay | Admitting: Rheumatology

## 2017-07-25 NOTE — Telephone Encounter (Signed)
Patient advised that we would be able to refer her to pain management but would be unable to prescribe Tramadol. Patient states she does not take it often enough o warrant a referral to pain management.

## 2017-07-25 NOTE — Telephone Encounter (Signed)
Patient left a voicemail returning your call.  CB# [phone]

## 2017-08-01 ENCOUNTER — Ambulatory Visit: Payer: 59 | Admitting: Neurology

## 2017-08-01 ENCOUNTER — Encounter: Payer: Self-pay | Admitting: Neurology

## 2017-08-01 VITALS — BP 115/71 | HR 106 | Ht 67.0 in | Wt 182.0 lb

## 2017-08-01 DIAGNOSIS — G243 Spasmodic torticollis: Secondary | ICD-10-CM | POA: Diagnosis not present

## 2017-08-01 NOTE — Progress Notes (Signed)
**  Dysport 500 units x 1 vial, [place] [address]-2, Lot [id number], Exp 12/30/2017, specialty pharmacy.//mck,rn**

## 2017-08-01 NOTE — Progress Notes (Signed)
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  Symptom onset was without apparent triggering event, she has gradually developed this neck pulling,  manifested primarily with right laterocollis, mild retrocolis, and left shoulder elevation since 2000. She has good relief with regular Botox injection, about 4 times a year since 2003. Last injection was February 18, 2010 by Dr. Marta Antu,  She reported great relief as usual, taking 4- 5 days to get symptom relieve, lasting about 3 months.  I began to inject her since 12.2011, every 3-4 months, dosage has been limited to 150 units of BOTOX A.  She denies neck pain, gait difficulty, but with wearing off Botox, she noticed increased head tremor, and pulling towards her right shoulder.  She developed  transient difficulty lifting her neck up when using BOTOX A 200 units previously, reponding well to decreased dose of 150 units  She was enrolled into IPSEN dysport study, first injection was in April 30th 2013, she later enrolled into open label, 2nd injection was in  May 28th 2013. She did very well.  Recent few weeks,  She had experienced flareup of her rheumatoid arthritis, has received IV infusion. Last injection was in 06/10/2012,  500 units of dysport was dissolved into 2 cc of normal saline. Right longissimus capitus 0.5 cc Right splenius capitis 0.5 cc Right levator scapular 0.5 cc Right iliocostalis 0.5 cc   Dysport research study has completed, she is now coming back for repeat injection, she noticed head bobbing over the past few weeks, she denies significant neck pain, weakness,  Her rheumatoid arthritis is under better control   Last injection was in Sep 2014, she did very well, no neck extension weakness, only rarely neck shaking  UPDATE March 8th 2016: She has lost follow-up since her last visit December 2014,  She came in for treatment her cervical dystonia, she feel the tension of her neck all the time, stiffness. Denied gait difficulty, last injection was September 2014, she received 500 units of Dysport, works well for her, she is continue on medication for anxiety, rheumatoid arthritis She also complains of nighttime snoring, frequent awakening, excessive daytime sleepiness, fatigue, today's ESS is 6, FSS was 72,  UPDATE October 13 2014:   She has missed her scheduled sleep study, continue have worsening neck posturing, posterior neck pain, excessive daytime fatigue and sleepiness  UPDATE Nov 11 2014: She came in for EMG guided Dysport injection today, last injection was December 2014, she complains of worsening head titubation, bilateral hands mild postural tremor, posterior neck pain,  Update February 17 2015 She responded very well to last EMG guided Dysport injection Nov 11 2014, no significant head titubation, no significant neck pain  Update July 22 2015: Last injection was August, now she noticed returning of neck pulling and shaking, posterior neck muscle achy pain  Update October 21 2015: Last EMG guided Dysport injection was in January, she responded very well, only recent couple weeks, she noticed returning of neck pulling, shaking again, She has worsening rheumatoid arthritis, is receiving more frequent treatment, complains fatigue, diffuse body achy pain  Update March 15 2016: She responded very well to previous Dysport injection in April, no significant side effect noticed, only recent few weeks, 5 months from previous injection, she noticed return of head shaking neck muscle tightness.  UPDATE Dec 14th 2017: She responded very well  to previous injection in Sept 2017, no significant side effect noticed.  Update September 13 2016: She did well this last injection in December  Update January 15 2017: She noticed worsening head tremor, she had excessive stress, her sister passed away  from lung cancer in April 2018,  UPDATE Apr 25 2017: She responded very well with previous injection, barely has noticeable head shaking.  Update August 01, 2017: She responded very well to previous dysport injection, only recently began to have recurrent head shaking  REVIEW OF SYSTEMS: Full 14 system review of systems performed and notable only for as above   ALLERGIES: No Known Allergies  HOME MEDICATIONS: Current Outpatient Medications  Medication Sig Dispense Refill  . AbobotulinumtoxinA (DYSPORT) 500 units SOLR injection Inject 500 Units into the muscle every 3 (three) months. 1 each 3  . acetaminophen (TYLENOL) 325 MG tablet Take 650 mg by mouth once. One hour prior to infusion    . B-D TB SYRINGE 1CC/27GX1/2" 27G X 1/2" 1 ML MISC use 1 TB syringe once weekly 4 each 11  . baclofen (LIORESAL) 10 MG tablet Take 10 mg by mouth daily as needed for muscle spasms.     . clonazePAM (KLONOPIN) 0.5 MG tablet TAKE 1 TABLET BY MOUTH TWICE A DAY IF NEEDED FOR ANXIETY 60 tablet 5  . diphenhydrAMINE (BENADRYL) 25 mg capsule Take 25 mg by mouth once. One hour prior to infusion    . DULoxetine (CYMBALTA) 60 MG capsule Take 1 capsule (60 mg total) by mouth daily. 30 capsule 5  . folic acid (FOLVITE) 1 MG tablet Take 1 tablet (1 mg total) by mouth daily. 90 tablet 4  . inFLIXimab (REMICADE) 100 MG injection Inject 6 mg/kg into the vein every 6 (six) weeks. Infuse 500 mg/ 5 vials in 270mL NS over 2hours every 6 weeks    . methotrexate 50 MG/2ML injection INJECT 0.3 MILLILITERS INTO SKIN ONCE A WEEK 4 mL 0  . Multiple Vitamins-Minerals (MULTIVITAMINS THER. W/MINERALS) TABS Take 1 tablet by mouth daily.    Marland Kitchen omeprazole (PRILOSEC) 20 MG capsule take 1 capsule by mouth once daily 30 capsule 11  . Probiotic Product (PROBIOTIC ADVANCED PO) Take by mouth.    . traMADol (ULTRAM) 50 MG tablet Take 1 tablet (50 mg total) by mouth every 6 (six) hours as needed. For pain 20 tablet 0  . Xylitol (XYLIMELTS MT)  Use as directed in the mouth or throat.     No current facility-administered medications for this visit.     PAST MEDICAL HISTORY: Past Medical History:  Diagnosis Date  . Anxiety   . Cervical dystonia   . Depression   . Fibromyalgia   . GERD (gastroesophageal reflux disease)   . History of shingles   . RA (rheumatoid arthritis) ([place])   . Synovitis of hand     PAST SURGICAL HISTORY: Past Surgical History:  Procedure Laterality Date  . TONSILLECTOMY    . VAGINAL HYSTERECTOMY  3/08    FAMILY HISTORY: Family History  Problem Relation Age of Onset  . Arthritis Mother   . Heart Problems Father   . Heart disease Father   . Heart disease Paternal Uncle        MI    SOCIAL HISTORY:  Social History   Socioeconomic History  . Marital status: Married    Spouse name: [name]  . Number of children: 2  . Years of education: 81  . Highest education level: Not on file  Social Needs  .  Financial resource strain: Not on file  . Food insecurity - worry: Not on file  . Food insecurity - inability: Not on file  . Transportation needs - medical: Not on file  . Transportation needs - non-medical: Not on file  Occupational History    Employer: [employer]  Tobacco Use  . Smoking status: Former Smoker    Packs/day: 0.50    Years: 30.00    Pack years: 15.00    Types: Cigarettes    Last attempt to quit: 05/02/2015    Years since quitting: 2.2  . Smokeless tobacco: Never Used  Substance and Sexual Activity  . Alcohol use: Yes    Alcohol/week: 0.0 oz    Comment: Rare  . Drug use: No  . Sexual activity: Yes    Birth control/protection: Surgical    Comment: 1st intercourse 57 yo-Fewer than 5 partners  Other Topics Concern  . 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 Right handed.    Caffeine- 4 cups daily.     PHYSICAL EXAM   Vitals:   08/01/17  1451  BP: 115/71  Pulse: (!) 106  Weight: 182 lb (82.6 kg)  Height: 5\' 7"  (1.702 m)    Not recorded      Body mass index is 28.51 kg/m.  PHYSICAL EXAMNIATION: MENTAL STATUS: Speech is normal, normal to casual conversation, and history taking  CRANIAL NERVES: mild retrocollis, right tilt, mild right lateral shift, slight left shoulder elevation  MOTOR: Normal tone and bulk and strength COORDINATION: No dysmetria   GAIT/STANCE: Posture is normal.  DIAGNOSTIC DATA (LABS, IMAGING, TESTING) - I reviewed patient records, labs, notes, testing and imaging myself where available.  ASSESSMENT AND PLAN  Courtney Myers is a 57 y.o. female with long-standing history of cervical dystonia, responded very well to previous EMG guided Dysport injection, last injection was May 2016, responded very well.   Mild retrocollis, right tilt, mild right lateral shift, slight left shoulder elevation  500 of Dysport was dissolved into 2.5 cc of normal saline,  Right longissimus capitus 0. 5 cc Left splenius capitis 0.5 cc Right inferior oblique capitis, 0.5 cc  Left inferior oblique capitis 0.5 cc Left levator scapular 0.5   Patient tolerate the injection well, will return to clinic in 3 months for repeat injection Please dissolve 500 units of Dysport into 2.5 cc of normal saline   Marcial Pacas, M.D. Ph.D.  [facility] [address], [place], [place] [address] Ph: [phone] Fax: [phone]

## 2017-08-02 ENCOUNTER — Other Ambulatory Visit: Payer: Self-pay | Admitting: *Deleted

## 2017-08-02 NOTE — Progress Notes (Signed)
Infusion orders are current for patient CBC CMP Tylenol Benadryl appointments are up to date and follow up appointment  is scheduled TB gold not due yet.  

## 2017-08-09 ENCOUNTER — Telehealth: Payer: Self-pay | Admitting: Rheumatology

## 2017-08-09 ENCOUNTER — Ambulatory Visit (HOSPITAL_COMMUNITY)
Admission: RE | Admit: 2017-08-09 | Discharge: 2017-08-09 | Disposition: A | Discharge status: Home / Self Care | Payer: 59 | Source: Ambulatory Visit | Attending: Rheumatology | Admitting: Rheumatology

## 2017-08-09 DIAGNOSIS — M069 Rheumatoid arthritis, unspecified: Secondary | ICD-10-CM | POA: Diagnosis not present

## 2017-08-09 DIAGNOSIS — Z79899 Other long term (current) drug therapy: Secondary | ICD-10-CM

## 2017-08-09 LAB — COMPREHENSIVE METABOLIC PANEL
ALT: 24 U/L (ref 14–54)
AST: 25 U/L (ref 15–41)
Albumin: 3.8 g/dL (ref 3.5–5.0)
Alkaline Phosphatase: 85 U/L (ref 38–126)
Anion gap: 12 (ref 5–15)
BUN: 8 mg/dL (ref 6–20)
CO2: 22 mmol/L (ref 22–32)
Calcium: 9.2 mg/dL (ref 8.9–10.3)
Chloride: 101 mmol/L (ref 101–111)
Creatinine, Ser: 0.84 mg/dL (ref 0.44–1.00)
GFR calc Af Amer: >60 mL/min (ref >60)
GFR calc non Af Amer: >60 mL/min (ref >60)
Glucose, Bld: 105 mg/dL — ABNORMAL HIGH (ref 65–99)
Potassium: 3.8 mmol/L (ref 3.5–5.1)
Sodium: 135 mmol/L (ref 135–145)
Total Bilirubin: 0.5 mg/dL (ref 0.3–1.2)
Total Protein: 7.2 g/dL (ref 6.5–8.1)

## 2017-08-09 LAB — CBC WITH DIFFERENTIAL/PLATELET
Basophils Absolute: 0.1 10*3/uL (ref 0.0–0.1)
Basophils Relative: 1 %
Eosinophils Absolute: 0.2 10*3/uL (ref 0.0–0.7)
Eosinophils Relative: 1 %
HCT: 42.5 % (ref 36.0–46.0)
Hemoglobin: 14.1 g/dL (ref 12.0–15.0)
Lymphocytes Relative: 25 %
Lymphs Abs: 2.9 10*3/uL (ref 0.7–4.0)
MCH: 30.9 pg (ref 26.0–34.0)
MCHC: 33.2 g/dL (ref 30.0–36.0)
MCV: 93 fL (ref 78.0–100.0)
Monocytes Absolute: 0.7 10*3/uL (ref 0.1–1.0)
Monocytes Relative: 6 %
Neutro Abs: 7.9 10*3/uL — ABNORMAL HIGH (ref 1.7–7.7)
Neutrophils Relative %: 67 %
Platelets: 265 10*3/uL (ref 150–400)
RBC: 4.57 MIL/uL (ref 3.87–5.11)
RDW: 14.5 % (ref 11.5–15.5)
WBC: 11.8 10*3/uL — ABNORMAL HIGH (ref 4.0–10.5)

## 2017-08-09 MED ORDER — ACETAMINOPHEN 325 MG PO TABS: 650.0000 mg | ORAL_TABLET | ORAL | Status: DC

## 2017-08-09 MED ORDER — SODIUM CHLORIDE 0.9 % IV SOLN
INTRAVENOUS | Status: DC
  Administered 2017-08-09: 09:00:00 via INTRAVENOUS

## 2017-08-09 MED ORDER — SODIUM CHLORIDE 0.9 % IV SOLN
500.0000 mg | INTRAVENOUS | Status: DC
  Administered 2017-08-09: 500 mg via INTRAVENOUS
  Filled 2017-08-09: qty 50

## 2017-08-09 MED ORDER — DIPHENHYDRAMINE HCL 25 MG PO CAPS: 25.0000 mg | ORAL_CAPSULE | ORAL | Status: DC

## 2017-08-09 NOTE — Telephone Encounter (Signed)
Patient left a voicemail stating that she was calling about her lab results.

## 2017-08-09 NOTE — Telephone Encounter (Signed)
Returned patient's call and advised patient of lab results, patient verbalized understanding.

## 2017-08-13 ENCOUNTER — Other Ambulatory Visit: Payer: Self-pay

## 2017-08-13 ENCOUNTER — Encounter: Payer: Self-pay | Admitting: Physician Assistant

## 2017-08-13 ENCOUNTER — Ambulatory Visit: Payer: 59 | Admitting: Physician Assistant

## 2017-08-13 VITALS — BP 114/70 | HR 103 | Temp 97.6°F | Resp 16 | Wt 182.2 lb

## 2017-08-13 DIAGNOSIS — N390 Urinary tract infection, site not specified: Secondary | ICD-10-CM

## 2017-08-13 DIAGNOSIS — R319 Hematuria, unspecified: Secondary | ICD-10-CM | POA: Diagnosis not present

## 2017-08-13 DIAGNOSIS — R300 Dysuria: Secondary | ICD-10-CM

## 2017-08-13 LAB — URINALYSIS, ROUTINE W REFLEX MICROSCOPIC
Bilirubin Urine: NEGATIVE
Glucose, UA: NEGATIVE
Ketones, ur: NEGATIVE
Nitrite: NEGATIVE
Protein, ur: NEGATIVE
Specific Gravity, Urine: 1.003 (ref 1.001–1.03)
Squamous Epithelial / LPF: NONE SEEN /HPF (ref <=5)
pH: 5.5 (ref 5.0–8.0)

## 2017-08-13 LAB — MICROSCOPIC MESSAGE

## 2017-08-13 MED ORDER — CIPROFLOXACIN HCL 500 MG PO TABS: 500.0000 mg | ORAL_TABLET | Freq: Two times a day (BID) | ORAL | 0 refills | Status: AC

## 2017-08-13 NOTE — Progress Notes (Signed)
Patient ID: Courtney Myers MRN: [id number], DOB: 1961/03/09, 57 y.o. Date of Encounter: 08/13/2017, 2:39 PM    Chief Complaint:  Chief Complaint  Patient presents with  . Fatigue  . urine odor  . Dysuria  . Back Pain     HPI: 57 y.o. year old female presents with above.   Reports that she has rheumatoid arthritis and fibromyalgia so she always has some fatigue but says that the last several days she has felt more rundown than usual.  Says that she has some symptoms that feels similar to prior UTI.  Is having some burning with urination and has noticed some odor with urination.  No known fevers or chills.  Also notes that Dr. Estanislado Pandy recently did some lab work and when they called her to review results said that her white count was a little high and asked if she had any recent infection. States that she has had no cough no chest congestion no head or nasal congestion is not blowing any mucus from the nose.  Has had no sore throat. Reports that she has had some loose stool yesterday and today.  Just about 2 episodes per day.  Has had no vomiting.  Has had no abdominal pain.  Had no known fevers or chills.  No other areas of pain or sites of infection.  Home Meds:   Outpatient Medications Prior to Visit  Medication Sig Dispense Refill  . AbobotulinumtoxinA (DYSPORT) 500 units SOLR injection Inject 500 Units into the muscle every 3 (three) months. 1 each 3  . acetaminophen (TYLENOL) 325 MG tablet Take 650 mg by mouth once. One hour prior to infusion    . B-D TB SYRINGE 1CC/27GX1/2" 27G X 1/2" 1 ML MISC use 1 TB syringe once weekly 4 each 11  . baclofen (LIORESAL) 10 MG tablet Take 10 mg by mouth daily as needed for muscle spasms.     . clonazePAM (KLONOPIN) 0.5 MG tablet TAKE 1 TABLET BY MOUTH TWICE A DAY IF NEEDED FOR ANXIETY 60 tablet 5  . diphenhydrAMINE (BENADRYL) 25 mg capsule Take 25 mg by mouth once. One hour prior to infusion    . DULoxetine (CYMBALTA) 60 MG capsule Take  1 capsule (60 mg total) by mouth daily. 30 capsule 5  . folic acid (FOLVITE) 1 MG tablet Take 1 tablet (1 mg total) by mouth daily. 90 tablet 4  . inFLIXimab (REMICADE) 100 MG injection Inject 6 mg/kg into the vein every 6 (six) weeks. Infuse 500 mg/ 5 vials in 281mL NS over 2hours every 6 weeks    . methotrexate 50 MG/2ML injection INJECT 0.3 MILLILITERS INTO SKIN ONCE A WEEK 4 mL 0  . Multiple Vitamins-Minerals (MULTIVITAMINS THER. W/MINERALS) TABS Take 1 tablet by mouth daily.    Marland Kitchen omeprazole (PRILOSEC) 20 MG capsule take 1 capsule by mouth once daily 30 capsule 11  . Probiotic Product (PROBIOTIC ADVANCED PO) Take by mouth.    . Xylitol (XYLIMELTS MT) Use as directed in the mouth or throat.    . traMADol (ULTRAM) 50 MG tablet Take 1 tablet (50 mg total) by mouth every 6 (six) hours as needed. For pain 20 tablet 0   No facility-administered medications prior to visit.     Allergies: No Known Allergies    Review of Systems: See HPI for pertinent ROS. All other ROS negative.    Physical Exam: Blood pressure 114/70, pulse (!) 103, temperature 97.6 F (36.4 C), temperature source Oral, resp. rate 16,  weight 82.6 kg (182 lb 3.2 oz), SpO2 98 %., Body mass index is 28.54 kg/m. General:  WNWD WF. Appears in no acute distress. HEENT: Normocephalic, atraumatic, eyes without discharge, sclera non-icteric, nares are without discharge. Bilateral auditory canals clear, TM's are without perforation, pearly grey and translucent with reflective cone of light bilaterally. Oral cavity moist, posterior pharynx without exudate, erythema, peritonsillar abscess, or post nasal drip.  Neck: Supple. No thyromegaly. No lymphadenopathy. Lungs: Clear bilaterally to auscultation without wheezes, rales, or rhonchi. Breathing is unlabored. Heart: Regular rhythm. No murmurs, rubs, or gallops. Abdomen: Soft, non-tender, non-distended with normoactive bowel sounds. No hepatomegaly. No rebound/guarding. No obvious  abdominal masses. Msk:  Strength and tone normal for age.  No costophrenic angle tenderness with percussion bilaterally. Extremities/Skin: Warm and dry.  Neuro: Alert and oriented X 3. Moves all extremities spontaneously. Gait is normal. CNII-XII grossly in tact. Psych:  Responds to questions appropriately with a normal affect.   Results for orders placed or performed in visit on 08/13/17  Urinalysis, Routine w reflex microscopic  Result Value Ref Range   Color, Urine YELLOW YELLOW   APPearance CLEAR CLEAR   Specific Gravity, Urine 1.003 1.001 - 1.03   pH 5.5 5.0 - 8.0   Glucose, UA NEGATIVE NEGATIVE   Bilirubin Urine NEGATIVE NEGATIVE   Ketones, ur NEGATIVE NEGATIVE   Hgb urine dipstick 2+ (A) NEGATIVE   Protein, ur NEGATIVE NEGATIVE   Nitrite NEGATIVE NEGATIVE   Leukocytes, UA 1+ (A) NEGATIVE   WBC, UA 6-10 (A) 0 - 5 /HPF   RBC / HPF 3-10 (A) 0 - 2 /HPF   Squamous Epithelial / LPF NONE SEEN < OR = 5 /HPF   Bacteria, UA FEW (A) NONE SEEN /HPF  Microscopic Message  Result Value Ref Range   Note       ASSESSMENT AND PLAN:  57 y.o. year old female with  1. Urinary tract infection with hematuria, site unspecified Urinalysis is consistent with UTI.  She is to take the Cipro as directed.  As well I will send culture for confirmation. Also discussed her loose stools and discussed that she may also have a mild viral gastritis going on.  Recommend that she stick with clear liquid diet and may be plain crackers but no other heavy food and to take Imodium to get this diarrhea controlled before she starts antibiotics.  Otherwise antibiotics may make this worse and also she will not be absorbing the antibiotics if she continues with this diarrhea while on the antibiotic.  Follow-up if needed. - Urine Culture - ciprofloxacin (CIPRO) 500 MG tablet; Take 1 tablet (500 mg total) by mouth 2 (two) times daily for 7 days.  Dispense: 14 tablet; Refill: 0  2. Dysuria - Urinalysis, Routine w reflex  microscopic   Signed, [address] [place], [place], [facility] 08/13/2017 2:39 PM

## 2017-08-14 ENCOUNTER — Other Ambulatory Visit: Payer: Self-pay | Admitting: *Deleted

## 2017-08-14 MED ORDER — FOLIC ACID 1 MG PO TABS: 1.0000 mg | ORAL_TABLET | Freq: Every day | ORAL | 4 refills | Status: DC

## 2017-08-14 NOTE — Telephone Encounter (Signed)
Refill request received via fax  Last Visit: 04/26/17 Next Visit: 09/27/17  Okay to refill per Dr. Estanislado Pandy

## 2017-08-15 LAB — URINE CULTURE
MICRO NUMBER:: 90181380
SPECIMEN QUALITY:: ADEQUATE

## 2017-08-27 ENCOUNTER — Encounter: Payer: Self-pay | Admitting: Rheumatology

## 2017-08-28 MED ORDER — METHOTREXATE SODIUM CHEMO INJECTION 50 MG/2ML: INTRAMUSCULAR | 0 refills | Status: DC

## 2017-08-28 NOTE — Telephone Encounter (Signed)
Last Visit: 04/26/17 Next Visit: 09/27/17 Labs: 08/09/17 glucose elevated all other labs WNL  Okay to refill per Dr. Estanislado Pandy

## 2017-08-30 ENCOUNTER — Other Ambulatory Visit: Payer: Self-pay | Admitting: Family Medicine

## 2017-08-30 DIAGNOSIS — K219 Gastro-esophageal reflux disease without esophagitis: Secondary | ICD-10-CM

## 2017-09-05 ENCOUNTER — Other Ambulatory Visit: Payer: Self-pay | Admitting: Neurology

## 2017-09-05 ENCOUNTER — Other Ambulatory Visit: Payer: Self-pay | Admitting: *Deleted

## 2017-09-05 DIAGNOSIS — M0609 Rheumatoid arthritis without rheumatoid factor, multiple sites: Secondary | ICD-10-CM

## 2017-09-05 NOTE — Telephone Encounter (Signed)
Faxed printed/signed rx clonazepam to [facility] at [phone]. Received fax confirmation.

## 2017-09-14 NOTE — Progress Notes (Signed)
Office Visit Note  Patient: Courtney Myers             Date of Birth: 1961-04-21           MRN: [id number]             PCP: Courtney Myers Referring: Courtney Sheldon, PA-C Visit Date: 09/27/2017 Occupation: @GUAROCC @    Subjective:  Generalized pain    History of Present Illness: Courtney Myers is a 57 y.o. female with history of seronegative rheumatoid arthritis and fibromyalgia.  Patient states she continues to get her Remicade infusions every 6 weeks and takes methotrexate 0.3 mL subcu every week.  She states that she has not had any recent flares of her rheumatoid arthritis.  She denies any joint pain or joint swelling at this time. She states that her fibromyalgia has been flaring over the past few months.  She states she is been having increased muscle tenderness and muscle aches.  She states she is also having increased anxiety depression and worsening fatigue.  She states she is also having very interrupted sleep at night.  She states that she has been sick several times this winter and has having more stress at work which is leading to increased generalized pain.  She states she continues to take Cymbalta 60 mg daily.  She takes Klonopin 0.5 mg in the morning and at bedtime to help with her anxiety.  She states she very rarely takes baclofen unless she is having significant muscle spasms.  Activities of Daily Living:  Patient reports morning stiffness for 15 minutes.   Patient Denies nocturnal pain.  Difficulty dressing/grooming: Denies Difficulty climbing stairs: Denies Difficulty getting out of chair: Reports Difficulty using hands for taps, buttons, cutlery, and/or writing: Denies   Review of Systems  Constitutional: Positive for fatigue.  HENT: Positive for mouth dryness. Negative for mouth sores and nose dryness.   Eyes: Positive for dryness. Negative for pain and visual disturbance.  Respiratory: Positive for shortness of breath. Negative for cough, hemoptysis and  difficulty breathing.   Cardiovascular: Negative for chest pain, palpitations, hypertension and swelling in legs/feet.  Gastrointestinal: Negative for abdominal pain, blood in stool, constipation and diarrhea.  Endocrine: Negative for increased urination.  Genitourinary: Negative for painful urination and pelvic pain.  Musculoskeletal: Positive for arthralgias, joint pain, joint swelling and morning stiffness. Negative for myalgias, muscle weakness, muscle tenderness and myalgias.  Skin: Negative for color change, pallor, rash, hair loss, nodules/bumps, skin tightness, ulcers and sensitivity to sunlight.  Allergic/Immunologic: Positive for susceptible to infections.  Neurological: Positive for dizziness, memory loss and weakness. Negative for numbness and headaches.  Hematological: Negative for bruising/bleeding tendency and swollen glands.  Psychiatric/Behavioral: Positive for confusion. Negative for depressed mood and sleep disturbance. The patient is not nervous/anxious.     PMFS History:  Patient Active Problem List   Diagnosis Date Noted  . Excessive sleepiness 09/08/2014  . Fibromyalgia syndrome 08/25/2014  . RA (rheumatoid arthritis) ([place])   . GERD (gastroesophageal reflux disease)   . Anxiety   . Depression   . History of shingles   . Synovitis of hand   . Cervical dystonia 12/04/2012    Past Medical History:  Diagnosis Date  . Anxiety   . Cervical dystonia   . Depression   . Fibromyalgia   . GERD (gastroesophageal reflux disease)   . History of shingles   . RA (rheumatoid arthritis) ([place])   . Synovitis of hand  Family History  Problem Relation Age of Onset  . Arthritis Mother   . Heart Problems Father   . Heart disease Father   . Heart disease Paternal Uncle        MI   Past Surgical History:  Procedure Laterality Date  . TONSILLECTOMY    . 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 Right handed.    Caffeine- 4 cups daily.     Objective: Vital Signs: BP 121/81 (BP Location: Left Arm, Patient Position: Sitting, Cuff Size: Normal)   Pulse 83   Resp 16   Ht 5\' 7"  (1.702 m)   Wt 183 lb (83 kg)   BMI 28.66 kg/m    Physical Exam  Constitutional: She is oriented to person, place, and time. She appears well-developed and well-nourished.  HENT:  Head: Normocephalic and atraumatic.  Eyes: Conjunctivae and EOM are normal.  Neck: Normal range of motion.  Cardiovascular: Normal rate, regular rhythm, normal heart sounds and intact distal pulses.  Pulmonary/Chest: Effort normal and breath sounds normal.  Abdominal: Soft. Bowel sounds are normal.  Lymphadenopathy:    She has no cervical adenopathy.  Neurological: She is alert and oriented to person, place, and time.  Skin: Skin is warm and dry. Capillary refill takes less than 2 seconds.  Psychiatric: She has a normal mood and affect. Her behavior is normal.  Nursing note and vitals reviewed.    Musculoskeletal Exam: C-spine, thoracic spine, lumbar spine good range of motion.  No midline spinal tenderness.  Shoulder joints, elbow joints, wrist joints, MCPs, PIPs, DIPs good range of motion with no synovitis.  Hip joints, knee joints, ankle joints, MTPs, PIPs, DIPs good range of motion with no synovitis.  No warmth or effusion of bilateral knees.  She has hyperextension of bilateral knees.  No tenderness of trochanteric bursa.  CDAI Exam: CDAI Homunculus Exam:   Joint Counts:  CDAI Tender Joint count: 0 CDAI Swollen Joint count: 0  Global Assessments:  Patient Global Assessment: 5 Provider Global Assessment: 5  CDAI Calculated Score: 10    Investigation: No additional findings.PLQ eye exam: 10/27/2016 Negative  CBC Latest Ref Rng & Units 08/09/2017 06/27/2017 03/15/2017  WBC 4.0 - 10.5 K/uL 11.8(H) 7.9 8.5  Hemoglobin 12.0 - 15.0  g/dL 14.1 13.3 13.5  Hematocrit 36.0 - 46.0 % 42.5 39.5 40.5  Platelets 150 - 400 K/uL 265 234 260   CMP Latest Ref Rng & Units 08/09/2017 06/27/2017 03/15/2017  Glucose 65 - 99 mg/dL 105(H) 109(H) 103(H)  BUN 6 - 20 mg/dL 8 10 10   Creatinine 0.44 - 1.00 mg/dL 0.84 0.74 0.77  Sodium 135 - 145 mmol/L 135 136 135  Potassium 3.5 - 5.1 mmol/L 3.8 3.7 3.7  Chloride 101 - 111 mmol/L 101 104 103  CO2 22 - 32 mmol/L 22 23 25   Calcium 8.9 - 10.3 mg/dL 9.2 9.0 9.1  Total Protein 6.5 - 8.1 g/dL 7.2 7.3 6.8  Total Bilirubin 0.3 - 1.2 mg/dL 0.5 0.5 0.4  Alkaline Phos 38 - 126 U/L 85 90 75  AST 15 - 41 U/L 25 26 21   ALT 14 - 54 U/L 24 24 19     Imaging: No results found.  Speciality Comments: Remicade 6mg /kg every 6 weeks  TB gold negative 10/27/16    Procedures:  No procedures performed Allergies: Patient has no  known allergies.   Assessment / Plan:     Visit Diagnoses: Rheumatoid arthritis of multiple sites with negative rheumatoid factor ([place]): She has no synovitis on exam today.  She has not had any recent flares of her rheumatoid arthritis.  She is clinically doing well on Remicade every 6 weeks and methotrexate 0.3 mL subcutaneous every week and folic acid 1 mg daily.  She does not need any refills at this time.  High risk medication use - Remicade 6mg /Kg q 6 wks and MTX 4.8JE sq qwk , Folic acid 1mg  po qd. She has labs checked with her infusions.  Her last labs were performed in February.  LFTs were WNL.   Fibromyalgia -Her fibromyalgia has been flaring over the past few months due to increased stress at work as well as weather changes.  She continues to take Cymbalta 60 mg by mouth daily.  She would like to increase her Cymbalta dose and was advised to follow-up with her PCP in regards to increasing this dose.  She states she is also taking Klonopin 0.5 mg in the morning and at bedtime.  She states her anxiety and depression have been worsening.  She states she also continues to have  chronic fatigue and insomnia.  She was advised to continue to try to exercise on a regular basis.  A referral was made to integrative therapies.   History of anxiety: She takes Klonopin 0.5 mg in the morning and at bedtime.   History of depression: She is on Cymbalta 60 mg daily.  Primary insomnia: Good sleep hygiene was discussed.  History of gastroesophageal reflux (GERD)    Orders: Orders Placed This Encounter  Procedures  . Ambulatory referral to Physical Therapy   No orders of the defined types were placed in this encounter.   Face-to-face time spent with patient was 30 minutes. >50% of time was spent in counseling and coordination of care.  Follow-Up Instructions: Return in about 5 months (around 02/27/2018) for Rheumatoid arthritis, Fibromyalgia.   Hazel Sams PA-C  I examined and evaluated the patient with Hazel Sams PA. The plan of care was discussed as noted above.   Bo Merino, MD  Note - This record has been created using Editor, commissioning.  Chart creation errors have been sought, but may not always  have been located. Such creation errors do not reflect on  the standard of medical care.

## 2017-09-20 ENCOUNTER — Ambulatory Visit (HOSPITAL_COMMUNITY)
Admission: RE | Admit: 2017-09-20 | Discharge: 2017-09-20 | Disposition: A | Discharge status: Home / Self Care | Payer: 59 | Source: Ambulatory Visit | Attending: Rheumatology | Admitting: Rheumatology

## 2017-09-20 DIAGNOSIS — M0609 Rheumatoid arthritis without rheumatoid factor, multiple sites: Secondary | ICD-10-CM | POA: Diagnosis not present

## 2017-09-20 MED ORDER — DIPHENHYDRAMINE HCL 25 MG PO CAPS: 25.0000 mg | ORAL_CAPSULE | ORAL | Status: DC

## 2017-09-20 MED ORDER — SODIUM CHLORIDE 0.9 % IV SOLN
6.0000 mg/kg | INTRAVENOUS | Status: DC
  Administered 2017-09-20: 500 mg via INTRAVENOUS
  Filled 2017-09-20: qty 50

## 2017-09-20 MED ORDER — ACETAMINOPHEN 325 MG PO TABS: 650.0000 mg | ORAL_TABLET | ORAL | Status: DC

## 2017-09-27 ENCOUNTER — Encounter: Payer: Self-pay | Admitting: Rheumatology

## 2017-09-27 ENCOUNTER — Ambulatory Visit: Payer: 59 | Admitting: Rheumatology

## 2017-09-27 VITALS — BP 121/81 | HR 83 | Resp 16 | Ht 67.0 in | Wt 183.0 lb

## 2017-09-27 DIAGNOSIS — Z8719 Personal history of other diseases of the digestive system: Secondary | ICD-10-CM | POA: Diagnosis not present

## 2017-09-27 DIAGNOSIS — M0609 Rheumatoid arthritis without rheumatoid factor, multiple sites: Secondary | ICD-10-CM

## 2017-09-27 DIAGNOSIS — Z8659 Personal history of other mental and behavioral disorders: Secondary | ICD-10-CM

## 2017-09-27 DIAGNOSIS — F5101 Primary insomnia: Secondary | ICD-10-CM | POA: Diagnosis not present

## 2017-09-27 DIAGNOSIS — Z79899 Other long term (current) drug therapy: Secondary | ICD-10-CM | POA: Diagnosis not present

## 2017-09-27 DIAGNOSIS — M797 Fibromyalgia: Secondary | ICD-10-CM | POA: Diagnosis not present

## 2017-10-22 ENCOUNTER — Other Ambulatory Visit: Payer: Self-pay

## 2017-10-22 ENCOUNTER — Ambulatory Visit (INDEPENDENT_AMBULATORY_CARE_PROVIDER_SITE_OTHER): Payer: 59 | Admitting: Physician Assistant

## 2017-10-22 ENCOUNTER — Encounter: Payer: Self-pay | Admitting: Physician Assistant

## 2017-10-22 VITALS — BP 132/88 | HR 78 | Temp 98.1°F | Resp 18 | Ht 65.25 in | Wt 183.0 lb

## 2017-10-22 DIAGNOSIS — M659 Synovitis and tenosynovitis, unspecified: Secondary | ICD-10-CM | POA: Diagnosis not present

## 2017-10-22 DIAGNOSIS — M797 Fibromyalgia: Secondary | ICD-10-CM | POA: Diagnosis not present

## 2017-10-22 DIAGNOSIS — Z0000 Encounter for general adult medical examination without abnormal findings: Secondary | ICD-10-CM | POA: Diagnosis not present

## 2017-10-22 DIAGNOSIS — G243 Spasmodic torticollis: Secondary | ICD-10-CM

## 2017-10-22 LAB — CBC WITH DIFFERENTIAL/PLATELET
Basophils Absolute: 67 cells/uL (ref 0–200)
Basophils Relative: 0.8 %
Eosinophils Absolute: 126 cells/uL (ref 15–500)
Eosinophils Relative: 1.5 %
HCT: 39.7 % (ref 35.0–45.0)
Hemoglobin: 13.7 g/dL (ref 11.7–15.5)
Lymphs Abs: 3083 cells/uL (ref 850–3900)
MCH: 30.4 pg (ref 27.0–33.0)
MCHC: 34.5 g/dL (ref 32.0–36.0)
MCV: 88.2 fL (ref 80.0–100.0)
MPV: 11.6 fL (ref 7.5–12.5)
Monocytes Relative: 6.3 %
Neutro Abs: 4595 cells/uL (ref 1500–7800)
Neutrophils Relative %: 54.7 %
Platelets: 235 10*3/uL (ref 140–400)
RBC: 4.5 10*6/uL (ref 3.80–5.10)
RDW: 12.9 % (ref 11.0–15.0)
Total Lymphocyte: 36.7 %
WBC mixed population: 529 cells/uL (ref 200–950)
WBC: 8.4 10*3/uL (ref 3.8–10.8)

## 2017-10-22 LAB — COMPLETE METABOLIC PANEL WITH GFR
AG Ratio: 1.4 (calc) (ref 1.0–2.5)
ALT: 22 U/L (ref 6–29)
AST: 24 U/L (ref 10–35)
Albumin: 4 g/dL (ref 3.6–5.1)
Alkaline phosphatase (APISO): 82 U/L (ref 33–130)
BUN: 12 mg/dL (ref 7–25)
CO2: 27 mmol/L (ref 20–32)
Calcium: 9.2 mg/dL (ref 8.6–10.4)
Chloride: 106 mmol/L (ref 98–110)
Creat: 0.74 mg/dL (ref 0.50–1.05)
GFR, Est African American: 103 mL/min/{1.73_m2} (ref >=60)
GFR, Est Non African American: 89 mL/min/{1.73_m2} (ref >=60)
Globulin: 2.9 g/dL (calc) (ref 1.9–3.7)
Glucose, Bld: 83 mg/dL (ref 65–99)
Potassium: 4.1 mmol/L (ref 3.5–5.3)
Sodium: 140 mmol/L (ref 135–146)
Total Bilirubin: 0.4 mg/dL (ref 0.2–1.2)
Total Protein: 6.9 g/dL (ref 6.1–8.1)

## 2017-10-22 LAB — LIPID PANEL
Cholesterol: 206 mg/dL — ABNORMAL HIGH (ref <200)
HDL: 52 mg/dL (ref >50)
LDL Cholesterol (Calc): 136 mg/dL (calc) — ABNORMAL HIGH
Non-HDL Cholesterol (Calc): 154 mg/dL (calc) — ABNORMAL HIGH (ref <130)
Total CHOL/HDL Ratio: 4 (calc) (ref <5.0)
Triglycerides: 83 mg/dL (ref <150)

## 2017-10-22 LAB — TSH: TSH: 1.43 mIU/L (ref 0.40–4.50)

## 2017-10-22 MED ORDER — TRAMADOL HCL 50 MG PO TABS: ORAL_TABLET | ORAL | 0 refills | Status: DC

## 2017-10-22 NOTE — Progress Notes (Signed)
Patient ID: ELBONY MCCLIMANS MRN: [id number], DOB: 12-Nov-1960, 57 y.o. Date of Encounter: 10/22/2017,   Chief Complaint: Physical (CPE)  HPI: 57 y.o. y/o female  here for CPE.    She has multiple specialists that she sees on a routine basis.: Rheumatology ---Dr. Estanislado Pandy Neurology-------- she still gets injections for her neck Gynecology----- she has her pelvic exams pap smears breast exams and mammograms with GYN She states that she saw a different GI doctor several years ago because she had issues with her liver--says that was a different GI doctor than she saw for her colonoscopy. Dermatology---- Dr. Syble Creek  She reports that things have been pretty stable for her for the last couple of months.  Says that in general it seems that things that usually cycle about every 2-3 months. Says that she was a little upset about 1 thing.  Says that in the past Dr. Burr Medico had prescribed Tramadol and she had used it very sparingly. Last time she asked for a refill they refused refill. Said she would have to go to Pain Management. Or to "ask PCP'. Says she definitely does not need pain management. Says if she had 30 Tramadol, it would last ehr 6 months. I told her I would give her Rx for this.   She has no other concerns to address today.     Review of Systems: Consitutional: No fever, chills, fatigue, night sweats, lymphadenopathy. No significant/unexplained weight changes. Eyes: No visual changes, eye redness, or discharge. ENT/Mouth: No ear pain, sore throat, nasal drainage, or sinus pain. Cardiovascular: No chest pressure,heaviness, tightness or squeezing, even with exertion. No increased shortness of breath or dyspnea on exertion.No palpitations, edema, orthopnea, PND. Respiratory: No cough, hemoptysis, SOB, or wheezing. Gastrointestinal: No anorexia, dysphagia, reflux, pain, nausea, vomiting, hematemesis, diarrhea, constipation, BRBPR, or melena. Breast: No mass, nodules, bulging, or  retraction. No skin changes or inflammation. No nipple discharge. No lymphadenopathy. Genitourinary: No dysuria, hematuria, incontinence, vaginal discharge, pruritis, burning, abnormal bleeding, or pain. Musculoskeletal: She has RA. 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  Skin: No rash, pruritis, or concerning lesions. Neurological: No headache, dizziness, syncope, seizures, tremors, memory loss, coordination problems, or paresthesias. Psychological: No hallucinations, SI/HI. Endocrine: No polydipsia, polyphagia, polyuria, or known diabetes.No increased fatigue. No palpitations/rapid heart rate. No significant/unexplained weight change. All other systems were reviewed and are otherwise negative.  Past Medical History:  Diagnosis Date  . Anxiety   . Cervical dystonia   . Depression   . Fibromyalgia   . GERD (gastroesophageal reflux disease)   . History of shingles   . RA (rheumatoid arthritis) ([place])   . Synovitis of hand      Past Surgical History:  Procedure Laterality Date  . TONSILLECTOMY    . VAGINAL HYSTERECTOMY  3/08    Home Meds:  Outpatient Medications Prior to Visit  Medication Sig Dispense Refill  . AbobotulinumtoxinA (DYSPORT) 500 units SOLR injection Inject 500 Units into the muscle every 3 (three) months. 1 each 3  . acetaminophen (TYLENOL) 325 MG tablet Take 650 mg by mouth once. One hour prior to infusion    . B-D TB SYRINGE 1CC/27GX1/2" 27G X 1/2" 1 ML MISC use 1 TB syringe once weekly 4 each 11  . baclofen (LIORESAL) 10 MG tablet Take 10 mg by mouth daily as needed for muscle spasms.     Marland Kitchen BIOTIN PO Take by mouth daily.    . clonazePAM (KLONOPIN) 0.5 MG tablet TAKE 1 TABLET BY MOUTH TWICE  DAILY IF NEEDED FOR ANXIETY 60 tablet 5  . diphenhydrAMINE (BENADRYL) 25 mg capsule Take 25 mg by mouth once. One hour prior to infusion    . DULoxetine (CYMBALTA) 60 MG capsule Take 1 capsule (60 mg total) by mouth daily. 30 capsule 5  . folic acid (FOLVITE) 1 MG tablet Take 1  tablet (1 mg total) by mouth daily. 90 tablet 4  . inFLIXimab (REMICADE) 100 MG injection Inject 6 mg/kg into the vein every 6 (six) weeks. Infuse 500 mg/ 5 vials in 247mL NS over 2hours every 6 weeks    . methotrexate 50 MG/2ML injection INJECT 0.3 MILLILITERS INTO SKIN ONCE A WEEK 4 mL 0  . Multiple Vitamins-Minerals (MULTIVITAMINS THER. W/MINERALS) TABS Take 1 tablet by mouth daily.    Marland Kitchen omeprazole (PRILOSEC) 20 MG capsule TAKE 1 CAPSULE BY MOUTH ONCE DAILY 90 capsule 1  . Probiotic Product (PROBIOTIC ADVANCED PO) Take by mouth.    . TURMERIC PO Take by mouth daily.    . Xylitol (XYLIMELTS MT) Use as directed in the mouth or throat.     No facility-administered medications prior to visit.     Allergies: No Known Allergies  Social History   Socioeconomic History  . Marital status: Married    Spouse name: [name]  . Number of children: 2  . Years of education: 62  . Highest education level: Not on file  Occupational History    Employer: [employer]  . Financial resource strain: Not on file  . Food insecurity:    Worry: Not on file    Inability: Not on file  . Transportation needs:    Medical: Not on file    Non-medical: Not on file  Tobacco Use  . Smoking status: Former Smoker    Packs/day: 0.50    Years: 30.00    Pack years: 15.00    Types: Cigarettes    Last attempt to quit: 05/02/2015    Years since quitting: 2.4  . Smokeless tobacco: Never Used  Substance and Sexual Activity  . Alcohol use: Yes    Alcohol/week: 0.0 oz    Comment: Rare  . Drug use: Never  . Sexual activity: Yes    Birth control/protection: Surgical    Comment: 1st intercourse 57 yo-Fewer than 5 partners  Lifestyle  . Physical activity:    Days per week: Not on file    Minutes per session: Not on file  . Stress: Not on file  Relationships  . Social connections:    Talks on phone: Not on file    Gets together: Not on file    Attends religious service: Not on file    Active  member of club or organization: Not on file    Attends meetings of clubs or organizations: Not on file    Relationship status: Not on file  . Intimate partner violence:    Fear of current or ex partner: Not on file    Emotionally abused: Not on file    Physically abused: Not on file    Forced sexual activity: Not on file  Other Topics Concern  . 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 Right handed.    Caffeine- 4 cups daily.    Family History  Problem Relation Age of Onset  . Arthritis Mother   . Heart Problems Father   . Heart  disease Father   . Heart disease Paternal Uncle        MI    Physical Exam: Blood pressure 132/88, pulse 78, temperature 98.1 F (36.7 C), temperature source Oral, resp. rate 18, height 5' 5.25" (1.657 m), weight 83 kg (183 lb), SpO2 99 %., Body mass index is 30.22 kg/m. General: Well developed, well nourished, WF. Appears in no acute distress. HEENT: Normocephalic, atraumatic. Conjunctiva pink, sclera non-icteric. Pupils 2 mm constricting to 1 mm, round, regular, and equally reactive to light and accomodation. EOMI. Internal auditory canal clear. TMs with good cone of light and without pathology. Nasal mucosa pink. Nares are without discharge. No sinus tenderness. Oral mucosa pink. Neck:  Trachea midline. No thyromegaly. No lymphadenopathy.No Carotid Bruits.She does have tremor/muscle jerks of neck.  Lungs: Clear to auscultation bilaterally without wheezes, rales, or rhonchi. Breathing is of normal effort and unlabored. Cardiovascular: RRR with S1 S2. No murmurs, rubs, or gallops. Distal pulses 2+ symmetrically. No carotid or abdominal bruits. Breast: Per Gyn Abdomen: Soft, non-tender, non-distended with normoactive bowel sounds. No hepatosplenomegaly or masses. No rebound/guarding. No CVA tenderness. No hernias.  Genitourinary: Per  Gyn Musculoskeletal: She has RA, managed by Rheumatology.  Skin: Warm and moist without erythema, ecchymosis, wounds, or rash. Neuro: A+Ox3. CN II-XII grossly intact. Moves all extremities spontaneously. Full sensation throughout. Normal gait.  Psych:  Responds to questions appropriately with a normal affect.   Assessment/Plan:  57 y.o. y/o female here for CPE  1. Visit for preventive health examination  A. Screening Labs: She is fasting and agreeable to check screening labs. - CBC with Differential/Platelet - COMPLETE METABOLIC PANEL WITH GFR - Lipid panel - TSH   B. Pap: Per Gyn  C. Screening Mammogram: Per Gyn  D. DEXA/BMD:  Per Gyn. Ow/ wait until age 44  E. Colorectal Cancer Screening: She had screening colonoscopy by Dr. Verl Blalock 10/25/2009. Normal. Repeat 10 years.  F. Immunizations:  Influenza: Received influenza vaccine 05/01/2015 Tetanus: She has had no tetanus vaccine in > 10 years.  She is agreeable to update today. Pneumococcal: She received Prevnar 13-----05/01/2015.  Pneumovax 23--Given here 04/10/2016 Shingrix: Discussed at CPE 10/22/2017: She is to check with her insurance regarding coverage and cost and if agreeable to get this will get at pharmacy.     [address] [place], [place], [facility] 10/22/2017 10:57 AM

## 2017-10-24 ENCOUNTER — Telehealth: Payer: Self-pay | Admitting: Neurology

## 2017-10-24 NOTE — Telephone Encounter (Signed)
I called and requested a refill on the patients Dysport medication.

## 2017-10-25 DIAGNOSIS — G243 Spasmodic torticollis: Secondary | ICD-10-CM | POA: Diagnosis not present

## 2017-10-30 ENCOUNTER — Encounter: Payer: Self-pay | Admitting: Family Medicine

## 2017-10-30 ENCOUNTER — Other Ambulatory Visit: Payer: Self-pay

## 2017-10-30 ENCOUNTER — Encounter: Payer: Self-pay | Admitting: Physician Assistant

## 2017-10-30 ENCOUNTER — Ambulatory Visit: Payer: 59 | Admitting: Family Medicine

## 2017-10-30 VITALS — BP 110/78 | HR 101 | Temp 98.1°F | Resp 16 | Ht 65.25 in | Wt 181.2 lb

## 2017-10-30 DIAGNOSIS — J209 Acute bronchitis, unspecified: Secondary | ICD-10-CM | POA: Diagnosis not present

## 2017-10-30 DIAGNOSIS — J029 Acute pharyngitis, unspecified: Secondary | ICD-10-CM | POA: Diagnosis not present

## 2017-10-30 DIAGNOSIS — J069 Acute upper respiratory infection, unspecified: Secondary | ICD-10-CM

## 2017-10-30 LAB — INFLUENZA A AND B AG, IMMUNOASSAY
INFLUENZA A ANTIGEN: NOT DETECTED
INFLUENZA B ANTIGEN: NOT DETECTED

## 2017-10-30 LAB — STREP GROUP A AG, W/REFLEX TO CULT: Streptococcus, Group A Screen (Direct): NOT DETECTED

## 2017-10-30 MED ORDER — BENZONATATE 100 MG PO CAPS: 100.0000 mg | ORAL_CAPSULE | Freq: Three times a day (TID) | ORAL | 0 refills | Status: DC | PRN

## 2017-10-30 MED ORDER — IPRATROPIUM-ALBUTEROL 0.5-2.5 (3) MG/3ML IN SOLN
3.0000 mL | Freq: Once | RESPIRATORY_TRACT | Status: AC
  Administered 2017-10-30: 3 mL via RESPIRATORY_TRACT

## 2017-10-30 MED ORDER — ALBUTEROL SULFATE HFA 108 (90 BASE) MCG/ACT IN AERS: 2.0000 | INHALATION_SPRAY | Freq: Four times a day (QID) | RESPIRATORY_TRACT | 0 refills | Status: DC | PRN

## 2017-10-30 NOTE — Progress Notes (Signed)
Patient ID: Courtney Myers, female    DOB: July 17, 1960, 57 y.o.   MRN: [id number]  PCP: Orlena Sheldon, PA-C  Chief Complaint  Patient presents with  . Chest Pain    symptoms since saturday   . pressure under eyes  . Cough    no fever, no headace,no mucus    Subjective:   Courtney Myers is a 57 y.o. female, with past medical history of RA, GERD, fibromyalgia, anxiety and depression, presents to clinic with CC of 3 days of rapidly worsening cold and cough symptoms.  Began 3 days ago with sore throat, the next day she became more tired throughout the day with increased nasal congestion.  Yesterday she slept most of the day, had hot and cold chills, developed a deep severe, hard cough that was nonproductive with associated chest tightness.  Sore throat has subsided but she continues to have nasal congestion and chest congestion with pressure in her sinuses around her eyes.  Being ill causes her joints and muscles to be more sore than usual.  She has decreased appetite.  She is on immunosuppressive medicines for RA.  denies fever, sweats, shortness of breath, chest pain, rash, abdominal pain, back pain, nausea, vomiting, diarrhea. She stopped smoking 6 years ago but reports 30+ pack year history of smoking.  She works in a [employer] and has many sick contacts including children family members and coworkers daily who have been ill with colds and coughs.   Patient Active Problem List   Diagnosis Date Noted  . Excessive sleepiness 09/08/2014  . Fibromyalgia syndrome 08/25/2014  . RA (rheumatoid arthritis) ([place])   . GERD (gastroesophageal reflux disease)   . Anxiety   . Depression   . History of shingles   . Synovitis of hand   . Cervical dystonia 12/04/2012     Prior to Admission medications   Medication Sig Start Date End Date Taking? Authorizing Provider  AbobotulinumtoxinA (DYSPORT) 500 units SOLR injection Inject 500 Units into the muscle every 3 (three) months. 11/30/16  Yes  Marcial Pacas, MD  acetaminophen (TYLENOL) 325 MG tablet Take 650 mg by mouth once. One hour prior to infusion   Yes [provider]  B-D TB SYRINGE 1CC/27GX1/2" 27G X 1/2" 1 ML MISC use 1 TB syringe once weekly 10/17/16  Yes Deveshwar, Abel Presto, MD  baclofen (LIORESAL) 10 MG tablet Take 10 mg by mouth daily as needed for muscle spasms.  05/01/13  Yes [provider]  BIOTIN PO Take by mouth daily.   Yes [provider]  clonazePAM (KLONOPIN) 0.5 MG tablet TAKE 1 TABLET BY MOUTH TWICE DAILY IF NEEDED FOR ANXIETY 09/05/17  Yes Marcial Pacas, MD  diphenhydrAMINE (BENADRYL) 25 mg capsule Take 25 mg by mouth once. One hour prior to infusion   Yes [provider]  DULoxetine (CYMBALTA) 60 MG capsule Take 1 capsule (60 mg total) by mouth daily. 07/02/17  Yes Deveshwar, Abel Presto, MD  folic acid (FOLVITE) 1 MG tablet Take 1 tablet (1 mg total) by mouth daily. 08/14/17 11/07/18 Yes Deveshwar, Abel Presto, MD  inFLIXimab (REMICADE) 100 MG injection Inject 6 mg/kg into the vein every 6 (six) weeks. Infuse 500 mg/ 5 vials in 279mL NS over 2hours every 6 weeks   Yes Deveshwar, Abel Presto, MD  methotrexate 50 MG/2ML injection INJECT 0.3 MILLILITERS INTO SKIN ONCE A WEEK 08/28/17  Yes Deveshwar, Abel Presto, MD  Multiple Vitamins-Minerals (MULTIVITAMINS THER. W/MINERALS) TABS Take 1 tablet by mouth daily.   Yes  [provider]  omeprazole (PRILOSEC) 20 MG capsule TAKE 1 CAPSULE BY MOUTH ONCE DAILY 08/30/17  Yes Dena Billet B, PA-C  Probiotic Product (PROBIOTIC ADVANCED PO) Take by mouth.   Yes [provider]  traMADol (ULTRAM) 50 MG tablet Take 1 - 2 every 8 hours as needed for pain. 10/22/17  Yes Dena Billet B, PA-C  TURMERIC PO Take by mouth daily.   Yes [provider]  Xylitol (XYLIMELTS MT) Use as directed in the mouth or throat.   Yes [provider]     No Known Allergies   Family History  Problem Relation Age of Onset  . Arthritis Mother   . Heart Problems  Father   . Heart disease Father   . Heart disease Paternal Uncle        MI     Social History   Socioeconomic History  . Marital status: Married    Spouse name: Courtney Myers  . Number of children: 2  . Years of education: 15  . Highest education level: Not on file  Occupational History    Employer: [employer]  . Financial resource strain: Not on file  . Food insecurity:    Worry: Not on file    Inability: Not on file  . Transportation needs:    Medical: Not on file    Non-medical: Not on file  Tobacco Use  . Smoking status: Former Smoker    Packs/day: 0.50    Years: 30.00    Pack years: 15.00    Types: Cigarettes    Last attempt to quit: 05/02/2015    Years since quitting: 2.4  . Smokeless tobacco: Never Used  Substance and Sexual Activity  . Alcohol use: Yes    Alcohol/week: 0.0 oz    Comment: Rare  . Drug use: Never  . Sexual activity: Yes    Birth control/protection: Surgical    Comment: 1st intercourse 57 yo-Fewer than 5 partners  Lifestyle  . Physical activity:    Days per week: Not on file    Minutes per session: Not on file  . Stress: Not on file  Relationships  . Social connections:    Talks on phone: Not on file    Gets together: Not on file    Attends religious service: Not on file    Active member of club or organization: Not on file    Attends meetings of clubs or organizations: Not on file    Relationship status: Not on file  . Intimate partner violence:    Fear of current or ex partner: Not on file    Emotionally abused: Not on file    Physically abused: Not on file    Forced sexual activity: Not on file  Other Topics Concern  . 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 Right handed.    Caffeine- 4 cups daily.     Review of Systems  Constitutional: Negative for unexpected weight change.  HENT: Negative  for dental problem, drooling, ear discharge, ear pain, facial swelling, hearing loss, mouth sores, nosebleeds, tinnitus, trouble swallowing and voice change.   Eyes: Negative.  Negative for pain, discharge, redness and itching.  Respiratory: Negative for apnea, choking and stridor.   Cardiovascular: Negative.  Negative for chest pain, palpitations and leg swelling.  Gastrointestinal: Negative.  Negative for abdominal distention, abdominal pain, constipation, diarrhea,  nausea and vomiting.  Endocrine: Negative.   Genitourinary: Negative.   Musculoskeletal: Negative.  Negative for gait problem and joint swelling.  Skin: Negative.  Negative for color change, pallor and rash.  Neurological: Negative.  Negative for dizziness, tremors, syncope, weakness, light-headedness and headaches.  Hematological: Negative.   Psychiatric/Behavioral: Negative.   All other systems reviewed and are negative.      Objective:    Vitals:   10/30/17 0810  BP: 110/78  Pulse: (!) 101  Resp: 16  Temp: 98.1 F (36.7 C)  TempSrc: Oral  SpO2: 95%  Weight: 181 lb 3.2 oz (82.2 kg)  Height: 5' 5.25" (1.657 m)      Physical Exam  Constitutional: She is oriented to person, place, and time. She appears well-developed and well-nourished.  Non-toxic appearance. She appears ill. No distress.  Mildly ill-appearing female, appears stated age, no distress  HENT:  Head: Normocephalic and atraumatic.  Right Ear: External ear normal.  Left Ear: External ear normal.  Mouth/Throat: Uvula is midline and mucous membranes are normal. No oropharyngeal exudate.  Bilateral TMs normal Bilateral external auditory canals normal No sinus tenderness to palpation Nose appears mildly erythematous and chafed, nasal mucosa erythematous with moderate discharge Posterior oropharynx diffusely erythematous, no edema, no exudate, tonsils not visible, uvula midline No cervical lymphadenopathy  Eyes: Pupils are equal, round, and reactive  to light. Conjunctivae, EOM and lids are normal. Right eye exhibits no discharge. Left eye exhibits no discharge. No scleral icterus.  Neck: Normal range of motion and phonation normal. Neck supple. No tracheal deviation present. No thyromegaly present.  Cardiovascular: Regular rhythm, normal heart sounds, intact distal pulses and normal pulses. Tachycardia present. Exam reveals no gallop and no friction rub.  No murmur heard. Pulses:      Radial pulses are 2+ on the right side, and 2+ on the left side.       Posterior tibial pulses are 2+ on the right side, and 2+ on the left side.  Pulmonary/Chest: Effort normal and breath sounds normal. No accessory muscle usage or stridor. No tachypnea. No respiratory distress. She has no decreased breath sounds. She has no wheezes. She has no rhonchi. She has no rales. She exhibits no tenderness.  Normal respirations, good inspiratory effort, symmetrical chest expansion, clear to auscultation anteriorly and posteriorly in all lung fields  Abdominal: Soft. Normal appearance and bowel sounds are normal. She exhibits no distension. There is no tenderness. There is no rebound and no guarding.  Musculoskeletal: Normal range of motion. She exhibits no edema or deformity.  Lymphadenopathy:    She has no cervical adenopathy.  Neurological: She is alert and oriented to person, place, and time. She exhibits normal muscle tone. Gait normal.  Skin: Skin is warm, dry and intact. Capillary refill takes less than 2 seconds. No rash noted. She is not diaphoretic. No cyanosis. No pallor. Nails show no clubbing.  Psychiatric: She has a normal mood and affect. Her speech is normal and behavior is normal.  Nursing note and vitals reviewed.         Assessment & Plan:      ICD-10-CM   1. Upper respiratory tract infection, unspecified type J06.9 Influenza A and B Ag, Immunoassay    STREP GROUP A AG, W/REFLEX TO CULT  2. Acute bronchitis, unspecified organism J20.9  ipratropium-albuterol (DUONEB) 0.5-2.5 (3) MG/3ML nebulizer solution 3 mL    benzonatate (TESSALON) 100 MG capsule    albuterol (PROVENTIL HFA;VENTOLIN HFA) 108 (90 Base) MCG/ACT inhaler  3. Sore throat J02.9 Influenza A and B Ag, Immunoassay    STREP GROUP A AG, W/REFLEX TO CULT    Patient is a 57 year old female, former smoker with 30+ pack year history, history of RA and fibromyalgia, on immunosuppressive drugs, she presents with URI symptoms with worsening cough with chest tightness.  Her exam is consistent with URI likely a viral illness, flu and strep swabs were obtained which were negative, strep throat culture sent out.  She denied any shortness of breath but felt very tight in her chest, her breath sounds were normal on initial exam, she was given a breathing treatment and she did report improvement in the feeling of chest tightness.  Lung exam was repeated after breathing treatment in all lung fields remain clear to auscultation, she has no distress, no chest pain.  She had no other focal physical exam findings concerning for a bacterial infection, lungs clear, no sinus tenderness.  Did not feel that there is indication for antibiotics currently however we did discuss at length her immune compromised state and how she may very easily develop a secondary bacterial infection.  Did not want to guess what that might be and she will call us if she develops any sinus pain or worsening cough.  If her respiratory symptoms do not improve or if they worsen I explained to her that I would send her for chest x-ray and would call her in antibiotic to treat the lungs, if her sinuses worsened I would call her in a antibiotic for sinuses.  She agrees to plan.  Currently treatment will be cough suppression, albuterol inhaler, Mucinex and decongestants with supportive treatment and Tylenol ibuprofen for analgesia.  Increase fluids and rest.  She was written out of work for the next 2 days.  Hopefully will improve.   Patient agrees to plan as described above and return precautions and ER precautions were reviewed and verbally acknowledged by patient.  She left the clinic in good and stable condition with no distress.   Delsa Grana, PA-C 10/30/17 10:12 AM

## 2017-10-30 NOTE — Patient Instructions (Signed)
Please call if any symptoms worsen.   Because you are immunocompromised, if something worsens, please call and put a message up to me so I can get you appropriate medicines.       For cough start taking mucinex daily with ample water.    Use inhaler every 4-6 hours as needed for chest tightness, wheezy cough or shortness of breath.  Use tylenol/ibuprofen as directed for aches, pains, hot/cold chills (fever) etc.  Rest, push fluids.  Can try sudafed for sinus pressure     Upper Respiratory Infection, Adult Most upper respiratory infections (URIs) are caused by a virus. A URI affects the nose, throat, and upper air passages. The most common type of URI is often called "the common cold." Follow these instructions at home:  Take medicines only as told by your doctor.  Gargle warm saltwater or take cough drops to comfort your throat as told by your doctor.  Use a warm mist humidifier or inhale steam from a shower to increase air moisture. This may make it easier to breathe.  Drink enough fluid to keep your pee (urine) clear or pale yellow.  Eat soups and other clear broths.  Have a healthy diet.  Rest as needed.  Go back to work when your fever is gone or your doctor says it is okay. ? You may need to stay home longer to avoid giving your URI to others. ? You can also wear a face mask and wash your hands often to prevent spread of the virus.  Use your inhaler more if you have asthma.  Do not use any tobacco products, including cigarettes, chewing tobacco, or electronic cigarettes. If you need help quitting, ask your doctor. Contact a doctor if:  You are getting worse, not better.  Your symptoms are not helped by medicine.  You have chills.  You are getting more short of breath.  You have brown or red mucus.  You have yellow or brown discharge from your nose.  You have pain in your face, especially when you bend forward.  You have a fever.  You have puffy  (swollen) neck glands.  You have pain while swallowing.  You have white areas in the back of your throat. Get help right away if:  You have very bad or constant: ? Headache. ? Ear pain. ? Pain in your forehead, behind your eyes, and over your cheekbones (sinus pain). ? Chest pain.  You have long-lasting (chronic) lung disease and any of the following: ? Wheezing. ? Long-lasting cough. ? Coughing up blood. ? A change in your usual mucus.  You have a stiff neck.  You have changes in your: ? Vision. ? Hearing. ? Thinking. ? Mood. This information is not intended to replace advice given to you by your health care provider. Make sure you discuss any questions you have with your health care provider. Document Released: 12/06/2007 Document Revised: 02/20/2016 Document Reviewed: 09/24/2013 Elsevier Interactive Patient Education  2018 [employer].   Acute Bronchitis, Adult Acute bronchitis is sudden (acute) swelling of the air tubes (bronchi) in the lungs. Acute bronchitis causes these tubes to fill with mucus, which can make it hard to breathe. It can also cause coughing or wheezing. In adults, acute bronchitis usually goes away within 2 weeks. A cough caused by bronchitis may last up to 3 weeks. Smoking, allergies, and asthma can make the condition worse. Repeated episodes of bronchitis may cause further lung problems, such as chronic obstructive pulmonary disease (COPD). What  are the causes? This condition can be caused by germs and by substances that irritate the lungs, including:  Cold and flu viruses. This condition is most often caused by the same virus that causes a cold.  Bacteria.  Exposure to tobacco smoke, dust, fumes, and air pollution.  What increases the risk? This condition is more likely to develop in people who:  Have close contact with someone with acute bronchitis.  Are exposed to lung irritants, such as tobacco smoke, dust, fumes, and vapors.  Have a  weak immune system.  Have a respiratory condition such as asthma.  What are the signs or symptoms? Symptoms of this condition include:  A cough.  Coughing up clear, yellow, or green mucus.  Wheezing.  Chest congestion.  Shortness of breath.  A fever.  Body aches.  Chills.  A sore throat.  How is this diagnosed? This condition is usually diagnosed with a physical exam. During the exam, your health care provider may order tests, such as chest X-rays, to rule out other conditions. He or she may also:  Test a sample of your mucus for bacterial infection.  Check the level of oxygen in your blood. This is done to check for pneumonia.  Do a chest X-ray or lung function testing to rule out pneumonia and other conditions.  Perform blood tests.  Your health care provider will also ask about your symptoms and medical history. How is this treated? Most cases of acute bronchitis clear up over time without treatment. Your health care provider may recommend:  Drinking more fluids. Drinking more makes your mucus thinner, which may make it easier to breathe.  Taking a medicine for a fever or cough.  Taking an antibiotic medicine.  Using an inhaler to help improve shortness of breath and to control a cough.  Using a cool mist vaporizer or humidifier to make it easier to breathe.  Follow these instructions at home: Medicines  Take over-the-counter and prescription medicines only as told by your health care provider.  If you were prescribed an antibiotic, take it as told by your health care provider. Do not stop taking the antibiotic even if you start to feel better. General instructions  Get plenty of rest.  Drink enough fluids to keep your urine clear or pale yellow.  Avoid smoking and secondhand smoke. Exposure to cigarette smoke or irritating chemicals will make bronchitis worse. If you smoke and you need help quitting, ask your health care provider. Quitting smoking  will help your lungs heal faster.  Use an inhaler, cool mist vaporizer, or humidifier as told by your health care provider.  Keep all follow-up visits as told by your health care provider. This is important. How is this prevented? To lower your risk of getting this condition again:  Wash your hands often with soap and water. If soap and water are not available, use hand sanitizer.  Avoid contact with people who have cold symptoms.  Try not to touch your hands to your mouth, nose, or eyes.  Make sure to get the flu shot every year.  Contact a health care provider if:  Your symptoms do not improve in 2 weeks of treatment. Get help right away if:  You cough up blood.  You have chest pain.  You have severe shortness of breath.  You become dehydrated.  You faint or keep feeling like you are going to faint.  You keep vomiting.  You have a severe headache.  Your fever or chills gets  worse. This information is not intended to replace advice given to you by your health care provider. Make sure you discuss any questions you have with your health care provider. Document Released: 07/27/2004 Document Revised: 01/12/2016 Document Reviewed: 12/08/2015 Elsevier Interactive Patient Education  [employer].

## 2017-10-31 ENCOUNTER — Ambulatory Visit: Payer: 59 | Admitting: Neurology

## 2017-10-31 ENCOUNTER — Encounter: Payer: Self-pay | Admitting: Neurology

## 2017-10-31 ENCOUNTER — Other Ambulatory Visit: Payer: Self-pay | Admitting: *Deleted

## 2017-11-01 ENCOUNTER — Ambulatory Visit (HOSPITAL_COMMUNITY): Admission: RE | Admit: 2017-11-01 | Payer: 59 | Source: Ambulatory Visit

## 2017-11-01 LAB — CULTURE, GROUP A STREP
MICRO NUMBER:: 90524590
SPECIMEN QUALITY:: ADEQUATE

## 2017-11-02 NOTE — Progress Notes (Signed)
Lab work unremarkable - patient will be notified

## 2017-11-07 ENCOUNTER — Other Ambulatory Visit: Payer: Self-pay | Admitting: *Deleted

## 2017-11-07 DIAGNOSIS — M0609 Rheumatoid arthritis without rheumatoid factor, multiple sites: Secondary | ICD-10-CM

## 2017-11-08 ENCOUNTER — Ambulatory Visit (HOSPITAL_COMMUNITY)
Admission: RE | Admit: 2017-11-08 | Discharge: 2017-11-08 | Disposition: A | Discharge status: Home / Self Care | Payer: 59 | Source: Ambulatory Visit | Attending: Rheumatology | Admitting: Rheumatology

## 2017-11-08 DIAGNOSIS — M0609 Rheumatoid arthritis without rheumatoid factor, multiple sites: Secondary | ICD-10-CM

## 2017-11-08 LAB — COMPREHENSIVE METABOLIC PANEL
ALT: 20 U/L (ref 14–54)
AST: 22 U/L (ref 15–41)
Albumin: 3.4 g/dL — ABNORMAL LOW (ref 3.5–5.0)
Alkaline Phosphatase: 83 U/L (ref 38–126)
Anion gap: 11 (ref 5–15)
BUN: 10 mg/dL (ref 6–20)
CO2: 25 mmol/L (ref 22–32)
Calcium: 9 mg/dL (ref 8.9–10.3)
Chloride: 100 mmol/L — ABNORMAL LOW (ref 101–111)
Creatinine, Ser: 0.82 mg/dL (ref 0.44–1.00)
GFR calc Af Amer: >60 mL/min (ref >60)
GFR calc non Af Amer: >60 mL/min (ref >60)
Glucose, Bld: 143 mg/dL — ABNORMAL HIGH (ref 65–99)
Potassium: 3.6 mmol/L (ref 3.5–5.1)
Sodium: 136 mmol/L (ref 135–145)
Total Bilirubin: 0.5 mg/dL (ref 0.3–1.2)
Total Protein: 6.9 g/dL (ref 6.5–8.1)

## 2017-11-08 LAB — CBC
HCT: 39.5 % (ref 36.0–46.0)
Hemoglobin: 13 g/dL (ref 12.0–15.0)
MCH: 30.1 pg (ref 26.0–34.0)
MCHC: 32.9 g/dL (ref 30.0–36.0)
MCV: 91.4 fL (ref 78.0–100.0)
Platelets: 262 10*3/uL (ref 150–400)
RBC: 4.32 MIL/uL (ref 3.87–5.11)
RDW: 14.3 % (ref 11.5–15.5)
WBC: 9.7 10*3/uL (ref 4.0–10.5)

## 2017-11-08 MED ORDER — DIPHENHYDRAMINE HCL 25 MG PO CAPS: 25.0000 mg | ORAL_CAPSULE | ORAL | Status: DC

## 2017-11-08 MED ORDER — SODIUM CHLORIDE 0.9 % IV SOLN
6.0000 mg/kg | INTRAVENOUS | Status: DC
  Administered 2017-11-08: 500 mg via INTRAVENOUS
  Filled 2017-11-08: qty 50

## 2017-11-08 MED ORDER — ACETAMINOPHEN 325 MG PO TABS: 650.0000 mg | ORAL_TABLET | ORAL | Status: DC

## 2017-11-08 NOTE — Progress Notes (Signed)
Glucose elevated

## 2017-11-12 DIAGNOSIS — Z0100 Encounter for examination of eyes and vision without abnormal findings: Secondary | ICD-10-CM | POA: Diagnosis not present

## 2017-11-12 IMAGING — US US TRANSVAGINAL NON-OB
1 series · 14 of 25 positions shown · non-contrast
Comparison: None

CLINICAL DATA: Left adnexal pain for 2 days. Back pain. History of
partial hysterectomy.

EXAM:
TRANSABDOMINAL AND TRANSVAGINAL ULTRASOUND OF PELVIS
TECHNIQUE: Both transabdominal and transvaginal ultrasound examinations of the
pelvis were performed. Transabdominal technique was performed for
global imaging of the pelvis including uterus, ovaries, adnexal
regions, and pelvic cul-de-sac.

[Series 1: us transvaginal non-ob · 0.22mm/px · 47 acquisitions, 14 frames shown]
[im 1/47]
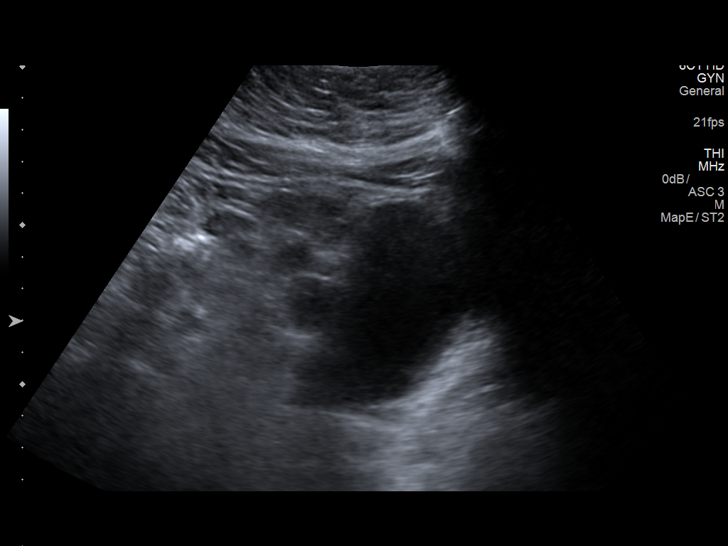
[im 4/47]
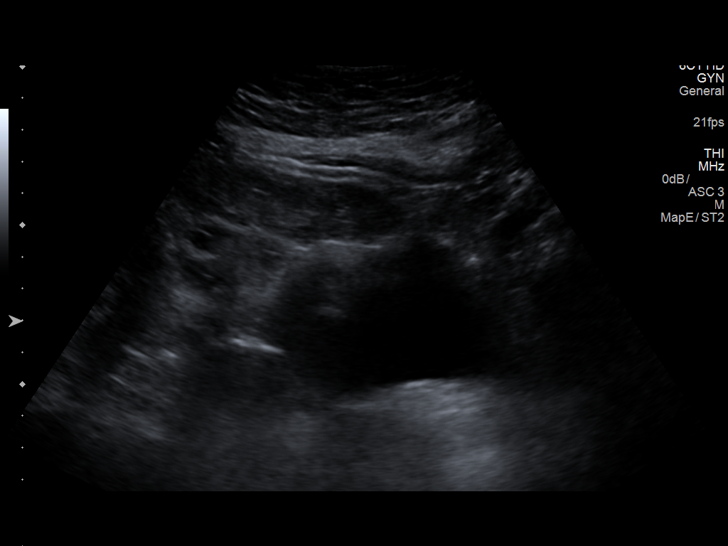
[im 8/47]
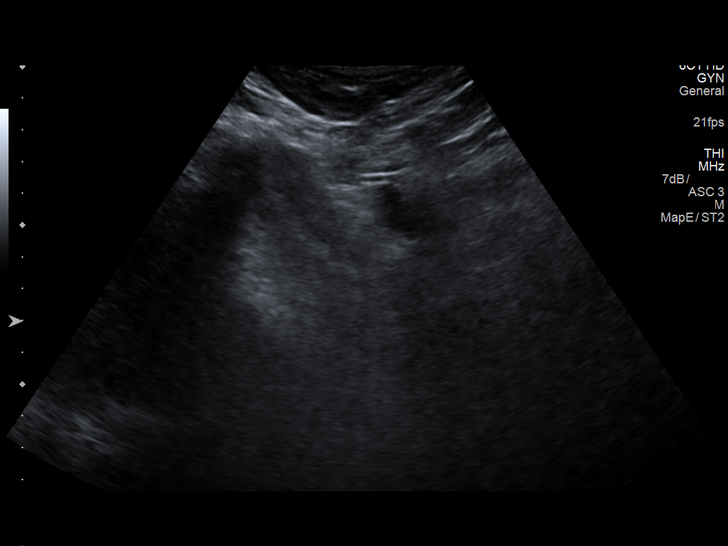
[im 12/47]
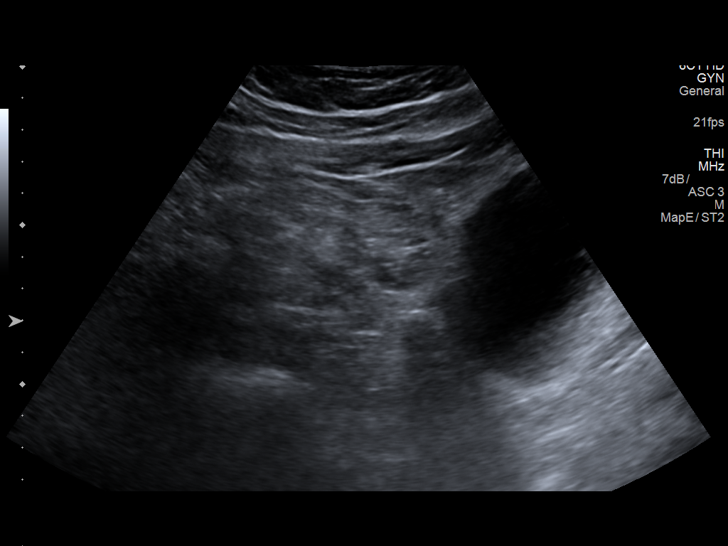
[im 16/47]
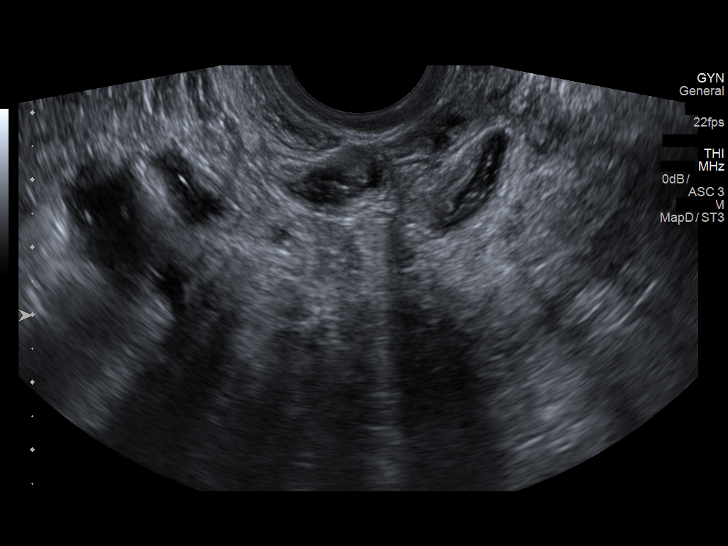
[im 18/47]
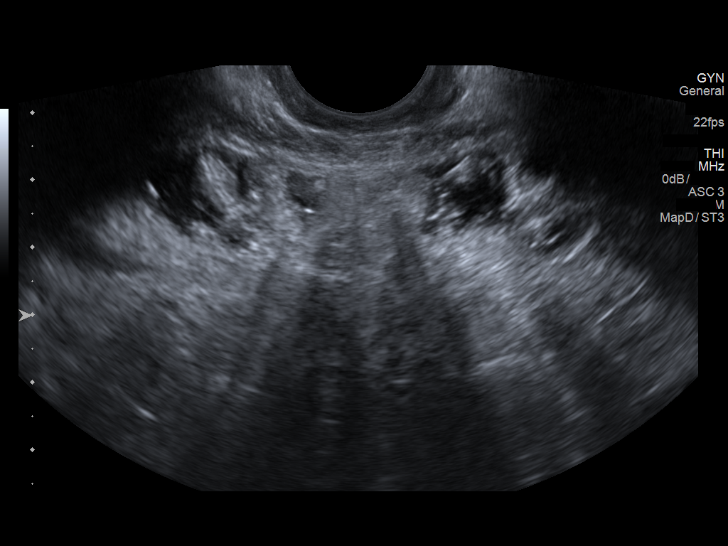
[im 22/47]
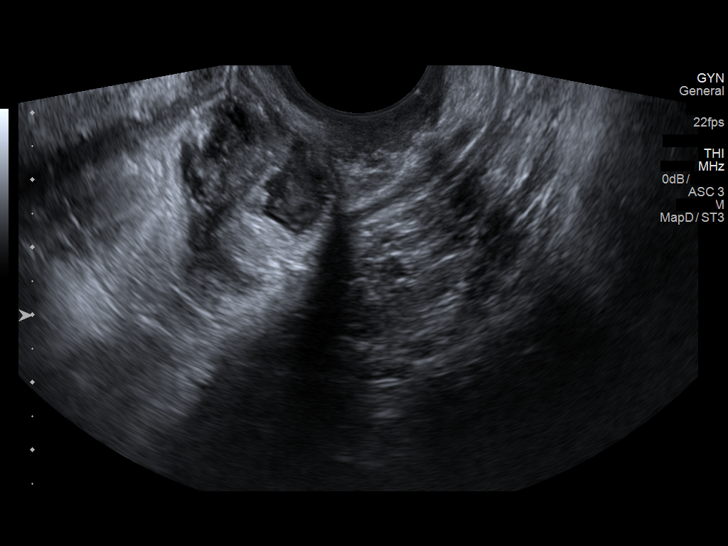
[im 25/47]
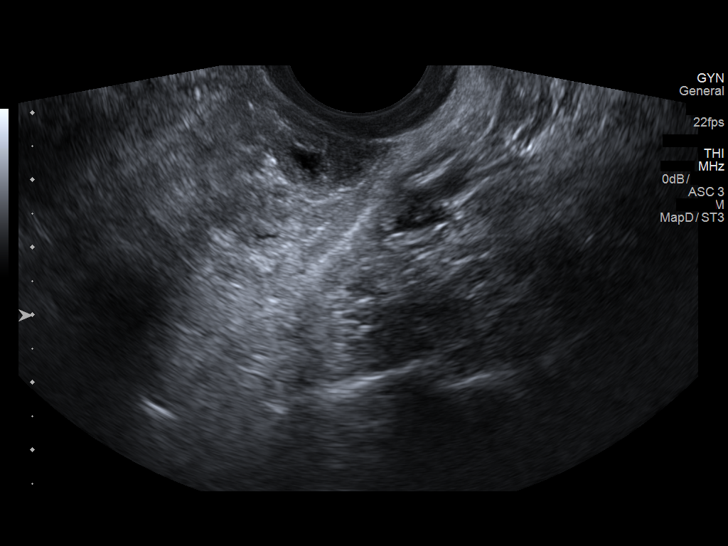
[im 29/47]
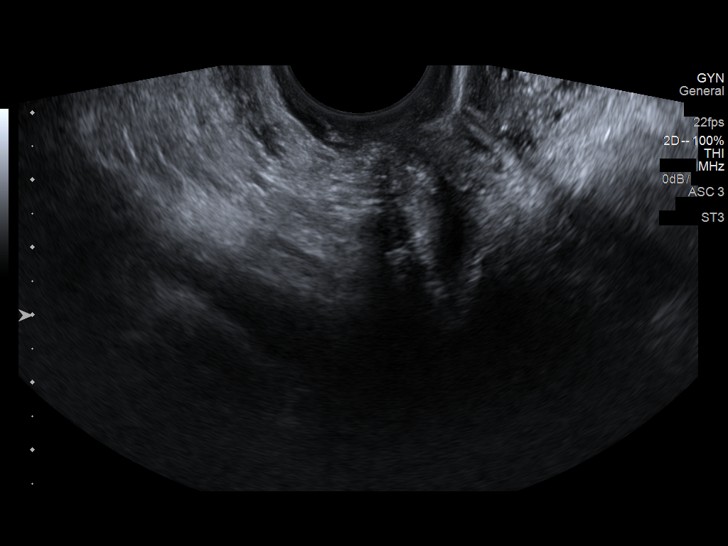
[im 31/47]
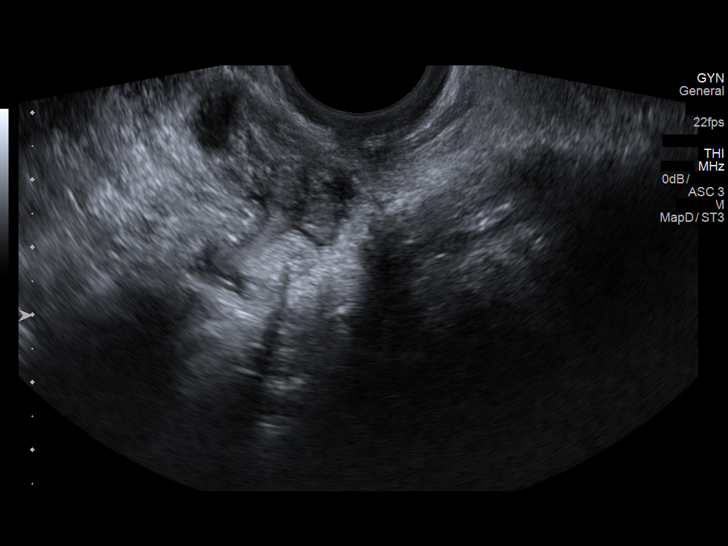
[im 35/47]
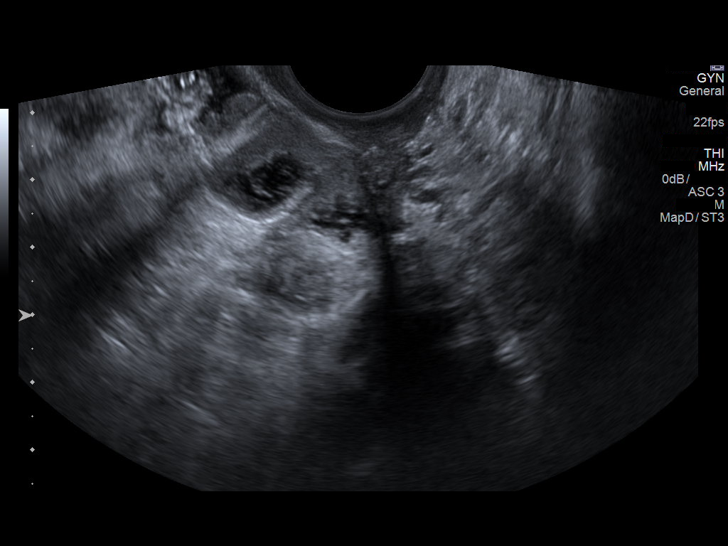
[im 39/47]
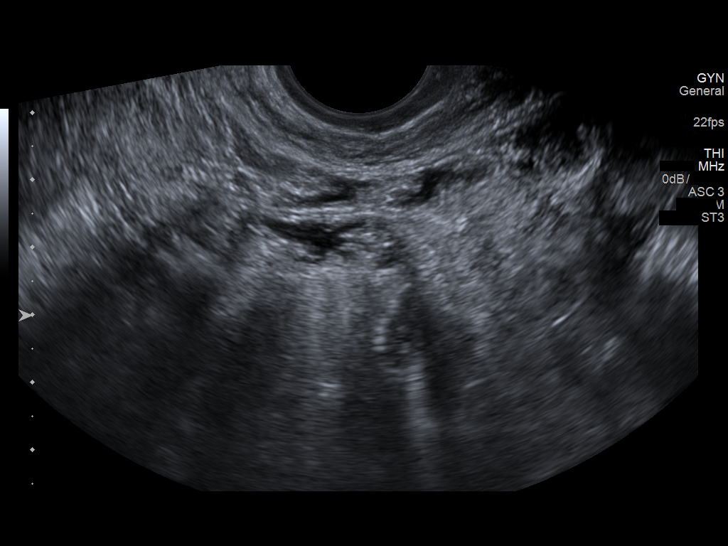
[im 43/47]
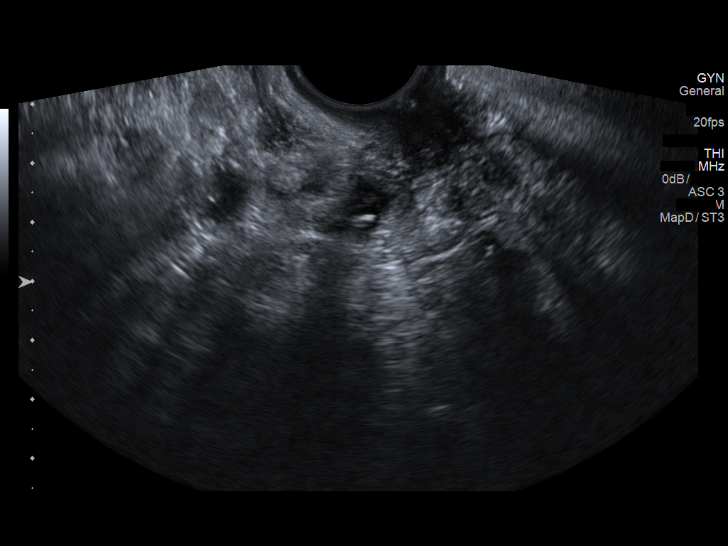
[im 47/47]
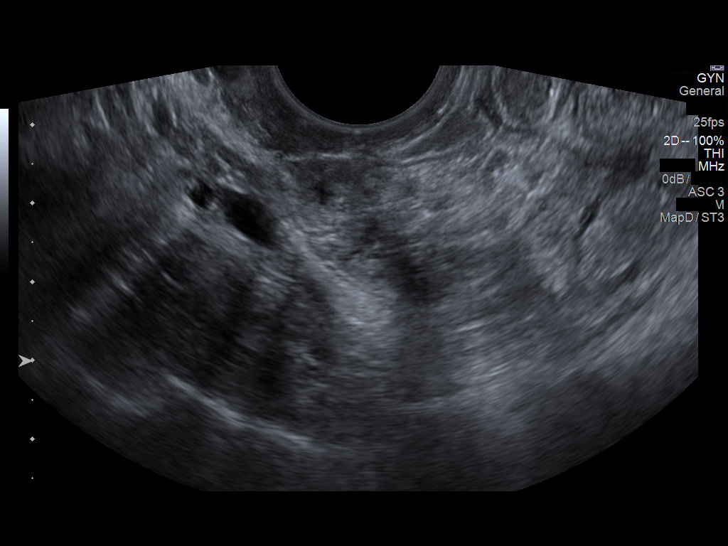

[14 of 25 positions shown; findings below may reference images not displayed]

FINDINGS: Uterus

Removed

Right ovary

Not visualized.

Left ovary

Not visualized.

Other findings

No abnormal free fluid.
IMPRESSION: Negative pelvic ultrasound. Uterus has been removed. The ovaries
could not be identified. No definable mass or free fluid or other
abnormality in the pelvis.

## 2017-11-15 ENCOUNTER — Telehealth: Payer: Self-pay | Admitting: Neurology

## 2017-11-15 NOTE — Telephone Encounter (Signed)
Katina [email or web address]  asking to speak with RN for Dr Krista Blue re: this pt's Dysport. Please call [phone]

## 2017-11-19 NOTE — Telephone Encounter (Signed)
I spoke with Courtney Myers who wanted to know if the patient was still receiving therapy. I informed her that she was.

## 2017-11-29 ENCOUNTER — Other Ambulatory Visit: Payer: Self-pay | Admitting: *Deleted

## 2017-11-29 MED ORDER — "TUBERCULIN SYRINGE 27G X 1/2"" 1 ML MISC": 11 refills | Status: DC

## 2017-11-29 NOTE — Telephone Encounter (Signed)
Refill request received via fax  Last Visit: 09/27/17 Next Visit: 03/06/18  Okay to refill per Dr. Estanislado Pandy

## 2017-12-19 ENCOUNTER — Encounter: Payer: Self-pay | Admitting: Neurology

## 2017-12-19 ENCOUNTER — Ambulatory Visit: Payer: 59 | Admitting: Neurology

## 2017-12-19 VITALS — BP 116/78 | HR 98 | Ht 67.0 in | Wt 177.0 lb

## 2017-12-19 DIAGNOSIS — G243 Spasmodic torticollis: Secondary | ICD-10-CM | POA: Diagnosis not present

## 2017-12-19 NOTE — Progress Notes (Signed)
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  Symptom onset was without apparent triggering event, she has gradually developed this neck pulling,  manifested primarily with right laterocollis, mild retrocolis, and left shoulder elevation since 2000. She has good relief with regular Botox injection, about 4 times a year since 2003. Last injection was February 18, 2010 by Dr. Marta Antu,  She reported great relief as usual, taking 4- 5 days to get symptom relieve, lasting about 3 months.  I began to inject her since 12.2011, every 3-4 months, dosage has been limited to 150 units of BOTOX A.  She denies neck pain, gait difficulty, but with wearing off Botox, she noticed increased head tremor, and pulling towards her right shoulder.  She developed  transient difficulty lifting her neck up when using BOTOX A 200 units previously, reponding well to decreased dose of 150 units  She was enrolled into IPSEN dysport study, first injection was in April 30th 2013, she later enrolled into open label, 2nd injection was in  May 28th 2013. She did very well.  Recent few weeks,  She had experienced flareup of her rheumatoid arthritis, has received IV infusion. Last injection was in 06/10/2012,  500 units of dysport was dissolved into 2 cc of normal saline. Right longissimus capitus 0.5 cc Right splenius capitis 0.5 cc Right levator scapular 0.5 cc Right iliocostalis 0.5 cc   Dysport research study has completed, she is now coming back for repeat injection, she noticed head bobbing over the past few weeks, she denies significant neck pain, weakness,  Her rheumatoid arthritis is under better control   Last injection was in Sep 2014, she did very well, no neck extension weakness, only rarely neck shaking  UPDATE March 8th 2016: She has lost follow-up since her last visit December 2014,  She came in for treatment her cervical dystonia, she feel the tension of her neck all the time, stiffness. Denied gait difficulty, last injection was September 2014, she received 500 units of Dysport, works well for her, she is continue on medication for anxiety, rheumatoid arthritis She also complains of nighttime snoring, frequent awakening, excessive daytime sleepiness, fatigue, today's ESS is 6, FSS was 72,  UPDATE October 13 2014:   She has missed her scheduled sleep study, continue have worsening neck posturing, posterior neck pain, excessive daytime fatigue and sleepiness  UPDATE Nov 11 2014: She came in for EMG guided Dysport injection today, last injection was December 2014, she complains of worsening head titubation, bilateral hands mild postural tremor, posterior neck pain,  Update February 17 2015 She responded very well to last EMG guided Dysport injection Nov 11 2014, no significant head titubation, no significant neck pain  Update July 22 2015: Last injection was August, now she noticed returning of neck pulling and shaking, posterior neck muscle achy pain  Update October 21 2015: Last EMG guided Dysport injection was in January, she responded very well, only recent couple weeks, she noticed returning of neck pulling, shaking again, She has worsening rheumatoid arthritis, is receiving more frequent treatment, complains fatigue, diffuse body achy pain  Update March 15 2016: She responded very well to previous Dysport injection in April, no significant side effect noticed, only recent few weeks, 5 months from previous injection, she noticed return of head shaking neck muscle tightness.  UPDATE Dec 14th 2017: She responded very well  to previous injection in Sept 2017, no significant side effect noticed.  Update September 13 2016: She did well this last injection in December  Update January 15 2017: She noticed worsening head tremor, she had excessive stress, her sister passed away  from lung cancer in April 2018,  UPDATE Apr 25 2017: She responded very well with previous injection, barely has noticeable head shaking.  Update August 01, 2017: She responded very well to previous dysport injection, only recently began to have recurrent head shaking  UPDATE December 19 2017: She responded well to previous injection  REVIEW OF SYSTEMS: Full 14 system review of systems performed and notable only for ear pain, joint pain, joint swelling, achy muscles, muscle cramps, rash, tremors, depression, anxiety  ALLERGIES: No Known Allergies  HOME MEDICATIONS: Current Outpatient Medications  Medication Sig Dispense Refill  . AbobotulinumtoxinA (DYSPORT) 500 units SOLR injection Inject 500 Units into the muscle every 3 (three) months. 1 each 3  . acetaminophen (TYLENOL) 325 MG tablet Take 650 mg by mouth once. One hour prior to infusion    . albuterol (PROVENTIL HFA;VENTOLIN HFA) 108 (90 Base) MCG/ACT inhaler Inhale 2 puffs into the lungs every 6 (six) hours as needed for wheezing or shortness of breath. 1 Inhaler 0  . baclofen (LIORESAL) 10 MG tablet Take 10 mg by mouth daily as needed for muscle spasms.     . benzonatate (TESSALON) 100 MG capsule Take 1 capsule (100 mg total) by mouth 3 (three) times daily as needed for cough. 30 capsule 0  . BIOTIN PO Take by mouth daily.    . clonazePAM (KLONOPIN) 0.5 MG tablet TAKE 1 TABLET BY MOUTH TWICE DAILY IF NEEDED FOR ANXIETY 60 tablet 5  . diphenhydrAMINE (BENADRYL) 25 mg capsule Take 25 mg by mouth once. One hour prior to infusion    . DULoxetine (CYMBALTA) 60 MG capsule Take 1 capsule (60 mg total) by mouth daily. 30 capsule 5  . folic acid (FOLVITE) 1 MG tablet Take 1 tablet (1 mg total) by mouth daily. 90 tablet 4  . inFLIXimab (REMICADE) 100 MG injection Inject 6 mg/kg into the vein every 6 (six) weeks. Infuse 500 mg/ 5 vials in 232mL NS over 2hours every 6 weeks    . methotrexate 50 MG/2ML injection INJECT 0.3 MILLILITERS INTO SKIN  ONCE A WEEK 4 mL 0  . Multiple Vitamins-Minerals (MULTIVITAMINS THER. W/MINERALS) TABS Take 1 tablet by mouth daily.    Marland Kitchen omeprazole (PRILOSEC) 20 MG capsule TAKE 1 CAPSULE BY MOUTH ONCE DAILY 90 capsule 1  . Probiotic Product (PROBIOTIC ADVANCED PO) Take by mouth.    . traMADol (ULTRAM) 50 MG tablet Take 1 - 2 every 8 hours as needed for pain. 30 tablet 0  . TUBERCULIN SYR 1CC/27GX1/2" (B-D TB SYRINGE 1CC/27GX1/2") 27G X 1/2" 1 ML MISC use 1 TB syringe once weekly 4 each 11  . TURMERIC PO Take by mouth daily.    . Xylitol (XYLIMELTS MT) Use as directed in the mouth or throat.     No current facility-administered medications for this visit.     PAST MEDICAL HISTORY: Past Medical History:  Diagnosis Date  . Anxiety   . Cervical dystonia   . Depression   . Fibromyalgia   . GERD (gastroesophageal reflux disease)   . History of shingles   . RA (rheumatoid arthritis) ([place])   . Synovitis of hand     PAST SURGICAL HISTORY: Past Surgical History:  Procedure Laterality Date  . TONSILLECTOMY    .  VAGINAL HYSTERECTOMY  3/08    FAMILY HISTORY: Family History  Problem Relation Age of Onset  . Arthritis Mother   . Heart Problems Father   . Heart disease Father   . Heart disease Paternal Uncle        MI    SOCIAL HISTORY:  Social History   Socioeconomic History  . Marital status: Married    Spouse name: [name]  . Number of children: 2  . Years of education: 18  . Highest education level: Not on file  Occupational History    Employer: [employer]  . Financial resource strain: Not on file  . Food insecurity:    Worry: Not on file    Inability: Not on file  . Transportation needs:    Medical: Not on file    Non-medical: Not on file  Tobacco Use  . Smoking status: Former Smoker    Packs/day: 0.50    Years: 30.00    Pack years: 15.00    Types: Cigarettes    Last attempt to quit: 05/02/2015    Years since quitting: 2.6  . Smokeless tobacco: Never Used   Substance and Sexual Activity  . Alcohol use: Yes    Alcohol/week: 0.0 oz    Comment: Rare  . Drug use: Never  . Sexual activity: Yes    Birth control/protection: Surgical    Comment: 1st intercourse 57 yo-Fewer than 5 partners  Lifestyle  . Physical activity:    Days per week: Not on file    Minutes per session: Not on file  . Stress: Not on file  Relationships  . Social connections:    Talks on phone: Not on file    Gets together: Not on file    Attends religious service: Not on file    Active member of club or organization: Not on file    Attends meetings of clubs or organizations: Not on file    Relationship status: Not on file  . Intimate partner violence:    Fear of current or ex partner: Not on file    Emotionally abused: Not on file    Physically abused: Not on file    Forced sexual activity: Not on file  Other Topics Concern  . 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 Right handed.    Caffeine- 4 cups daily.     PHYSICAL EXAM   Vitals:   12/19/17 1458  Height: 5\' 7"  (1.702 m)    Not recorded      Body mass index is 28.51 kg/m.  PHYSICAL EXAMNIATION: MENTAL STATUS: Speech is normal, normal to casual conversation, and history taking  CRANIAL NERVES: mild retrocollis, right tilt, mild right lateral shift, slight left shoulder elevation  MOTOR: Normal tone and bulk and strength COORDINATION: No dysmetria   GAIT/STANCE: Posture is normal.  DIAGNOSTIC DATA (LABS, IMAGING, TESTING) - I reviewed patient records, labs, notes, testing and imaging myself where available.  ASSESSMENT AND PLAN  MARYLYN APPENZELLER is a 57 y.o. female with long-standing history of cervical dystonia, responded very well to previous EMG guided Dysport injection, last injection was May 2016, responded very well.   Mild retrocollis, right tilt, mild right  lateral shift, slight left shoulder elevation  500 of Dysport was dissolved into 2.5 cc of normal saline,  Right longissimus capitus 0. 5 cc Left splenius capitis 0.5  cc Right inferior oblique capitis, 0.5 cc  Left inferior oblique capitis 0.5 cc Left levator scapular 0.5   Patient tolerate the injection well, will return to clinic in 3 months for repeat injection Please dissolve 500 units of Dysport into 2.5 cc of normal saline   Marcial Pacas, M.D. Ph.D.  [facility] [address] [place], [place] [address] Ph: [phone] Fax: [phone]

## 2017-12-19 NOTE — Progress Notes (Signed)
**  Dysport 500 units x 1 vials, [place] [address]-8, Lot [id number], Exp 05/02/2018, specialty pharmacy.//mck,rn**

## 2018-01-02 ENCOUNTER — Encounter (HOSPITAL_COMMUNITY): Payer: 59

## 2018-01-04 ENCOUNTER — Ambulatory Visit (HOSPITAL_COMMUNITY)
Admission: RE | Admit: 2018-01-04 | Discharge: 2018-01-04 | Disposition: A | Discharge status: Home / Self Care | Payer: 59 | Source: Ambulatory Visit | Attending: Rheumatology | Admitting: Rheumatology

## 2018-01-04 ENCOUNTER — Other Ambulatory Visit: Payer: Self-pay | Admitting: *Deleted

## 2018-01-04 DIAGNOSIS — M0609 Rheumatoid arthritis without rheumatoid factor, multiple sites: Secondary | ICD-10-CM | POA: Diagnosis not present

## 2018-01-04 LAB — COMPREHENSIVE METABOLIC PANEL
ALT: 21 U/L (ref 0–44)
AST: 25 U/L (ref 15–41)
Albumin: 3.9 g/dL (ref 3.5–5.0)
Alkaline Phosphatase: 74 U/L (ref 38–126)
Anion gap: 11 (ref 5–15)
BUN: 13 mg/dL (ref 6–20)
CO2: 22 mmol/L (ref 22–32)
Calcium: 9.1 mg/dL (ref 8.9–10.3)
Chloride: 103 mmol/L (ref 98–111)
Creatinine, Ser: 0.8 mg/dL (ref 0.44–1.00)
GFR calc Af Amer: >60 mL/min (ref >60)
GFR calc non Af Amer: >60 mL/min (ref >60)
Glucose, Bld: 103 mg/dL — ABNORMAL HIGH (ref 70–99)
Potassium: 3.8 mmol/L (ref 3.5–5.1)
Sodium: 136 mmol/L (ref 135–145)
Total Bilirubin: 0.5 mg/dL (ref 0.3–1.2)
Total Protein: 6.9 g/dL (ref 6.5–8.1)

## 2018-01-04 LAB — CBC
HCT: 40.9 % (ref 36.0–46.0)
Hemoglobin: 13.3 g/dL (ref 12.0–15.0)
MCH: 29.9 pg (ref 26.0–34.0)
MCHC: 32.5 g/dL (ref 30.0–36.0)
MCV: 91.9 fL (ref 78.0–100.0)
Platelets: 246 10*3/uL (ref 150–400)
RBC: 4.45 MIL/uL (ref 3.87–5.11)
RDW: 14.5 % (ref 11.5–15.5)
WBC: 8.7 10*3/uL (ref 4.0–10.5)

## 2018-01-04 MED ORDER — ACETAMINOPHEN 325 MG PO TABS
ORAL_TABLET | ORAL | Status: AC
  Administered 2018-01-04: 650 mg via ORAL
  Filled 2018-01-04: qty 2

## 2018-01-04 MED ORDER — DIPHENHYDRAMINE HCL 25 MG PO CAPS
ORAL_CAPSULE | ORAL | Status: AC
  Administered 2018-01-04: 25 mg via ORAL
  Filled 2018-01-04: qty 1

## 2018-01-04 MED ORDER — DIPHENHYDRAMINE HCL 25 MG PO CAPS
25.0000 mg | ORAL_CAPSULE | ORAL | Status: DC
  Administered 2018-01-04: 25 mg via ORAL

## 2018-01-04 MED ORDER — SODIUM CHLORIDE 0.9 % IV SOLN
6.0000 mg/kg | INTRAVENOUS | Status: DC
  Administered 2018-01-04: 500 mg via INTRAVENOUS
  Filled 2018-01-04: qty 50

## 2018-01-04 MED ORDER — SODIUM CHLORIDE 0.9 % IV SOLN
INTRAVENOUS | Status: DC
  Administered 2018-01-04: 09:00:00 via INTRAVENOUS

## 2018-01-04 MED ORDER — ACETAMINOPHEN 325 MG PO TABS
650.0000 mg | ORAL_TABLET | ORAL | Status: DC
  Administered 2018-01-04: 650 mg via ORAL

## 2018-01-09 ENCOUNTER — Other Ambulatory Visit: Payer: Self-pay | Admitting: Rheumatology

## 2018-01-09 NOTE — Telephone Encounter (Signed)
Last Visit: 09/27/17 Next Visit: 03/06/18  Okay to refill per Dr. Estanislado Pandy

## 2018-01-16 ENCOUNTER — Other Ambulatory Visit: Payer: Self-pay | Admitting: Neurology

## 2018-01-21 ENCOUNTER — Other Ambulatory Visit: Payer: Self-pay | Admitting: *Deleted

## 2018-01-21 DIAGNOSIS — Z79899 Other long term (current) drug therapy: Secondary | ICD-10-CM

## 2018-01-21 DIAGNOSIS — M0609 Rheumatoid arthritis without rheumatoid factor, multiple sites: Secondary | ICD-10-CM

## 2018-01-30 ENCOUNTER — Ambulatory Visit: Payer: 59 | Admitting: Neurology

## 2018-02-11 ENCOUNTER — Other Ambulatory Visit: Payer: Self-pay | Admitting: Rheumatology

## 2018-02-11 NOTE — Telephone Encounter (Signed)
Last visit: 09/27/2017 Next visit: 03/06/2018  Okay to refill per Dr. Estanislado Pandy.

## 2018-02-13 ENCOUNTER — Other Ambulatory Visit (HOSPITAL_COMMUNITY): Payer: Self-pay | Admitting: *Deleted

## 2018-02-14 ENCOUNTER — Ambulatory Visit (HOSPITAL_COMMUNITY)
Admission: RE | Admit: 2018-02-14 | Discharge: 2018-02-14 | Disposition: A | Discharge status: Home / Self Care | Payer: 59 | Source: Ambulatory Visit | Attending: Rheumatology | Admitting: Rheumatology

## 2018-02-14 DIAGNOSIS — M0609 Rheumatoid arthritis without rheumatoid factor, multiple sites: Secondary | ICD-10-CM | POA: Diagnosis not present

## 2018-02-14 LAB — COMPREHENSIVE METABOLIC PANEL
ALT: 20 U/L (ref 0–44)
AST: 42 U/L — ABNORMAL HIGH (ref 15–41)
Albumin: 3.4 g/dL — ABNORMAL LOW (ref 3.5–5.0)
Alkaline Phosphatase: 73 U/L (ref 38–126)
Anion gap: 6 (ref 5–15)
BUN: 8 mg/dL (ref 6–20)
CO2: 27 mmol/L (ref 22–32)
Calcium: 9 mg/dL (ref 8.9–10.3)
Chloride: 104 mmol/L (ref 98–111)
Creatinine, Ser: 0.79 mg/dL (ref 0.44–1.00)
GFR calc Af Amer: >60 mL/min (ref >60)
GFR calc non Af Amer: >60 mL/min (ref >60)
Glucose, Bld: 102 mg/dL — ABNORMAL HIGH (ref 70–99)
Potassium: 4.7 mmol/L (ref 3.5–5.1)
Sodium: 137 mmol/L (ref 135–145)
Total Bilirubin: 1.4 mg/dL — ABNORMAL HIGH (ref 0.3–1.2)
Total Protein: 6.4 g/dL — ABNORMAL LOW (ref 6.5–8.1)

## 2018-02-14 LAB — CBC
HCT: 38.9 % (ref 36.0–46.0)
Hemoglobin: 12.6 g/dL (ref 12.0–15.0)
MCH: 30 pg (ref 26.0–34.0)
MCHC: 32.4 g/dL (ref 30.0–36.0)
MCV: 92.6 fL (ref 78.0–100.0)
Platelets: 303 10*3/uL (ref 150–400)
RBC: 4.2 MIL/uL (ref 3.87–5.11)
RDW: 14.1 % (ref 11.5–15.5)
WBC: 10.2 10*3/uL (ref 4.0–10.5)

## 2018-02-14 MED ORDER — DIPHENHYDRAMINE HCL 25 MG PO CAPS: 25.0000 mg | ORAL_CAPSULE | ORAL | Status: DC

## 2018-02-14 MED ORDER — ACETAMINOPHEN 325 MG PO TABS: 650.0000 mg | ORAL_TABLET | ORAL | Status: DC

## 2018-02-14 MED ORDER — SODIUM CHLORIDE 0.9 % IV SOLN
6.0000 mg/kg | INTRAVENOUS | Status: DC
  Administered 2018-02-14: 500 mg via INTRAVENOUS
  Filled 2018-02-14: qty 50

## 2018-02-14 NOTE — Progress Notes (Signed)
Labs are stable.

## 2018-02-20 LAB — QUANTIFERON-TB GOLD PLUS (RQFGPL)
QuantiFERON Mitogen Value: 4.84 IU/mL
QuantiFERON Nil Value: 0.03 IU/mL
QuantiFERON TB1 Ag Value: 0.03 IU/mL
QuantiFERON TB2 Ag Value: 0.02 IU/mL

## 2018-02-20 LAB — QUANTIFERON-TB GOLD PLUS: QuantiFERON-TB Gold Plus: NEGATIVE

## 2018-02-20 NOTE — Progress Notes (Signed)
Office Visit Note  Patient: Courtney Myers             Date of Birth: 1960-10-27           MRN: [id number]             PCP: Rennis Golden Referring: Orlena Sheldon, PA-C Visit Date: 03/06/2018 Occupation: @GUAROCC @  Subjective:  Medication monitoring   History of Present Illness: Courtney Myers is a 57 y.o. female with history of seronegative rheumatoid arthritis and fibromyalgia.  She is on Remicade infusions every 6 weeks, MTX 0.3 ml once a week, and folic acid 1 mg daily. She reports she had a flare in both thumbs in 1 week ago that lasted 48 hours.  She states she had pain and swelling in both thumbs.  She feels stress at work led to the flare.  She reports that she also noticed a nodule form on her right elbow that is now resolved.  She states that she feels significant fatigue following her Remicade infusions.  She states that she takes her methotrexate injections every Friday.  She denies any other joint pain or joint swelling at this time.  She states that if she  does not sleep well at night she develops a fibromyalgia flare within 1 to 2 days.  She states that she occasionally has right shoulder pain at night that wakes her up at night.   Activities of Daily Living:  Patient reports morning stiffness for 15-20  minutes.   Patient Reports nocturnal pain.  Difficulty dressing/grooming: Denies Difficulty climbing stairs: Denies Difficulty getting out of chair: Reports Difficulty using hands for taps, buttons, cutlery, and/or writing: Denies  Review of Systems  Constitutional: Positive for fatigue.  HENT: Positive for mouth dryness. Negative for mouth sores and nose dryness.   Eyes: Positive for dryness. Negative for pain and visual disturbance.  Respiratory: Negative for cough, hemoptysis, shortness of breath and difficulty breathing.   Cardiovascular: Negative for chest pain, palpitations, hypertension and swelling in legs/feet.  Gastrointestinal: Positive for diarrhea.  Negative for blood in stool and constipation.  Endocrine: Negative for increased urination.  Genitourinary: Negative for painful urination.  Musculoskeletal: Positive for arthralgias, joint pain, myalgias, morning stiffness, muscle tenderness and myalgias. Negative for joint swelling and muscle weakness.  Skin: Negative for color change, pallor, rash, hair loss, nodules/bumps, skin tightness, ulcers and sensitivity to sunlight.  Allergic/Immunologic: Negative for susceptible to infections.  Neurological: Negative for dizziness, numbness, headaches and weakness.  Hematological: Negative for swollen glands.  Psychiatric/Behavioral: Positive for depressed mood and sleep disturbance. The patient is nervous/anxious.     PMFS History:  Patient Active Problem List   Diagnosis Date Noted  . Excessive sleepiness 09/08/2014  . Fibromyalgia syndrome 08/25/2014  . RA (rheumatoid arthritis) ([place])   . GERD (gastroesophageal reflux disease)   . Anxiety   . Depression   . History of shingles   . Synovitis of hand   . Cervical dystonia 12/04/2012    Past Medical History:  Diagnosis Date  . Anxiety   . Cervical dystonia   . Depression   . Fibromyalgia   . GERD (gastroesophageal reflux disease)   . History of shingles   . RA (rheumatoid arthritis) ([place])   . Synovitis of hand     Family History  Problem Relation Age of Onset  . Arthritis Mother   . Heart Problems Father   . Heart disease Father   . Heart disease Paternal Uncle  MI   Past Surgical History:  Procedure Laterality Date  . TONSILLECTOMY    . 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 Right handed.    Caffeine- 4 cups daily.    Objective: Vital Signs: BP 109/69 (BP Location: Left Arm, Patient Position: Sitting, Cuff Size: Normal)   Pulse 80   Resp 14   Ht 5\' 6"   (1.676 m)   Wt 179 lb 3.2 oz (81.3 kg)   BMI 28.92 kg/m    Physical Exam  Constitutional: She is oriented to person, place, and time. She appears well-developed and well-nourished.  HENT:  Head: Normocephalic and atraumatic.  Eyes: Conjunctivae and EOM are normal.  Neck: Normal range of motion.  Cardiovascular: Normal rate, regular rhythm, normal heart sounds and intact distal pulses.  Pulmonary/Chest: Effort normal and breath sounds normal.  Abdominal: Soft. Bowel sounds are normal.  Lymphadenopathy:    She has no cervical adenopathy.  Neurological: She is alert and oriented to person, place, and time.  Skin: Skin is warm and dry. Capillary refill takes less than 2 seconds.  Psychiatric: She has a normal mood and affect. Her behavior is normal.  Nursing note and vitals reviewed.    Musculoskeletal Exam: C-spine, thoracic spine, and lumbar spine good ROM.  Shoulder joints, elbow joints, wrist joints, MCPs, PIPs, and DIPs good ROM with no synovitis.  Complete fist formation bilaterally.  Hip joints, knee joints, ankle joints, MTPs, PIPs, and DIPs good ROM with no synovitis.  No warmth or effusion of knee joints.    CDAI Exam: CDAI Score: 0.6  Patient Global Assessment: 3 (mm); Provider Global Assessment: 3 (mm) Swollen: 0 ; Tender: 0  Joint Exam   Not documented   There is currently no information documented on the homunculus. Go to the Rheumatology activity and complete the homunculus joint exam.  Investigation: No additional findings.  Imaging: No results found.  Recent Labs: Lab Results  Component Value Date   WBC 10.2 02/14/2018   HGB 12.6 02/14/2018   PLT 303 02/14/2018   NA 137 02/14/2018   K 4.7 02/14/2018   CL 104 02/14/2018   CO2 27 02/14/2018   GLUCOSE 102 (H) 02/14/2018   BUN 8 02/14/2018   CREATININE 0.79 02/14/2018   BILITOT 1.4 (H) 02/14/2018   ALKPHOS 73 02/14/2018   AST 42 (H) 02/14/2018   ALT 20 02/14/2018   PROT 6.4 (L) 02/14/2018   ALBUMIN  3.4 (L) 02/14/2018   CALCIUM 9.0 02/14/2018   GFRAA >60 02/14/2018   QFTBGOLD Negative 09/14/2015   QFTBGOLDPLUS Negative 02/14/2018    Speciality Comments: Remicade 6mg /kg every 6 weeks  TB gold negative 10/27/16  Procedures:  No procedures performed Allergies: Patient has no known allergies.   Assessment / Plan:     Visit Diagnoses: Rheumatoid arthritis of multiple sites with negative rheumatoid factor ([place]): She has no synovitis on exam today.  She reports that she had a flare 1 week ago that lasted 48 hours in bilateral first MCP joints.  She has no tenderness or swelling on exam.  She has no joint pain at this time.  She is clinically doing well on Remicade infusions every 6 weeks and methotrexate 0.3 mL subcutaneously every week.  She does not need any refills at this time.  She has noted significant benefit since taking turmeric by mouth daily.  Association of heart disease with rheumatoid arthritis was discussed. Need to monitor blood pressure, cholesterol, and to exercise 30-60 minutes on daily basis was discussed. Poor dental hygiene can be a predisposing factor for rheumatoid arthritis. Good dental hygiene was discussed. She will follow-up in the office in 5 months.  She was advised to notify us if she develops increased joint pain or joint swelling.  High risk medication use -  Remicade 6mg /Kg q 6 wks and MTX 3.8GT sq qwk , Folic acid 1mg  po qd. She has labs checked with infusions.  Fibromyalgia: She has intermittent fibromyalgia flares following an unrestful night sleep.  She has generalized muscle aches and muscle tenderness due to fibromyalgia.  She is on Cymbalta 60 mg by mouth daily.  She takes tramadol 50 mg 1 to 2 tablets every 8 hours as needed for pain relief.  She was encouraged to try to exercise 30 to 60 minutes daily.  Primary insomnia: Chronic.  Good sleep hygiene was discussed.    Chronic fatigue: Chronic.   Anxiety and depression: She is on Cymbalta 60 mg by mouth  daily.  History of gastroesophageal reflux (GERD)   Orders: No orders of the defined types were placed in this encounter.  No orders of the defined types were placed in this encounter.    Follow-Up Instructions: Return in about 5 months (around 08/06/2018) for Rheumatoid arthritis, Fibromyalgia.   Ofilia Neas, PA-C  Note - This record has been created using Dragon software.  Chart creation errors have been sought, but may not always  have been located. Such creation errors do not reflect on  the standard of medical care.

## 2018-02-26 ENCOUNTER — Other Ambulatory Visit: Payer: Self-pay | Admitting: *Deleted

## 2018-02-26 NOTE — Progress Notes (Signed)
Infusion orders are current for patient CBC CMP Tylenol Benadryl appointments are up to date and follow up appointment  is scheduled TB gold not due yet.  

## 2018-03-06 ENCOUNTER — Encounter: Payer: Self-pay | Admitting: Physician Assistant

## 2018-03-06 ENCOUNTER — Ambulatory Visit: Payer: 59 | Admitting: Physician Assistant

## 2018-03-06 VITALS — BP 109/69 | HR 80 | Resp 14 | Ht 66.0 in | Wt 179.2 lb

## 2018-03-06 DIAGNOSIS — Z79899 Other long term (current) drug therapy: Secondary | ICD-10-CM

## 2018-03-06 DIAGNOSIS — M0609 Rheumatoid arthritis without rheumatoid factor, multiple sites: Secondary | ICD-10-CM

## 2018-03-06 DIAGNOSIS — M797 Fibromyalgia: Secondary | ICD-10-CM | POA: Diagnosis not present

## 2018-03-06 DIAGNOSIS — Z8719 Personal history of other diseases of the digestive system: Secondary | ICD-10-CM

## 2018-03-06 DIAGNOSIS — F5101 Primary insomnia: Secondary | ICD-10-CM | POA: Diagnosis not present

## 2018-03-06 DIAGNOSIS — F329 Major depressive disorder, single episode, unspecified: Secondary | ICD-10-CM

## 2018-03-06 DIAGNOSIS — R5382 Chronic fatigue, unspecified: Secondary | ICD-10-CM

## 2018-03-06 DIAGNOSIS — F32A Depression, unspecified: Secondary | ICD-10-CM

## 2018-03-06 DIAGNOSIS — F419 Anxiety disorder, unspecified: Secondary | ICD-10-CM

## 2018-03-06 NOTE — Patient Instructions (Addendum)
Standing Labs We placed an order today for your standing lab work.    Please come back and get your standing labs in November and every 3 months   We have open lab Monday through Friday from 8:30-11:30 AM and 1:30-4:00 PM  at the office of Dr. Bo Merino.   You may experience shorter wait times on Monday and Friday afternoons. The office is located at [address] [place], [place],  [address] No appointment is necessary.   Labs are drawn by [facility].  You may receive a bill from [facility] for your lab work. If you have any questions regarding directions or hours of operation,  please call [phone].     Association of heart disease with rheumatoid arthritis was discussed. Need to monitor blood pressure, cholesterol, and to exercise 30-60 minutes on daily basis was discussed. Poor dental hygiene can be a predisposing factor for rheumatoid arthritis. Good dental hygiene was discussed.

## 2018-03-12 ENCOUNTER — Telehealth: Payer: Self-pay | Admitting: Neurology

## 2018-03-12 NOTE — Telephone Encounter (Signed)
Refill request [facility] for Dysport [phone].

## 2018-03-12 NOTE — Telephone Encounter (Signed)
TBD for 03/19/18. They are going to complete a medical review they said it will take 3 to 5 business days.

## 2018-03-14 ENCOUNTER — Other Ambulatory Visit: Payer: Self-pay | Admitting: Rheumatology

## 2018-03-14 ENCOUNTER — Other Ambulatory Visit: Payer: Self-pay | Admitting: Physician Assistant

## 2018-03-14 ENCOUNTER — Other Ambulatory Visit: Payer: Self-pay | Admitting: Neurology

## 2018-03-14 DIAGNOSIS — K219 Gastro-esophageal reflux disease without esophagitis: Secondary | ICD-10-CM

## 2018-03-14 NOTE — Telephone Encounter (Signed)
Last visit: 03/06/18 Next visit: 08/15/18  Okay to refill per Dr. Deveshwar 

## 2018-03-21 ENCOUNTER — Telehealth: Payer: Self-pay | Admitting: Neurology

## 2018-03-21 DIAGNOSIS — G243 Spasmodic torticollis: Secondary | ICD-10-CM | POA: Diagnosis not present

## 2018-03-21 NOTE — Telephone Encounter (Signed)
Dysport TBD 03/26/2018 [facility] .

## 2018-03-21 NOTE — Telephone Encounter (Signed)
Noted, thank you

## 2018-03-26 ENCOUNTER — Telehealth: Payer: Self-pay | Admitting: Neurology

## 2018-03-27 ENCOUNTER — Encounter: Payer: Self-pay | Admitting: Neurology

## 2018-03-27 ENCOUNTER — Ambulatory Visit: Payer: 59 | Admitting: Neurology

## 2018-03-27 VITALS — BP 130/75 | HR 93 | Ht 66.0 in | Wt 175.2 lb

## 2018-03-27 DIAGNOSIS — G243 Spasmodic torticollis: Secondary | ICD-10-CM | POA: Diagnosis not present

## 2018-03-27 NOTE — Progress Notes (Signed)
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  Symptom onset was without apparent triggering event, she has gradually developed this neck pulling,  manifested primarily with right laterocollis, mild retrocolis, and left shoulder elevation since 2000. She has good relief with regular Botox injection, about 4 times a year since 2003. Last injection was February 18, 2010 by Dr. Marta Antu,  She reported great relief as usual, taking 4- 5 days to get symptom relieve, lasting about 3 months.  I began to inject her since 12.2011, every 3-4 months, dosage has been limited to 150 units of BOTOX A.  She denies neck pain, gait difficulty, but with wearing off Botox, she noticed increased head tremor, and pulling towards her right shoulder.  She developed  transient difficulty lifting her neck up when using BOTOX A 200 units previously, reponding well to decreased dose of 150 units  She was enrolled into IPSEN dysport study, first injection was in April 30th 2013, she later enrolled into open label, 2nd injection was in  May 28th 2013. She did very well.  Recent few weeks,  She had experienced flareup of her rheumatoid arthritis, has received IV infusion. Last injection was in 06/10/2012,  500 units of dysport was dissolved into 2 cc of normal saline. Right longissimus capitus 0.5 cc Right splenius capitis 0.5 cc Right levator scapular 0.5 cc Right iliocostalis 0.5 cc   Dysport research study has completed, she is now coming back for repeat injection, she noticed head bobbing over the past few weeks, she denies significant neck pain, weakness,  Her rheumatoid arthritis is under better control   Last injection was in Sep 2014, she did very well, no neck extension weakness, only rarely neck shaking  UPDATE March 8th 2016: She has lost follow-up since her last visit December 2014,  She came in for treatment her cervical dystonia, she feel the tension of her neck all the time, stiffness. Denied gait difficulty, last injection was September 2014, she received 500 units of Dysport, works well for her, she is continue on medication for anxiety, rheumatoid arthritis She also complains of nighttime snoring, frequent awakening, excessive daytime sleepiness, fatigue, today's ESS is 6, FSS was 72,  UPDATE October 13 2014:   She has missed her scheduled sleep study, continue have worsening neck posturing, posterior neck pain, excessive daytime fatigue and sleepiness  UPDATE Nov 11 2014: She came in for EMG guided Dysport injection today, last injection was December 2014, she complains of worsening head titubation, bilateral hands mild postural tremor, posterior neck pain,  Update February 17 2015 She responded very well to last EMG guided Dysport injection Nov 11 2014, no significant head titubation, no significant neck pain  Update July 22 2015: Last injection was August, now she noticed returning of neck pulling and shaking, posterior neck muscle achy pain  Update October 21 2015: Last EMG guided Dysport injection was in January, she responded very well, only recent couple weeks, she noticed returning of neck pulling, shaking again, She has worsening rheumatoid arthritis, is receiving more frequent treatment, complains fatigue, diffuse body achy pain  Update March 15 2016: She responded very well to previous Dysport injection in April, no significant side effect noticed, only recent few weeks, 5 months from previous injection, she noticed return of head shaking neck muscle tightness.  UPDATE Dec 14th 2017: She responded very well  to previous injection in Sept 2017, no significant side effect noticed.  Update September 13 2016: She did well this last injection in December  Update January 15 2017: She noticed worsening head tremor, she had excessive stress, her sister passed away  from lung cancer in April 2018,  UPDATE Apr 25 2017: She responded very well with previous injection, barely has noticeable head shaking.  Update August 01, 2017: She responded very well to previous dysport injection, only recently began to have recurrent head shaking  UPDATE December 19 2017: She responded well to previous injection  UPDATE Sept 25 2019: She responded very well to previous injection  REVIEW OF SYSTEMS: Full 14 system review of systems performed and notable only for ear pain, joint pain, joint swelling, achy muscles, muscle cramps, rash, tremors, depression, anxiety  ALLERGIES: No Known Allergies  HOME MEDICATIONS: Current Outpatient Medications  Medication Sig Dispense Refill  . acetaminophen (TYLENOL) 325 MG tablet Take 650 mg by mouth once. One hour prior to infusion    . baclofen (LIORESAL) 10 MG tablet Take 10 mg by mouth daily as needed for muscle spasms.     Marland Kitchen BIOTIN PO Take by mouth daily.    . clonazePAM (KLONOPIN) 0.5 MG tablet TAKE 1 TABLET BY MOUTH TWICE DAILY IF NEEDED FOR ANXIETY 60 tablet 5  . diphenhydrAMINE (BENADRYL) 25 mg capsule Take 25 mg by mouth once. One hour prior to infusion    . DULoxetine (CYMBALTA) 60 MG capsule TAKE 1 CAPSULE(60 MG) BY MOUTH DAILY 30 capsule 0  . DYSPORT 500 units SOLR injection INJECT 500 UNITS INTRAMUSCULARLY EVERY 3 MONTHS (GIVEN AT PRESCRIBERS OFFICE, DISCARD UNUSED AFTER 1ST USE) 1 each 3  . folic acid (FOLVITE) 1 MG tablet Take 1 tablet (1 mg total) by mouth daily. 90 tablet 4  . inFLIXimab (REMICADE) 100 MG injection Inject 6 mg/kg into the vein every 6 (six) weeks. Infuse 500 mg/ 5 vials in 250mL NS over 2hours every 6 weeks    . methotrexate 50 MG/2ML injection INJECT 0.3 MILLILITERS INTO SKIN ONCE A WEEK 4 mL 0  . Multiple Vitamins-Minerals (MULTIVITAMINS THER. W/MINERALS) TABS Take 1 tablet by mouth daily.    Marland Kitchen omeprazole (PRILOSEC) 20 MG capsule TAKE 1 CAPSULE BY MOUTH EVERY DAY 90 capsule 0  . traMADol  (ULTRAM) 50 MG tablet Take 1 - 2 every 8 hours as needed for pain. 30 tablet 0  . TUBERCULIN SYR 1CC/27GX1/2" (B-D TB SYRINGE 1CC/27GX1/2") 27G X 1/2" 1 ML MISC use 1 TB syringe once weekly 4 each 11  . TURMERIC PO Take by mouth daily.    . Xylitol (XYLIMELTS MT) Use as directed in the mouth or throat.     No current facility-administered medications for this visit.     PAST MEDICAL HISTORY: Past Medical History:  Diagnosis Date  . Anxiety   . Cervical dystonia   . Depression   . Fibromyalgia   . GERD (gastroesophageal reflux disease)   . History of shingles   . RA (rheumatoid arthritis) ([place])   . Synovitis of hand     PAST SURGICAL HISTORY: Past Surgical History:  Procedure Laterality Date  . TONSILLECTOMY    . VAGINAL HYSTERECTOMY  3/08    FAMILY HISTORY: Family History  Problem Relation Age of Onset  . Arthritis Mother   . Heart Problems Father   . Heart disease Father   . Heart disease Paternal Uncle        MI    SOCIAL  HISTORY:  Social History   Socioeconomic History  . Marital status: Married    Spouse name: [name]  . Number of children: 2  . Years of education: 66  . Highest education level: Not on file  Occupational History    Employer: [employer]  . Financial resource strain: Not on file  . Food insecurity:    Worry: Not on file    Inability: Not on file  . Transportation needs:    Medical: Not on file    Non-medical: Not on file  Tobacco Use  . Smoking status: Former Smoker    Packs/day: 0.50    Years: 30.00    Pack years: 15.00    Types: Cigarettes    Last attempt to quit: 05/02/2015    Years since quitting: 2.9  . Smokeless tobacco: Never Used  Substance and Sexual Activity  . Alcohol use: Yes    Alcohol/week: 0.0 standard drinks    Comment: Rare  . Drug use: Never  . Sexual activity: Yes    Birth control/protection: Surgical    Comment: 1st intercourse 57 yo-Fewer than 5 partners  Lifestyle  . Physical  activity:    Days per week: Not on file    Minutes per session: Not on file  . Stress: Not on file  Relationships  . Social connections:    Talks on phone: Not on file    Gets together: Not on file    Attends religious service: Not on file    Active member of club or organization: Not on file    Attends meetings of clubs or organizations: Not on file    Relationship status: Not on file  . Intimate partner violence:    Fear of current or ex partner: Not on file    Emotionally abused: Not on file    Physically abused: Not on file    Forced sexual activity: Not on file  Other Topics Concern  . 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 Right handed.    Caffeine- 4 cups daily.     PHYSICAL EXAM   Vitals:   03/27/18 1451  BP: 130/75  Pulse: 93  Weight: 175 lb 4 oz (79.5 kg)  Height: 5\' 6"  (1.676 m)    Not recorded      Body mass index is 28.29 kg/m.  PHYSICAL EXAMNIATION: MENTAL STATUS: Speech is normal, normal to casual conversation, and history taking  CRANIAL NERVES: mild retrocollis, right tilt, mild right lateral shift, slight left shoulder elevation  MOTOR: Normal tone and bulk and strength COORDINATION: No dysmetria   GAIT/STANCE: Posture is normal.  DIAGNOSTIC DATA (LABS, IMAGING, TESTING) - I reviewed patient records, labs, notes, testing and imaging myself where available.  ASSESSMENT AND PLAN  Courtney Myers is a 57 y.o. female with long-standing history of cervical dystonia, responded very well to previous EMG guided Dysport injection, last injection was May 2016, responded very well.   Mild retrocollis, right tilt, mild right lateral shift, slight left shoulder elevation  500 of Dysport was dissolved into 2.5 cc of normal saline,  Right longissimus capitus 0. 5 cc Left splenius capitis 0.5 cc Right inferior oblique capitis,  0.5 cc  Left inferior oblique capitis 0.5 cc Left levator scapular 0.5   Patient tolerate the injection well, will return to clinic in 3 months for repeat injection Please dissolve  500 units of Dysport into 2.5 cc of normal saline   Marcial Pacas, M.D. Ph.D.  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  Symptom onset was without apparent triggering event, she has gradually developed this neck pulling,  manifested primarily with right laterocollis, mild retrocolis, and left shoulder elevation since 2000. She has good relief with regular Botox injection, about 4 times a year since 2003. Last injection was February 18, 2010 by Dr. Marta Antu,  She reported great relief as usual, taking 4- 5 days to get symptom relieve, lasting about 3 months.  I began to inject her since 12.2011, every 3-4 months, dosage has been limited to 150 units of BOTOX A.  She denies neck pain, gait difficulty, but with wearing off Botox, she noticed increased head tremor, and pulling towards her right shoulder.  She developed  transient difficulty lifting her neck up when using BOTOX A 200 units previously, reponding well to decreased dose of 150 units  She was enrolled into IPSEN dysport study, first injection was in April 30th 2013, she later enrolled into open label, 2nd injection was in  May 28th 2013. She did very well.  Recent few weeks,  She had experienced flareup of her rheumatoid arthritis, has received IV infusion. Last injection was in 06/10/2012,  500 units of dysport was dissolved into 2 cc of normal saline. Right longissimus capitus 0.5 cc Right splenius capitis 0.5 cc Right levator scapular 0.5 cc Right iliocostalis 0.5 cc   Dysport research study has  completed, she is now coming back for repeat injection, she noticed head bobbing over the past few weeks, she denies significant neck pain, weakness,  Her rheumatoid arthritis is under better control   Last injection was in Sep 2014, she did very well, no neck extension weakness, only rarely neck shaking  UPDATE March 8th 2016: She has lost follow-up since her last visit December 2014, She came in for treatment her cervical dystonia, she feel the tension of her neck all the time, stiffness. Denied gait difficulty, last injection was September 2014, she received 500 units of Dysport, works well for her, she is continue on medication for anxiety, rheumatoid arthritis She also complains of nighttime snoring, frequent awakening, excessive daytime sleepiness, fatigue, today's ESS is 6, FSS was 86,  UPDATE October 13 2014:   She has missed her scheduled sleep study, continue have worsening neck posturing, posterior neck pain, excessive daytime fatigue and sleepiness  UPDATE Nov 11 2014: She came in for EMG guided Dysport injection today, last injection was December 2014, she complains of worsening head titubation, bilateral hands mild postural tremor, posterior neck pain,  Update February 17 2015 She responded very well to last EMG guided Dysport injection Nov 11 2014, no significant head titubation, no significant neck pain  Update July 22 2015: Last injection was August, now she noticed returning of neck pulling and shaking, posterior neck muscle achy pain  Update October 21 2015: Last EMG guided Dysport injection was in January, she responded very well, only recent couple weeks, she noticed returning of neck pulling, shaking again, She has worsening rheumatoid arthritis, is receiving more frequent treatment, complains fatigue, diffuse body achy pain  Update March 15 2016: She responded very well to previous  Dysport injection in April, no significant side effect noticed, only recent few  weeks, 5 months from previous injection, she noticed return of head shaking neck muscle tightness.  UPDATE Dec 14th 2017: She responded very well to previous injection in Sept 2017, no significant side effect noticed.  Update September 13 2016: She did well this last injection in December  Update January 15 2017: She noticed worsening head tremor, she had excessive stress, her sister passed away from lung cancer in April 2018,  UPDATE Apr 25 2017: She responded very well with previous injection, barely has noticeable head shaking.  Update August 01, 2017: She responded very well to previous dysport injection, only recently began to have recurrent head shaking  UPDATE December 19 2017: She responded well to previous injection  REVIEW OF SYSTEMS: Full 14 system review of systems performed and notable only for ear pain, joint pain, joint swelling, achy muscles, muscle cramps, rash, tremors, depression, anxiety  ALLERGIES: No Known Allergies  HOME MEDICATIONS: Current Outpatient Medications  Medication Sig Dispense Refill  . acetaminophen (TYLENOL) 325 MG tablet Take 650 mg by mouth once. One hour prior to infusion    . baclofen (LIORESAL) 10 MG tablet Take 10 mg by mouth daily as needed for muscle spasms.     Marland Kitchen BIOTIN PO Take by mouth daily.    . clonazePAM (KLONOPIN) 0.5 MG tablet TAKE 1 TABLET BY MOUTH TWICE DAILY IF NEEDED FOR ANXIETY 60 tablet 5  . diphenhydrAMINE (BENADRYL) 25 mg capsule Take 25 mg by mouth once. One hour prior to infusion    . DULoxetine (CYMBALTA) 60 MG capsule TAKE 1 CAPSULE(60 MG) BY MOUTH DAILY 30 capsule 0  . DYSPORT 500 units SOLR injection INJECT 500 UNITS INTRAMUSCULARLY EVERY 3 MONTHS (GIVEN AT PRESCRIBERS OFFICE, DISCARD UNUSED AFTER 1ST USE) 1 each 3  . folic acid (FOLVITE) 1 MG tablet Take 1 tablet (1 mg total) by mouth daily. 90 tablet 4  . inFLIXimab (REMICADE) 100 MG injection Inject 6 mg/kg into the vein every 6 (six) weeks. Infuse 500 mg/ 5 vials in  277mL NS over 2hours every 6 weeks    . methotrexate 50 MG/2ML injection INJECT 0.3 MILLILITERS INTO SKIN ONCE A WEEK 4 mL 0  . Multiple Vitamins-Minerals (MULTIVITAMINS THER. W/MINERALS) TABS Take 1 tablet by mouth daily.    Marland Kitchen omeprazole (PRILOSEC) 20 MG capsule TAKE 1 CAPSULE BY MOUTH EVERY DAY 90 capsule 0  . traMADol (ULTRAM) 50 MG tablet Take 1 - 2 every 8 hours as needed for pain. 30 tablet 0  . TUBERCULIN SYR 1CC/27GX1/2" (B-D TB SYRINGE 1CC/27GX1/2") 27G X 1/2" 1 ML MISC use 1 TB syringe once weekly 4 each 11  . TURMERIC PO Take by mouth daily.    . Xylitol (XYLIMELTS MT) Use as directed in the mouth or throat.     No current facility-administered medications for this visit.     PAST MEDICAL HISTORY: Past Medical History:  Diagnosis Date  . Anxiety   . Cervical dystonia   . Depression   . Fibromyalgia   . GERD (gastroesophageal reflux disease)   . History of shingles   . RA (rheumatoid arthritis) ([place])   . Synovitis of hand     PAST SURGICAL HISTORY: Past Surgical History:  Procedure Laterality Date  . TONSILLECTOMY    . VAGINAL HYSTERECTOMY  3/08    FAMILY HISTORY: Family History  Problem Relation Age of Onset  . Arthritis Mother   . Heart Problems Father   .  Heart disease Father   . Heart disease Paternal Uncle        MI    SOCIAL HISTORY:  Social History   Socioeconomic History  . Marital status: Married    Spouse name: [name]  . Number of children: 2  . Years of education: 70  . Highest education level: Not on file  Occupational History    Employer: [employer]  . Financial resource strain: Not on file  . Food insecurity:    Worry: Not on file    Inability: Not on file  . Transportation needs:    Medical: Not on file    Non-medical: Not on file  Tobacco Use  . Smoking status: Former Smoker    Packs/day: 0.50    Years: 30.00    Pack years: 15.00    Types: Cigarettes    Last attempt to quit: 05/02/2015    Years since  quitting: 2.9  . Smokeless tobacco: Never Used  Substance and Sexual Activity  . Alcohol use: Yes    Alcohol/week: 0.0 standard drinks    Comment: Rare  . Drug use: Never  . Sexual activity: Yes    Birth control/protection: Surgical    Comment: 1st intercourse 57 yo-Fewer than 5 partners  Lifestyle  . Physical activity:    Days per week: Not on file    Minutes per session: Not on file  . Stress: Not on file  Relationships  . Social connections:    Talks on phone: Not on file    Gets together: Not on file    Attends religious service: Not on file    Active member of club or organization: Not on file    Attends meetings of clubs or organizations: Not on file    Relationship status: Not on file  . Intimate partner violence:    Fear of current or ex partner: Not on file    Emotionally abused: Not on file    Physically abused: Not on file    Forced sexual activity: Not on file  Other Topics Concern  . 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 Right handed.    Caffeine- 4 cups daily.     PHYSICAL EXAM   Vitals:   03/27/18 1451  BP: 130/75  Pulse: 93  Weight: 175 lb 4 oz (79.5 kg)  Height: 5\' 6"  (1.676 m)    Not recorded      Body mass index is 28.29 kg/m.  PHYSICAL EXAMNIATION: MENTAL STATUS: Speech is normal, normal to casual conversation, and history taking  CRANIAL NERVES: mild retrocollis, right tilt, mild right lateral shift, slight left shoulder elevation  MOTOR: Normal tone and bulk and strength   COORDINATION: No dysmetria   GAIT/STANCE: Posture is normal.  DIAGNOSTIC DATA (LABS, IMAGING, TESTING) - I reviewed patient records, labs, notes, testing and imaging myself where available.  ASSESSMENT AND PLAN  Courtney Myers is a 57 y.o. female with long-standing history of cervical dystonia, responded very well to previous EMG  guided Dysport injection, last injection was May 2016, responded very well.   Mild retrocollis, right tilt, mild right lateral shift, slight left shoulder elevation  500 of Dysport was dissolved into 2.5 cc of normal saline,  Right longissimus capitus 0. 5 cc Left splenius capitis 0.5 cc Right inferior oblique capitis, 0.5 cc  Left inferior oblique capitis 0.5  cc Left levator scapular 0.5   Patient tolerate the injection well, will return to clinic in 3 months for repeat injection Please dissolve 500 units of Dysport into 2.5 cc of normal saline   Marcial Pacas, M.D. Ph.D.  [facility] [address], [place], [place] [address] Ph: [phone] Fax: [phone]

## 2018-03-27 NOTE — Progress Notes (Signed)
**  Dysport 500 units x 1 vial, NDC [address]-1, Lot [id number], Exp 12/01/2018, specialty pharmacy.//mck,rn** 

## 2018-03-28 ENCOUNTER — Ambulatory Visit (HOSPITAL_COMMUNITY)
Admission: RE | Admit: 2018-03-28 | Discharge: 2018-03-28 | Disposition: A | Discharge status: Home / Self Care | Payer: 59 | Source: Ambulatory Visit | Attending: Rheumatology | Admitting: Rheumatology

## 2018-03-28 DIAGNOSIS — M0609 Rheumatoid arthritis without rheumatoid factor, multiple sites: Secondary | ICD-10-CM | POA: Insufficient documentation

## 2018-03-28 MED ORDER — ACETAMINOPHEN 325 MG PO TABS: 650.0000 mg | ORAL_TABLET | ORAL | Status: DC

## 2018-03-28 MED ORDER — DIPHENHYDRAMINE HCL 25 MG PO CAPS: 25.0000 mg | ORAL_CAPSULE | ORAL | Status: DC

## 2018-03-28 MED ORDER — SODIUM CHLORIDE 0.9 % IV SOLN
6.0000 mg/kg | INTRAVENOUS | Status: DC
  Administered 2018-03-28: 500 mg via INTRAVENOUS
  Filled 2018-03-28: qty 50

## 2018-04-13 ENCOUNTER — Other Ambulatory Visit: Payer: Self-pay | Admitting: Rheumatology

## 2018-04-15 NOTE — Telephone Encounter (Signed)
Last visit: 03/06/18 Next visit: 08/15/18  Okay to refill per Dr. Deveshwar 

## 2018-04-22 ENCOUNTER — Other Ambulatory Visit: Payer: Self-pay | Admitting: *Deleted

## 2018-04-22 DIAGNOSIS — M0609 Rheumatoid arthritis without rheumatoid factor, multiple sites: Secondary | ICD-10-CM

## 2018-05-09 ENCOUNTER — Encounter (HOSPITAL_COMMUNITY): Payer: 59

## 2018-05-13 ENCOUNTER — Ambulatory Visit (HOSPITAL_COMMUNITY)
Admission: RE | Admit: 2018-05-13 | Discharge: 2018-05-13 | Disposition: A | Discharge status: Home / Self Care | Payer: 59 | Source: Ambulatory Visit | Attending: Rheumatology | Admitting: Rheumatology

## 2018-05-13 DIAGNOSIS — M0609 Rheumatoid arthritis without rheumatoid factor, multiple sites: Secondary | ICD-10-CM

## 2018-05-13 LAB — COMPREHENSIVE METABOLIC PANEL
ALT: 26 U/L (ref 0–44)
AST: 29 U/L (ref 15–41)
Albumin: 3.5 g/dL (ref 3.5–5.0)
Alkaline Phosphatase: 75 U/L (ref 38–126)
Anion gap: 6 (ref 5–15)
BUN: 11 mg/dL (ref 6–20)
CO2: 24 mmol/L (ref 22–32)
Calcium: 8.9 mg/dL (ref 8.9–10.3)
Chloride: 106 mmol/L (ref 98–111)
Creatinine, Ser: 0.81 mg/dL (ref 0.44–1.00)
GFR calc Af Amer: >60 mL/min (ref >60)
GFR calc non Af Amer: >60 mL/min (ref >60)
Glucose, Bld: 128 mg/dL — ABNORMAL HIGH (ref 70–99)
Potassium: 3.8 mmol/L (ref 3.5–5.1)
Sodium: 136 mmol/L (ref 135–145)
Total Bilirubin: 0.7 mg/dL (ref 0.3–1.2)
Total Protein: 6.5 g/dL (ref 6.5–8.1)

## 2018-05-13 LAB — CBC
HCT: 39.7 % (ref 36.0–46.0)
Hemoglobin: 12.9 g/dL (ref 12.0–15.0)
MCH: 29.9 pg (ref 26.0–34.0)
MCHC: 32.5 g/dL (ref 30.0–36.0)
MCV: 91.9 fL (ref 80.0–100.0)
Platelets: 226 10*3/uL (ref 150–400)
RBC: 4.32 MIL/uL (ref 3.87–5.11)
RDW: 14.7 % (ref 11.5–15.5)
WBC: 7.1 10*3/uL (ref 4.0–10.5)
nRBC: 0 % (ref 0.0–0.2)

## 2018-05-13 MED ORDER — DIPHENHYDRAMINE HCL 25 MG PO CAPS: 25.0000 mg | ORAL_CAPSULE | ORAL | Status: DC

## 2018-05-13 MED ORDER — SODIUM CHLORIDE 0.9 % IV SOLN
6.0000 mg/kg | INTRAVENOUS | Status: DC
  Administered 2018-05-13: 500 mg via INTRAVENOUS
  Filled 2018-05-13: qty 50

## 2018-05-13 MED ORDER — ACETAMINOPHEN 325 MG PO TABS: 650.0000 mg | ORAL_TABLET | ORAL | Status: DC

## 2018-05-16 ENCOUNTER — Telehealth: Payer: Self-pay | Admitting: Neurology

## 2018-05-16 ENCOUNTER — Other Ambulatory Visit: Payer: Self-pay | Admitting: Rheumatology

## 2018-05-16 NOTE — Telephone Encounter (Signed)
Botox letter regarding [facility] Pharm[facility] mailed to patient °

## 2018-05-16 NOTE — Telephone Encounter (Signed)
Last visit: 03/06/18 Next visit: 08/15/18  Okay to refill per Dr. Estanislado Pandy

## 2018-05-25 DIAGNOSIS — Z23 Encounter for immunization: Secondary | ICD-10-CM | POA: Diagnosis not present

## 2018-06-11 ENCOUNTER — Other Ambulatory Visit: Payer: Self-pay | Admitting: Rheumatology

## 2018-06-11 ENCOUNTER — Ambulatory Visit: Payer: 59 | Admitting: Family Medicine

## 2018-06-11 ENCOUNTER — Encounter: Payer: Self-pay | Admitting: Family Medicine

## 2018-06-11 ENCOUNTER — Telehealth: Payer: Self-pay

## 2018-06-11 VITALS — BP 110/70 | HR 86 | Temp 98.3°F | Ht 65.25 in | Wt 178.2 lb

## 2018-06-11 DIAGNOSIS — J069 Acute upper respiratory infection, unspecified: Secondary | ICD-10-CM

## 2018-06-11 DIAGNOSIS — Z131 Encounter for screening for diabetes mellitus: Secondary | ICD-10-CM

## 2018-06-11 DIAGNOSIS — H938X2 Other specified disorders of left ear: Secondary | ICD-10-CM

## 2018-06-11 MED ORDER — CETIRIZINE HCL 10 MG PO TBDP: 10.0000 mg | ORAL_TABLET | Freq: Every day | ORAL | 0 refills | Status: DC

## 2018-06-11 NOTE — Telephone Encounter (Signed)
[facility] rx provider notification signed by Dr. Krista Blue.Fax to [phone]. Fax confirmed and receive.

## 2018-06-11 NOTE — Telephone Encounter (Signed)
Last visit: 03/06/18 Next visit: 08/15/18 Labs: 05/13/18 Glucose is 128. Rest of CMP WNL. CBC WNL  Okay to refill per Dr. Estanislado Pandy

## 2018-06-11 NOTE — Progress Notes (Signed)
Patient ID: Courtney Myers, female    DOB: January 30, 1961, 57 y.o.   MRN: [id number]  PCP: Orlena Sheldon, PA-C  Chief Complaint  Patient presents with  . Ear Pain    Patient has c/o fatigue, muscle aches, ear fullness, and feverish.    Subjective:   Courtney Myers is a 57 y.o. female, presents to clinic with CC of left ear fullness for 6 months, worsened last week.  She also reports 3 days of sore scratchy throat, mild nasal congestion, subjective fever with hot and cold chills.  She has felt mildly ill and since her ear worsened she came to be evaluated.  She does not have any headaches, neck pain, difficulty swallowing, severe sinus pain or pressure, cough, wheeze or shortness of breath.  She does have seasonal allergies but is not currently treating them, she denies any sick contacts.  Her left ear she denies any pain, swelling or redness, drainage or decreased hearing.  She does clean them out with twisted up toilet paper, she does feel like it is blocked or full, she denies any history of recurrent ear infections or sinus infections, she occasionally gets bronchitis  Patient Active Problem List   Diagnosis Date Noted  . Excessive sleepiness 09/08/2014  . Fibromyalgia syndrome 08/25/2014  . RA (rheumatoid arthritis) ([place])   . GERD (gastroesophageal reflux disease)   . Anxiety   . Depression   . History of shingles   . Synovitis of hand   . Cervical dystonia 12/04/2012     Prior to Admission medications   Medication Sig Start Date End Date Taking? Authorizing Provider  acetaminophen (TYLENOL) 325 MG tablet Take 650 mg by mouth once. One hour prior to infusion   Yes [provider]  baclofen (LIORESAL) 10 MG tablet Take 10 mg by mouth daily as needed for muscle spasms.  05/01/13  Yes [provider]  BIOTIN PO Take by mouth daily.   Yes [provider]  clonazePAM (KLONOPIN) 0.5 MG tablet TAKE 1 TABLET BY MOUTH TWICE DAILY IF NEEDED FOR ANXIETY 03/14/18   Yes Marcial Pacas, MD  diphenhydrAMINE (BENADRYL) 25 mg capsule Take 25 mg by mouth once. One hour prior to infusion   Yes [provider]  DULoxetine (CYMBALTA) 60 MG capsule TAKE 1 CAPSULE(60 MG) BY MOUTH DAILY 05/16/18  Yes Deveshwar, Abel Presto, MD  DYSPORT 500 units SOLR injection INJECT 500 UNITS INTRAMUSCULARLY EVERY 3 MONTHS (GIVEN AT PRESCRIBERS OFFICE, DISCARD UNUSED AFTER 1ST USE) 01/16/18  Yes Marcial Pacas, MD  folic acid (FOLVITE) 1 MG tablet Take 1 tablet (1 mg total) by mouth daily. 08/14/17 11/07/18 Yes Deveshwar, Abel Presto, MD  inFLIXimab (REMICADE) 100 MG injection Inject 6 mg/kg into the vein every 6 (six) weeks. Infuse 500 mg/ 5 vials in 221mL NS over 2hours every 6 weeks   Yes Deveshwar, Shaili, MD  methotrexate 50 MG/2ML injection INJECT 0.3 ML INTO THE SKIN ONCE A WEEK 06/11/18  Yes Deveshwar, Abel Presto, MD  Multiple Vitamins-Minerals (MULTIVITAMINS THER. W/MINERALS) TABS Take 1 tablet by mouth daily.   Yes [provider]  omeprazole (PRILOSEC) 20 MG capsule TAKE 1 CAPSULE BY MOUTH EVERY DAY 03/14/18  Yes Dena Billet B, PA-C  traMADol (ULTRAM) 50 MG tablet Take 1 - 2 every 8 hours as needed for pain. 10/22/17  Yes Orlena Sheldon, PA-C  TUBERCULIN SYR 1CC/27GX1/2" (B-D TB SYRINGE 1CC/27GX1/2") 27G X 1/2" 1 ML MISC use 1 TB syringe once weekly 11/29/17  Yes Deveshwar, Abel Presto,  MD  TURMERIC PO Take by mouth daily.   Yes [provider]  Xylitol (XYLIMELTS MT) Use as directed in the mouth or throat.   Yes [provider]     No Known Allergies   Family History  Problem Relation Age of Onset  . Arthritis Mother   . Heart Problems Father   . Heart disease Father   . Heart disease Paternal Uncle        MI     Social History   Socioeconomic History  . Marital status: Married    Spouse name: [name]  . Number of children: 2  . Years of education: 15  . Highest education level: Not on file  Occupational History    Employer: [employer]    . Financial resource strain: Not on file  . Food insecurity:    Worry: Not on file    Inability: Not on file  . Transportation needs:    Medical: Not on file    Non-medical: Not on file  Tobacco Use  . Smoking status: Former Smoker    Packs/day: 0.50    Years: 30.00    Pack years: 15.00    Types: Cigarettes    Last attempt to quit: 05/02/2015    Years since quitting: 3.1  . Smokeless tobacco: Never Used  Substance and Sexual Activity  . Alcohol use: Yes    Alcohol/week: 0.0 standard drinks    Comment: Rare  . Drug use: Never  . Sexual activity: Yes    Birth control/protection: Surgical    Comment: 1st intercourse 57 yo-Fewer than 5 partners  Lifestyle  . Physical activity:    Days per week: Not on file    Minutes per session: Not on file  . Stress: Not on file  Relationships  . Social connections:    Talks on phone: Not on file    Gets together: Not on file    Attends religious service: Not on file    Active member of club or organization: Not on file    Attends meetings of clubs or organizations: Not on file    Relationship status: Not on file  . Intimate partner violence:    Fear of current or ex partner: Not on file    Emotionally abused: Not on file    Physically abused: Not on file    Forced sexual activity: Not on file  Other Topics Concern  . 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 Right handed.    Caffeine- 4 cups daily.     Review of Systems  Constitutional: Negative.  Negative for activity change, appetite change, diaphoresis, fatigue and unexpected weight change.  HENT: Negative.   Eyes: Negative.   Respiratory: Negative.  Negative for shortness of breath and wheezing.   Cardiovascular: Negative.  Negative for chest pain.  Gastrointestinal: Negative.  Negative for abdominal pain, diarrhea, nausea and vomiting.  Endocrine:  Negative.   Genitourinary: Negative.   Musculoskeletal: Negative.   Skin: Negative.   Allergic/Immunologic: Negative.   Neurological: Negative.  Negative for dizziness, weakness, light-headedness and headaches.  Hematological: Negative.   Psychiatric/Behavioral: Negative.   All other systems reviewed and are negative.      Objective:    Vitals:   06/11/18 0923  BP: 110/70  Pulse: 86  Temp: 98.3 F (36.8 C)  TempSrc: Oral  SpO2: 98%  Weight: 178 lb 4 oz (80.9 kg)  Height: 5' 5.25" (1.657 m)      Physical Exam  Constitutional: She appears well-developed and well-nourished.  Non-toxic appearance. She does not have a sickly appearance. She does not appear ill. No distress.  HENT:  Head: Normocephalic and atraumatic.  Right Ear: Hearing, external ear and ear canal normal. No drainage, swelling or tenderness. No foreign bodies. No mastoid tenderness. Tympanic membrane is not injected. A middle ear effusion is present. No decreased hearing is noted.  Left Ear: Hearing, external ear and ear canal normal. No drainage, swelling or tenderness. No foreign bodies. No mastoid tenderness. Tympanic membrane is not injected. A middle ear effusion is present. No decreased hearing is noted.  Nose: Mucosal edema (and erythema) and rhinorrhea (moderate clear nasal discharge) present. Right sinus exhibits no maxillary sinus tenderness and no frontal sinus tenderness. Left sinus exhibits no maxillary sinus tenderness and no frontal sinus tenderness.  Mouth/Throat: Uvula is midline and mucous membranes are normal. Mucous membranes are not pale. No uvula swelling. Posterior oropharyngeal erythema (mild diffuse erythema and injection) present. No oropharyngeal exudate, posterior oropharyngeal edema or tonsillar abscesses.  Bilateral tympanic membrane with dullness to inferior TM, intact, no erythema, otherwise normal landmarks  Eyes: Pupils are equal, round, and reactive to light. Conjunctivae are normal.  Right eye exhibits no discharge. Left eye exhibits no discharge.  Neck: Normal range of motion. Neck supple. No tracheal deviation present.  Cardiovascular: Normal rate, regular rhythm, normal heart sounds and intact distal pulses. Exam reveals no gallop and no friction rub.  No murmur heard. Pulmonary/Chest: Effort normal. No stridor. No respiratory distress. She has no wheezes. She has no rhonchi. She has no rales.  Abdominal: Soft. Bowel sounds are normal. She exhibits no distension.  Musculoskeletal: Normal range of motion.  Lymphadenopathy:       Head (right side): No preauricular and no posterior auricular adenopathy present.       Head (left side): No preauricular and no posterior auricular adenopathy present.    She has no cervical adenopathy.  Neurological: She is alert. She exhibits normal muscle tone. Coordination normal.  Skin: Skin is warm and dry. No rash noted. She is not diaphoretic. No pallor.  Psychiatric: She has a normal mood and affect. Her behavior is normal.  Nursing note and vitals reviewed.         Assessment & Plan:      ICD-10-CM   1. Upper respiratory tract infection, unspecified type J06.9   2. Ear fullness, left H93.8X2   3. Screening for diabetes mellitus Y19.5 COMPLETE METABOLIC PANEL WITH GFR    Patient with several months of left ear fullness that recently worsened in the past week, she also had URI symptoms and subjective fever with postnasal drip, congestion, sore throat.  Her URI symptoms did cause her left ear to be slightly worse but it has already started to improve.  On exam she has bilateral dullness to the inferior aspect of the tympanic membranes and possible serous effusion there is no bulging or erythema no purulence visualized on exam with the ear.  I suspect that she has had some chronic eustachian tube dysfunction recently exacerbated by URI.  Tx sinuses with allergy medications, Flonase, Zyrtec.  Suspect she had a viral illness, supportive  and symptomatic treatment.  Patient feels like she is getting better.  She does get bronchitis occasionally she does not feel that she has had a going to her chest at all no  coughing wheeze or shortness of breath.  We will follow-up if sinuses do not improve or if she develops chest cold.  Patient also mentions that her rheumatologist told her with her blood sugar was extremely elevated with her last lab work with them.  She states that she was not fasting when the labs were done her random glucose was 128, she wants to work-up for diabetes, she did have some coffee and sugar this morning.  She has not had any weight loss, urinary frequency urgency, fatigue.  I do not believe we need to add on A1c based on a random sugar of 128 so she will come by and repeat a fasting labs to see if fasting sugars in normal range and will take it from there.  Delsa Grana, PA-C 06/11/18 9:47 AM

## 2018-06-11 NOTE — Patient Instructions (Signed)
Support your body with allergy medicine and steroid nasal spray like flonase or nasonex - they are over the counter, do steroid nasal spray dialy, 2 sprays each nostril - trial of this for 2 weeks and follow up if nose or ears are getting worse or not improving.

## 2018-06-12 ENCOUNTER — Other Ambulatory Visit: Payer: Self-pay | Admitting: Rheumatology

## 2018-06-12 ENCOUNTER — Other Ambulatory Visit: Payer: Self-pay | Admitting: *Deleted

## 2018-06-12 DIAGNOSIS — K219 Gastro-esophageal reflux disease without esophagitis: Secondary | ICD-10-CM

## 2018-06-12 MED ORDER — OMEPRAZOLE 20 MG PO CPDR: DELAYED_RELEASE_CAPSULE | ORAL | 3 refills | Status: DC

## 2018-06-12 NOTE — Telephone Encounter (Signed)
Last visit: 03/06/18 Next visit: 08/15/18  Okay to refill per Dr. Estanislado Pandy

## 2018-06-18 DIAGNOSIS — G243 Spasmodic torticollis: Secondary | ICD-10-CM | POA: Diagnosis not present

## 2018-06-20 ENCOUNTER — Encounter: Payer: Self-pay | Admitting: Neurology

## 2018-06-20 ENCOUNTER — Ambulatory Visit: Payer: 59 | Admitting: Neurology

## 2018-06-20 VITALS — BP 117/71 | HR 102 | Ht 65.25 in | Wt 178.2 lb

## 2018-06-20 DIAGNOSIS — G243 Spasmodic torticollis: Secondary | ICD-10-CM

## 2018-06-20 NOTE — Progress Notes (Signed)
**  Dysport 500 units x 1 vial, NDC [address]-3, Lot [id number], Exp 04/02/2019, specialty pharmacy.//mck,rn**

## 2018-06-20 NOTE — Progress Notes (Signed)
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  Symptom onset was without apparent triggering event, she has gradually developed this neck pulling,  manifested primarily with right laterocollis, mild retrocolis, and left shoulder elevation since 2000. She has good relief with regular Botox injection, about 4 times a year since 2003. Last injection was February 18, 2010 by Dr. Marta Antu,  She reported great relief as usual, taking 4- 5 days to get symptom relieve, lasting about 3 months.  I began to inject her since 12.2011, every 3-4 months, dosage has been limited to 150 units of BOTOX A.  She denies neck pain, gait difficulty, but with wearing off Botox, she noticed increased head tremor, and pulling towards her right shoulder.  She developed  transient difficulty lifting her neck up when using BOTOX A 200 units previously, reponding well to decreased dose of 150 units  She was enrolled into IPSEN dysport study, first injection was in April 30th 2013, she later enrolled into open label, 2nd injection was in  May 28th 2013. She did very well.  Recent few weeks,  She had experienced flareup of her rheumatoid arthritis, has received IV infusion. Last injection was in 06/10/2012,  500 units of dysport was dissolved into 2 cc of normal saline. Right longissimus capitus 0.5 cc Right splenius capitis 0.5 cc Right levator scapular 0.5 cc Right iliocostalis 0.5 cc   Dysport research study has completed, she is now coming back for repeat injection, she noticed head bobbing over the past few weeks, she denies significant neck pain, weakness,  Her rheumatoid arthritis is under better control   Last injection was in Sep 2014, she did very well, no neck extension weakness, only rarely neck shaking  UPDATE March 8th 2016: She has lost follow-up since her last visit December 2014,  She came in for treatment her cervical dystonia, she feel the tension of her neck all the time, stiffness. Denied gait difficulty, last injection was September 2014, she received 500 units of Dysport, works well for her, she is continue on medication for anxiety, rheumatoid arthritis She also complains of nighttime snoring, frequent awakening, excessive daytime sleepiness, fatigue, today's ESS is 6, FSS was 72,  UPDATE October 13 2014:   She has missed her scheduled sleep study, continue have worsening neck posturing, posterior neck pain, excessive daytime fatigue and sleepiness  UPDATE Nov 11 2014: She came in for EMG guided Dysport injection today, last injection was December 2014, she complains of worsening head titubation, bilateral hands mild postural tremor, posterior neck pain,  Update February 17 2015 She responded very well to last EMG guided Dysport injection Nov 11 2014, no significant head titubation, no significant neck pain  Update July 22 2015: Last injection was August, now she noticed returning of neck pulling and shaking, posterior neck muscle achy pain  Update October 21 2015: Last EMG guided Dysport injection was in January, she responded very well, only recent couple weeks, she noticed returning of neck pulling, shaking again, She has worsening rheumatoid arthritis, is receiving more frequent treatment, complains fatigue, diffuse body achy pain  Update March 15 2016: She responded very well to previous Dysport injection in April, no significant side effect noticed, only recent few weeks, 5 months from previous injection, she noticed return of head shaking neck muscle tightness.  UPDATE Dec 14th 2017: She responded very well  to previous injection in Sept 2017, no significant side effect noticed.  Update September 13 2016: She did well this last injection in December  Update January 15 2017: She noticed worsening head tremor, she had excessive stress, her sister passed away  from lung cancer in April 2018,  UPDATE Apr 25 2017: She responded very well with previous injection, barely has noticeable head shaking.  Update August 01, 2017: She responded very well to previous dysport injection, only recently began to have recurrent head shaking  UPDATE December 19 2017: She responded well to previous injection  UPDATE Sept 25 2019: She responded very well to previous injection  REVIEW OF SYSTEMS: Full 14 system review of systems performed and notable only for ear pain, joint pain, joint swelling, achy muscles, muscle cramps, rash, tremors, depression, anxiety  ALLERGIES: No Known Allergies  HOME MEDICATIONS: Current Outpatient Medications  Medication Sig Dispense Refill  . acetaminophen (TYLENOL) 325 MG tablet Take 650 mg by mouth once. One hour prior to infusion    . baclofen (LIORESAL) 10 MG tablet Take 10 mg by mouth daily as needed for muscle spasms.     Marland Kitchen BIOTIN PO Take by mouth daily.    . Cetirizine HCl (ZYRTEC ALLERGY) 10 MG TBDP Take 10 mg by mouth at bedtime. 30 tablet 0  . clonazePAM (KLONOPIN) 0.5 MG tablet TAKE 1 TABLET BY MOUTH TWICE DAILY IF NEEDED FOR ANXIETY 60 tablet 5  . diphenhydrAMINE (BENADRYL) 25 mg capsule Take 25 mg by mouth once. One hour prior to infusion    . DULoxetine (CYMBALTA) 60 MG capsule TAKE 1 CAPSULE(60 MG) BY MOUTH DAILY 30 capsule 0  . DYSPORT 500 units SOLR injection INJECT 500 UNITS INTRAMUSCULARLY EVERY 3 MONTHS (GIVEN AT PRESCRIBERS OFFICE, DISCARD UNUSED AFTER 1ST USE) 1 each 3  . folic acid (FOLVITE) 1 MG tablet Take 1 tablet (1 mg total) by mouth daily. 90 tablet 4  . inFLIXimab (REMICADE) 100 MG injection Inject 6 mg/kg into the vein every 6 (six) weeks. Infuse 500 mg/ 5 vials in 272mL NS over 2hours every 6 weeks    . methotrexate 50 MG/2ML injection INJECT 0.3 ML INTO THE SKIN ONCE A WEEK 4 mL 0  . Multiple Vitamins-Minerals (MULTIVITAMINS THER. W/MINERALS) TABS Take 1 tablet by mouth daily.    Marland Kitchen omeprazole  (PRILOSEC) 20 MG capsule TAKE 1 CAPSULE BY MOUTH EVERY DAY 90 capsule 3  . traMADol (ULTRAM) 50 MG tablet Take 1 - 2 every 8 hours as needed for pain. 30 tablet 0  . TUBERCULIN SYR 1CC/27GX1/2" (B-D TB SYRINGE 1CC/27GX1/2") 27G X 1/2" 1 ML MISC use 1 TB syringe once weekly 4 each 11  . TURMERIC PO Take by mouth daily.    . Xylitol (XYLIMELTS MT) Use as directed in the mouth or throat.     No current facility-administered medications for this visit.     PAST MEDICAL HISTORY: Past Medical History:  Diagnosis Date  . Anxiety   . Cervical dystonia   . Depression   . Fibromyalgia   . GERD (gastroesophageal reflux disease)   . History of shingles   . RA (rheumatoid arthritis) ([place])   . Synovitis of hand     PAST SURGICAL HISTORY: Past Surgical History:  Procedure Laterality Date  . TONSILLECTOMY    . VAGINAL HYSTERECTOMY  3/08    FAMILY HISTORY: Family History  Problem Relation Age of Onset  . Arthritis Mother   . Heart Problems Father   . Heart disease  Father   . Heart disease Paternal Uncle        MI    SOCIAL HISTORY:  Social History   Socioeconomic History  . Marital status: Married    Spouse name: [name]  . Number of children: 2  . Years of education: 42  . Highest education level: Not on file  Occupational History    Employer: [employer]  . Financial resource strain: Not on file  . Food insecurity:    Worry: Not on file    Inability: Not on file  . Transportation needs:    Medical: Not on file    Non-medical: Not on file  Tobacco Use  . Smoking status: Former Smoker    Packs/day: 0.50    Years: 30.00    Pack years: 15.00    Types: Cigarettes    Last attempt to quit: 05/02/2015    Years since quitting: 3.1  . Smokeless tobacco: Never Used  Substance and Sexual Activity  . Alcohol use: Yes    Alcohol/week: 0.0 standard drinks    Comment: Rare  . Drug use: Never  . Sexual activity: Yes    Birth control/protection: Surgical     Comment: 1st intercourse 57 yo-Fewer than 5 partners  Lifestyle  . Physical activity:    Days per week: Not on file    Minutes per session: Not on file  . Stress: Not on file  Relationships  . Social connections:    Talks on phone: Not on file    Gets together: Not on file    Attends religious service: Not on file    Active member of club or organization: Not on file    Attends meetings of clubs or organizations: Not on file    Relationship status: Not on file  . Intimate partner violence:    Fear of current or ex partner: Not on file    Emotionally abused: Not on file    Physically abused: Not on file    Forced sexual activity: Not on file  Other Topics Concern  . 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 Right handed.    Caffeine- 4 cups daily.     PHYSICAL EXAM   Vitals:   06/20/18 1526  BP: 117/71  Pulse: (!) 102  Weight: 178 lb 4 oz (80.9 kg)  Height: 5' 5.25" (1.657 m)    Not recorded      Body mass index is 29.44 kg/m.  PHYSICAL EXAMNIATION: MENTAL STATUS: Speech is normal, normal to casual conversation, and history taking  CRANIAL NERVES: mild retrocollis, right tilt, mild right lateral shift, slight left shoulder elevation  MOTOR: Normal tone and bulk and strength COORDINATION: No dysmetria   GAIT/STANCE: Posture is normal.  DIAGNOSTIC DATA (LABS, IMAGING, TESTING) - I reviewed patient records, labs, notes, testing and imaging myself where available.  ASSESSMENT AND PLAN  SHONNIE POUDRIER is a 57 y.o. female with long-standing history of cervical dystonia, responded very well to previous EMG guided Dysport injection, last injection was May 2016, responded very well.   Mild retrocollis, right tilt, mild right lateral shift, slight left shoulder elevation  500 of Dysport was dissolved into 2.5 cc of normal saline,  Right  longissimus capitus 0. 5 cc Left splenius capitis 0.5 cc Right inferior oblique capitis, 0.5 cc  Left inferior oblique capitis 0.5 cc Left levator  scapular 0.5   Patient tolerate the injection well, will return to clinic in 3 months for repeat injection Please dissolve 500 units of Dysport into 2.5 cc of normal saline   Marcial Pacas, M.D. Ph.D.  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  Symptom onset was without apparent triggering event, she has gradually developed this neck pulling,  manifested primarily with right laterocollis, mild retrocolis, and left shoulder elevation since 2000. She has good relief with regular Botox injection, about 4 times a year since 2003. Last injection was February 18, 2010 by Dr. Marta Antu,  She reported great relief as usual, taking 4- 5 days to get symptom relieve, lasting about 3 months.  I began to inject her since 12.2011, every 3-4 months, dosage has been limited to 150 units of BOTOX A.  She denies neck pain, gait difficulty, but with wearing off Botox, she noticed increased head tremor, and pulling towards her right shoulder.  She developed  transient difficulty lifting her neck up when using BOTOX A 200 units previously, reponding well to decreased dose of 150 units  She was enrolled into IPSEN dysport study, first injection was in April 30th 2013, she later enrolled into open label, 2nd injection was in  May 28th 2013. She did very well.  Recent few weeks,  She had experienced flareup of her rheumatoid arthritis, has received IV infusion. Last injection was in 06/10/2012,  500 units of dysport was dissolved into 2 cc of normal saline. Right longissimus capitus 0.5 cc Right splenius capitis 0.5 cc  Right levator scapular 0.5 cc Right iliocostalis 0.5 cc   Dysport research study has completed, she is now coming back for repeat injection, she noticed head bobbing over the past few weeks, she denies significant neck pain, weakness,  Her rheumatoid arthritis is under better control   Last injection was in Sep 2014, she did very well, no neck extension weakness, only rarely neck shaking  UPDATE March 8th 2016: She has lost follow-up since her last visit December 2014, She came in for treatment her cervical dystonia, she feel the tension of her neck all the time, stiffness. Denied gait difficulty, last injection was September 2014, she received 500 units of Dysport, works well for her, she is continue on medication for anxiety, rheumatoid arthritis She also complains of nighttime snoring, frequent awakening, excessive daytime sleepiness, fatigue, today's ESS is 6, FSS was 51,  UPDATE October 13 2014:   She has missed her scheduled sleep study, continue have worsening neck posturing, posterior neck pain, excessive daytime fatigue and sleepiness  UPDATE Nov 11 2014: She came in for EMG guided Dysport injection today, last injection was December 2014, she complains of worsening head titubation, bilateral hands mild postural tremor, posterior neck pain,  Update February 17 2015 She responded very well to last EMG guided Dysport injection Nov 11 2014, no significant head titubation, no significant neck pain  Update July 22 2015: Last injection was August, now she noticed returning of neck pulling and shaking, posterior neck muscle achy pain  Update October 21 2015: Last EMG guided Dysport injection was in January, she responded very well, only recent couple weeks, she noticed returning of neck pulling, shaking again, She has worsening rheumatoid arthritis, is  receiving more frequent treatment, complains fatigue, diffuse body achy pain  Update March 15 2016: She responded very well to  previous Dysport injection in April, no significant side effect noticed, only recent few weeks, 5 months from previous injection, she noticed return of head shaking neck muscle tightness.  UPDATE Dec 14th 2017: She responded very well to previous injection in Sept 2017, no significant side effect noticed.  Update September 13 2016: She did well this last injection in December  Update January 15 2017: She noticed worsening head tremor, she had excessive stress, her sister passed away from lung cancer in April 2018,  UPDATE Apr 25 2017: She responded very well with previous injection, barely has noticeable head shaking.  Update August 01, 2017: She responded very well to previous dysport injection, only recently began to have recurrent head shaking  UPDATE December 19 2017: She responded well to previous injection  UPDATE Jun 20 2018: She did very well with previous injection  REVIEW OF SYSTEMS: Full 14 system review of systems performed and notable only for as above.  All rest review of system were negative.  ALLERGIES: No Known Allergies  HOME MEDICATIONS: Current Outpatient Medications  Medication Sig Dispense Refill  . acetaminophen (TYLENOL) 325 MG tablet Take 650 mg by mouth once. One hour prior to infusion    . baclofen (LIORESAL) 10 MG tablet Take 10 mg by mouth daily as needed for muscle spasms.     Marland Kitchen BIOTIN PO Take by mouth daily.    . Cetirizine HCl (ZYRTEC ALLERGY) 10 MG TBDP Take 10 mg by mouth at bedtime. 30 tablet 0  . clonazePAM (KLONOPIN) 0.5 MG tablet TAKE 1 TABLET BY MOUTH TWICE DAILY IF NEEDED FOR ANXIETY 60 tablet 5  . diphenhydrAMINE (BENADRYL) 25 mg capsule Take 25 mg by mouth once. One hour prior to infusion    . DULoxetine (CYMBALTA) 60 MG capsule TAKE 1 CAPSULE(60 MG) BY MOUTH DAILY 30 capsule 0  . DYSPORT 500 units SOLR injection INJECT 500 UNITS INTRAMUSCULARLY EVERY 3 MONTHS (GIVEN AT PRESCRIBERS OFFICE, DISCARD UNUSED AFTER 1ST USE) 1 each 3  . folic acid  (FOLVITE) 1 MG tablet Take 1 tablet (1 mg total) by mouth daily. 90 tablet 4  . inFLIXimab (REMICADE) 100 MG injection Inject 6 mg/kg into the vein every 6 (six) weeks. Infuse 500 mg/ 5 vials in 262mL NS over 2hours every 6 weeks    . methotrexate 50 MG/2ML injection INJECT 0.3 ML INTO THE SKIN ONCE A WEEK 4 mL 0  . Multiple Vitamins-Minerals (MULTIVITAMINS THER. W/MINERALS) TABS Take 1 tablet by mouth daily.    Marland Kitchen omeprazole (PRILOSEC) 20 MG capsule TAKE 1 CAPSULE BY MOUTH EVERY DAY 90 capsule 3  . traMADol (ULTRAM) 50 MG tablet Take 1 - 2 every 8 hours as needed for pain. 30 tablet 0  . TUBERCULIN SYR 1CC/27GX1/2" (B-D TB SYRINGE 1CC/27GX1/2") 27G X 1/2" 1 ML MISC use 1 TB syringe once weekly 4 each 11  . TURMERIC PO Take by mouth daily.    . Xylitol (XYLIMELTS MT) Use as directed in the mouth or throat.     No current facility-administered medications for this visit.     PAST MEDICAL HISTORY: Past Medical History:  Diagnosis Date  . Anxiety   . Cervical dystonia   . Depression   . Fibromyalgia   . GERD (gastroesophageal reflux disease)   . History of shingles   . RA (rheumatoid arthritis) ([place])   . Synovitis of hand  PAST SURGICAL HISTORY: Past Surgical History:  Procedure Laterality Date  . TONSILLECTOMY    . VAGINAL HYSTERECTOMY  3/08    FAMILY HISTORY: Family History  Problem Relation Age of Onset  . Arthritis Mother   . Heart Problems Father   . Heart disease Father   . Heart disease Paternal Uncle        MI    SOCIAL HISTORY:  Social History   Socioeconomic History  . Marital status: Married    Spouse name: [name]  . Number of children: 2  . Years of education: 15  . Highest education level: Not on file  Occupational History    Employer: [employer]  . Financial resource strain: Not on file  . Food insecurity:    Worry: Not on file    Inability: Not on file  . Transportation needs:    Medical: Not on file    Non-medical: Not on  file  Tobacco Use  . Smoking status: Former Smoker    Packs/day: 0.50    Years: 30.00    Pack years: 15.00    Types: Cigarettes    Last attempt to quit: 05/02/2015    Years since quitting: 3.1  . Smokeless tobacco: Never Used  Substance and Sexual Activity  . Alcohol use: Yes    Alcohol/week: 0.0 standard drinks    Comment: Rare  . Drug use: Never  . Sexual activity: Yes    Birth control/protection: Surgical    Comment: 1st intercourse 57 yo-Fewer than 5 partners  Lifestyle  . Physical activity:    Days per week: Not on file    Minutes per session: Not on file  . Stress: Not on file  Relationships  . Social connections:    Talks on phone: Not on file    Gets together: Not on file    Attends religious service: Not on file    Active member of club or organization: Not on file    Attends meetings of clubs or organizations: Not on file    Relationship status: Not on file  . Intimate partner violence:    Fear of current or ex partner: Not on file    Emotionally abused: Not on file    Physically abused: Not on file    Forced sexual activity: Not on file  Other Topics Concern  . 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 Right handed.    Caffeine- 4 cups daily.     PHYSICAL EXAM   Vitals:   06/20/18 1526  BP: 117/71  Pulse: (!) 102  Weight: 178 lb 4 oz (80.9 kg)  Height: 5' 5.25" (1.657 m)    Not recorded      Body mass index is 29.44 kg/m.  PHYSICAL EXAMNIATION: MENTAL STATUS: Speech is normal, normal to casual conversation, and history taking  CRANIAL NERVES: mild retrocollis, right tilt, mild right lateral shift, slight left shoulder elevation  MOTOR: Normal tone and bulk and strength   COORDINATION: No dysmetria   GAIT/STANCE: Posture is normal.  DIAGNOSTIC DATA (LABS, IMAGING, TESTING) - I reviewed patient records, labs,  notes, testing and imaging myself where available.  ASSESSMENT AND PLAN  Courtney Myers is a 57 y.o. female with long-standing history of cervical dystonia, responded very well to previous EMG guided Dysport injection, last injection was May 2016, responded very well.  Mild retrocollis, right tilt, mild right lateral shift, slight left shoulder elevation  500 of Dysport was dissolved into 2.5 cc of normal saline,  Left longissimus capitus 0. 5 cc Left splenius capitis 0.5 cc Right inferior oblique capitis, 0.5 cc  Left inferior oblique capitis 0.5 cc Left levator scapular 0.5   Patient tolerate the injection well, will return to clinic in 3 months for repeat injection Please dissolve 500 units of Dysport into 2.5 cc of normal saline   Marcial Pacas, M.D. Ph.D.  [facility] [address], [place], [place] [address] Ph: [phone] Fax: [phone]

## 2018-06-24 ENCOUNTER — Encounter (HOSPITAL_COMMUNITY): Payer: 59

## 2018-06-27 ENCOUNTER — Ambulatory Visit (HOSPITAL_COMMUNITY)
Admission: RE | Admit: 2018-06-27 | Discharge: 2018-06-27 | Disposition: A | Discharge status: Home / Self Care | Payer: 59 | Source: Ambulatory Visit | Attending: Rheumatology | Admitting: Rheumatology

## 2018-06-27 DIAGNOSIS — M0609 Rheumatoid arthritis without rheumatoid factor, multiple sites: Secondary | ICD-10-CM | POA: Insufficient documentation

## 2018-06-27 MED ORDER — DIPHENHYDRAMINE HCL 25 MG PO CAPS: 25.0000 mg | ORAL_CAPSULE | ORAL | Status: DC

## 2018-06-27 MED ORDER — SODIUM CHLORIDE 0.9 % IV SOLN
6.0000 mg/kg | INTRAVENOUS | Status: DC
  Administered 2018-06-27: 500 mg via INTRAVENOUS
  Filled 2018-06-27: qty 50

## 2018-06-27 MED ORDER — ACETAMINOPHEN 325 MG PO TABS: 650.0000 mg | ORAL_TABLET | ORAL | Status: DC

## 2018-06-28 ENCOUNTER — Telehealth: Payer: Self-pay | Admitting: Pharmacy Technician

## 2018-06-28 NOTE — Telephone Encounter (Signed)
Called [employer] and submitted a new pre-certification for Remicade. 10 visits for the following coverage dates 07/06/18- 07/07/19. Submitted for [id number], [id number], and [id number]. Determination will take up to 15 business days.   Rep-Emily Ref# [id number] Phone# [phone]  9:18 AM Beatriz Chancellor, CPhT

## 2018-07-10 ENCOUNTER — Encounter: Payer: Self-pay | Admitting: Family Medicine

## 2018-07-10 ENCOUNTER — Ambulatory Visit: Payer: 59 | Admitting: Family Medicine

## 2018-07-10 VITALS — BP 114/80 | HR 88 | Temp 97.8°F | Resp 14 | Ht 65.25 in | Wt 175.4 lb

## 2018-07-10 DIAGNOSIS — J329 Chronic sinusitis, unspecified: Secondary | ICD-10-CM

## 2018-07-10 DIAGNOSIS — J310 Chronic rhinitis: Secondary | ICD-10-CM

## 2018-07-10 DIAGNOSIS — H6522 Chronic serous otitis media, left ear: Secondary | ICD-10-CM | POA: Diagnosis not present

## 2018-07-10 NOTE — Patient Instructions (Signed)
Start flonase and zyrtec again.  Follow up in 2 weeks to recheck left ear.  Call me or come in sooner if you feel sick with fever, facial/sinus pain or pressure.

## 2018-07-10 NOTE — Progress Notes (Signed)
Patient ID: Courtney Myers, female    DOB: Nov 30, 1960, 58 y.o.   MRN: [id number]  PCP: Susy Frizzle, MD  Chief Complaint  Patient presents with  . Ear Pain    Patient in with c/o ear fullness to left ear. Symptoms have been ongoing    Subjective:   Courtney Myers is a 58 y.o. female, presents to clinic with CC of left ear sx continued.  She presented about one month ago with multiple sx, at that time had bilateral serous effusion in ears, tx with flonase and antihistamines, she did this for 2 weeks and she felt her nasal sx improve.  Her right ear has improved but left ear continues to have popping, clicking, "airplane ear" and a fullness to it.  It is intermittent.  She has no pain, no swelling, drainage, no decreased hearing b/l.  She denies sinus pain, pressure, post-nasal drip, HA's, sore throat, fever, chills, sweats, swollen lymph nodes.     Patient Active Problem List   Diagnosis Date Noted  . Excessive sleepiness 09/08/2014  . Fibromyalgia syndrome 08/25/2014  . RA (rheumatoid arthritis) ([place])   . GERD (gastroesophageal reflux disease)   . Anxiety   . Depression   . History of shingles   . Synovitis of hand   . Cervical dystonia 12/04/2012     Prior to Admission medications   Medication Sig Start Date End Date Taking? Authorizing Provider  acetaminophen (TYLENOL) 325 MG tablet Take 650 mg by mouth once. One hour prior to infusion   Yes [provider]  baclofen (LIORESAL) 10 MG tablet Take 10 mg by mouth daily as needed for muscle spasms.  05/01/13  Yes [provider]  BIOTIN PO Take by mouth daily.   Yes [provider]  Cetirizine HCl (ZYRTEC ALLERGY) 10 MG TBDP Take 10 mg by mouth at bedtime. 06/11/18  Yes Delsa Grana, PA-C  clonazePAM (KLONOPIN) 0.5 MG tablet TAKE 1 TABLET BY MOUTH TWICE DAILY IF NEEDED FOR ANXIETY 03/14/18  Yes Marcial Pacas, MD  diphenhydrAMINE (BENADRYL) 25 mg capsule Take 25 mg by mouth once. One hour prior to  infusion   Yes [provider]  DULoxetine (CYMBALTA) 60 MG capsule TAKE 1 CAPSULE(60 MG) BY MOUTH DAILY 06/12/18  Yes Deveshwar, Abel Presto, MD  DYSPORT 500 units SOLR injection INJECT 500 UNITS INTRAMUSCULARLY EVERY 3 MONTHS (GIVEN AT PRESCRIBERS OFFICE, DISCARD UNUSED AFTER 1ST USE) 01/16/18  Yes Marcial Pacas, MD  folic acid (FOLVITE) 1 MG tablet Take 1 tablet (1 mg total) by mouth daily. 08/14/17 11/07/18 Yes Deveshwar, Abel Presto, MD  inFLIXimab (REMICADE) 100 MG injection Inject 6 mg/kg into the vein every 6 (six) weeks. Infuse 500 mg/ 5 vials in 265mL NS over 2hours every 6 weeks   Yes Deveshwar, Shaili, MD  methotrexate 50 MG/2ML injection INJECT 0.3 ML INTO THE SKIN ONCE A WEEK 06/11/18  Yes Deveshwar, Abel Presto, MD  Multiple Vitamins-Minerals (MULTIVITAMINS THER. W/MINERALS) TABS Take 1 tablet by mouth daily.   Yes [provider]  omeprazole (PRILOSEC) 20 MG capsule TAKE 1 CAPSULE BY MOUTH EVERY DAY 06/12/18  Yes Finney, Modena Nunnery, MD  traMADol (ULTRAM) 50 MG tablet Take 1 - 2 every 8 hours as needed for pain. 10/22/17  Yes Dixon, Stanton Kidney B, PA-C  TUBERCULIN SYR 1CC/27GX1/2" (B-D TB SYRINGE 1CC/27GX1/2") 27G X 1/2" 1 ML MISC use 1 TB syringe once weekly 11/29/17  Yes Deveshwar, Abel Presto, MD  TURMERIC PO Take by mouth daily.   Yes [provider]  Xylitol (XYLIMELTS MT) Use as directed in the mouth or throat.   Yes [provider]     No Known Allergies   Family History  Problem Relation Age of Onset  . Arthritis Mother   . Heart Problems Father   . Heart disease Father   . Heart disease Paternal Uncle        MI     Social History   Socioeconomic History  . Marital status: Married    Spouse name: [name]  . Number of children: 2  . Years of education: 59  . Highest education level: Not on file  Occupational History    Employer: [employer]  . Financial resource strain: Not on file  . Food insecurity:    Worry: Not on file    Inability:  Not on file  . Transportation needs:    Medical: Not on file    Non-medical: Not on file  Tobacco Use  . Smoking status: Former Smoker    Packs/day: 0.50    Years: 30.00    Pack years: 15.00    Types: Cigarettes    Last attempt to quit: 05/02/2015    Years since quitting: 3.1  . Smokeless tobacco: Never Used  Substance and Sexual Activity  . Alcohol use: Yes    Alcohol/week: 0.0 standard drinks    Comment: Rare  . Drug use: Never  . Sexual activity: Yes    Birth control/protection: Surgical    Comment: 1st intercourse 58 yo-Fewer than 5 partners  Lifestyle  . Physical activity:    Days per week: Not on file    Minutes per session: Not on file  . Stress: Not on file  Relationships  . Social connections:    Talks on phone: Not on file    Gets together: Not on file    Attends religious service: Not on file    Active member of club or organization: Not on file    Attends meetings of clubs or organizations: Not on file    Relationship status: Not on file  . Intimate partner violence:    Fear of current or ex partner: Not on file    Emotionally abused: Not on file    Physically abused: Not on file    Forced sexual activity: Not on file  Other Topics Concern  . 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 Right handed.    Caffeine- 4 cups daily.     Review of Systems  Constitutional: Negative.   HENT: Negative.   Eyes: Negative.   Respiratory: Negative.   Cardiovascular: Negative.   Gastrointestinal: Negative.   Endocrine: Negative.   Genitourinary: Negative.   Musculoskeletal: Negative.   Skin: Negative.   Allergic/Immunologic: Negative.   Neurological: Negative.   Hematological: Negative.   Psychiatric/Behavioral: Negative.   All other systems reviewed and are negative.      Objective:    Vitals:   07/10/18 1100  BP: 114/80  Pulse: 88    Resp: 14  Temp: 97.8 F (36.6 C)  TempSrc: Oral  SpO2: 98%  Weight: 175 lb 6 oz (79.5 kg)  Height: 5' 5.25" (1.657 m)      Physical Exam Vitals signs and nursing note reviewed.  Constitutional:      General: She is not in acute distress.    Appearance: She is well-developed.  She is not ill-appearing, toxic-appearing or diaphoretic.  HENT:     Head: Normocephalic and atraumatic.     Right Ear: Hearing, tympanic membrane, ear canal and external ear normal. No decreased hearing noted. No drainage, swelling or tenderness. No middle ear effusion. There is no impacted cerumen.     Left Ear: Hearing, ear canal and external ear normal. No decreased hearing noted. No drainage, swelling or tenderness. A middle ear effusion is present. There is no impacted cerumen. No mastoid tenderness. Tympanic membrane is scarred. Tympanic membrane is not injected, perforated, erythematous or bulging.     Ears:     Comments: Grossly normal hearing b/l Left TM dull, opaque, white, some landmarks obscured    Nose: Mucosal edema (very tight nasal passages, very erythematous nasal mucosa), congestion and rhinorrhea present. No nasal tenderness.     Right Turbinates: Not pale.     Left Turbinates: Not pale.     Right Sinus: No maxillary sinus tenderness or frontal sinus tenderness.     Left Sinus: No maxillary sinus tenderness or frontal sinus tenderness.     Mouth/Throat:     Mouth: Mucous membranes are moist. Mucous membranes are not pale.     Pharynx: Oropharynx is clear. Uvula midline. No oropharyngeal exudate, posterior oropharyngeal erythema or uvula swelling.     Tonsils: No tonsillar abscesses.  Eyes:     General: No scleral icterus.       Right eye: No discharge.        Left eye: No discharge.     Conjunctiva/sclera: Conjunctivae normal.     Pupils: Pupils are equal, round, and reactive to light.  Neck:     Musculoskeletal: Normal range of motion and neck supple.     Trachea: No tracheal  deviation.  Cardiovascular:     Rate and Rhythm: Normal rate and regular rhythm.     Heart sounds: Normal heart sounds.  Pulmonary:     Effort: Pulmonary effort is normal. No respiratory distress.     Breath sounds: No stridor. No wheezing, rhonchi or rales.  Abdominal:     General: Bowel sounds are normal.     Palpations: Abdomen is soft.  Musculoskeletal: Normal range of motion.  Lymphadenopathy:     Head:     Right side of head: No submental, submandibular, tonsillar, preauricular or posterior auricular adenopathy.     Left side of head: No submental, submandibular, tonsillar, preauricular or posterior auricular adenopathy.     Cervical: No cervical adenopathy.  Skin:    General: Skin is warm and dry.     Coloration: Skin is not pale.     Findings: No rash.  Neurological:     Mental Status: She is alert.     Motor: No abnormal muscle tone.     Coordination: Coordination normal.  Psychiatric:        Behavior: Behavior normal.           Assessment & Plan:   Pt presents for follow up on URI illness and b/l serous effusion    ICD-10-CM   1. Left chronic serous otitis media H65.22   2. Rhinosinusitis J32.9     Left ear sx for 7 months now, one month ago pt had presented was found to have bilateral serous effusions was treated with Flonase and antihistamines.  Right ear has improved tympanic membrane is normal-appearing  Left TM is dull opaque appears to have some semblance of scarring of the tympanic membrane but also the  diffuse dullness suspicious for some continued effusion vs purulence otitis media however there is no bulging or erythema.  She has not pain or fever.  She also has stopped on the medicine 2 weeks ago and her left ear symptoms have continued.  Treat with Flonase and antihistamines again recheck her left ear in 2 weeks.  If not improving could add Singulair or referral to ENT.  Her symptoms are fairly mild and intermittent they just are bothersome.  She  does appear to have an active viral infection and or chronic rhinosinusitis -supportive and symptomatic treatment, follow-up with any acute worsening.  Her hearing has improved since last month and her posterior oropharynx has improved.     Delsa Grana, PA-C 07/10/18 11:21 AM

## 2018-07-16 ENCOUNTER — Other Ambulatory Visit: Payer: Self-pay | Admitting: Family Medicine

## 2018-07-16 DIAGNOSIS — Z1231 Encounter for screening mammogram for malignant neoplasm of breast: Secondary | ICD-10-CM

## 2018-07-17 NOTE — Telephone Encounter (Signed)
New pre-cert group phone number- [phone]

## 2018-07-17 NOTE — Telephone Encounter (Signed)
Called to check status of Pre-cert submitted on 83/43, was informed that the request was cancelled due to a change in the plan on 07/03/2018. Submitted a new Pre-cert for [id number] for 9 visits based on dosing schedule (every 6 weeks). CPT codes [phone] and [phone] do not require a pre-cert. Awaiting response. Patient's next infusion is 2/6, requested new auth to begin by 08/03/2018.  Faxed clinicals to [phone] as requested by the plan.  Ref# [id number]

## 2018-07-18 ENCOUNTER — Other Ambulatory Visit: Payer: Self-pay | Admitting: Rheumatology

## 2018-07-18 NOTE — Telephone Encounter (Signed)
Last visit: 03/06/18 Next visit: 08/15/18  Okay to refill per Dr. Estanislado Pandy

## 2018-07-24 ENCOUNTER — Ambulatory Visit: Payer: 59 | Admitting: Family Medicine

## 2018-07-25 NOTE — Telephone Encounter (Signed)
Received a fax from [employer] regarding a prior authorization for Remicade. Authorization has been APPROVED from 07/17/2018 to 07/18/2019.   Will send document to scan center.  Authorization # [id number]  Patient was approved for 720 units.

## 2018-07-29 ENCOUNTER — Encounter: Payer: Self-pay | Admitting: Family Medicine

## 2018-08-01 NOTE — Progress Notes (Signed)
Office Visit Note  Patient: Courtney Myers             Date of Birth: 03/16/1961           MRN: [id number]             PCP: Susy Frizzle, MD Referring: Orlena Sheldon, PA-C Visit Date: 08/15/2018 Occupation: @GUAROCC @  Subjective:  Medication monitoring   History of Present Illness: Courtney Myers is a 58 y.o. female with history of seronegative rheumatoid arthritis and fibromyalgia.  She is on Remicade 6 mg/kg  Infusions every 6 weeks and MTX 0.3 ml sq once weekly. She is taking Cymbalta 60 mg po daily and Tramadol 50 mg 1-2 tablets every 8 hours for pain relief. She has been taking tramadol very sparingly.  She denies any recent rheumatoid arthritis flares.  She states her last Remicade infusion was on Thursday.  She denies any breakthrough pain and feels as though Remicade has been effective.  She has occasional discomfort in bilateral hands and bilateral feet.  She states she is noticed that she is developing hammertoes in both feet.  She denies any joint swelling at this time.  She states that she had a severe fibromyalgia flare in January due to increased stress and weather changes.  She states that she rested and slept in the flare resolved on its own.  She states that she has been trying to exercise and stay active.  She states that she has been sleeping better and her fatigue has improved slightly.   Activities of Daily Living:  Patient reports morning stiffness for 10-15 minutes.   Patient Reports nocturnal pain.  Difficulty dressing/grooming: Denies Difficulty climbing stairs: Denies Difficulty getting out of chair: Reports Difficulty using hands for taps, buttons, cutlery, and/or writing: Denies  Review of Systems  Constitutional: Positive for fatigue.  HENT: Positive for mouth sores, mouth dryness and nose dryness.   Eyes: Positive for dryness. Negative for pain, itching and visual disturbance.  Respiratory: Negative for cough, hemoptysis, shortness of  breath, wheezing and difficulty breathing.   Cardiovascular: Negative for chest pain, palpitations, hypertension and swelling in legs/feet.  Gastrointestinal: Negative for abdominal pain, blood in stool, constipation and diarrhea.  Endocrine: Negative for increased urination.  Genitourinary: Negative for painful urination, nocturia and pelvic pain.  Musculoskeletal: Positive for arthralgias, joint pain and morning stiffness. Negative for joint swelling, myalgias, muscle weakness, muscle tenderness and myalgias.  Skin: Positive for nodules/bumps. Negative for color change, pallor, rash, hair loss, redness, skin tightness, ulcers and sensitivity to sunlight.  Allergic/Immunologic: Negative for susceptible to infections.  Neurological: Negative for dizziness, light-headedness, numbness, headaches and memory loss.  Hematological: Negative for bruising/bleeding tendency and swollen glands.  Psychiatric/Behavioral: Negative for depressed mood, confusion and sleep disturbance. The patient is not nervous/anxious.     PMFS History:  Patient Active Problem List   Diagnosis Date Noted  . Excessive sleepiness 09/08/2014  . Fibromyalgia syndrome 08/25/2014  . RA (rheumatoid arthritis) ([place])   . GERD (gastroesophageal reflux disease)   . Anxiety   . Depression   . History of shingles   . Synovitis of hand   . Cervical dystonia 12/04/2012    Past Medical History:  Diagnosis Date  . Anxiety   . Cervical dystonia   . Depression   . Fibromyalgia   . GERD (gastroesophageal reflux disease)   . History of shingles   . RA (rheumatoid arthritis) ([place])   . Synovitis of hand  Family History  Problem Relation Age of Onset  . Arthritis Mother   . Heart Problems Father   . Heart disease Father   . Heart disease Paternal Uncle        MI   Past Surgical History:  Procedure Laterality Date  . TONSILLECTOMY    . 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 Right handed.    Caffeine- 4 cups daily.   Immunization History  Administered Date(s) Administered  . Influenza,inj,Quad PF,6+ Mos 05/25/2018  . Influenza-Unspecified 06/24/2014, 05/01/2015, 04/10/2016, 04/06/2017  . Pneumococcal Conjugate-13 05/01/2015  . Pneumococcal Polysaccharide-23 04/10/2016  . Tdap 09/09/2015     Objective: Vital Signs: BP 105/73 (BP Location: Left Arm, Patient Position: Sitting, Cuff Size: Normal)   Pulse 86   Resp 14   Ht 5\' 6"  (1.676 m)   Wt 176 lb 9.6 oz (80.1 kg)   BMI 28.50 kg/m    Physical Exam Vitals signs and nursing note reviewed.  Constitutional:      Appearance: She is well-developed.  HENT:     Head: Normocephalic and atraumatic.  Eyes:     Conjunctiva/sclera: Conjunctivae normal.  Neck:     Musculoskeletal: Normal range of motion.  Cardiovascular:     Rate and Rhythm: Normal rate and regular rhythm.     Heart sounds: Normal heart sounds.  Pulmonary:     Effort: Pulmonary effort is normal.     Breath sounds: Normal breath sounds.  Abdominal:     General: Bowel sounds are normal.     Palpations: Abdomen is soft.  Lymphadenopathy:     Cervical: No cervical adenopathy.  Skin:    General: Skin is warm and dry.     Capillary Refill: Capillary refill takes less than 2 seconds.  Neurological:     Mental Status: She is alert and oriented to person, place, and time.  Psychiatric:        Behavior: Behavior normal.      Musculoskeletal Exam: C-spine, thoracic spine, and lumbar spine good ROM.  No midline spinal tenderness.  No SI joint tenderness.  Shoulder joints, elbow joints, wrist joints, MCPs, PIPs, and DIPs good ROM with no synovitis.  Complete fist formation bilaterally.  PIP and DIP synovial thickening consistent with osteoarthritis.  Hip joints, knee joints, ankle joints, MTPs, PIPs, and DIPs good ROM with no  synovitis.  No warmth or effusion of knee joints.  No tenderness or swelling of ankle joints.  Hammertoes present bilaterally.  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  No tenderness over trochanteric bursa bilaterally.   CDAI Exam: CDAI Score: 0.8  Patient Global Assessment: 4 (mm); Provider Global Assessment: 4 (mm) Swollen: 0 ; Tender: 0  Joint Exam   Not documented   There is currently no information documented on the homunculus. Go to the Rheumatology activity and complete the homunculus joint exam.  Investigation: No additional findings.  Imaging: No results found.  Recent Labs: Lab Results  Component Value Date   WBC 7.3 08/08/2018   HGB 12.7 08/08/2018   PLT 228 08/08/2018   NA 137 08/08/2018   K 4.1 08/08/2018   CL 105 08/08/2018   CO2 24 08/08/2018   GLUCOSE 103 (H) 08/08/2018   BUN 13 08/08/2018   CREATININE 0.84 08/08/2018   BILITOT 0.4 08/08/2018   ALKPHOS  65 08/08/2018   AST 23 08/08/2018   ALT 24 08/08/2018   PROT 6.8 08/08/2018   ALBUMIN 3.7 08/08/2018   CALCIUM 9.1 08/08/2018   GFRAA >60 08/08/2018   QFTBGOLD Negative 09/14/2015   QFTBGOLDPLUS Negative 02/14/2018    Speciality Comments: Remicade 6mg /kg every 6 weeks  TB gold negative 10/27/16  Procedures:  No procedures performed Allergies: Patient has no known allergies.   Assessment / Plan:     Visit Diagnoses: Rheumatoid arthritis of multiple sites with negative rheumatoid factor ([place]): She has no synovitis on exam.  She has not had any recent rheumatoid arthritis flares.  She is clinically doing well on Remicade 6 mg/kg every 6 weeks, methotrexate 0.3 mL subcutaneous injections once weekly and folic acid 1 mg by mouth daily.  She has occasional discomfort and stiffness in bilateral hands and bilateral feet.  She has hammertoes in bilateral feet.  We discussed metatarsal pads as well as seeing a podiatrist for orthotics.  She is going to try to go to [facility] to  purchase metatarsal pads.  She will continue on this current treatment regimen.  A refill folic acid was sent to the pharmacy today.  She was advised to notify us if she develops increased joint pain or joint swelling.  She will follow-up in the office in 5 months.  High risk medication use - Remicade 6mg /Kg q 6 wks and MTX 1.2RF sq qwk , Folic acid 1mg  po qd. CBC and CMP were drawn on 08/08/2018.  TB gold was negative on 02/14/2018.  She has not had any recent infections.  She will continue to get lab work with her infusions.  Fibromyalgia -She has severe fibromyalgia flare in January 2020.  She continues to have mild generalized muscle aches and muscle tenderness due to fibromyalgia.  She take Cymbalta 60 mg by mouth daily.  She takes tramadol 50 mg 1 to 2 tablets every 8 hours PRN.  She tries to take tramadol very sparingly.  She has been try to stay active and exercise on a regular basis.  She has been sleeping better at night.  Her level of fatigue has been stable.  She has been trying to control her stress and rest when needed.  She does not need any refills at this time.    Primary insomnia: She has been sleeping better at night.  Good sleep hygiene was discussed.  Chronic fatigue: She has good days and bad days with a level of fatigue that she experiences.  She was encouraged to stay active and exercise on a regular basis.  Good sleep hygiene was discussed.  Other medical conditions are listed as follows:  Anxiety and depression  History of gastroesophageal reflux (GERD)    Orders: No orders of the defined types were placed in this encounter.  Meds ordered this encounter  Medications  . folic acid (FOLVITE) 1 MG tablet    Sig: Take 1 tablet (1 mg total) by mouth daily.    Dispense:  90 tablet    Refill:  4      Follow-Up Instructions: Return in about 5 months (around 01/13/2019) for Rheumatoid arthritis, Fibromyalgia.   Ofilia Neas, PA-C  Note - This record has been created  using Dragon software.  Chart creation errors have been sought, but may not always  have been located. Such creation errors do not reflect on  the standard of medical care.

## 2018-08-05 ENCOUNTER — Other Ambulatory Visit: Payer: Self-pay | Admitting: *Deleted

## 2018-08-05 NOTE — Progress Notes (Signed)
Infusion orders are current for patient CBC CMP Tylenol Benadryl appointments are up to date and follow up appointment  is scheduled TB gold not due yet.  

## 2018-08-08 ENCOUNTER — Encounter (HOSPITAL_COMMUNITY)
Admission: RE | Admit: 2018-08-08 | Discharge: 2018-08-08 | Disposition: A | Discharge status: Home / Self Care | Payer: 59 | Source: Ambulatory Visit | Attending: Rheumatology | Admitting: Rheumatology

## 2018-08-08 DIAGNOSIS — M0609 Rheumatoid arthritis without rheumatoid factor, multiple sites: Secondary | ICD-10-CM | POA: Diagnosis not present

## 2018-08-08 LAB — COMPREHENSIVE METABOLIC PANEL
ALT: 24 U/L (ref 0–44)
AST: 23 U/L (ref 15–41)
Albumin: 3.7 g/dL (ref 3.5–5.0)
Alkaline Phosphatase: 65 U/L (ref 38–126)
Anion gap: 8 (ref 5–15)
BUN: 13 mg/dL (ref 6–20)
CO2: 24 mmol/L (ref 22–32)
Calcium: 9.1 mg/dL (ref 8.9–10.3)
Chloride: 105 mmol/L (ref 98–111)
Creatinine, Ser: 0.84 mg/dL (ref 0.44–1.00)
GFR calc Af Amer: >60 mL/min (ref >60)
GFR calc non Af Amer: >60 mL/min (ref >60)
Glucose, Bld: 103 mg/dL — ABNORMAL HIGH (ref 70–99)
Potassium: 4.1 mmol/L (ref 3.5–5.1)
Sodium: 137 mmol/L (ref 135–145)
Total Bilirubin: 0.4 mg/dL (ref 0.3–1.2)
Total Protein: 6.8 g/dL (ref 6.5–8.1)

## 2018-08-08 LAB — CBC
HCT: 38.4 % (ref 36.0–46.0)
Hemoglobin: 12.7 g/dL (ref 12.0–15.0)
MCH: 30.7 pg (ref 26.0–34.0)
MCHC: 33.1 g/dL (ref 30.0–36.0)
MCV: 92.8 fL (ref 80.0–100.0)
Platelets: 228 10*3/uL (ref 150–400)
RBC: 4.14 MIL/uL (ref 3.87–5.11)
RDW: 14.4 % (ref 11.5–15.5)
WBC: 7.3 10*3/uL (ref 4.0–10.5)
nRBC: 0 % (ref 0.0–0.2)

## 2018-08-08 MED ORDER — SODIUM CHLORIDE 0.9 % IV SOLN
6.0000 mg/kg | INTRAVENOUS | Status: DC
  Administered 2018-08-08: 500 mg via INTRAVENOUS
  Filled 2018-08-08: qty 50

## 2018-08-08 MED ORDER — ACETAMINOPHEN 325 MG PO TABS: 650.0000 mg | ORAL_TABLET | ORAL | Status: DC

## 2018-08-08 MED ORDER — DIPHENHYDRAMINE HCL 25 MG PO CAPS: 25.0000 mg | ORAL_CAPSULE | ORAL | Status: DC

## 2018-08-14 ENCOUNTER — Other Ambulatory Visit: Payer: Self-pay | Admitting: Rheumatology

## 2018-08-14 NOTE — Telephone Encounter (Signed)
Last visit: 03/06/18 Next visit: 08/15/18  Okay to refill per Dr. Estanislado Pandy

## 2018-08-15 ENCOUNTER — Ambulatory Visit: Payer: 59 | Admitting: Physician Assistant

## 2018-08-15 ENCOUNTER — Encounter: Payer: Self-pay | Admitting: Physician Assistant

## 2018-08-15 VITALS — BP 105/73 | HR 86 | Resp 14 | Ht 66.0 in | Wt 176.6 lb

## 2018-08-15 DIAGNOSIS — F5101 Primary insomnia: Secondary | ICD-10-CM | POA: Diagnosis not present

## 2018-08-15 DIAGNOSIS — M797 Fibromyalgia: Secondary | ICD-10-CM

## 2018-08-15 DIAGNOSIS — Z79899 Other long term (current) drug therapy: Secondary | ICD-10-CM | POA: Diagnosis not present

## 2018-08-15 DIAGNOSIS — F32A Depression, unspecified: Secondary | ICD-10-CM

## 2018-08-15 DIAGNOSIS — Z8719 Personal history of other diseases of the digestive system: Secondary | ICD-10-CM

## 2018-08-15 DIAGNOSIS — M0609 Rheumatoid arthritis without rheumatoid factor, multiple sites: Secondary | ICD-10-CM | POA: Diagnosis not present

## 2018-08-15 DIAGNOSIS — F329 Major depressive disorder, single episode, unspecified: Secondary | ICD-10-CM

## 2018-08-15 DIAGNOSIS — R5382 Chronic fatigue, unspecified: Secondary | ICD-10-CM

## 2018-08-15 DIAGNOSIS — F419 Anxiety disorder, unspecified: Secondary | ICD-10-CM

## 2018-08-15 MED ORDER — FOLIC ACID 1 MG PO TABS: 1.0000 mg | ORAL_TABLET | Freq: Every day | ORAL | 4 refills | Status: DC

## 2018-08-28 DIAGNOSIS — H6522 Chronic serous otitis media, left ear: Secondary | ICD-10-CM | POA: Diagnosis not present

## 2018-08-28 DIAGNOSIS — H9312 Tinnitus, left ear: Secondary | ICD-10-CM | POA: Diagnosis not present

## 2018-08-28 DIAGNOSIS — H9042 Sensorineural hearing loss, unilateral, left ear, with unrestricted hearing on the contralateral side: Secondary | ICD-10-CM | POA: Diagnosis not present

## 2018-09-03 ENCOUNTER — Encounter: Payer: Self-pay | Admitting: Rheumatology

## 2018-09-03 ENCOUNTER — Encounter: Payer: Self-pay | Admitting: *Deleted

## 2018-09-04 DIAGNOSIS — J322 Chronic ethmoidal sinusitis: Secondary | ICD-10-CM | POA: Diagnosis not present

## 2018-09-04 DIAGNOSIS — J320 Chronic maxillary sinusitis: Secondary | ICD-10-CM | POA: Diagnosis not present

## 2018-09-04 DIAGNOSIS — H903 Sensorineural hearing loss, bilateral: Secondary | ICD-10-CM | POA: Diagnosis not present

## 2018-09-06 ENCOUNTER — Other Ambulatory Visit: Payer: Self-pay | Admitting: *Deleted

## 2018-09-06 NOTE — Progress Notes (Signed)
Infusion orders are current for patient CBC CMP Tylenol Benadryl appointments are up to date and follow up appointment  is scheduled TB gold not due yet.  

## 2018-09-09 ENCOUNTER — Other Ambulatory Visit: Payer: Self-pay | Admitting: Rheumatology

## 2018-09-09 NOTE — Telephone Encounter (Signed)
Last Visit: 08/15/18 Next Visit: 01/10/19  Okay to refill per Dr. Estanislado Pandy

## 2018-09-11 ENCOUNTER — Telehealth: Payer: Self-pay | Admitting: Neurology

## 2018-09-11 NOTE — Telephone Encounter (Signed)
I called the pharmacy [facility] at [phone]. They stated they called the patient for consent but she denied it back in February. I asked them to start the request again. I called the patient to make her aware but she did not answer so I left her a VM asking her to call back. If she calls back please just give her the number to [facility] [place].

## 2018-09-11 NOTE — Telephone Encounter (Signed)
Pt returned Danielle's call. Message relayed, phone number given, pt said she will call right away  [facility]

## 2018-09-12 NOTE — Telephone Encounter (Signed)
Noted, thank you

## 2018-09-12 NOTE — Telephone Encounter (Signed)
Scheduled for delivery on 03.17.20. DW

## 2018-09-13 IMAGING — XA DG FLUORO GUIDE NDL PLC/BX
3 series · 3 of 3 positions shown · non-contrast
Comparison: none

CLINICAL DATA: Chronic left wrist pain.

[Series 1: ortho standard · 1 of 1 slices shown (1 of 3)]
[im 1/1]
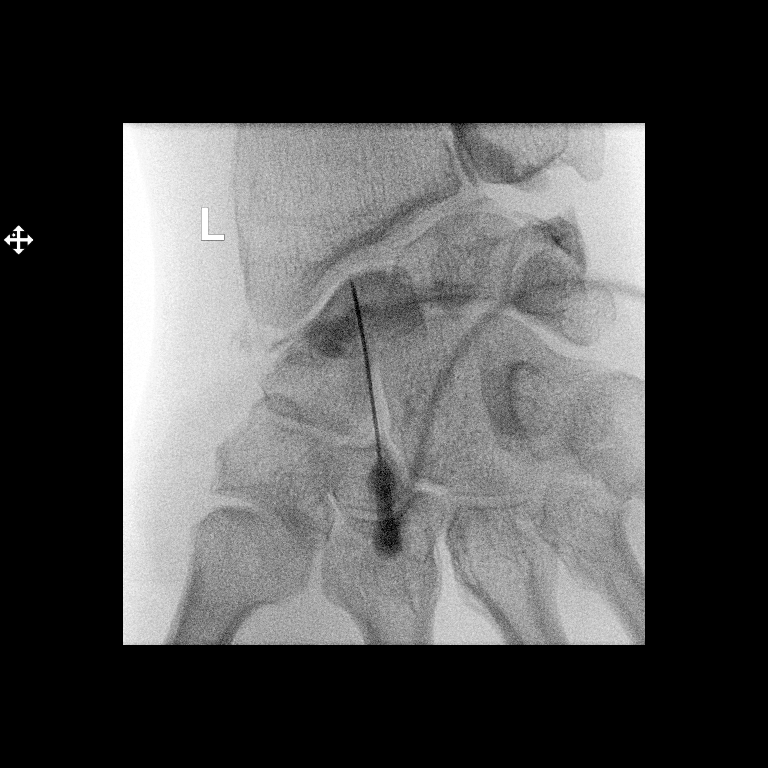

[Series 2: ortho standard · 1 of 1 slices shown (2 of 3)]
[im 1/1]
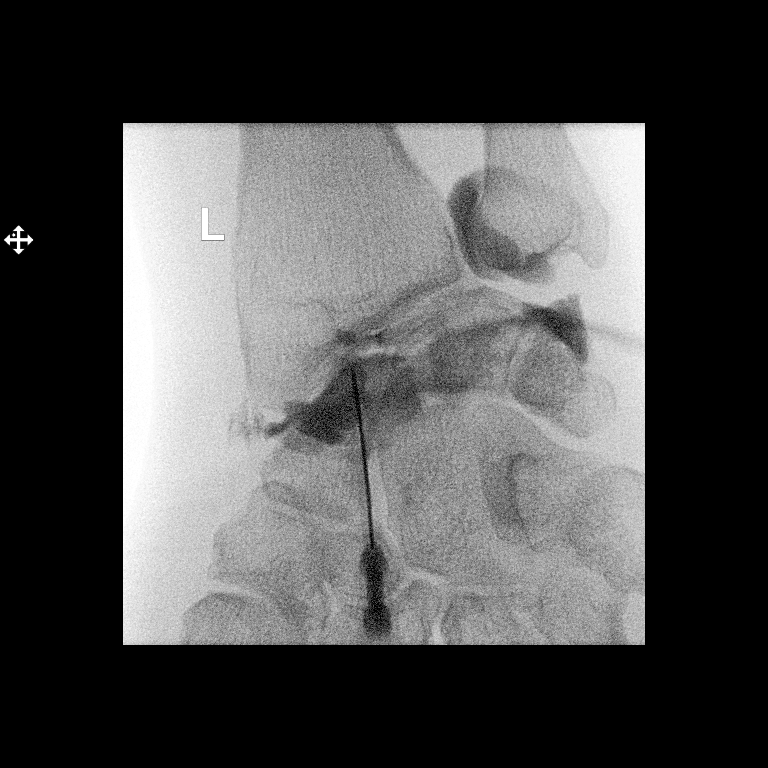

[Series 3: ortho standard · 1 of 1 slices shown (3 of 3)]
[im 1/1]
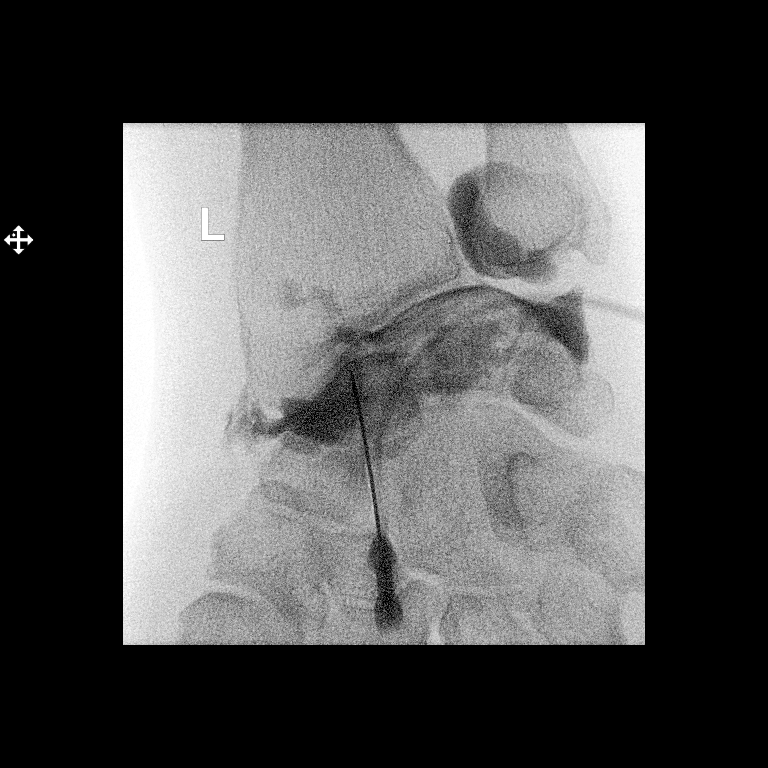

[3 of 3 positions shown; findings below may reference images not displayed]

FLUOROSCOPY TIME:  Radiation Exposure Index (as provided by the
fluoroscopic device): 0.37 uGy*m2

Fluoroscopy Time:  12

Number of Acquired Images:  0

PROCEDURE:
Left WRIST INJECTION UNDER FLUOROSCOPY

An appropriate skin entrance site was determined. The site was
marked, prepped with Betadine, draped in the usual sterile fashion,
and infiltrated locally with 1% Lidocaine. A 25 gauge skin needle
was advanced into the radiocarpal joint under intermittent
fluoroscopy. A mixture of 0.1 ml Multihance and 20 ml of dilute
Isovue-M 200 was then used to opacify the proximal carpal joint. No
immediate complication.
IMPRESSION: Technically successful left wrist injection for MRI.

## 2018-09-13 IMAGING — MR MR WRIST*L* W/CM
5 series · 36 of 40 positions shown · IV contrast (agent unspecified)
Comparison: None.

CLINICAL DATA: Wrist pain, inflammatory arthritis.

EXAM:
MRI OF THE LEFT WRIST WITH CONTRAST(MR Arthrogram)
TECHNIQUE: Multiplanar, multisequence MR imaging of the wrist was performed
immediately following contrast injection into the radiocarpal joint
under fluoroscopic guidance. No intravenous contrast was
administered.

[Series 6: T2 fat-sat · axial · 3.0mm · 0.39mm/px · z∈[-33,+48]mm · 8 of 26 slices shown (1 of 2)]
[im 1/26]
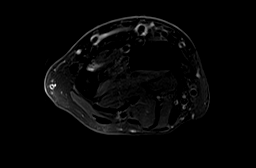
[im 3/26]
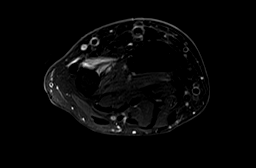
[im 9/26]
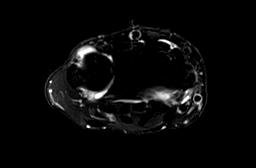
[im 12/26]
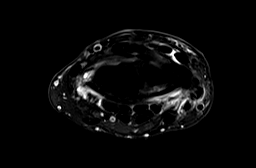
[im 14/26]
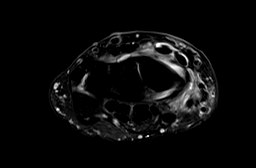
[im 17/26]
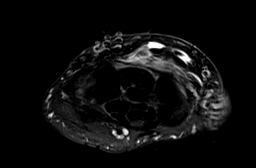
[im 23/26]
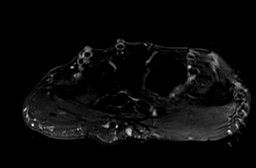
[im 26/26]
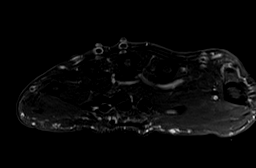

[Series 7: t1_tse_cor_fs · coronal · 3.0mm · 0.20mm/px · 7 of 17 slices shown]
[im 1/17]
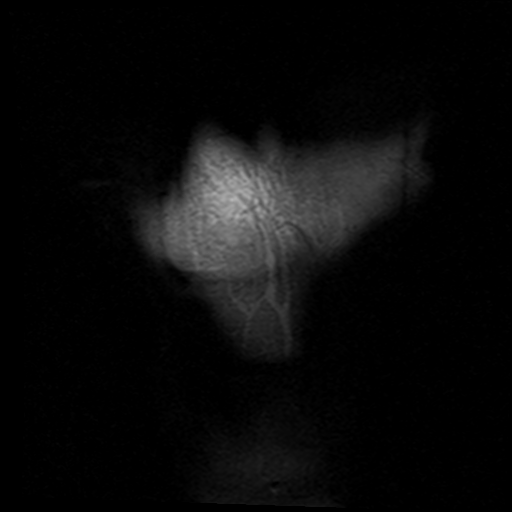
[im 3/17]
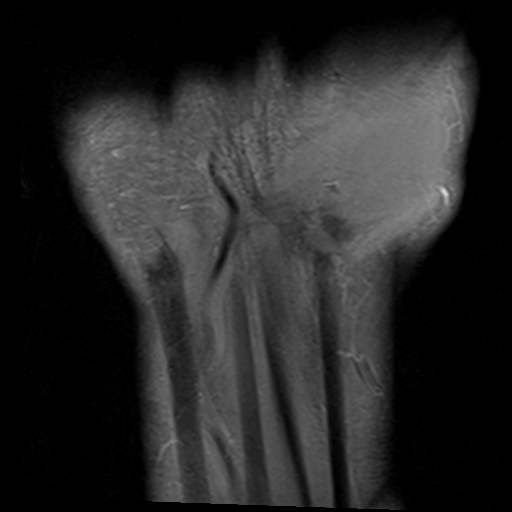
[im 6/17]
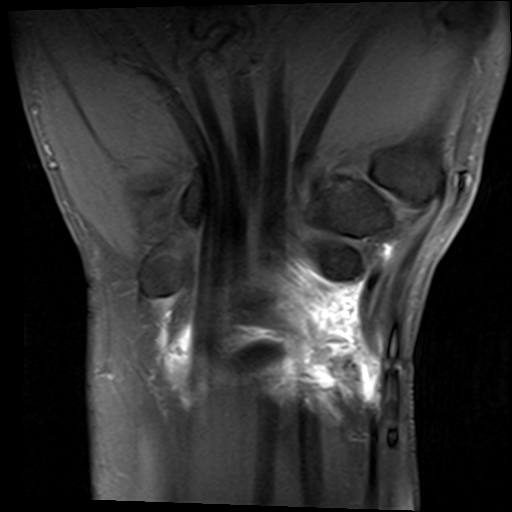
[im 9/17]
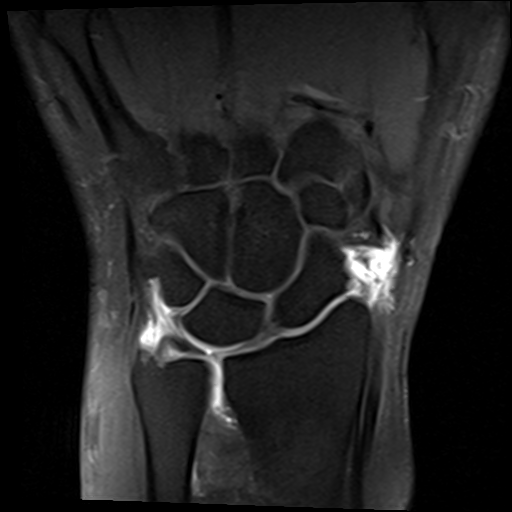
[im 11/17]
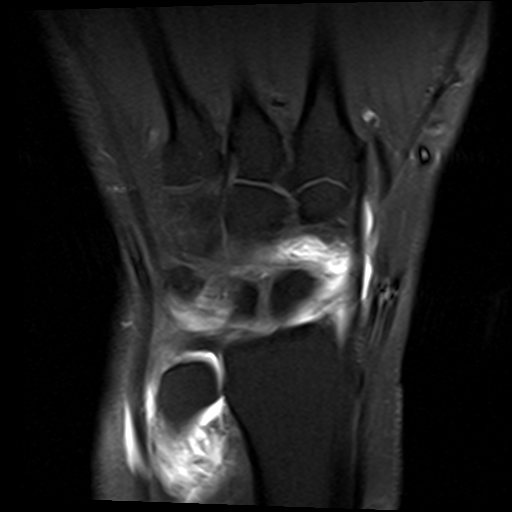
[im 14/17]
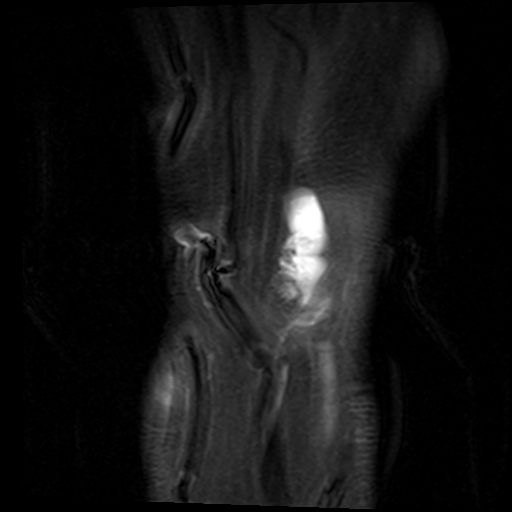
[im 17/17]
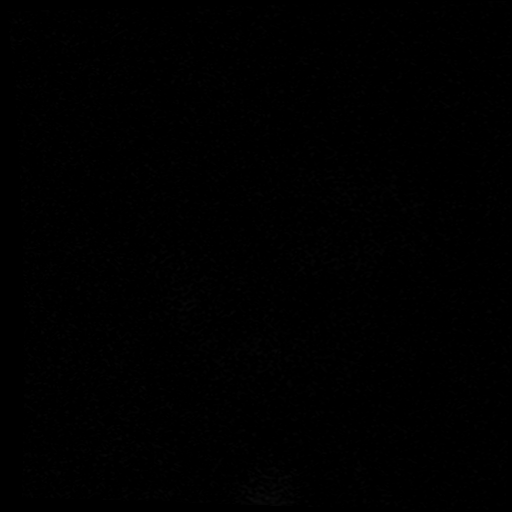

[Series 8: t1_tse_sag_fs · sagittal · 3.0mm · 0.20mm/px · 9 of 21 slices shown]
[im 1/21]
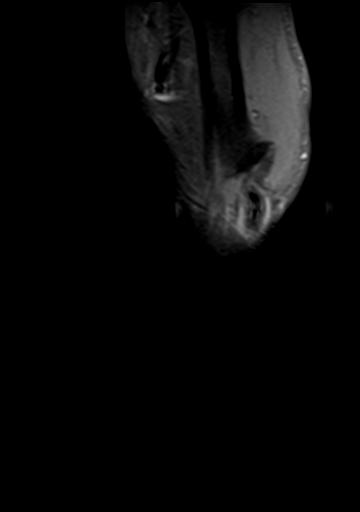
[im 3/21]
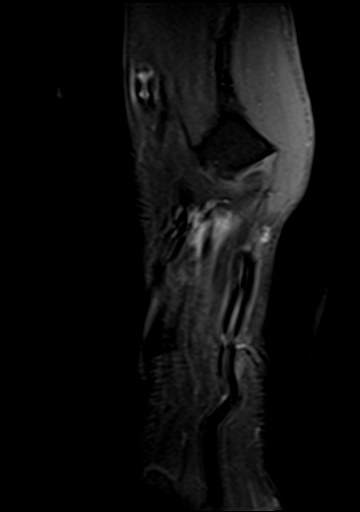
[im 6/21]
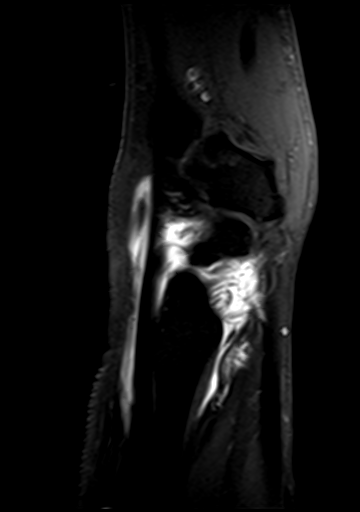
[im 8/21]
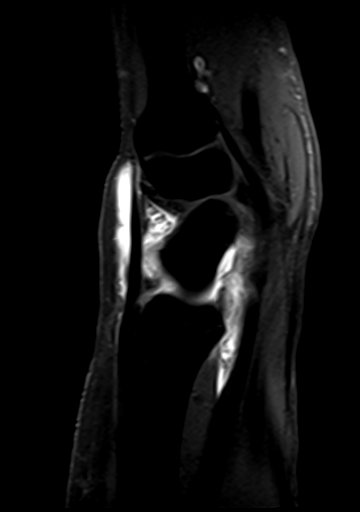
[im 11/21]
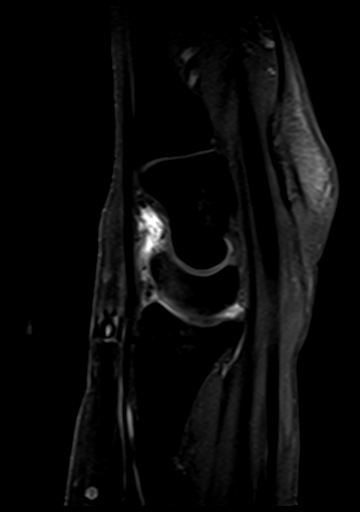
[im 13/21]
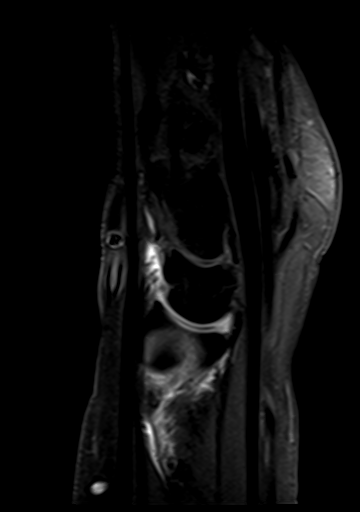
[im 16/21]
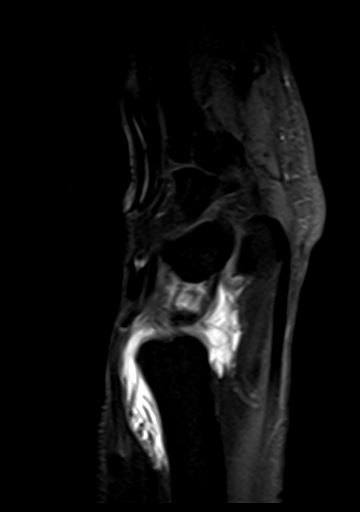
[im 18/21]
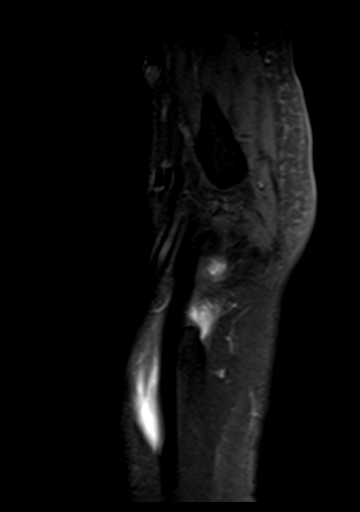
[im 21/21]
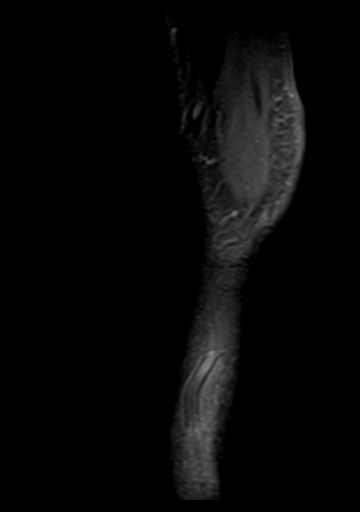

[Series 9: t1_tse_cor_no fs · coronal · 3.0mm · 0.31mm/px · 5 of 17 slices shown]
[im 1/17]
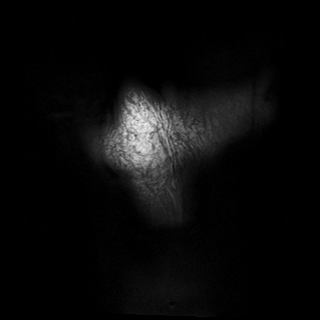
[im 3/17]
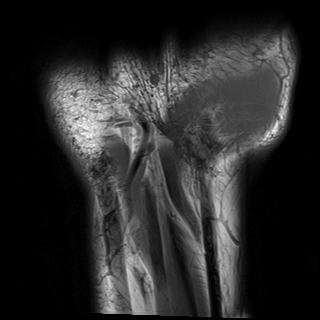
[im 6/17]
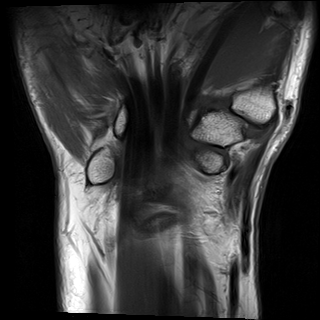
[im 9/17]
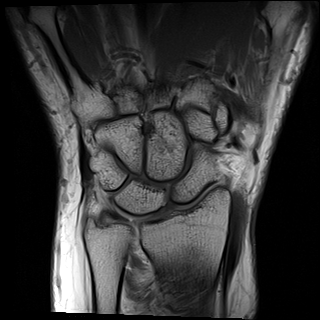
[im 11/17]
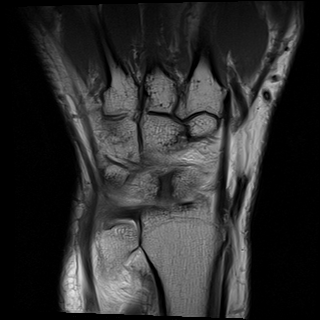

[Series 10: T2 fat-sat · coronal · 3.0mm · 0.39mm/px · 7 of 17 slices shown (2 of 2)]
[im 1/17]
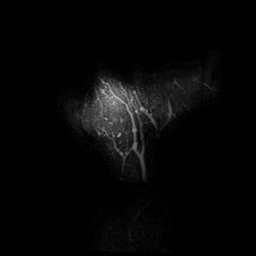
[im 3/17]
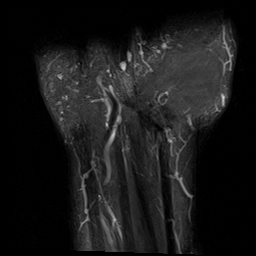
[im 6/17]
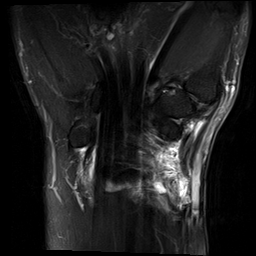
[im 9/17]
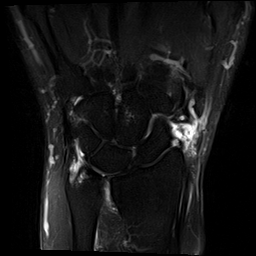
[im 11/17]
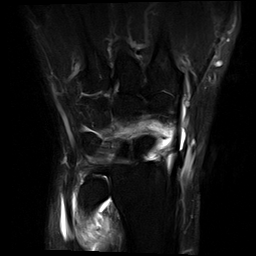
[im 14/17]
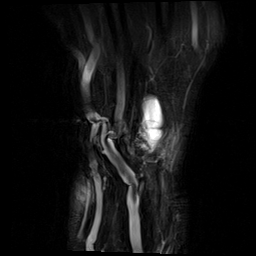
[im 17/17]
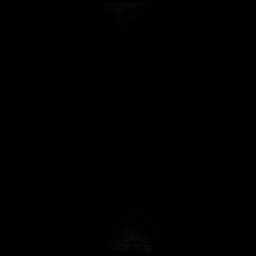

[36 of 40 positions shown; findings below may reference images not displayed]

FINDINGS: Ligaments: Intact scapholunate and lunotriquetral ligaments. Intact
dorsal and volar intercarpal ligaments.

Triangular fibrocartilage: Perforation of the central portion of the
triangular fibrocartilage complex.

Tendons: Intact flexor compartment tendons. Mild tendinosis of the
abductor pollicis longus with a small partial tear and mild
tenosynovitis.

Carpal tunnel/median nerve: Normal carpal tunnel. Normal median
nerve.

Guyon's canal: Normal Guyon's canal.

Joint/cartilage: Mild partial-thickness cartilage loss of the first
CMC joint. Otherwise, no chondral defect. No significant joint
effusion involving the CMC joints or mid carpal joint.

Bones/carpal alignment: No acute osseous abnormality. No aggressive
osseous lesion. No fracture or dislocation. No erosive changes.
Normal alignment.

Other: No fluid collection or hematoma.
IMPRESSION: 1. Mild tendinosis of the abductor pollicis longus with a small
partial tear and mild tenosynovitis.
2. Perforation of the central portion of the triangle fibrocartilage
complex.
3. Mild osteoarthritis of the first CMC joint.

## 2018-09-16 DIAGNOSIS — G243 Spasmodic torticollis: Secondary | ICD-10-CM | POA: Diagnosis not present

## 2018-09-18 ENCOUNTER — Other Ambulatory Visit: Payer: Self-pay

## 2018-09-19 ENCOUNTER — Ambulatory Visit: Payer: Self-pay | Admitting: Neurology

## 2018-09-19 ENCOUNTER — Encounter (HOSPITAL_COMMUNITY)
Admission: RE | Admit: 2018-09-19 | Discharge: 2018-09-19 | Disposition: A | Discharge status: Home / Self Care | Payer: 59 | Source: Ambulatory Visit | Attending: Rheumatology | Admitting: Rheumatology

## 2018-09-19 DIAGNOSIS — M0609 Rheumatoid arthritis without rheumatoid factor, multiple sites: Secondary | ICD-10-CM | POA: Diagnosis not present

## 2018-09-19 MED ORDER — SODIUM CHLORIDE 0.9 % IV SOLN
6.0000 mg/kg | INTRAVENOUS | Status: AC
  Administered 2018-09-19: 500 mg via INTRAVENOUS
  Filled 2018-09-19: qty 50

## 2018-09-19 MED ORDER — ACETAMINOPHEN 325 MG PO TABS: 650.0000 mg | ORAL_TABLET | ORAL | Status: DC

## 2018-09-19 MED ORDER — DIPHENHYDRAMINE HCL 25 MG PO CAPS: 25.0000 mg | ORAL_CAPSULE | ORAL | Status: DC

## 2018-09-23 ENCOUNTER — Telehealth: Payer: Self-pay | Admitting: Neurology

## 2018-09-23 ENCOUNTER — Other Ambulatory Visit: Payer: Self-pay | Admitting: Neurology

## 2018-09-23 NOTE — Telephone Encounter (Addendum)
Pt called needing to r/s her Dysport Injection. Please advise.

## 2018-09-24 MED ORDER — CLONAZEPAM 0.5 MG PO TABS: ORAL_TABLET | ORAL | 5 refills | Status: DC

## 2018-09-24 NOTE — Addendum Note (Signed)
Addended by: Marcial Pacas on: 09/24/2018 01:11 PM   Modules accepted: Orders

## 2018-09-24 NOTE — Addendum Note (Signed)
Addended by: Noberto Retort C on: 09/24/2018 10:46 AM   Modules accepted: Orders

## 2018-09-24 NOTE — Telephone Encounter (Signed)
I called and spoke with the patient and explained the office closure. I told her we would be in contact soon. She stated that she had requested a refill from her pharmacy for clonazepam and wanted to know if this could be done today. I told her I would make the nurse aware. No need to return call.

## 2018-09-26 NOTE — Telephone Encounter (Signed)
Patients medication is here. DW

## 2018-10-01 ENCOUNTER — Other Ambulatory Visit: Payer: Self-pay

## 2018-10-01 ENCOUNTER — Ambulatory Visit: Payer: 59 | Admitting: Neurology

## 2018-10-01 ENCOUNTER — Encounter: Payer: Self-pay | Admitting: Neurology

## 2018-10-01 VITALS — BP 138/86 | HR 92 | Ht 66.0 in | Wt 182.0 lb

## 2018-10-01 DIAGNOSIS — G243 Spasmodic torticollis: Secondary | ICD-10-CM | POA: Diagnosis not present

## 2018-10-01 NOTE — Progress Notes (Signed)
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  Symptom onset was without apparent triggering event, she has gradually developed this neck pulling,  manifested primarily with right laterocollis, mild retrocolis, and left shoulder elevation since 2000. She has good relief with regular Botox injection, about 4 times a year since 2003. Last injection was February 18, 2010 by Dr. Marta Antu,  She reported great relief as usual, taking 4- 5 days to get symptom relieve, lasting about 3 months.  I began to inject her since 12.2011, every 3-4 months, dosage has been limited to 150 units of BOTOX A.  She denies neck pain, gait difficulty, but with wearing off Botox, she noticed increased head tremor, and pulling towards her right shoulder.  She developed  transient difficulty lifting her neck up when using BOTOX A 200 units previously, reponding well to decreased dose of 150 units  She was enrolled into IPSEN dysport study, first injection was in April 30th 2013, she later enrolled into open label, 2nd injection was in  May 28th 2013. She did very well.  Recent few weeks,  She had experienced flareup of her rheumatoid arthritis, has received IV infusion. Last injection was in 06/10/2012,  500 units of dysport was dissolved into 2 cc of normal saline. Right longissimus capitus 0.5 cc Right splenius capitis 0.5 cc Right levator scapular 0.5 cc Right iliocostalis 0.5 cc   Dysport research study has completed, she is now coming back for repeat injection, she noticed head bobbing over the past few weeks, she denies significant neck pain, weakness,  Her rheumatoid arthritis is under better control   Last injection was in Sep 2014, she did very well, no neck extension weakness, only rarely neck shaking  UPDATE March 8th 2016: She has lost follow-up since her last  visit December 2014, She came in for treatment her cervical dystonia, she feel the tension of her neck all the time, stiffness. Denied gait difficulty, last injection was September 2014, she received 500 units of Dysport, works well for her, she is continue on medication for anxiety, rheumatoid arthritis She also complains of nighttime snoring, frequent awakening, excessive daytime sleepiness, fatigue, today's ESS is 6, FSS was 19,  UPDATE October 13 2014:   She has missed her scheduled sleep study, continue have worsening neck posturing, posterior neck pain, excessive daytime fatigue and sleepiness  UPDATE Nov 11 2014: She came in for EMG guided Dysport injection today, last injection was December 2014, she complains of worsening head titubation, bilateral hands mild postural tremor, posterior neck pain,  Update February 17 2015 She responded very well to last EMG guided Dysport injection Nov 11 2014, no significant head titubation, no significant neck pain  Update July 22 2015: Last injection was August, now she noticed returning of neck pulling and shaking, posterior neck muscle achy pain  Update October 21 2015: Last EMG guided Dysport injection was in January, she responded very well, only recent couple weeks, she noticed returning of neck pulling, shaking again, She has worsening rheumatoid arthritis, is receiving more frequent treatment, complains fatigue, diffuse body achy pain  Update March 15 2016: She responded very well to previous Dysport injection in April, no significant side effect noticed, only recent few weeks, 5 months from previous injection, she noticed return of head shaking neck muscle tightness.  UPDATE Dec 14th 2017: She responded very well  to previous injection in Sept 2017, no significant side effect noticed.  Update September 13 2016: She did well this last injection in December  Update January 15 2017: She noticed worsening head tremor, she had excessive stress,  her sister passed away from lung cancer in April 2018,  UPDATE Apr 25 2017: She responded very well with previous injection, barely has noticeable head shaking.  Update August 01, 2017: She responded very well to previous dysport injection, only recently began to have recurrent head shaking  UPDATE December 19 2017: She responded well to previous injection  UPDATE Sept 25 2019: She responded very well to previous injection  REVIEW OF SYSTEMS: Full 14 system review of systems performed and notable only for ear pain, joint pain, joint swelling, achy muscles, muscle cramps, rash, tremors, depression, anxiety  ALLERGIES: No Known Allergies  HOME MEDICATIONS: Current Outpatient Medications  Medication Sig Dispense Refill  . acetaminophen (TYLENOL) 325 MG tablet Take 650 mg by mouth once. One hour prior to infusion    . baclofen (LIORESAL) 10 MG tablet Take 10 mg by mouth daily as needed for muscle spasms.     Marland Kitchen BIOTIN PO Take by mouth daily.    . Cetirizine HCl (ZYRTEC ALLERGY) 10 MG TBDP Take 10 mg by mouth at bedtime. 30 tablet 0  . clonazePAM (KLONOPIN) 0.5 MG tablet TAKE 1 TABLET BY MOUTH TWICE DAILY IF NEEDED FOR ANXIETY 60 tablet 5  . diphenhydrAMINE (BENADRYL) 25 mg capsule Take 25 mg by mouth once. One hour prior to infusion    . DULoxetine (CYMBALTA) 60 MG capsule TAKE ONE CAPSULE BY MOUTH DAILY 30 capsule 2  . DYSPORT 500 units SOLR injection INJECT 500 UNITS INTRAMUSCULARLY EVERY 3 MONTHS (GIVEN AT PRESCRIBERS OFFICE, DISCARD UNUSED AFTER 1ST USE) 1 each 3  . folic acid (FOLVITE) 1 MG tablet TAKE 1 TABLET(1 MG) BY MOUTH DAILY 90 tablet 4  . inFLIXimab (REMICADE) 100 MG injection Inject 6 mg/kg into the vein every 6 (six) weeks. Infuse 500 mg/ 5 vials in 262mL NS over 2hours every 6 weeks    . methotrexate 50 MG/2ML injection INJECT 0.3 ML INTO THE SKIN ONCE A WEEK 4 mL 0  . Multiple Vitamins-Minerals (MULTIVITAMINS THER. W/MINERALS) TABS Take 1 tablet by mouth daily.    Marland Kitchen  omeprazole (PRILOSEC) 20 MG capsule TAKE 1 CAPSULE BY MOUTH EVERY DAY 90 capsule 3  . traMADol (ULTRAM) 50 MG tablet Take 1 - 2 every 8 hours as needed for pain. 30 tablet 0  . TUBERCULIN SYR 1CC/27GX1/2" (B-D TB SYRINGE 1CC/27GX1/2") 27G X 1/2" 1 ML MISC use 1 TB syringe once weekly 4 each 11  . TURMERIC PO Take by mouth daily.    . Xylitol (XYLIMELTS MT) Use as directed in the mouth or throat.     No current facility-administered medications for this visit.     PAST MEDICAL HISTORY: Past Medical History:  Diagnosis Date  . Anxiety   . Cervical dystonia   . Depression   . Fibromyalgia   . GERD (gastroesophageal reflux disease)   . History of shingles   . RA (rheumatoid arthritis) ([place])   . Synovitis of hand     PAST SURGICAL HISTORY: Past Surgical History:  Procedure Laterality Date  . TONSILLECTOMY    . VAGINAL HYSTERECTOMY  3/08    FAMILY HISTORY: Family History  Problem Relation Age of Onset  . Arthritis Mother   . Heart Problems Father   . Heart disease Father   .  Heart disease Paternal Uncle        MI    SOCIAL HISTORY:  Social History   Socioeconomic History  . Marital status: Married    Spouse name: [name]  . Number of children: 2  . Years of education: 67  . Highest education level: Not on file  Occupational History    Employer: [employer]  . Financial resource strain: Not on file  . Food insecurity:    Worry: Not on file    Inability: Not on file  . Transportation needs:    Medical: Not on file    Non-medical: Not on file  Tobacco Use  . Smoking status: Former Smoker    Packs/day: 0.50    Years: 30.00    Pack years: 15.00    Types: Cigarettes    Last attempt to quit: 05/02/2015    Years since quitting: 3.4  . Smokeless tobacco: Never Used  Substance and Sexual Activity  . Alcohol use: Yes    Alcohol/week: 0.0 standard drinks    Comment: Rare  . Drug use: Never  . Sexual activity: Yes    Birth control/protection:  Surgical    Comment: 1st intercourse 58 yo-Fewer than 5 partners  Lifestyle  . Physical activity:    Days per week: Not on file    Minutes per session: Not on file  . Stress: Not on file  Relationships  . Social connections:    Talks on phone: Not on file    Gets together: Not on file    Attends religious service: Not on file    Active member of club or organization: Not on file    Attends meetings of clubs or organizations: Not on file    Relationship status: Not on file  . Intimate partner violence:    Fear of current or ex partner: Not on file    Emotionally abused: Not on file    Physically abused: Not on file    Forced sexual activity: Not on file  Other Topics Concern  . 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 Right handed.    Caffeine- 4 cups daily.     PHYSICAL EXAM   Vitals:   10/01/18 1320  BP: 138/86  Pulse: 92  Weight: 182 lb (82.6 kg)  Height: 5\' 6"  (1.676 m)    Not recorded      Body mass index is 29.38 kg/m.  PHYSICAL EXAMNIATION: MENTAL STATUS: Speech is normal, normal to casual conversation, and history taking  CRANIAL NERVES: mild retrocollis, right tilt, mild right lateral shift, slight left shoulder elevation  MOTOR: Normal tone and bulk and strength COORDINATION: No dysmetria   GAIT/STANCE: Posture is normal.  DIAGNOSTIC DATA (LABS, IMAGING, TESTING) - I reviewed patient records, labs, notes, testing and imaging myself where available.  ASSESSMENT AND PLAN  Courtney Myers is a 58 y.o. female with long-standing history of cervical dystonia, responded very well to previous EMG guided Dysport injection, last injection was May 2016, responded very well.   Mild retrocollis, right tilt, mild right lateral shift, slight left shoulder elevation  500 of Dysport was dissolved into 2.5 cc of normal saline,   Right longissimus capitus 0. 5 cc Left splenius capitis 0.5 cc Right inferior oblique capitis, 0.5 cc  Left inferior oblique capitis 0.5 cc Left levator scapular 0.5   Patient tolerate the  injection well, will return to clinic in 3 months for repeat injection Please dissolve 500 units of Dysport into 2.5 cc of normal saline   Courtney Myers, M.D. Ph.D.  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  Symptom onset was without apparent triggering event, she has gradually developed this neck pulling,  manifested primarily with right laterocollis, mild retrocolis, and left shoulder elevation since 2000. She has good relief with regular Botox injection, about 4 times a year since 2003. Last injection was February 18, 2010 by Dr. Marta Antu,  She reported great relief as usual, taking 4- 5 days to get symptom relieve, lasting about 3 months.  I began to inject her since 12.2011, every 3-4 months, dosage has been limited to 150 units of BOTOX A.  She denies neck pain, gait difficulty, but with wearing off Botox, she noticed increased head tremor, and pulling towards her right shoulder.  She developed  transient difficulty lifting her neck up when using BOTOX A 200 units previously, reponding well to decreased dose of 150 units  She was enrolled into IPSEN dysport study, first injection was in April 30th 2013, she later enrolled into open label, 2nd injection was in  May 28th 2013. She did very well.  Recent few weeks,  She had experienced flareup of her rheumatoid arthritis, has received IV infusion. Last injection was in 06/10/2012,  500 units of dysport was dissolved into 2 cc of normal saline. Right longissimus capitus 0.5 cc Right  splenius capitis 0.5 cc Right levator scapular 0.5 cc Right iliocostalis 0.5 cc   Dysport research study has completed, she is now coming back for repeat injection, she noticed head bobbing over the past few weeks, she denies significant neck pain, weakness,  Her rheumatoid arthritis is under better control   Last injection was in Sep 2014, she did very well, no neck extension weakness, only rarely neck shaking  UPDATE March 8th 2016: She has lost follow-up since her last visit December 2014, She came in for treatment her cervical dystonia, she feel the tension of her neck all the time, stiffness. Denied gait difficulty, last injection was September 2014, she received 500 units of Dysport, works well for her, she is continue on medication for anxiety, rheumatoid arthritis She also complains of nighttime snoring, frequent awakening, excessive daytime sleepiness, fatigue, today's ESS is 6, FSS was 47,  UPDATE October 13 2014:   She has missed her scheduled sleep study, continue have worsening neck posturing, posterior neck pain, excessive daytime fatigue and sleepiness  UPDATE Nov 11 2014: She came in for EMG guided Dysport injection today, last injection was December 2014, she complains of worsening head titubation, bilateral hands mild postural tremor, posterior neck pain,  Update February 17 2015 She responded very well to last EMG guided Dysport injection Nov 11 2014, no significant head titubation, no significant neck pain  Update July 22 2015: Last injection was August, now she noticed returning of neck pulling and shaking, posterior neck muscle achy pain  Update October 21 2015: Last EMG guided Dysport injection was in January, she responded very well, only recent couple weeks, she noticed returning of neck pulling, shaking again, She has worsening rheumatoid arthritis, is receiving more frequent treatment, complains fatigue, diffuse  body achy pain  Update March 15 2016: She  responded very well to previous Dysport injection in April, no significant side effect noticed, only recent few weeks, 5 months from previous injection, she noticed return of head shaking neck muscle tightness.  UPDATE Dec 14th 2017: She responded very well to previous injection in Sept 2017, no significant side effect noticed.  Update September 13 2016: She did well this last injection in December  Update January 15 2017: She noticed worsening head tremor, she had excessive stress, her sister passed away from lung cancer in April 2018,  UPDATE Apr 25 2017: She responded very well with previous injection, barely has noticeable head shaking.  Update August 01, 2017: She responded very well to previous dysport injection, only recently began to have recurrent head shaking  UPDATE December 19 2017: She responded well to previous injection  UPDATE Jun 20 2018: She did very well with previous injection  UPDATE October 01 2018: She did well to previous injection  REVIEW OF SYSTEMS: Full 14 system review of systems performed and notable only for as above.  All rest review of system were negative.  ALLERGIES: No Known Allergies  HOME MEDICATIONS: Current Outpatient Medications  Medication Sig Dispense Refill  . acetaminophen (TYLENOL) 325 MG tablet Take 650 mg by mouth once. One hour prior to infusion    . baclofen (LIORESAL) 10 MG tablet Take 10 mg by mouth daily as needed for muscle spasms.     Marland Kitchen BIOTIN PO Take by mouth daily.    . Cetirizine HCl (ZYRTEC ALLERGY) 10 MG TBDP Take 10 mg by mouth at bedtime. 30 tablet 0  . clonazePAM (KLONOPIN) 0.5 MG tablet TAKE 1 TABLET BY MOUTH TWICE DAILY IF NEEDED FOR ANXIETY 60 tablet 5  . diphenhydrAMINE (BENADRYL) 25 mg capsule Take 25 mg by mouth once. One hour prior to infusion    . DULoxetine (CYMBALTA) 60 MG capsule TAKE ONE CAPSULE BY MOUTH DAILY 30 capsule 2  . DYSPORT 500 units SOLR injection INJECT 500 UNITS INTRAMUSCULARLY EVERY 3 MONTHS (GIVEN  AT PRESCRIBERS OFFICE, DISCARD UNUSED AFTER 1ST USE) 1 each 3  . folic acid (FOLVITE) 1 MG tablet TAKE 1 TABLET(1 MG) BY MOUTH DAILY 90 tablet 4  . inFLIXimab (REMICADE) 100 MG injection Inject 6 mg/kg into the vein every 6 (six) weeks. Infuse 500 mg/ 5 vials in 246mL NS over 2hours every 6 weeks    . methotrexate 50 MG/2ML injection INJECT 0.3 ML INTO THE SKIN ONCE A WEEK 4 mL 0  . Multiple Vitamins-Minerals (MULTIVITAMINS THER. W/MINERALS) TABS Take 1 tablet by mouth daily.    Marland Kitchen omeprazole (PRILOSEC) 20 MG capsule TAKE 1 CAPSULE BY MOUTH EVERY DAY 90 capsule 3  . traMADol (ULTRAM) 50 MG tablet Take 1 - 2 every 8 hours as needed for pain. 30 tablet 0  . TUBERCULIN SYR 1CC/27GX1/2" (B-D TB SYRINGE 1CC/27GX1/2") 27G X 1/2" 1 ML MISC use 1 TB syringe once weekly 4 each 11  . TURMERIC PO Take by mouth daily.    . Xylitol (XYLIMELTS MT) Use as directed in the mouth or throat.     No current facility-administered medications for this visit.     PAST MEDICAL HISTORY: Past Medical History:  Diagnosis Date  . Anxiety   . Cervical dystonia   . Depression   . Fibromyalgia   . GERD (gastroesophageal reflux disease)   . History of shingles   . RA (rheumatoid arthritis) ([place])   . Synovitis of  hand     PAST SURGICAL HISTORY: Past Surgical History:  Procedure Laterality Date  . TONSILLECTOMY    . VAGINAL HYSTERECTOMY  3/08    FAMILY HISTORY: Family History  Problem Relation Age of Onset  . Arthritis Mother   . Heart Problems Father   . Heart disease Father   . Heart disease Paternal Uncle        MI    SOCIAL HISTORY:  Social History   Socioeconomic History  . Marital status: Married    Spouse name: [name]  . Number of children: 2  . Years of education: 60  . Highest education level: Not on file  Occupational History    Employer: [employer]  . Financial resource strain: Not on file  . Food insecurity:    Worry: Not on file    Inability: Not on file  .  Transportation needs:    Medical: Not on file    Non-medical: Not on file  Tobacco Use  . Smoking status: Former Smoker    Packs/day: 0.50    Years: 30.00    Pack years: 15.00    Types: Cigarettes    Last attempt to quit: 05/02/2015    Years since quitting: 3.4  . Smokeless tobacco: Never Used  Substance and Sexual Activity  . Alcohol use: Yes    Alcohol/week: 0.0 standard drinks    Comment: Rare  . Drug use: Never  . Sexual activity: Yes    Birth control/protection: Surgical    Comment: 1st intercourse 58 yo-Fewer than 5 partners  Lifestyle  . Physical activity:    Days per week: Not on file    Minutes per session: Not on file  . Stress: Not on file  Relationships  . Social connections:    Talks on phone: Not on file    Gets together: Not on file    Attends religious service: Not on file    Active member of club or organization: Not on file    Attends meetings of clubs or organizations: Not on file    Relationship status: Not on file  . Intimate partner violence:    Fear of current or ex partner: Not on file    Emotionally abused: Not on file    Physically abused: Not on file    Forced sexual activity: Not on file  Other Topics Concern  . 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 Right handed.    Caffeine- 4 cups daily.     PHYSICAL EXAM   Vitals:   10/01/18 1320  BP: 138/86  Pulse: 92  Weight: 182 lb (82.6 kg)  Height: 5\' 6"  (1.676 m)    Not recorded      Body mass index is 29.38 kg/m.  PHYSICAL EXAMNIATION: MENTAL STATUS: Speech is normal, normal to casual conversation, and history taking   mild retrocollis, right tilt, mild right lateral shift, slight left shoulder elevation  DIAGNOSTIC DATA (LABS, IMAGING, TESTING) - I reviewed patient records, labs, notes, testing and imaging myself where available.  ASSESSMENT AND PLAN   Courtney Myers is a 58 y.o. female with long-standing history of cervical dystonia, responded very well to previous EMG guided Dysport injection, last injection was May 2016, responded very well.   Mild retrocollis, right tilt, mild right lateral shift, slight left shoulder elevation  500 of Dysport was dissolved  into 2.5 cc of normal saline,  Right longissimus capitus 0. 5 cc Rightsplenius capitis 0.5 cc Right inferior oblique capitis, 0.5 cc  Left inferior oblique capitis 0.5 cc Left levator scapular 0.5   Patient tolerate the injection well, will return to clinic in 3 months for repeat injection Please dissolve 500 units of Dysport into 2.5 cc of normal saline   Courtney Myers, M.D. Ph.D.  [facility] [address], [place], [place] [address] Ph: [phone] Fax: [phone]

## 2018-10-01 NOTE — Progress Notes (Signed)
**  Dysport 500 units x 1 vial, [place] [address]-5, Lot [id number], Exp 05/03/2019, specialty pharmacy.//mck,rn**

## 2018-10-23 ENCOUNTER — Other Ambulatory Visit: Payer: Self-pay | Admitting: Rheumatology

## 2018-10-23 NOTE — Telephone Encounter (Signed)
Last Visit: 08/15/2018 Next Visit: 01/10/2019 Labs: 08/08/2018 Glucose is 103. All other labs are WNL.  Okay to refill per Dr. Estanislado Pandy.

## 2018-10-24 ENCOUNTER — Other Ambulatory Visit: Payer: Self-pay | Admitting: *Deleted

## 2018-10-24 NOTE — Progress Notes (Signed)
Infusion orders are current for patient CBC CMP Tylenol Benadryl appointments are up to date and follow up appointment  is scheduled TB gold not due yet.  

## 2018-10-28 ENCOUNTER — Encounter: Payer: 59 | Admitting: Physician Assistant

## 2018-10-28 ENCOUNTER — Encounter: Payer: 59 | Admitting: Family Medicine

## 2018-10-31 ENCOUNTER — Ambulatory Visit (HOSPITAL_COMMUNITY)
Admission: RE | Admit: 2018-10-31 | Discharge: 2018-10-31 | Disposition: A | Discharge status: Home / Self Care | Payer: 59 | Source: Ambulatory Visit | Attending: Rheumatology | Admitting: Rheumatology

## 2018-10-31 ENCOUNTER — Other Ambulatory Visit: Payer: Self-pay

## 2018-10-31 DIAGNOSIS — M0609 Rheumatoid arthritis without rheumatoid factor, multiple sites: Secondary | ICD-10-CM | POA: Insufficient documentation

## 2018-10-31 LAB — COMPREHENSIVE METABOLIC PANEL
ALT: 25 U/L (ref 0–44)
AST: 27 U/L (ref 15–41)
Albumin: 3.8 g/dL (ref 3.5–5.0)
Alkaline Phosphatase: 83 U/L (ref 38–126)
Anion gap: 9 (ref 5–15)
BUN: 11 mg/dL (ref 6–20)
CO2: 24 mmol/L (ref 22–32)
Calcium: 9.4 mg/dL (ref 8.9–10.3)
Chloride: 102 mmol/L (ref 98–111)
Creatinine, Ser: 0.96 mg/dL (ref 0.44–1.00)
GFR calc Af Amer: >60 mL/min (ref >60)
GFR calc non Af Amer: >60 mL/min (ref >60)
Glucose, Bld: 107 mg/dL — ABNORMAL HIGH (ref 70–99)
Potassium: 4.2 mmol/L (ref 3.5–5.1)
Sodium: 135 mmol/L (ref 135–145)
Total Bilirubin: 0.5 mg/dL (ref 0.3–1.2)
Total Protein: 7.1 g/dL (ref 6.5–8.1)

## 2018-10-31 LAB — CBC
HCT: 39.8 % (ref 36.0–46.0)
Hemoglobin: 13.4 g/dL (ref 12.0–15.0)
MCH: 30.7 pg (ref 26.0–34.0)
MCHC: 33.7 g/dL (ref 30.0–36.0)
MCV: 91.1 fL (ref 80.0–100.0)
Platelets: 228 10*3/uL (ref 150–400)
RBC: 4.37 MIL/uL (ref 3.87–5.11)
RDW: 13.6 % (ref 11.5–15.5)
WBC: 8.1 10*3/uL (ref 4.0–10.5)
nRBC: 0 % (ref 0.0–0.2)

## 2018-10-31 MED ORDER — ACETAMINOPHEN 325 MG PO TABS: 650.0000 mg | ORAL_TABLET | ORAL | Status: DC

## 2018-10-31 MED ORDER — SODIUM CHLORIDE 0.9 % IV SOLN
6.0000 mg/kg | INTRAVENOUS | Status: DC
  Administered 2018-10-31: 500 mg via INTRAVENOUS
  Filled 2018-10-31: qty 50

## 2018-10-31 MED ORDER — DIPHENHYDRAMINE HCL 25 MG PO CAPS: 25.0000 mg | ORAL_CAPSULE | ORAL | Status: DC

## 2018-10-31 NOTE — Progress Notes (Signed)
CBC normal

## 2018-10-31 NOTE — Progress Notes (Signed)
WNL

## 2018-11-14 ENCOUNTER — Encounter: Payer: Self-pay | Admitting: Family Medicine

## 2018-11-15 ENCOUNTER — Telehealth: Payer: Self-pay | Admitting: Rheumatology

## 2018-11-15 NOTE — Telephone Encounter (Signed)
Patient states she was exposed to [place] at work. Patient advised per Dr. Estanislado Pandy if her RA is well controlled and she is not having symptoms she can hold MTX. Patient states she will hold MTX until her results. Patient states she has spoken with her HR department and they will not allow her to come back to work until after she has received her results.Advised patient it is typical to have self isolation for 14 days after possible exposure.

## 2018-11-15 NOTE — Telephone Encounter (Signed)
Patient left a voicemail stating she has possibly been exposed to COVID-19 at work.  Patient states she had swab test and will find out the results on 11/21/18.   Patient states she is due to take her Methotrexate today and is requesting a return call.

## 2018-11-17 ENCOUNTER — Other Ambulatory Visit: Payer: Self-pay | Admitting: Rheumatology

## 2018-11-18 NOTE — Telephone Encounter (Signed)
Last Visit: 08/15/2018 Next Visit: 01/10/2019  Okay to refill per Dr. Estanislado Pandy

## 2018-12-09 ENCOUNTER — Other Ambulatory Visit: Payer: Self-pay | Admitting: *Deleted

## 2018-12-09 NOTE — Progress Notes (Signed)
Infusion orders are current for patient CBC CMP Tylenol Benadryl appointments are up to date and follow up appointment  is scheduled TB gold not due yet.  

## 2018-12-12 ENCOUNTER — Other Ambulatory Visit: Payer: Self-pay

## 2018-12-12 ENCOUNTER — Ambulatory Visit (HOSPITAL_COMMUNITY)
Admission: RE | Admit: 2018-12-12 | Discharge: 2018-12-12 | Disposition: A | Discharge status: Home / Self Care | Payer: 59 | Source: Ambulatory Visit | Attending: Rheumatology | Admitting: Rheumatology

## 2018-12-12 DIAGNOSIS — M0609 Rheumatoid arthritis without rheumatoid factor, multiple sites: Secondary | ICD-10-CM | POA: Insufficient documentation

## 2018-12-12 LAB — CBC
HCT: 36.8 % (ref 36.0–46.0)
Hemoglobin: 12 g/dL (ref 12.0–15.0)
MCH: 29.9 pg (ref 26.0–34.0)
MCHC: 32.6 g/dL (ref 30.0–36.0)
MCV: 91.8 fL (ref 80.0–100.0)
Platelets: 220 10*3/uL (ref 150–400)
RBC: 4.01 MIL/uL (ref 3.87–5.11)
RDW: 13.8 % (ref 11.5–15.5)
WBC: 7.6 10*3/uL (ref 4.0–10.5)
nRBC: 0 % (ref 0.0–0.2)

## 2018-12-12 LAB — COMPREHENSIVE METABOLIC PANEL
ALT: 20 U/L (ref 0–44)
AST: 21 U/L (ref 15–41)
Albumin: 3.3 g/dL — ABNORMAL LOW (ref 3.5–5.0)
Alkaline Phosphatase: 68 U/L (ref 38–126)
Anion gap: 8 (ref 5–15)
BUN: 8 mg/dL (ref 6–20)
CO2: 25 mmol/L (ref 22–32)
Calcium: 8.9 mg/dL (ref 8.9–10.3)
Chloride: 106 mmol/L (ref 98–111)
Creatinine, Ser: 0.87 mg/dL (ref 0.44–1.00)
GFR calc Af Amer: >60 mL/min (ref >60)
GFR calc non Af Amer: >60 mL/min (ref >60)
Glucose, Bld: 124 mg/dL — ABNORMAL HIGH (ref 70–99)
Potassium: 4.2 mmol/L (ref 3.5–5.1)
Sodium: 139 mmol/L (ref 135–145)
Total Bilirubin: 0.5 mg/dL (ref 0.3–1.2)
Total Protein: 6.4 g/dL — ABNORMAL LOW (ref 6.5–8.1)

## 2018-12-12 MED ORDER — DIPHENHYDRAMINE HCL 25 MG PO CAPS: 25.0000 mg | ORAL_CAPSULE | ORAL | Status: DC

## 2018-12-12 MED ORDER — SODIUM CHLORIDE 0.9 % IV SOLN
6.0000 mg/kg | INTRAVENOUS | Status: DC
  Administered 2018-12-12: 500 mg via INTRAVENOUS
  Filled 2018-12-12: qty 50

## 2018-12-12 MED ORDER — ACETAMINOPHEN 325 MG PO TABS: 650.0000 mg | ORAL_TABLET | ORAL | Status: DC

## 2018-12-17 ENCOUNTER — Encounter: Payer: 59 | Admitting: Family Medicine

## 2018-12-18 ENCOUNTER — Ambulatory Visit: Payer: Self-pay | Admitting: Neurology

## 2018-12-19 ENCOUNTER — Other Ambulatory Visit: Payer: Self-pay | Admitting: Rheumatology

## 2018-12-19 NOTE — Telephone Encounter (Signed)
Last Visit:08/15/2018 Next Visit:01/10/2019  Okay to refill per Dr. Estanislado Pandy

## 2018-12-27 NOTE — Progress Notes (Signed)
Office Visit Note  Patient: Courtney Myers             Date of Birth: 02-Jan-1961           MRN: [id number]             PCP: Susy Frizzle, MD Referring: Susy Frizzle, MD Visit Date: 01/10/2019 Occupation: @GUAROCC @  Subjective:  Eye and mouth dryness    History of Present Illness: Courtney Myers is a 58 y.o. female with history of seronegative rheumatoid arthritis and fibromyalgia.  She is receiving Remicade IV infusions every 6 weeks, MTX 0.3 ml sq once weekly, and folic acid 1 mg po daily.  Her last Remicade infusion was on 12/12/18. She has not missing any doses.  No recent infections.  She has occasional pain and swelling in the right wrist and in both hands.  She uses Voltaren gel as needed for pain relief. She continues to have chronic fatigue. She continues to sleep well and get good rest. She has been walking for exercise. She reports she has a fibromyalgia flare 1-2 days per month.  She has been experiencing increased eye dryness, mouth dryness, and left ear fullness.  She was evaluated by an ENT and she reports there was no signs of fluid or infection.    Activities of Daily Living:  Patient reports morning stiffness for 15 minutes.   Patient Denies nocturnal pain.  Difficulty dressing/grooming: Denies Difficulty climbing stairs: Denies Difficulty getting out of chair: Reports Difficulty using hands for taps, buttons, cutlery, and/or writing: Denies  Review of Systems  Constitutional: Positive for fatigue.  HENT: Positive for mouth sores, mouth dryness and nose dryness.   Eyes: Positive for dryness. Negative for itching.  Respiratory: Negative for shortness of breath and difficulty breathing.   Cardiovascular: Negative for palpitations and swelling in legs/feet.  Gastrointestinal: Negative for abdominal pain, constipation and diarrhea.  Endocrine: Negative for increased urination.  Genitourinary: Negative for painful urination and pelvic pain.   Musculoskeletal: Positive for arthralgias, joint pain, joint swelling and morning stiffness.  Skin: Negative for rash and redness.  Allergic/Immunologic: Negative for susceptible to infections.  Neurological: Positive for weakness. Negative for dizziness, headaches and memory loss.  Hematological: Negative for bruising/bleeding tendency.  Psychiatric/Behavioral: Negative for confusion. The patient is not nervous/anxious.     PMFS History:  Patient Active Problem List   Diagnosis Date Noted  . Excessive sleepiness 09/08/2014  . Fibromyalgia syndrome 08/25/2014  . RA (rheumatoid arthritis) ([place])   . GERD (gastroesophageal reflux disease)   . Anxiety   . Depression   . History of shingles   . Synovitis of hand   . Cervical dystonia 12/04/2012    Past Medical History:  Diagnosis Date  . Anxiety   . Cervical dystonia   . Depression   . Fibromyalgia   . GERD (gastroesophageal reflux disease)   . History of shingles   . RA (rheumatoid arthritis) ([place])   . Synovitis of hand     Family History  Problem Relation Age of Onset  . Arthritis Mother   . Heart Problems Father   . Heart disease Father   . Heart disease Paternal Uncle        MI   Past Surgical History:  Procedure Laterality Date  . TONSILLECTOMY    . 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 Right handed.    Caffeine- 4 cups daily.   Immunization History  Administered Date(s) Administered  . Influenza,inj,Quad PF,6+ Mos 05/25/2018  . Influenza-Unspecified 06/24/2014, 05/01/2015, 04/10/2016, 04/06/2017  . Pneumococcal Conjugate-13 05/01/2015  . Pneumococcal Polysaccharide-23 04/10/2016  . Tdap 09/09/2015     Objective: Vital Signs: BP 116/73 (BP Location: Left Arm, Patient Position: Sitting, Cuff Size: Normal)   Pulse 78   Resp 13   Ht 5\' 6"  (1.676 m)    Wt 182 lb (82.6 kg)   BMI 29.38 kg/m    Physical Exam Vitals signs and nursing note reviewed.  Constitutional:      Appearance: She is well-developed.  HENT:     Head: Normocephalic and atraumatic.  Eyes:     Conjunctiva/sclera: Conjunctivae normal.  Neck:     Musculoskeletal: Normal range of motion.  Cardiovascular:     Rate and Rhythm: Normal rate and regular rhythm.     Heart sounds: Normal heart sounds.  Pulmonary:     Effort: Pulmonary effort is normal.     Breath sounds: Normal breath sounds.  Abdominal:     General: Bowel sounds are normal.     Palpations: Abdomen is soft.  Lymphadenopathy:     Cervical: No cervical adenopathy.  Skin:    General: Skin is warm and dry.     Capillary Refill: Capillary refill takes less than 2 seconds.  Neurological:     Mental Status: She is alert and oriented to person, place, and time.  Psychiatric:        Behavior: Behavior normal.      Musculoskeletal Exam: C-spine, thoracic spine, and lumbar spine good ROM.  No midline spinal tenderness.  No SI joint tenderness.  Shoulder joints, elbow joints, wrist joints, MCPs, PIPs, and DIPs good ROM with no synovitis.  Hip joints, knee joints, MTPs, PIPs, and DIPs good ROM with no synovitis.  No warmth or effusion of knee joints.  No tenderness or swelling of ankle joints.    CDAI Exam: CDAI Score: - Patient Global: -; Provider Global: - Swollen: -; Tender: - Joint Exam   No joint exam has been documented for this visit   There is currently no information documented on the homunculus. Go to the Rheumatology activity and complete the homunculus joint exam.  Investigation: No additional findings.  Imaging: No results found.  Recent Labs: Lab Results  Component Value Date   WBC 7.6 12/12/2018   HGB 12.0 12/12/2018   PLT 220 12/12/2018   NA 139 12/12/2018   K 4.2 12/12/2018   CL 106 12/12/2018   CO2 25 12/12/2018   GLUCOSE 124 (H) 12/12/2018   BUN 8 12/12/2018   CREATININE  0.87 12/12/2018   BILITOT 0.5 12/12/2018   ALKPHOS 68 12/12/2018   AST 21 12/12/2018   ALT 20 12/12/2018   PROT 6.4 (L) 12/12/2018   ALBUMIN 3.3 (L) 12/12/2018   CALCIUM 8.9 12/12/2018   GFRAA >60 12/12/2018   QFTBGOLD Negative 09/14/2015   QFTBGOLDPLUS Negative 02/14/2018    Speciality Comments: Remicade 6mg /kg every 6 weeks  TB gold negative 10/27/16  Procedures:  No procedures performed Allergies: Patient has no known allergies.    Assessment / Plan:     Visit Diagnoses: Rheumatoid arthritis of multiple sites with negative rheumatoid factor ([place]) - She has no synovitis or tenderness on exam. She has not had any recent rheumatoid arthritis flares.  She has intermittent pain and swelling in the right wrist joint,  dependent on her level of activity.  She has no tenderness or inflammation at this time.  She has occasional pain in both hands but she is able to make a complete fist without any difficulty.  She is clinically doing well on Remicade 6 mg/kg every 6 weeks, MTX 0.3 ml sq once weekly, and folic acid 1 mg po daily.  She does not experiencing breakthrough pain or stiffness prior to her infusions.  She has not missed any doses recently.  She will continue on this current treatment regimen.  She was advised to notify us if she develops increased joint pain or joint swelling.  She will follow up in 5 months.   High risk medication use - Remicade infusion 6 mg/kg every 6 weeks (last infusion 12/12/2018), methotrexate 0.3 mL every 7 days, and folic acid 1 mg 1 tablet daily.  Last TB gold negative on 02/14/2018 and will monitor yearly.  Most recent CBC/CMP stable on 12/12/2018 and will monitor with every other infusion.  She received the flu vaccine in November and is up-to-date with pneumonia vaccines.   - Plan: QuantiFERON-TB Gold Plus  Fibromyalgia - She has generalized muscle aches and tenderness.  She continues to have fibromyalgia flares 1-2 days per month. She has FMLA but rarely has to  miss work. She has chronic fatigue but has been sleeping well at night.  She has been trying to walk for exercise.  She was encouraged to stay active and exercise on a regular basis.    Chronic fatigue - She continues to have chronic fatigue.  Primary insomnia: She has been sleeping well at night.   Mouth dryness -She has mouth dryness.  She has been using xylimelts. We will check a Ro and La ab with her next lab work.  Plan: Sjogrens syndrome-B extractable nuclear antibody, Sjogrens syndrome-A extractable nuclear antibody  Eye dryness - She has chronic eye dryness.  We will order future orders for Ro and La. Plan: Sjogrens syndrome-B extractable nuclear antibody, Sjogrens syndrome-A extractable nuclear antibody  Other medical conditions are listed as follows:   History of gastroesophageal reflux (GERD)   Anxiety and depression     Orders: Orders Placed This Encounter  Procedures  . Sjogrens syndrome-B extractable nuclear antibody  . Sjogrens syndrome-A extractable nuclear antibody  . QuantiFERON-TB Gold Plus   No orders of the defined types were placed in this encounter.     Follow-Up Instructions: Return in about 5 months (around 06/12/2019) for Rheumatoid arthritis, Fibromyalgia.   Ofilia Neas, PA-C  Note - This record has been created using Dragon software.  Chart creation errors have been sought, but may not always  have been located. Such creation errors do not reflect on  the standard of medical care.

## 2019-01-01 ENCOUNTER — Other Ambulatory Visit: Payer: Self-pay

## 2019-01-01 ENCOUNTER — Ambulatory Visit: Payer: 59 | Admitting: Neurology

## 2019-01-01 ENCOUNTER — Encounter: Payer: Self-pay | Admitting: Neurology

## 2019-01-01 VITALS — BP 122/78 | HR 68 | Temp 98.2°F | Ht 66.0 in | Wt 181.0 lb

## 2019-01-01 DIAGNOSIS — G243 Spasmodic torticollis: Secondary | ICD-10-CM | POA: Diagnosis not present

## 2019-01-01 NOTE — Progress Notes (Signed)
**  Dysport 500 units x 1 vial, NDC [address]-2, Lot [id number], Exp 08/03/2019, specialty pharmacy.//mck,rn**

## 2019-01-01 NOTE — Progress Notes (Signed)
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  Symptom onset was without apparent triggering event, she has gradually developed this neck pulling,  manifested primarily with right laterocollis, mild retrocolis, and left shoulder elevation since 2000. She has good relief with regular Botox injection, about 4 times a year since 2003. Last injection was February 18, 2010 by Dr. Marta Antu,  She reported great relief as usual, taking 4- 5 days to get symptom relieve, lasting about 3 months.  I began to inject her since 12.2011, every 3-4 months, dosage has been limited to 150 units of BOTOX A.  She denies neck pain, gait difficulty, but with wearing off Botox, she noticed increased head tremor, and pulling towards her right shoulder.  She developed  transient difficulty lifting her neck up when using BOTOX A 200 units previously, reponding well to decreased dose of 150 units  She was enrolled into IPSEN dysport study, first injection was in April 30th 2013, she later enrolled into open label, 2nd injection was in  May 28th 2013. She did very well.  Recent few weeks,  She had experienced flareup of her rheumatoid arthritis, has received IV infusion. Last injection was in 06/10/2012,  500 units of dysport was dissolved into 2 cc of normal saline. Right longissimus capitus 0.5 cc Right splenius capitis 0.5 cc Right levator scapular 0.5 cc Right iliocostalis 0.5 cc   Dysport research study has completed, she is now coming back for repeat injection, she noticed head bobbing over the past few weeks, she denies significant neck pain, weakness,  Her rheumatoid arthritis is under better control   Last injection was in Sep 2014, she did very well, no neck extension weakness, only rarely neck shaking  UPDATE March 8th 2016: She has lost follow-up since her last  visit December 2014, She came in for treatment her cervical dystonia, she feel the tension of her neck all the time, stiffness. Denied gait difficulty, last injection was September 2014, she received 500 units of Dysport, works well for her, she is continue on medication for anxiety, rheumatoid arthritis She also complains of nighttime snoring, frequent awakening, excessive daytime sleepiness, fatigue, today's ESS is 6, FSS was 19,  UPDATE October 13 2014:   She has missed her scheduled sleep study, continue have worsening neck posturing, posterior neck pain, excessive daytime fatigue and sleepiness  UPDATE Nov 11 2014: She came in for EMG guided Dysport injection today, last injection was December 2014, she complains of worsening head titubation, bilateral hands mild postural tremor, posterior neck pain,  Update February 17 2015 She responded very well to last EMG guided Dysport injection Nov 11 2014, no significant head titubation, no significant neck pain  Update July 22 2015: Last injection was August, now she noticed returning of neck pulling and shaking, posterior neck muscle achy pain  Update October 21 2015: Last EMG guided Dysport injection was in January, she responded very well, only recent couple weeks, she noticed returning of neck pulling, shaking again, She has worsening rheumatoid arthritis, is receiving more frequent treatment, complains fatigue, diffuse body achy pain  Update March 15 2016: She responded very well to previous Dysport injection in April, no significant side effect noticed, only recent few weeks, 5 months from previous injection, she noticed return of head shaking neck muscle tightness.  UPDATE Dec 14th 2017: She responded very well  to previous injection in Sept 2017, no significant side effect noticed.  Update September 13 2016: She did well this last injection in December  Update January 15 2017: She noticed worsening head tremor, she had excessive stress,  her sister passed away from lung cancer in April 2018,  UPDATE Apr 25 2017: She responded very well with previous injection, barely has noticeable head shaking.  Update August 01, 2017: She responded very well to previous dysport injection, only recently began to have recurrent head shaking  UPDATE December 19 2017: She responded well to previous injection  UPDATE Sept 25 2019: She responded very well to previous injection  REVIEW OF SYSTEMS: Full 14 system review of systems performed and notable only for ear pain, joint pain, joint swelling, achy muscles, muscle cramps, rash, tremors, depression, anxiety  ALLERGIES: No Known Allergies  HOME MEDICATIONS: Current Outpatient Medications  Medication Sig Dispense Refill  . acetaminophen (TYLENOL) 325 MG tablet Take 650 mg by mouth once. One hour prior to infusion    . baclofen (LIORESAL) 10 MG tablet Take 10 mg by mouth daily as needed for muscle spasms.     Marland Kitchen BIOTIN PO Take by mouth daily.    . Cetirizine HCl (ZYRTEC ALLERGY) 10 MG TBDP Take 10 mg by mouth at bedtime. 30 tablet 0  . clonazePAM (KLONOPIN) 0.5 MG tablet TAKE 1 TABLET BY MOUTH TWICE DAILY IF NEEDED FOR ANXIETY 60 tablet 5  . diphenhydrAMINE (BENADRYL) 25 mg capsule Take 25 mg by mouth once. One hour prior to infusion    . DULoxetine (CYMBALTA) 60 MG capsule TAKE 1 CAPSULE BY MOUTH DAILY 30 capsule 2  . DYSPORT 500 units SOLR injection INJECT 500 UNITS INTRAMUSCULARLY EVERY 3 MONTHS (GIVEN AT PRESCRIBERS OFFICE, DISCARD UNUSED AFTER 1ST USE) 1 each 3  . folic acid (FOLVITE) 1 MG tablet TAKE 1 TABLET(1 MG) BY MOUTH DAILY 90 tablet 4  . inFLIXimab (REMICADE) 100 MG injection Inject 6 mg/kg into the vein every 6 (six) weeks. Infuse 500 mg/ 5 vials in 277mL NS over 2hours every 6 weeks    . methotrexate 50 MG/2ML injection INJECT 0.3ML INTO SKIN ONCE A WEEK 4 mL 0  . Multiple Vitamins-Minerals (MULTIVITAMINS THER. W/MINERALS) TABS Take 1 tablet by mouth daily.    Marland Kitchen omeprazole  (PRILOSEC) 20 MG capsule TAKE 1 CAPSULE BY MOUTH EVERY DAY 90 capsule 3  . traMADol (ULTRAM) 50 MG tablet Take 1 - 2 every 8 hours as needed for pain. 30 tablet 0  . TUBERCULIN SYR 1CC/27GX1/2" (B-D TB SYRINGE 1CC/27GX1/2") 27G X 1/2" 1 ML MISC USE ONE SYRINGE ONCE WEEKLY AS DIRECTED 4 each 11  . TURMERIC PO Take by mouth daily.    . Xylitol (XYLIMELTS MT) Use as directed in the mouth or throat.     No current facility-administered medications for this visit.     PAST MEDICAL HISTORY: Past Medical History:  Diagnosis Date  . Anxiety   . Cervical dystonia   . Depression   . Fibromyalgia   . GERD (gastroesophageal reflux disease)   . History of shingles   . RA (rheumatoid arthritis) ([place])   . Synovitis of hand     PAST SURGICAL HISTORY: Past Surgical History:  Procedure Laterality Date  . TONSILLECTOMY    . VAGINAL HYSTERECTOMY  3/08    FAMILY HISTORY: Family History  Problem Relation Age of Onset  . Arthritis Mother   . Heart Problems Father   . Heart disease Father   .  Heart disease Paternal Uncle        MI    SOCIAL HISTORY:  Social History   Socioeconomic History  . Marital status: Married    Spouse name: [name]  . Number of children: 2  . Years of education: 54  . Highest education level: Not on file  Occupational History    Employer: [employer]  . Financial resource strain: Not on file  . Food insecurity    Worry: Not on file    Inability: Not on file  . Transportation needs    Medical: Not on file    Non-medical: Not on file  Tobacco Use  . Smoking status: Former Smoker    Packs/day: 0.50    Years: 30.00    Pack years: 15.00    Types: Cigarettes    Quit date: 05/02/2015    Years since quitting: 3.6  . Smokeless tobacco: Never Used  Substance and Sexual Activity  . Alcohol use: Yes    Alcohol/week: 0.0 standard drinks    Comment: Rare  . Drug use: Never  . Sexual activity: Yes    Birth control/protection: Surgical     Comment: 1st intercourse 58 yo-Fewer than 5 partners  Lifestyle  . Physical activity    Days per week: Not on file    Minutes per session: Not on file  . Stress: Not on file  Relationships  . Social Herbalist on phone: Not on file    Gets together: Not on file    Attends religious service: Not on file    Active member of club or organization: Not on file    Attends meetings of clubs or organizations: Not on file    Relationship status: Not on file  . Intimate partner violence    Fear of current or ex partner: Not on file    Emotionally abused: Not on file    Physically abused: Not on file    Forced sexual activity: Not on file  Other Topics Concern  . 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 Right handed.    Caffeine- 4 cups daily.     PHYSICAL EXAM   Vitals:   01/01/19 1339  BP: 122/78  Pulse: 68  Temp: 98.2 F (36.8 C)  Weight: 181 lb (82.1 kg)  Height: 5\' 6"  (1.676 m)    Not recorded      Body mass index is 29.21 kg/m.  PHYSICAL EXAMNIATION: MENTAL STATUS: Speech is normal, normal to casual conversation, and history taking  CRANIAL NERVES: mild retrocollis, right tilt, mild right lateral shift, slight left shoulder elevation  MOTOR: Normal tone and bulk and strength COORDINATION: No dysmetria   GAIT/STANCE: Posture is normal.  DIAGNOSTIC DATA (LABS, IMAGING, TESTING) - I reviewed patient records, labs, notes, testing and imaging myself where available.  ASSESSMENT AND PLAN  Courtney Myers is a 58 y.o. female with long-standing history of cervical dystonia, responded very well to previous EMG guided Dysport injection, last injection was May 2016, responded very well.   Mild retrocollis, right tilt, mild right lateral shift, slight left shoulder elevation  500 of Dysport was dissolved into 2.5 cc of normal  saline,  Right longissimus capitus 0. 5 cc Left splenius capitis 0.5 cc Right inferior oblique capitis, 0.5 cc  Left inferior oblique capitis 0.5 cc Left levator scapular 0.5  Patient tolerate the injection well, will return to clinic in 3 months for repeat injection Please dissolve 500 units of Dysport into 2.5 cc of normal saline   Courtney Myers, M.D. Ph.D.  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  Symptom onset was without apparent triggering event, she has gradually developed this neck pulling,  manifested primarily with right laterocollis, mild retrocolis, and left shoulder elevation since 2000. She has good relief with regular Botox injection, about 4 times a year since 2003. Last injection was February 18, 2010 by Dr. Marta Antu,  She reported great relief as usual, taking 4- 5 days to get symptom relieve, lasting about 3 months.  I began to inject her since 12.2011, every 3-4 months, dosage has been limited to 150 units of BOTOX A.  She denies neck pain, gait difficulty, but with wearing off Botox, she noticed increased head tremor, and pulling towards her right shoulder.  She developed  transient difficulty lifting her neck up when using BOTOX A 200 units previously, reponding well to decreased dose of 150 units  She was enrolled into IPSEN dysport study, first injection was in April 30th 2013, she later enrolled into open label, 2nd injection was in  May 28th 2013. She did very well.  Recent few weeks,  She had experienced flareup of her rheumatoid arthritis, has received IV infusion. Last injection was in 06/10/2012,  500 units of dysport was dissolved into 2 cc of normal saline. Right longissimus capitus 0.5  cc Right splenius capitis 0.5 cc Right levator scapular 0.5 cc Right iliocostalis 0.5 cc   Dysport research study has completed, she is now coming back for repeat injection, she noticed head bobbing over the past few weeks, she denies significant neck pain, weakness,  Her rheumatoid arthritis is under better control   Last injection was in Sep 2014, she did very well, no neck extension weakness, only rarely neck shaking  UPDATE March 8th 2016: She has lost follow-up since her last visit December 2014, She came in for treatment her cervical dystonia, she feel the tension of her neck all the time, stiffness. Denied gait difficulty, last injection was September 2014, she received 500 units of Dysport, works well for her, she is continue on medication for anxiety, rheumatoid arthritis She also complains of nighttime snoring, frequent awakening, excessive daytime sleepiness, fatigue, today's ESS is 6, FSS was 16,  UPDATE October 13 2014:   She has missed her scheduled sleep study, continue have worsening neck posturing, posterior neck pain, excessive daytime fatigue and sleepiness  UPDATE Nov 11 2014: She came in for EMG guided Dysport injection today, last injection was December 2014, she complains of worsening head titubation, bilateral hands mild postural tremor, posterior neck pain,  Update February 17 2015 She responded very well to last EMG guided Dysport injection Nov 11 2014, no significant head titubation, no significant neck pain  Update July 22 2015: Last injection was August, now she noticed returning of neck pulling and shaking, posterior neck muscle achy pain  Update October 21 2015: Last EMG guided Dysport injection was in January, she responded very well, only recent couple weeks, she noticed returning of neck pulling, shaking again, She has worsening rheumatoid arthritis, is receiving more frequent treatment,  complains fatigue, diffuse body achy pain  Update March 15 2016:  She responded very well to previous Dysport injection in April, no significant side effect noticed, only recent few weeks, 5 months from previous injection, she noticed return of head shaking neck muscle tightness.  UPDATE Dec 14th 2017: She responded very well to previous injection in Sept 2017, no significant side effect noticed.  Update September 13 2016: She did well this last injection in December  Update January 15 2017: She noticed worsening head tremor, she had excessive stress, her sister passed away from lung cancer in April 2018,  UPDATE Apr 25 2017: She responded very well with previous injection, barely has noticeable head shaking.  Update August 01, 2017: She responded very well to previous dysport injection, only recently began to have recurrent head shaking  UPDATE December 19 2017: She responded well to previous injection  UPDATE Jun 20 2018: She did very well with previous injection  UPDATE October 01 2018: She did well to previous injection  UPDATE January 01 2019: She did well with previous injection  REVIEW OF SYSTEMS: Full 14 system review of systems performed and notable only for as above.  All rest review of system were negative.  ALLERGIES: No Known Allergies  HOME MEDICATIONS: Current Outpatient Medications  Medication Sig Dispense Refill  . acetaminophen (TYLENOL) 325 MG tablet Take 650 mg by mouth once. One hour prior to infusion    . baclofen (LIORESAL) 10 MG tablet Take 10 mg by mouth daily as needed for muscle spasms.     Marland Kitchen BIOTIN PO Take by mouth daily.    . Cetirizine HCl (ZYRTEC ALLERGY) 10 MG TBDP Take 10 mg by mouth at bedtime. 30 tablet 0  . clonazePAM (KLONOPIN) 0.5 MG tablet TAKE 1 TABLET BY MOUTH TWICE DAILY IF NEEDED FOR ANXIETY 60 tablet 5  . diphenhydrAMINE (BENADRYL) 25 mg capsule Take 25 mg by mouth once. One hour prior to infusion    . DULoxetine (CYMBALTA) 60 MG capsule TAKE 1 CAPSULE BY MOUTH DAILY 30 capsule 2  . DYSPORT 500 units SOLR  injection INJECT 500 UNITS INTRAMUSCULARLY EVERY 3 MONTHS (GIVEN AT PRESCRIBERS OFFICE, DISCARD UNUSED AFTER 1ST USE) 1 each 3  . folic acid (FOLVITE) 1 MG tablet TAKE 1 TABLET(1 MG) BY MOUTH DAILY 90 tablet 4  . inFLIXimab (REMICADE) 100 MG injection Inject 6 mg/kg into the vein every 6 (six) weeks. Infuse 500 mg/ 5 vials in 226mL NS over 2hours every 6 weeks    . methotrexate 50 MG/2ML injection INJECT 0.3ML INTO SKIN ONCE A WEEK 4 mL 0  . Multiple Vitamins-Minerals (MULTIVITAMINS THER. W/MINERALS) TABS Take 1 tablet by mouth daily.    Marland Kitchen omeprazole (PRILOSEC) 20 MG capsule TAKE 1 CAPSULE BY MOUTH EVERY DAY 90 capsule 3  . traMADol (ULTRAM) 50 MG tablet Take 1 - 2 every 8 hours as needed for pain. 30 tablet 0  . TUBERCULIN SYR 1CC/27GX1/2" (B-D TB SYRINGE 1CC/27GX1/2") 27G X 1/2" 1 ML MISC USE ONE SYRINGE ONCE WEEKLY AS DIRECTED 4 each 11  . TURMERIC PO Take by mouth daily.    . Xylitol (XYLIMELTS MT) Use as directed in the mouth or throat.     No current facility-administered medications for this visit.     PAST MEDICAL HISTORY: Past Medical History:  Diagnosis Date  . Anxiety   . Cervical dystonia   . Depression   . Fibromyalgia   . GERD (gastroesophageal reflux disease)   . History of  shingles   . RA (rheumatoid arthritis) ([place])   . Synovitis of hand     PAST SURGICAL HISTORY: Past Surgical History:  Procedure Laterality Date  . TONSILLECTOMY    . VAGINAL HYSTERECTOMY  3/08    FAMILY HISTORY: Family History  Problem Relation Age of Onset  . Arthritis Mother   . Heart Problems Father   . Heart disease Father   . Heart disease Paternal Uncle        MI    SOCIAL HISTORY:  Social History   Socioeconomic History  . Marital status: Married    Spouse name: [name]  . Number of children: 2  . Years of education: 73  . Highest education level: Not on file  Occupational History    Employer: [employer]  . Financial resource strain: Not on file  .  Food insecurity    Worry: Not on file    Inability: Not on file  . Transportation needs    Medical: Not on file    Non-medical: Not on file  Tobacco Use  . Smoking status: Former Smoker    Packs/day: 0.50    Years: 30.00    Pack years: 15.00    Types: Cigarettes    Quit date: 05/02/2015    Years since quitting: 3.6  . Smokeless tobacco: Never Used  Substance and Sexual Activity  . Alcohol use: Yes    Alcohol/week: 0.0 standard drinks    Comment: Rare  . Drug use: Never  . Sexual activity: Yes    Birth control/protection: Surgical    Comment: 1st intercourse 58 yo-Fewer than 5 partners  Lifestyle  . Physical activity    Days per week: Not on file    Minutes per session: Not on file  . Stress: Not on file  Relationships  . Social Herbalist on phone: Not on file    Gets together: Not on file    Attends religious service: Not on file    Active member of club or organization: Not on file    Attends meetings of clubs or organizations: Not on file    Relationship status: Not on file  . Intimate partner violence    Fear of current or ex partner: Not on file    Emotionally abused: Not on file    Physically abused: Not on file    Forced sexual activity: Not on file  Other Topics Concern  . 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 Right handed.    Caffeine- 4 cups daily.     PHYSICAL EXAM   Vitals:   01/01/19 1339  BP: 122/78  Pulse: 68  Temp: 98.2 F (36.8 C)  Weight: 181 lb (82.1 kg)  Height: 5\' 6"  (1.676 m)    Not recorded      Body mass index is 29.21 kg/m.  PHYSICAL EXAMNIATION: MENTAL STATUS: Speech is normal, normal to casual conversation, and history taking   mild retrocollis, right tilt, mild right lateral shift, slight left shoulder elevation  DIAGNOSTIC DATA (LABS, IMAGING, TESTING) - I reviewed patient records,  labs, notes, testing and imaging myself where available.  ASSESSMENT AND PLAN  Courtney Myers is a 58 y.o. female with long-standing history of cervical dystonia, responded very well to previous EMG guided Dysport injection, last injection was May 2016, responded very well.   Mild  retrocollis, right tilt, mild right lateral shift, slight left shoulder elevation  500 of Dysport was dissolved into 2.5 cc of normal saline,  Right longissimus capitus 0. 5 cc Right splenius capitis 0.5 cc Right inferior oblique capitis, 0.25 cc  Left inferior oblique capitis 0.5 cc Left splenius capitus 0.25 cc Left levator scapular 0.5   Patient tolerate the injection well, will return to clinic in 3 months for repeat injection Please dissolve 500 units of Dysport into 2.5 cc of normal saline   Courtney Myers, M.D. Ph.D.  [facility] [address] [place], [place] [address] Ph: [phone] Fax: [phone]

## 2019-01-09 ENCOUNTER — Other Ambulatory Visit: Payer: Self-pay | Admitting: *Deleted

## 2019-01-09 NOTE — Progress Notes (Signed)
Infusion orders are current for patient CBC CMP Tylenol Benadryl appointments are up to date and follow up appointment  is scheduled TB gold due and order placed.  

## 2019-01-10 ENCOUNTER — Encounter: Payer: Self-pay | Admitting: Physician Assistant

## 2019-01-10 ENCOUNTER — Ambulatory Visit: Payer: 59 | Admitting: Physician Assistant

## 2019-01-10 ENCOUNTER — Other Ambulatory Visit: Payer: Self-pay

## 2019-01-10 VITALS — BP 116/73 | HR 78 | Resp 13 | Ht 66.0 in | Wt 182.0 lb

## 2019-01-10 DIAGNOSIS — R682 Dry mouth, unspecified: Secondary | ICD-10-CM

## 2019-01-10 DIAGNOSIS — M0609 Rheumatoid arthritis without rheumatoid factor, multiple sites: Secondary | ICD-10-CM | POA: Diagnosis not present

## 2019-01-10 DIAGNOSIS — Z79899 Other long term (current) drug therapy: Secondary | ICD-10-CM | POA: Diagnosis not present

## 2019-01-10 DIAGNOSIS — F32A Depression, unspecified: Secondary | ICD-10-CM

## 2019-01-10 DIAGNOSIS — M797 Fibromyalgia: Secondary | ICD-10-CM | POA: Diagnosis not present

## 2019-01-10 DIAGNOSIS — F5101 Primary insomnia: Secondary | ICD-10-CM

## 2019-01-10 DIAGNOSIS — F419 Anxiety disorder, unspecified: Secondary | ICD-10-CM

## 2019-01-10 DIAGNOSIS — F329 Major depressive disorder, single episode, unspecified: Secondary | ICD-10-CM

## 2019-01-10 DIAGNOSIS — H04129 Dry eye syndrome of unspecified lacrimal gland: Secondary | ICD-10-CM

## 2019-01-10 DIAGNOSIS — R5382 Chronic fatigue, unspecified: Secondary | ICD-10-CM

## 2019-01-10 DIAGNOSIS — Z8719 Personal history of other diseases of the digestive system: Secondary | ICD-10-CM

## 2019-01-23 ENCOUNTER — Encounter (HOSPITAL_COMMUNITY): Payer: 59

## 2019-01-30 ENCOUNTER — Ambulatory Visit (HOSPITAL_COMMUNITY)
Admission: RE | Admit: 2019-01-30 | Discharge: 2019-01-30 | Disposition: A | Discharge status: Home / Self Care | Payer: 59 | Source: Ambulatory Visit | Attending: Rheumatology | Admitting: Rheumatology

## 2019-01-30 ENCOUNTER — Other Ambulatory Visit: Payer: Self-pay

## 2019-01-30 DIAGNOSIS — M0609 Rheumatoid arthritis without rheumatoid factor, multiple sites: Secondary | ICD-10-CM | POA: Diagnosis not present

## 2019-01-30 MED ORDER — ACETAMINOPHEN 325 MG PO TABS: 650.0000 mg | ORAL_TABLET | ORAL | Status: DC

## 2019-01-30 MED ORDER — SODIUM CHLORIDE 0.9 % IV SOLN
6.0000 mg/kg | INTRAVENOUS | Status: DC
  Administered 2019-01-30: 500 mg via INTRAVENOUS
  Filled 2019-01-30 (×2): qty 50

## 2019-01-30 MED ORDER — DIPHENHYDRAMINE HCL 25 MG PO CAPS: 25.0000 mg | ORAL_CAPSULE | ORAL | Status: DC

## 2019-01-31 LAB — SJOGRENS SYNDROME-A EXTRACTABLE NUCLEAR ANTIBODY: SSA (Ro) (ENA) Antibody, IgG: <0.2 AI (ref 0.0–0.9)

## 2019-01-31 LAB — SJOGRENS SYNDROME-B EXTRACTABLE NUCLEAR ANTIBODY: SSB (La) (ENA) Antibody, IgG: <0.2 AI (ref 0.0–0.9)

## 2019-02-02 LAB — QUANTIFERON-TB GOLD PLUS: QuantiFERON-TB Gold Plus: NEGATIVE

## 2019-02-02 LAB — QUANTIFERON-TB GOLD PLUS (RQFGPL)
QuantiFERON Mitogen Value: >10 IU/mL
QuantiFERON Nil Value: 0.03 IU/mL
QuantiFERON TB1 Ag Value: 0.03 IU/mL
QuantiFERON TB2 Ag Value: 0.02 IU/mL

## 2019-02-21 ENCOUNTER — Other Ambulatory Visit: Payer: Self-pay | Admitting: Rheumatology

## 2019-02-21 NOTE — Telephone Encounter (Signed)
Last Visit: 01/10/2019 Next Visit: 06/17/2019  Okay to refill per Dr. Estanislado Pandy.

## 2019-02-25 ENCOUNTER — Other Ambulatory Visit: Payer: Self-pay | Admitting: Neurology

## 2019-02-28 ENCOUNTER — Other Ambulatory Visit: Payer: Self-pay | Admitting: Rheumatology

## 2019-02-28 NOTE — Telephone Encounter (Signed)
Last Visit: 01/10/19 Next Visit: 06/17/19 Labs: 12/12/18 Glucose is elevated 124. Rest of CMP is stable. CBC WNL.  Okay to refill per Dr. Estanislado Pandy

## 2019-03-12 ENCOUNTER — Other Ambulatory Visit: Payer: Self-pay | Admitting: *Deleted

## 2019-03-12 NOTE — Progress Notes (Signed)
Infusion orders are current for patient CBC CMP Tylenol Benadryl appointments are up to date and follow up appointment  is scheduled TB gold not due yet.  

## 2019-03-13 ENCOUNTER — Other Ambulatory Visit: Payer: Self-pay

## 2019-03-13 ENCOUNTER — Ambulatory Visit (HOSPITAL_COMMUNITY)
Admission: RE | Admit: 2019-03-13 | Discharge: 2019-03-13 | Disposition: A | Discharge status: Home / Self Care | Payer: 59 | Source: Ambulatory Visit | Attending: Rheumatology | Admitting: Rheumatology

## 2019-03-13 DIAGNOSIS — M0609 Rheumatoid arthritis without rheumatoid factor, multiple sites: Secondary | ICD-10-CM

## 2019-03-13 LAB — CBC
HCT: 36.3 % (ref 36.0–46.0)
Hemoglobin: 11.9 g/dL — ABNORMAL LOW (ref 12.0–15.0)
MCH: 30.3 pg (ref 26.0–34.0)
MCHC: 32.8 g/dL (ref 30.0–36.0)
MCV: 92.4 fL (ref 80.0–100.0)
Platelets: 230 10*3/uL (ref 150–400)
RBC: 3.93 MIL/uL (ref 3.87–5.11)
RDW: 14.1 % (ref 11.5–15.5)
WBC: 7.8 10*3/uL (ref 4.0–10.5)
nRBC: 0 % (ref 0.0–0.2)

## 2019-03-13 LAB — COMPREHENSIVE METABOLIC PANEL
ALT: 15 U/L (ref 0–44)
AST: 22 U/L (ref 15–41)
Albumin: 3.6 g/dL (ref 3.5–5.0)
Alkaline Phosphatase: 71 U/L (ref 38–126)
Anion gap: 11 (ref 5–15)
BUN: 12 mg/dL (ref 6–20)
CO2: 21 mmol/L — ABNORMAL LOW (ref 22–32)
Calcium: 8.7 mg/dL — ABNORMAL LOW (ref 8.9–10.3)
Chloride: 103 mmol/L (ref 98–111)
Creatinine, Ser: 1 mg/dL (ref 0.44–1.00)
GFR calc Af Amer: >60 mL/min (ref >60)
GFR calc non Af Amer: >60 mL/min (ref >60)
Glucose, Bld: 98 mg/dL (ref 70–99)
Potassium: 4 mmol/L (ref 3.5–5.1)
Sodium: 135 mmol/L (ref 135–145)
Total Bilirubin: 0.5 mg/dL (ref 0.3–1.2)
Total Protein: 6.7 g/dL (ref 6.5–8.1)

## 2019-03-13 MED ORDER — ACETAMINOPHEN 325 MG PO TABS
650.0000 mg | ORAL_TABLET | ORAL | Status: DC
  Administered 2019-03-13: 08:00:00 650 mg via ORAL

## 2019-03-13 MED ORDER — ACETAMINOPHEN 325 MG PO TABS
ORAL_TABLET | ORAL | Status: AC
  Administered 2019-03-13: 650 mg via ORAL
  Filled 2019-03-13: qty 1

## 2019-03-13 MED ORDER — DIPHENHYDRAMINE HCL 25 MG PO CAPS
ORAL_CAPSULE | ORAL | Status: AC
  Administered 2019-03-13: 25 mg via ORAL
  Filled 2019-03-13: qty 1

## 2019-03-13 MED ORDER — ACETAMINOPHEN 325 MG PO TABS
ORAL_TABLET | ORAL | Status: AC
  Filled 2019-03-13: qty 1

## 2019-03-13 MED ORDER — SODIUM CHLORIDE 0.9 % IV SOLN
6.0000 mg/kg | INTRAVENOUS | Status: DC
  Administered 2019-03-13: 500 mg via INTRAVENOUS
  Filled 2019-03-13: qty 50

## 2019-03-13 MED ORDER — DIPHENHYDRAMINE HCL 25 MG PO CAPS
25.0000 mg | ORAL_CAPSULE | ORAL | Status: DC
  Administered 2019-03-13: 08:00:00 25 mg via ORAL

## 2019-03-13 NOTE — Progress Notes (Signed)
Ca is low, mild anemia. Add MVI ans Ca suppplement.

## 2019-03-26 ENCOUNTER — Encounter: Payer: Self-pay | Admitting: Family Medicine

## 2019-03-26 ENCOUNTER — Telehealth: Payer: 59 | Admitting: Nurse Practitioner

## 2019-03-26 DIAGNOSIS — N3000 Acute cystitis without hematuria: Secondary | ICD-10-CM | POA: Diagnosis not present

## 2019-03-26 MED ORDER — CEPHALEXIN 500 MG PO CAPS: 500.0000 mg | ORAL_CAPSULE | Freq: Two times a day (BID) | ORAL | 0 refills | Status: DC

## 2019-03-26 NOTE — Progress Notes (Signed)

## 2019-03-27 ENCOUNTER — Telehealth: Payer: Self-pay | Admitting: Neurology

## 2019-03-27 NOTE — Telephone Encounter (Signed)
[facility] called and left a message wanting to schedule a delivery for the pt's medication.

## 2019-03-31 ENCOUNTER — Other Ambulatory Visit: Payer: Self-pay | Admitting: Neurology

## 2019-04-02 NOTE — Telephone Encounter (Signed)
I called to schedule delivery but they stated it was pending medical investigation and they will call me back to schedule. DW

## 2019-04-03 ENCOUNTER — Other Ambulatory Visit: Payer: Self-pay

## 2019-04-03 ENCOUNTER — Encounter: Payer: Self-pay | Admitting: Family Medicine

## 2019-04-03 ENCOUNTER — Ambulatory Visit: Payer: 59 | Admitting: Family Medicine

## 2019-04-03 DIAGNOSIS — Z23 Encounter for immunization: Secondary | ICD-10-CM | POA: Diagnosis not present

## 2019-04-03 DIAGNOSIS — D649 Anemia, unspecified: Secondary | ICD-10-CM | POA: Diagnosis not present

## 2019-04-03 NOTE — Progress Notes (Signed)
Subjective:    Patient ID: Courtney Myers, female    DOB: [birth year], 58 y.o.   MRN: [id number]  HPI  Patient was recently told that she had low calcium in her blood and that also she was anemic.  Therefore she scheduled appointment today to discuss these 2 issues.  She does report feeling tired.  She denies any melena or hematochezia.  She denies any bleeding or bruising.  She denies any obvious source of blood loss.  She denies any vaginal bleeding.  Regarding her calcium, she does avoid dairy products.  She is not on any calcium supplements or vitamin D supplements.  She also takes a proton pump inhibitor, Nexium, regularly which may interfere with calcium and magnesium absorption. Past Medical History:  Diagnosis Date  . Anxiety   . Cervical dystonia   . Depression   . Fibromyalgia   . GERD (gastroesophageal reflux disease)   . History of shingles   . RA (rheumatoid arthritis) ([place])   . Synovitis of hand    Past Surgical History:  Procedure Laterality Date  . TONSILLECTOMY    . VAGINAL HYSTERECTOMY  3/08   Current Outpatient Medications on File Prior to Visit  Medication Sig Dispense Refill  . acetaminophen (TYLENOL) 325 MG tablet Take 650 mg by mouth once. One hour prior to infusion    . baclofen (LIORESAL) 10 MG tablet Take 10 mg by mouth daily as needed for muscle spasms.     Marland Kitchen BIOTIN PO Take by mouth daily.    . clonazePAM (KLONOPIN) 0.5 MG tablet TAKE 1 TABLET BY MOUTH TWICE DAILY AS NEEDED FOR ANXIETY 60 tablet 5  . diphenhydrAMINE (BENADRYL) 25 mg capsule Take 25 mg by mouth once. One hour prior to infusion    . DULoxetine (CYMBALTA) 60 MG capsule TAKE ONE CAPSULE BY MOUTH DAILY 30 capsule 2  . DYSPORT 500 units SOLR injection INJECT 500 UNITS  INTRAMUSCULARLY EVERY 3  MONTHS (GIVEN AT  PRESCRIBERS OFFICE, DISCARD UNUSED AFTER 1ST USE) 1 each 3  . folic acid (FOLVITE) 1 MG tablet TAKE 1 TABLET(1 MG) BY MOUTH DAILY 90 tablet 4  . inFLIXimab (REMICADE) 100 MG  injection Inject 6 mg/kg into the vein every 6 (six) weeks. Infuse 500 mg/ 5 vials in 282mL NS over 2hours every 6 weeks    . methotrexate 50 MG/2ML injection INJECT 0.3 ML INTO THE SKIN ONCE A WEEK 4 mL 0  . Multiple Vitamins-Minerals (MULTIVITAMINS THER. W/MINERALS) TABS Take 1 tablet by mouth daily.    Marland Kitchen omeprazole (PRILOSEC) 20 MG capsule TAKE 1 CAPSULE BY MOUTH EVERY DAY 90 capsule 3  . traMADol (ULTRAM) 50 MG tablet Take 1 - 2 every 8 hours as needed for pain. 30 tablet 0  . TUBERCULIN SYR 1CC/27GX1/2" (B-D TB SYRINGE 1CC/27GX1/2") 27G X 1/2" 1 ML MISC USE ONE SYRINGE ONCE WEEKLY AS DIRECTED 4 each 11  . TURMERIC PO Take by mouth daily.    . Xylitol (XYLIMELTS MT) Use as directed in the mouth or throat.     No current facility-administered medications on file prior to visit.    No Known Allergies Social History   Socioeconomic History  . Marital status: Married    Spouse name: [name]  . Number of children: 2  . Years of education: 61  . Highest education level: Not on file  Occupational History    Employer: [employer]  . Financial resource strain: Not on file  . Food insecurity  Worry: Not on file    Inability: Not on file  . Transportation needs    Medical: Not on file    Non-medical: Not on file  Tobacco Use  . Smoking status: Former Smoker    Packs/day: 0.50    Years: 30.00    Pack years: 15.00    Types: Cigarettes    Quit date: 05/02/2015    Years since quitting: 3.9  . Smokeless tobacco: Never Used  Substance and Sexual Activity  . Alcohol use: Yes    Alcohol/week: 0.0 standard drinks    Comment: Rare  . Drug use: Never  . Sexual activity: Yes    Birth control/protection: Surgical    Comment: 1st intercourse 58 yo-Fewer than 5 partners  Lifestyle  . Physical activity    Days per week: Not on file    Minutes per session: Not on file  . Stress: Not on file  Relationships  . Social Herbalist on phone: Not on file    Gets  together: Not on file    Attends religious service: Not on file    Active member of club or organization: Not on file    Attends meetings of clubs or organizations: Not on file    Relationship status: Not on file  . Intimate partner violence    Fear of current or ex partner: Not on file    Emotionally abused: Not on file    Physically abused: Not on file    Forced sexual activity: Not on file  Other Topics Concern  . 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 Right handed.    Caffeine- 4 cups daily.     Review of Systems  All other systems reviewed and are negative.      Objective:   Physical Exam Vitals signs reviewed.  Constitutional:      Appearance: Normal appearance.  Cardiovascular:     Rate and Rhythm: Normal rate and regular rhythm.  Pulmonary:     Effort: Pulmonary effort is normal.     Breath sounds: Normal breath sounds.  Neurological:     Mental Status: She is alert.           Assessment & Plan:  Hypocalcemia - Plan: Parathyroid hormone, intact (no Ca), VITAMIN D 25 Hydroxy (Vit-D Deficiency, Fractures), Magnesium, COMPLETE METABOLIC PANEL WITH GFR  Anemia of unknown etiology - Plan: Vitamin B12, Iron, Fecal Globin By Immunochemistry, Folate  Needs flu shot - Plan: Flu Vaccine QUAD 36+ mos IM  Regarding hypocalcemia, I suspect it is multifactorial and due to poor calcium intake coupled with vitamin D deficiency compounded by proton pump inhibitor use.  I will check a PTH to rule out hypoparathyroidism.  I will check a vitamin D to rule out vitamin D deficiency.  I will check a magnesium level to rule out hypomagnesemia.  I recommended 1200 mg a day of calcium and 1000 units a day of vitamin D.  Await the results of the lab work to determine if any other treatment is necessary.  Regarding her anemia of unknown etiology, I would recommend  a stool sample to rule out an occult GI bleed.  Given the fact she is on methotrexate I will check a folic acid level.  I would also check vitamin B12 level.  Also recommended checking an iron level.  I suspect anemia of chronic  disease coupled with methotrexate use.  Very little exam was performed today is more than 20 minutes was spent with the patient in discussion

## 2019-04-04 LAB — COMPLETE METABOLIC PANEL WITH GFR
AG Ratio: 1.4 (calc) (ref 1.0–2.5)
ALT: 13 U/L (ref 6–29)
AST: 18 U/L (ref 10–35)
Albumin: 3.9 g/dL (ref 3.6–5.1)
Alkaline phosphatase (APISO): 84 U/L (ref 37–153)
BUN: 12 mg/dL (ref 7–25)
CO2: 26 mmol/L (ref 20–32)
Calcium: 9.1 mg/dL (ref 8.6–10.4)
Chloride: 105 mmol/L (ref 98–110)
Creat: 0.87 mg/dL (ref 0.50–0.99)
GFR, Est African American: 84 mL/min/{1.73_m2} (ref >=60)
GFR, Est Non African American: 72 mL/min/{1.73_m2} (ref >=60)
Globulin: 2.7 g/dL (calc) (ref 1.9–3.7)
Glucose, Bld: 102 mg/dL — ABNORMAL HIGH (ref 65–99)
Potassium: 3.9 mmol/L (ref 3.5–5.3)
Sodium: 138 mmol/L (ref 135–146)
Total Bilirubin: 0.3 mg/dL (ref 0.2–1.2)
Total Protein: 6.6 g/dL (ref 6.1–8.1)

## 2019-04-04 LAB — PARATHYROID HORMONE, INTACT (NO CA): PTH: 21 pg/mL (ref 14–64)

## 2019-04-04 LAB — IRON: Iron: 42 ug/dL — ABNORMAL LOW (ref 45–160)

## 2019-04-04 LAB — MAGNESIUM: Magnesium: 1.8 mg/dL (ref 1.5–2.5)

## 2019-04-04 LAB — FOLATE: Folate: >24 ng/mL

## 2019-04-04 LAB — VITAMIN B12: Vitamin B-12: 878 pg/mL (ref 200–1100)

## 2019-04-04 LAB — VITAMIN D 25 HYDROXY (VIT D DEFICIENCY, FRACTURES): Vit D, 25-Hydroxy: 43 ng/mL (ref 30–100)

## 2019-04-09 ENCOUNTER — Ambulatory Visit: Payer: 59 | Admitting: Neurology

## 2019-04-10 ENCOUNTER — Other Ambulatory Visit: Payer: 59

## 2019-04-10 DIAGNOSIS — D649 Anemia, unspecified: Secondary | ICD-10-CM

## 2019-04-11 LAB — FECAL GLOBIN BY IMMUNOCHEMISTRY
FECAL GLOBIN RESULT:: NOT DETECTED
MICRO NUMBER:: 969197
SPECIMEN QUALITY:: ADEQUATE

## 2019-04-13 ENCOUNTER — Encounter (INDEPENDENT_AMBULATORY_CARE_PROVIDER_SITE_OTHER): Payer: Self-pay

## 2019-04-13 ENCOUNTER — Telehealth: Payer: 59 | Admitting: Family

## 2019-04-13 DIAGNOSIS — Z20828 Contact with and (suspected) exposure to other viral communicable diseases: Secondary | ICD-10-CM

## 2019-04-13 DIAGNOSIS — Z20822 Contact with and (suspected) exposure to covid-19: Secondary | ICD-10-CM

## 2019-04-13 MED ORDER — BENZONATATE 100 MG PO CAPS: 100.0000 mg | ORAL_CAPSULE | Freq: Three times a day (TID) | ORAL | 0 refills | Status: DC | PRN

## 2019-04-13 NOTE — Progress Notes (Signed)
E-Visit for Corona Virus Screening   Your current symptoms could be consistent with the coronavirus.  Many health care providers can now test patients at their office but not all are.  [place] has multiple testing sites. For information on our COVID testing locations and hours go to [email or web address]  Please quarantine yourself while awaiting your test results.  We are enrolling you in our [place] for [place] . Daily you will receive a questionnaire within the [facility] website. Our COVID 19 response team willl be monitoriing your responses daily.  You can go to one of the  testing sites listed below, while they are opened (see hours). You do not need an order and will stay in your car during the test. You do need to self isolate until your results return and if positive 14 days from when your symptoms started and until you are 3 days symptom free.   Testing Locations (Monday - Friday, 8 a.m. - 3:30 p.m.) . [place]: [facility], [address], [place], [place]: [place], [place], [place], [place] (entrance off [address])  . [facility]: (Closed each Monday): Testing site relocated to the short stay covered drive at [facility]. (Use the [facility] entrance to [facility] next to [place] is a respiratory illness with symptoms that are similar to the flu. Symptoms are typically mild to moderate, but there have been cases of severe illness and death due to the virus. The following symptoms may appear 2-14 days after exposure: . Fever . Cough . Shortness of breath or difficulty breathing . Chills . Repeated shaking with chills . Muscle pain . Headache . Sore throat . New loss of taste or smell . Fatigue . Congestion or runny nose . Nausea or vomiting . Diarrhea  It is vitally important that if you feel that you  have an infection such as this virus or any other virus that you stay home and away from places where you may spread it to others.  You should self-quarantine for 14 days if you have symptoms that could potentially be coronavirus or have been in close contact a with a person diagnosed with COVID-19 within the last 2 weeks. You should avoid contact with people age 57 and older.   You should wear a mask or cloth face covering over your nose and mouth if you must be around other people or animals, including pets (even at home). Try to stay at least 6 feet away from other people. This will protect the people around you.  You can use medication such as A prescription cough medication called Tessalon Perles 100 mg. You may take 1-2 capsules every 8 hours as needed for cough.  You may also take acetaminophen (Tylenol) as needed for fever.   Reduce your risk of any infection by using the same precautions used for avoiding the common cold or flu:  Marland Kitchen Wash your hands often with soap and warm water for at least 20 seconds.  If soap and water are not readily available, use an alcohol-based hand sanitizer with at least 60% alcohol.  . If coughing or sneezing, cover your mouth and nose by coughing or sneezing into the elbow areas of your shirt or coat, into a tissue or into your sleeve (not your hands). . Avoid shaking hands with others and consider head nods or verbal greetings only. . Avoid touching  your eyes, nose, or mouth with unwashed hands.  . Avoid close contact with people who are sick. . Avoid places or events with large numbers of people in one location, like concerts or sporting events. . Carefully consider travel plans you have or are making. . If you are planning any travel outside or inside the [place], visit the CDC's Travelers' Health webpage for the latest health notices. . If you have some symptoms but not all symptoms, continue to monitor at home and seek medical attention if your symptoms  worsen. . If you are having a medical emergency, call 911.  HOME CARE . Only take medications as instructed by your medical team. . Drink plenty of fluids and get plenty of rest. . A steam or ultrasonic humidifier can help if you have congestion.   GET HELP RIGHT AWAY IF YOU HAVE EMERGENCY WARNING SIGNS** FOR COVID-19. If you or someone is showing any of these signs seek emergency medical care immediately. Call 911 or proceed to your closest emergency facility if: . You develop worsening high fever. . Trouble breathing . Bluish lips or face . Persistent pain or pressure in the chest . New confusion . Inability to wake or stay awake . You cough up blood. . Your symptoms become more severe  **This list is not all possible symptoms. Contact your medical provider for any symptoms that are sever or concerning to you.   MAKE SURE YOU   Understand these instructions.  Will watch your condition.  Will get help right away if you are not doing well or get worse.  Your e-visit answers were reviewed by a board certified advanced clinical practitioner to complete your personal care plan.  Depending on the condition, your plan could have included both over the counter or prescription medications.  If there is a problem please reply once you have received a response from your provider.  Your safety is important to [place].  If you have drug allergies check your prescription carefully.    You can use MyChart to ask questions about today's visit, request a non-urgent call back, or ask for a work or school excuse for 24 hours related to this e-Visit. If it has been greater than 24 hours you will need to follow up with your provider, or enter a new e-Visit to address those concerns. You will get an e-mail in the next two days asking about your experience.  I hope that your e-visit has been valuable and will speed your recovery. Thank you for using e-visits.  Approximately 5 minutes was spent documenting  and reviewing patient's chart.

## 2019-04-14 ENCOUNTER — Encounter (INDEPENDENT_AMBULATORY_CARE_PROVIDER_SITE_OTHER): Payer: Self-pay

## 2019-04-14 ENCOUNTER — Other Ambulatory Visit: Payer: Self-pay

## 2019-04-14 DIAGNOSIS — Z20822 Contact with and (suspected) exposure to covid-19: Secondary | ICD-10-CM

## 2019-04-15 ENCOUNTER — Encounter (INDEPENDENT_AMBULATORY_CARE_PROVIDER_SITE_OTHER): Payer: Self-pay

## 2019-04-15 LAB — NOVEL CORONAVIRUS, NAA: SARS-CoV-2, NAA: NOT DETECTED

## 2019-04-16 ENCOUNTER — Encounter (INDEPENDENT_AMBULATORY_CARE_PROVIDER_SITE_OTHER): Payer: Self-pay

## 2019-04-17 ENCOUNTER — Ambulatory Visit: Payer: 59 | Admitting: Neurology

## 2019-04-18 ENCOUNTER — Encounter (INDEPENDENT_AMBULATORY_CARE_PROVIDER_SITE_OTHER): Payer: Self-pay

## 2019-04-23 ENCOUNTER — Encounter (INDEPENDENT_AMBULATORY_CARE_PROVIDER_SITE_OTHER): Payer: Self-pay

## 2019-04-24 ENCOUNTER — Ambulatory Visit (HOSPITAL_COMMUNITY)
Admission: RE | Admit: 2019-04-24 | Discharge: 2019-04-24 | Disposition: A | Discharge status: Home / Self Care | Payer: 59 | Source: Ambulatory Visit | Attending: Rheumatology | Admitting: Rheumatology

## 2019-04-24 ENCOUNTER — Other Ambulatory Visit: Payer: Self-pay

## 2019-04-24 DIAGNOSIS — M0609 Rheumatoid arthritis without rheumatoid factor, multiple sites: Secondary | ICD-10-CM | POA: Diagnosis not present

## 2019-04-24 MED ORDER — ACETAMINOPHEN 325 MG PO TABS: 650.0000 mg | ORAL_TABLET | ORAL | Status: DC

## 2019-04-24 MED ORDER — SODIUM CHLORIDE 0.9 % IV SOLN
6.0000 mg/kg | INTRAVENOUS | Status: AC
  Administered 2019-04-24: 500 mg via INTRAVENOUS
  Filled 2019-04-24 (×2): qty 50

## 2019-04-24 MED ORDER — DIPHENHYDRAMINE HCL 25 MG PO CAPS: 25.0000 mg | ORAL_CAPSULE | ORAL | Status: DC

## 2019-04-30 ENCOUNTER — Other Ambulatory Visit: Payer: Self-pay

## 2019-04-30 ENCOUNTER — Encounter: Payer: Self-pay | Admitting: Neurology

## 2019-04-30 ENCOUNTER — Ambulatory Visit: Payer: 59 | Admitting: Neurology

## 2019-04-30 VITALS — BP 133/83 | HR 90 | Temp 97.6°F | Ht 66.0 in | Wt 175.5 lb

## 2019-04-30 DIAGNOSIS — G243 Spasmodic torticollis: Secondary | ICD-10-CM | POA: Diagnosis not present

## 2019-04-30 MED ORDER — ABOBOTULINUMTOXINA 500 UNITS IM SOLR
500.0000 [IU] | Freq: Once | INTRAMUSCULAR | Status: AC
  Administered 2019-04-30: 500 [IU] via INTRAMUSCULAR

## 2019-04-30 NOTE — Progress Notes (Signed)
**  Dysport 500 units x 1 vial, NDC [id number], Lot [id number], Exp 12/01/2019, specialty pharmacy.//mck,rn**

## 2019-04-30 NOTE — Progress Notes (Signed)
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  Symptom onset was without apparent triggering event, she has gradually developed this neck pulling,  manifested primarily with right laterocollis, mild retrocolis, and left shoulder elevation since 2000. She has good relief with regular Botox injection, about 4 times a year since 2003. Last injection was February 18, 2010 by Dr. Marta Antu,  She reported great relief as usual, taking 4- 5 days to get symptom relieve, lasting about 3 months.  I began to inject her since 12.2011, every 3-4 months, dosage has been limited to 150 units of BOTOX A.  She denies neck pain, gait difficulty, but with wearing off Botox, she noticed increased head tremor, and pulling towards her right shoulder.  She developed  transient difficulty lifting her neck up when using BOTOX A 200 units previously, reponding well to decreased dose of 150 units  She was enrolled into IPSEN dysport study, first injection was in April 30th 2013, she later enrolled into open label, 2nd injection was in  May 28th 2013. She did very well.  Recent few weeks,  She had experienced flareup of her rheumatoid arthritis, has received IV infusion. Last injection was in 06/10/2012,  500 units of dysport was dissolved into 2 cc of normal saline. Right longissimus capitus 0.5 cc Right splenius capitis 0.5 cc Right levator scapular 0.5 cc Right iliocostalis 0.5 cc   Dysport research study has completed, she is now coming back for repeat injection, she noticed head bobbing over the past few weeks, she denies significant neck pain, weakness,  Her rheumatoid arthritis is under better control   Last injection was in Sep 2014, she did very well, no neck extension weakness, only rarely neck shaking  UPDATE March 8th 2016: She has lost follow-up since her last  visit December 2014, She came in for treatment her cervical dystonia, she feel the tension of her neck all the time, stiffness. Denied gait difficulty, last injection was September 2014, she received 500 units of Dysport, works well for her, she is continue on medication for anxiety, rheumatoid arthritis She also complains of nighttime snoring, frequent awakening, excessive daytime sleepiness, fatigue, today's ESS is 6, FSS was 19,  UPDATE October 13 2014:   She has missed her scheduled sleep study, continue have worsening neck posturing, posterior neck pain, excessive daytime fatigue and sleepiness  UPDATE Nov 11 2014: She came in for EMG guided Dysport injection today, last injection was December 2014, she complains of worsening head titubation, bilateral hands mild postural tremor, posterior neck pain,  Update February 17 2015 She responded very well to last EMG guided Dysport injection Nov 11 2014, no significant head titubation, no significant neck pain  Update July 22 2015: Last injection was August, now she noticed returning of neck pulling and shaking, posterior neck muscle achy pain  Update October 21 2015: Last EMG guided Dysport injection was in January, she responded very well, only recent couple weeks, she noticed returning of neck pulling, shaking again, She has worsening rheumatoid arthritis, is receiving more frequent treatment, complains fatigue, diffuse body achy pain  Update March 15 2016: She responded very well to previous Dysport injection in April, no significant side effect noticed, only recent few weeks, 5 months from previous injection, she noticed return of head shaking neck muscle tightness.  UPDATE Dec 14th 2017: She responded very well  to previous injection in Sept 2017, no significant side effect noticed.  Update September 13 2016: She did well this last injection in December  Update January 15 2017: She noticed worsening head tremor, she had excessive stress,  her sister passed away from lung cancer in April 2018,  UPDATE Apr 25 2017: She responded very well with previous injection, barely has noticeable head shaking.  Update August 01, 2017: She responded very well to previous dysport injection, only recently began to have recurrent head shaking  UPDATE December 19 2017: She responded well to previous injection  UPDATE Sept 25 2019: She responded very well to previous injection  REVIEW OF SYSTEMS: Full 14 system review of systems performed and notable only for ear pain, joint pain, joint swelling, achy muscles, muscle cramps, rash, tremors, depression, anxiety  ALLERGIES: No Known Allergies  HOME MEDICATIONS: Current Outpatient Medications  Medication Sig Dispense Refill  . acetaminophen (TYLENOL) 325 MG tablet Take 650 mg by mouth once. One hour prior to infusion    . baclofen (LIORESAL) 10 MG tablet Take 10 mg by mouth daily as needed for muscle spasms.     Marland Kitchen BIOTIN PO Take by mouth daily.    . Calcium Citrate-Vitamin D (CALCIUM + D PO) Take 1,000 mg by mouth daily.    . clonazePAM (KLONOPIN) 0.5 MG tablet TAKE 1 TABLET BY MOUTH TWICE DAILY AS NEEDED FOR ANXIETY 60 tablet 5  . diphenhydrAMINE (BENADRYL) 25 mg capsule Take 25 mg by mouth once. One hour prior to infusion    . DULoxetine (CYMBALTA) 60 MG capsule TAKE ONE CAPSULE BY MOUTH DAILY 30 capsule 2  . DYSPORT 500 units SOLR injection INJECT 500 UNITS  INTRAMUSCULARLY EVERY 3  MONTHS (GIVEN AT  PRESCRIBERS OFFICE, DISCARD UNUSED AFTER 1ST USE) 1 each 3  . ferrous sulfate 325 (65 FE) MG tablet Take 325 mg by mouth daily.    . folic acid (FOLVITE) 1 MG tablet TAKE 1 TABLET(1 MG) BY MOUTH DAILY 90 tablet 4  . inFLIXimab (REMICADE) 100 MG injection Inject 6 mg/kg into the vein every 6 (six) weeks. Infuse 500 mg/ 5 vials in 282mL NS over 2hours every 6 weeks    . methotrexate 50 MG/2ML injection INJECT 0.3 ML INTO THE SKIN ONCE A WEEK 4 mL 0  . Multiple Vitamins-Minerals (MULTIVITAMINS  THER. W/MINERALS) TABS Take 1 tablet by mouth daily.    Marland Kitchen omeprazole (PRILOSEC) 20 MG capsule TAKE 1 CAPSULE BY MOUTH EVERY DAY 90 capsule 3  . traMADol (ULTRAM) 50 MG tablet Take 1 - 2 every 8 hours as needed for pain. 30 tablet 0  . TUBERCULIN SYR 1CC/27GX1/2" (B-D TB SYRINGE 1CC/27GX1/2") 27G X 1/2" 1 ML MISC USE ONE SYRINGE ONCE WEEKLY AS DIRECTED 4 each 11  . TURMERIC PO Take by mouth daily.    . Xylitol (XYLIMELTS MT) Use as directed in the mouth or throat.     No current facility-administered medications for this visit.     PAST MEDICAL HISTORY: Past Medical History:  Diagnosis Date  . Anxiety   . Cervical dystonia   . Depression   . Fibromyalgia   . GERD (gastroesophageal reflux disease)   . History of shingles   . RA (rheumatoid arthritis) ([place])   . Synovitis of hand     PAST SURGICAL HISTORY: Past Surgical History:  Procedure Laterality Date  . TONSILLECTOMY    . VAGINAL HYSTERECTOMY  3/08    FAMILY HISTORY: Family History  Problem Relation Age of  Onset  . Arthritis Mother   . Heart Problems Father   . Heart disease Father   . Heart disease Paternal Uncle        MI    SOCIAL HISTORY:  Social History   Socioeconomic History  . Marital status: Married    Spouse name: Courtney Myers  . Number of children: 2  . Years of education: 82  . Highest education level: Not on file  Occupational History    Employer: [employer]  . Financial resource strain: Not on file  . Food insecurity    Worry: Not on file    Inability: Not on file  . Transportation needs    Medical: Not on file    Non-medical: Not on file  Tobacco Use  . Smoking status: Former Smoker    Packs/day: 0.50    Years: 30.00    Pack years: 15.00    Types: Cigarettes    Quit date: 05/02/2015    Years since quitting: 3.9  . Smokeless tobacco: Never Used  Substance and Sexual Activity  . Alcohol use: Yes    Alcohol/week: 0.0 standard drinks    Comment: Rare  . Drug use: Never   . Sexual activity: Yes    Birth control/protection: Surgical    Comment: 1st intercourse 58 yo-Fewer than 5 partners  Lifestyle  . Physical activity    Days per week: Not on file    Minutes per session: Not on file  . Stress: Not on file  Relationships  . Social Herbalist on phone: Not on file    Gets together: Not on file    Attends religious service: Not on file    Active member of club or organization: Not on file    Attends meetings of clubs or organizations: Not on file    Relationship status: Not on file  . Intimate partner violence    Fear of current or ex partner: Not on file    Emotionally abused: Not on file    Physically abused: Not on file    Forced sexual activity: Not on file  Other Topics Concern  . 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 Right handed.    Caffeine- 4 cups daily.     PHYSICAL EXAM   Vitals:   04/30/19 1252  BP: 133/83  Pulse: 90  Temp: 97.6 F (36.4 C)  Weight: 175 lb 8 oz (79.6 kg)  Height: 5\' 6"  (1.676 m)    Not recorded      Body mass index is 28.33 kg/m.  PHYSICAL EXAMNIATION: MENTAL STATUS: Speech is normal, normal to casual conversation, and history taking  CRANIAL NERVES: mild retrocollis, right tilt, mild right lateral shift, slight left shoulder elevation  MOTOR: Normal tone and bulk and strength COORDINATION: No dysmetria   GAIT/STANCE: Posture is normal.  DIAGNOSTIC DATA (LABS, IMAGING, TESTING) - I reviewed patient records, labs, notes, testing and imaging myself where available.  ASSESSMENT AND PLAN  Courtney Myers is a 58 y.o. female with long-standing history of cervical dystonia, responded very well to previous EMG guided Dysport injection, last injection was May 2016, responded very well.   Mild retrocollis, right tilt, mild right lateral shift, slight left  shoulder elevation  500 of Dysport was dissolved into 2.5 cc of normal saline,  Right longissimus capitus 0. 5 cc Left  splenius capitis 0.5 cc Right inferior oblique capitis, 0.5 cc  Left inferior oblique capitis 0.5 cc Left levator scapular 0.5   Patient tolerate the injection well, will return to clinic in 3 months for repeat injection Please dissolve 500 units of Dysport into 2.5 cc of normal saline   Courtney Myers, M.D. Ph.D.  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  Symptom onset was without apparent triggering event, she has gradually developed this neck pulling,  manifested primarily with right laterocollis, mild retrocolis, and left shoulder elevation since 2000. She has good relief with regular Botox injection, about 4 times a year since 2003. Last injection was February 18, 2010 by Dr. Marta Antu,  She reported great relief as usual, taking 4- 5 days to get symptom relieve, lasting about 3 months.  I began to inject her since 12.2011, every 3-4 months, dosage has been limited to 150 units of BOTOX A.  She denies neck pain, gait difficulty, but with wearing off Botox, she noticed increased head tremor, and pulling towards her right shoulder.  She developed  transient difficulty lifting her neck up when using BOTOX A 200 units previously, reponding well to decreased dose of 150 units  She was enrolled into IPSEN dysport study, first injection was in April 30th 2013, she later enrolled into open label, 2nd injection was in  May 28th 2013. She did very well.  Recent few weeks,  She had experienced flareup of her rheumatoid arthritis, has received IV infusion. Last injection was in 06/10/2012,  500 units of dysport  was dissolved into 2 cc of normal saline. Right longissimus capitus 0.5 cc Right splenius capitis 0.5 cc Right levator scapular 0.5 cc Right iliocostalis 0.5 cc   Dysport research study has completed, she is now coming back for repeat injection, she noticed head bobbing over the past few weeks, she denies significant neck pain, weakness,  Her rheumatoid arthritis is under better control   Last injection was in Sep 2014, she did very well, no neck extension weakness, only rarely neck shaking  UPDATE March 8th 2016: She has lost follow-up since her last visit December 2014, She came in for treatment her cervical dystonia, she feel the tension of her neck all the time, stiffness. Denied gait difficulty, last injection was September 2014, she received 500 units of Dysport, works well for her, she is continue on medication for anxiety, rheumatoid arthritis She also complains of nighttime snoring, frequent awakening, excessive daytime sleepiness, fatigue, today's ESS is 6, FSS was 88,  UPDATE October 13 2014:   She has missed her scheduled sleep study, continue have worsening neck posturing, posterior neck pain, excessive daytime fatigue and sleepiness  UPDATE Nov 11 2014: She came in for EMG guided Dysport injection today, last injection was December 2014, she complains of worsening head titubation, bilateral hands mild postural tremor, posterior neck pain,  Update February 17 2015 She responded very well to last EMG guided Dysport injection Nov 11 2014, no significant head titubation, no significant neck pain  Update July 22 2015: Last injection was August, now she noticed returning of neck pulling and shaking, posterior neck muscle achy pain  Update October 21 2015: Last EMG guided Dysport injection was in January, she responded very  well, only recent couple weeks, she noticed returning of neck pulling, shaking again, She has worsening rheumatoid arthritis, is receiving more frequent treatment,  complains fatigue, diffuse body achy pain  Update March 15 2016: She responded very well to previous Dysport injection in April, no significant side effect noticed, only recent few weeks, 5 months from previous injection, she noticed return of head shaking neck muscle tightness.  UPDATE Dec 14th 2017: She responded very well to previous injection in Sept 2017, no significant side effect noticed.  Update September 13 2016: She did well this last injection in December  Update January 15 2017: She noticed worsening head tremor, she had excessive stress, her sister passed away from lung cancer in April 2018,  UPDATE Apr 25 2017: She responded very well with previous injection, barely has noticeable head shaking.  Update August 01, 2017: She responded very well to previous dysport injection, only recently began to have recurrent head shaking  UPDATE December 19 2017: She responded well to previous injection  UPDATE Jun 20 2018: She did very well with previous injection  UPDATE October 01 2018: She did well to previous injection  UPDATE January 01 2019: She did well with previous injection  UPDATE Oct 28th 2020: She did well to previous injections  REVIEW OF SYSTEMS: Full 14 system review of systems performed and notable only for as above.  All rest review of system were negative.  ALLERGIES: No Known Allergies  HOME MEDICATIONS: Current Outpatient Medications  Medication Sig Dispense Refill  . acetaminophen (TYLENOL) 325 MG tablet Take 650 mg by mouth once. One hour prior to infusion    . baclofen (LIORESAL) 10 MG tablet Take 10 mg by mouth daily as needed for muscle spasms.     Marland Kitchen BIOTIN PO Take by mouth daily.    . Calcium Citrate-Vitamin D (CALCIUM + D PO) Take 1,000 mg by mouth daily.    . clonazePAM (KLONOPIN) 0.5 MG tablet TAKE 1 TABLET BY MOUTH TWICE DAILY AS NEEDED FOR ANXIETY 60 tablet 5  . diphenhydrAMINE (BENADRYL) 25 mg capsule Take 25 mg by mouth once. One hour prior to  infusion    . DULoxetine (CYMBALTA) 60 MG capsule TAKE ONE CAPSULE BY MOUTH DAILY 30 capsule 2  . DYSPORT 500 units SOLR injection INJECT 500 UNITS  INTRAMUSCULARLY EVERY 3  MONTHS (GIVEN AT  PRESCRIBERS OFFICE, DISCARD UNUSED AFTER 1ST USE) 1 each 3  . ferrous sulfate 325 (65 FE) MG tablet Take 325 mg by mouth daily.    . folic acid (FOLVITE) 1 MG tablet TAKE 1 TABLET(1 MG) BY MOUTH DAILY 90 tablet 4  . inFLIXimab (REMICADE) 100 MG injection Inject 6 mg/kg into the vein every 6 (six) weeks. Infuse 500 mg/ 5 vials in 246mL NS over 2hours every 6 weeks    . methotrexate 50 MG/2ML injection INJECT 0.3 ML INTO THE SKIN ONCE A WEEK 4 mL 0  . Multiple Vitamins-Minerals (MULTIVITAMINS THER. W/MINERALS) TABS Take 1 tablet by mouth daily.    Marland Kitchen omeprazole (PRILOSEC) 20 MG capsule TAKE 1 CAPSULE BY MOUTH EVERY DAY 90 capsule 3  . traMADol (ULTRAM) 50 MG tablet Take 1 - 2 every 8 hours as needed for pain. 30 tablet 0  . TUBERCULIN SYR 1CC/27GX1/2" (B-D TB SYRINGE 1CC/27GX1/2") 27G X 1/2" 1 ML MISC USE ONE SYRINGE ONCE WEEKLY AS DIRECTED 4 each 11  . TURMERIC PO Take by mouth daily.    . Xylitol (XYLIMELTS MT) Use as directed in the mouth  or throat.     No current facility-administered medications for this visit.     PAST MEDICAL HISTORY: Past Medical History:  Diagnosis Date  . Anxiety   . Cervical dystonia   . Depression   . Fibromyalgia   . GERD (gastroesophageal reflux disease)   . History of shingles   . RA (rheumatoid arthritis) ([place])   . Synovitis of hand     PAST SURGICAL HISTORY: Past Surgical History:  Procedure Laterality Date  . TONSILLECTOMY    . VAGINAL HYSTERECTOMY  3/08    FAMILY HISTORY: Family History  Problem Relation Age of Onset  . Arthritis Mother   . Heart Problems Father   . Heart disease Father   . Heart disease Paternal Uncle        MI    SOCIAL HISTORY:  Social History   Socioeconomic History  . Marital status: Married    Spouse name: Courtney Myers  .  Number of children: 2  . Years of education: 23  . Highest education level: Not on file  Occupational History    Employer: [employer]  . Financial resource strain: Not on file  . Food insecurity    Worry: Not on file    Inability: Not on file  . Transportation needs    Medical: Not on file    Non-medical: Not on file  Tobacco Use  . Smoking status: Former Smoker    Packs/day: 0.50    Years: 30.00    Pack years: 15.00    Types: Cigarettes    Quit date: 05/02/2015    Years since quitting: 3.9  . Smokeless tobacco: Never Used  Substance and Sexual Activity  . Alcohol use: Yes    Alcohol/week: 0.0 standard drinks    Comment: Rare  . Drug use: Never  . Sexual activity: Yes    Birth control/protection: Surgical    Comment: 1st intercourse 58 yo-Fewer than 5 partners  Lifestyle  . Physical activity    Days per week: Not on file    Minutes per session: Not on file  . Stress: Not on file  Relationships  . Social Herbalist on phone: Not on file    Gets together: Not on file    Attends religious service: Not on file    Active member of club or organization: Not on file    Attends meetings of clubs or organizations: Not on file    Relationship status: Not on file  . Intimate partner violence    Fear of current or ex partner: Not on file    Emotionally abused: Not on file    Physically abused: Not on file    Forced sexual activity: Not on file  Other Topics Concern  . 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 Right handed.    Caffeine- 4 cups daily.     PHYSICAL EXAM   Vitals:   04/30/19 1252  BP: 133/83  Pulse: 90  Temp: 97.6 F (36.4 C)  Weight: 175 lb 8 oz (79.6 kg)  Height: 5\' 6"  (1.676 m)    Not recorded      Body mass index is 28.33 kg/m.  PHYSICAL EXAMNIATION:   Mild retrocollis, right tilt, mild  right lateral shift, slight left shoulder elevation   ASSESSMENT AND PLAN  Courtney Myers is a 58 y.o. female  with long-standing history of cervical dystonia, responded very well to previous EMG guided Dysport injection, last injection was May 2016, responded very well.   Mild retrocollis, right tilt, mild right lateral shift, slight left shoulder elevation  500 of Dysport was dissolved into 2.5 cc of normal saline,  Right longissimus capitus 0. 5 cc Right splenius capitis 0.5 cc Right inferior oblique capitis, 0.25 cc  Left inferior oblique capitis 0.5 cc Left splenius capitus 0.25 cc Left levator scapular 0.5   Patient tolerate the injection well, will return to clinic in 3 months for repeat injection Please dissolve 500 units of Dysport into 2.5 cc of normal saline   Courtney Myers, M.D. Ph.D.  [facility] [address] [place], [place] [address] Ph: [phone] Fax: [phone]

## 2019-05-05 ENCOUNTER — Other Ambulatory Visit: Payer: Self-pay

## 2019-05-05 ENCOUNTER — Ambulatory Visit (HOSPITAL_COMMUNITY)
Admission: EM | Admit: 2019-05-05 | Discharge: 2019-05-05 | Disposition: A | Discharge status: Home / Self Care | Payer: 59 | Attending: Emergency Medicine | Admitting: Emergency Medicine

## 2019-05-05 ENCOUNTER — Encounter (HOSPITAL_COMMUNITY): Payer: Self-pay

## 2019-05-05 ENCOUNTER — Telehealth: Payer: 59 | Admitting: Physician Assistant

## 2019-05-05 DIAGNOSIS — N390 Urinary tract infection, site not specified: Secondary | ICD-10-CM | POA: Insufficient documentation

## 2019-05-05 DIAGNOSIS — R300 Dysuria: Secondary | ICD-10-CM

## 2019-05-05 DIAGNOSIS — R319 Hematuria, unspecified: Secondary | ICD-10-CM

## 2019-05-05 LAB — POCT URINALYSIS DIP (DEVICE)
Bilirubin Urine: NEGATIVE
Glucose, UA: NEGATIVE mg/dL
Ketones, ur: NEGATIVE mg/dL
Nitrite: POSITIVE — AB
Protein, ur: NEGATIVE mg/dL
Specific Gravity, Urine: 1.01 (ref 1.005–1.030)
Urobilinogen, UA: 0.2 mg/dL (ref 0.0–1.0)
pH: 5 (ref 5.0–8.0)

## 2019-05-05 MED ORDER — NITROFURANTOIN MONOHYD MACRO 100 MG PO CAPS: 100.0000 mg | ORAL_CAPSULE | Freq: Two times a day (BID) | ORAL | 0 refills | Status: DC

## 2019-05-05 NOTE — Progress Notes (Signed)
Hi Courtney Myers,  I am concerned this could potentially be something different than a UTI (or a less common UTI bacteria), especially since you were treated for a UTI about a month ago.  There are several things that can cause symptoms of dysuria.  I would feel more comfortable if you provided a urine sample so we can look for bacteria, rather than possibly treating you with an incorrect medication.      Based on what you shared with me, I feel your condition warrants further evaluation and I recommend that you be seen for a face to face office visit.  NOTE: If you entered your credit card information for this eVisit, you will not be charged. You may see a "hold" on your card for the $35 but that hold will drop off and you will not have a charge processed.  If you are having a true medical emergency please call 911.     For an urgent face to face visit, [place] has four urgent care centers for your convenience:    NEW:  [facility] [place], [place] [address] .  Monday - Friday 10 am - 6 pm    . [facility]    [phone]                  Get Driving Directions  [id number] [place], [place] [address] . 10 am to 8 pm Monday-Friday . 12 pm to 8 pm Saturday-Sunday   . [facility] at [place]                  Get Driving Directions  [id number] [place], [place], [place] [address] . 8 am to 8 pm Monday-Friday . 9 am to 6 pm Saturday . 11 am to 6 pm Sunday     . [facility] at [place]                  Get Driving Directions   [address] [place], [place] [address] . 8 am to 8 pm Monday-Friday . 8 am to 4 pm Saturday-Sunday    . [facility] at                     Get Driving Directions  [id number]  [address], [place], [place] [address]  . Monday-Friday, 12 PM  to 6 PM    Your e-visit answers were reviewed by a board certified advanced clinical practitioner to complete your personal care plan.  Thank you for using e-Visits.

## 2019-05-05 NOTE — ED Provider Notes (Signed)
[place]    CSN: [id number] Arrival date & time: 05/05/19  1526      History   Chief Complaint Chief Complaint  Patient presents with  . Dysuria    HPI Courtney Myers is a 58 y.o. female.   Patient presents with dysuria, "pressure" in her lower abdomen, and malodorous urine x5 days.  She had a video visit on 03/26/2019 and was treated with Keflex.  She denies fever, chills, abdominal pain, back pain, pelvic pain, vaginal discharge, or other symptoms.  No additional treatments attempted at home.  The history is provided by the patient.    Past Medical History:  Diagnosis Date  . Anxiety   . Cervical dystonia   . Depression   . Fibromyalgia   . GERD (gastroesophageal reflux disease)   . History of shingles   . RA (rheumatoid arthritis) ([place])   . Synovitis of hand     Patient Active Problem List   Diagnosis Date Noted  . Excessive sleepiness 09/08/2014  . Fibromyalgia syndrome 08/25/2014  . RA (rheumatoid arthritis) ([place])   . GERD (gastroesophageal reflux disease)   . Anxiety   . Depression   . History of shingles   . Synovitis of hand   . Cervical dystonia 12/04/2012    Past Surgical History:  Procedure Laterality Date  . TONSILLECTOMY    . VAGINAL HYSTERECTOMY  3/08    OB History    Gravida  4   Para  2   Term      Preterm  2   AB  2   Living  2     SAB  1   TAB      Ectopic      Multiple      Live Births               Home Medications    Prior to Admission medications   Medication Sig Start Date End Date Taking? Authorizing Provider  acetaminophen (TYLENOL) 325 MG tablet Take 650 mg by mouth once. One hour prior to infusion    [provider]  baclofen (LIORESAL) 10 MG tablet Take 10 mg by mouth daily as needed for muscle spasms.  05/01/13   [provider]  BIOTIN PO Take by mouth daily.    [provider]  Calcium Citrate-Vitamin D (CALCIUM + D PO) Take 1,000 mg by mouth daily.     [provider]  clonazePAM (KLONOPIN) 0.5 MG tablet TAKE 1 TABLET BY MOUTH TWICE DAILY AS NEEDED FOR ANXIETY 04/01/19   Marcial Pacas, MD  diphenhydrAMINE (BENADRYL) 25 mg capsule Take 25 mg by mouth once. One hour prior to infusion    [provider]  DULoxetine (CYMBALTA) 60 MG capsule TAKE ONE CAPSULE BY MOUTH DAILY 02/21/19   Bo Merino, MD  DYSPORT 500 units SOLR injection INJECT 500 UNITS  INTRAMUSCULARLY EVERY 3  MONTHS (GIVEN AT  PRESCRIBERS OFFICE, DISCARD UNUSED AFTER 1ST USE) 02/25/19   Marcial Pacas, MD  ferrous sulfate 325 (65 FE) MG tablet Take 325 mg by mouth daily.    [provider]  folic acid (FOLVITE) 1 MG tablet TAKE 1 TABLET(1 MG) BY MOUTH DAILY 09/09/18   Bo Merino, MD  inFLIXimab (REMICADE) 100 MG injection Inject 6 mg/kg into the vein every 6 (six) weeks. Infuse 500 mg/ 5 vials in 278mL NS over 2hours every 6 weeks    Bo Merino, MD  methotrexate 50 MG/2ML injection INJECT 0.3 ML INTO  THE SKIN ONCE A WEEK 02/28/19   Bo Merino, MD  Multiple Vitamins-Minerals (MULTIVITAMINS THER. W/MINERALS) TABS Take 1 tablet by mouth daily.    [provider]  nitrofurantoin, macrocrystal-monohydrate, (MACROBID) 100 MG capsule Take 1 capsule (100 mg total) by mouth 2 (two) times daily. 05/05/19   Sharion Balloon, NP  omeprazole (PRILOSEC) 20 MG capsule TAKE 1 CAPSULE BY MOUTH EVERY DAY 06/12/18   [place], Modena Nunnery, MD  traMADol (ULTRAM) 50 MG tablet Take 1 - 2 every 8 hours as needed for pain. 10/22/17   Dixon, Lonie Peak, PA-C  TUBERCULIN SYR 1CC/27GX1/2" (B-D TB SYRINGE 1CC/27GX1/2") 27G X 1/2" 1 ML MISC USE ONE SYRINGE ONCE WEEKLY AS DIRECTED 12/19/18   Bo Merino, MD  TURMERIC PO Take by mouth daily.    [provider]  Xylitol (XYLIMELTS MT) Use as directed in the mouth or throat.    [provider]    Family History Family History  Problem Relation Age of Onset  . Arthritis Mother   . Heart Problems Father    . Heart disease Father   . Heart disease Paternal Uncle        MI    Social History Social History   Tobacco Use  . Smoking status: Former Smoker    Packs/day: 0.50    Years: 30.00    Pack years: 15.00    Types: Cigarettes    Quit date: 05/02/2015    Years since quitting: 4.0  . Smokeless tobacco: Never Used  Substance Use Topics  . Alcohol use: Yes    Alcohol/week: 0.0 standard drinks    Comment: Rare  . Drug use: Never     Allergies   Patient has no known allergies.   Review of Systems Review of Systems  Constitutional: Negative for chills and fever.  HENT: Negative for ear pain and sore throat.   Eyes: Negative for pain and visual disturbance.  Respiratory: Negative for cough and shortness of breath.   Cardiovascular: Negative for chest pain and palpitations.  Gastrointestinal: Positive for abdominal pain. Negative for diarrhea and vomiting.  Genitourinary: Positive for dysuria. Negative for flank pain, hematuria, pelvic pain and vaginal discharge.  Musculoskeletal: Negative for arthralgias and back pain.  Skin: Negative for color change and rash.  Neurological: Negative for seizures and syncope.  All other systems reviewed and are negative.    Physical Exam Triage Vital Signs ED Triage Vitals  Enc Vitals Group     BP 05/05/19 1543 125/68     Pulse Rate 05/05/19 1543 100     Resp 05/05/19 1543 17     Temp 05/05/19 1543 98.3 F (36.8 C)     Temp Source 05/05/19 1543 Oral     SpO2 05/05/19 1543 99 %     Weight --      Height --      Head Circumference --      Peak Flow --      Pain Score 05/05/19 1539 6     Pain Loc --      Pain Edu? --      Excl. in [place]? --    No data found.  Updated Vital Signs BP 125/68 (BP Location: Right Arm)   Pulse 100   Temp 98.3 F (36.8 C) (Oral)   Resp 17   SpO2 99%   Visual Acuity Right Eye Distance:   Left Eye Distance:   Bilateral Distance:    Right Eye Near:   Left Eye  Near:    Bilateral Near:      Physical Exam Vitals signs and nursing note reviewed.  Constitutional:      General: She is not in acute distress.    Appearance: She is well-developed. She is not ill-appearing.  HENT:     Head: Normocephalic and atraumatic.     Mouth/Throat:     Mouth: Mucous membranes are moist.     Pharynx: Oropharynx is clear.  Eyes:     Conjunctiva/sclera: Conjunctivae normal.  Neck:     Musculoskeletal: Neck supple.  Cardiovascular:     Rate and Rhythm: Normal rate and regular rhythm.     Heart sounds: No murmur.  Pulmonary:     Effort: Pulmonary effort is normal. No respiratory distress.     Breath sounds: Normal breath sounds.  Abdominal:     General: Bowel sounds are normal.     Palpations: Abdomen is soft.     Tenderness: There is no abdominal tenderness. There is no right CVA tenderness, left CVA tenderness, guarding or rebound.  Skin:    General: Skin is warm and dry.  Neurological:     Mental Status: She is alert.      UC Treatments / Results  Labs (all labs ordered are listed, but only abnormal results are displayed) Labs Reviewed  POCT URINALYSIS DIP (DEVICE) - Abnormal; Notable for the following components:      Result Value   Hgb urine dipstick LARGE (*)    Nitrite POSITIVE (*)    Leukocytes,Ua MODERATE (*)    All other components within normal limits  URINE CULTURE    EKG   Radiology No results found.  Procedures Procedures (including critical care time)  Medications Ordered in UC Medications - No data to display  Initial Impression / Assessment and Plan / UC Course  I have reviewed the triage vital signs and the nursing notes.  Pertinent labs & imaging results that were available during my care of the patient were reviewed by me and considered in my medical decision making (see chart for details).    UTI.  Treating with Macrobid.  Urine culture pending.  Discussed with patient we will call her if her antibiotic needs to be changed based on the  results of the urine culture.  Instructed her to follow-up with her primary care provider in 1 week if her symptoms are not improving or sooner if her symptoms get worse.  Patient agrees to plan of care.     Final Clinical Impressions(s) / UC Diagnoses   Final diagnoses:  Urinary tract infection with hematuria, site unspecified     Discharge Instructions     Take the antibiotic Macrobid as directed.    A urine culture is pending.  We will call you if the antibiotic needs to be changed.    Follow-up with your primary care provider in 1 week if your symptoms are not improving.  Follow up sooner if your symptoms get worse.        ED Prescriptions    Medication Sig Dispense Auth. Provider   nitrofurantoin, macrocrystal-monohydrate, (MACROBID) 100 MG capsule Take 1 capsule (100 mg total) by mouth 2 (two) times daily. 10 capsule Sharion Balloon, NP     PDMP not reviewed this encounter.   Sharion Balloon, NP 05/05/19 [phone]

## 2019-05-05 NOTE — Discharge Instructions (Signed)
Take the antibiotic Macrobid as directed.    A urine culture is pending.  We will call you if the antibiotic needs to be changed.    Follow-up with your primary care provider in 1 week if your symptoms are not improving.  Follow up sooner if your symptoms get worse.

## 2019-05-05 NOTE — ED Triage Notes (Signed)
Pt states having pain and burning sensation when urinating X 5 days. Pt states having lower abdominal pain x 5 days. Pt reports she had an UTI 1 month ago and she did an e-visit, and she was on Keflex 500 mg.

## 2019-05-07 ENCOUNTER — Other Ambulatory Visit: Payer: Self-pay

## 2019-05-07 ENCOUNTER — Encounter: Payer: Self-pay | Admitting: Family Medicine

## 2019-05-07 LAB — URINE CULTURE: Culture: >=100000 — AB

## 2019-05-07 NOTE — Telephone Encounter (Signed)
Dr. Estanislado Pandy would like the patient to follow up with PCP for further evaluation since she continues to be symptomatic.  She will require a urine culture.    Infections can occasionally worsen joint pain and cause increased inflammation.  She will need to make sure the infection has completely cleared prior to her next infusion.   Please advise patient to notify us if her joint pain and joint swelling persists after being treated for the UTI

## 2019-05-08 ENCOUNTER — Encounter: Payer: Self-pay | Admitting: Family Medicine

## 2019-05-08 ENCOUNTER — Ambulatory Visit: Payer: 59 | Admitting: Family Medicine

## 2019-05-08 VITALS — BP 118/70 | HR 90 | Temp 97.4°F | Resp 16 | Ht 66.0 in | Wt 181.0 lb

## 2019-05-08 DIAGNOSIS — N3000 Acute cystitis without hematuria: Secondary | ICD-10-CM | POA: Diagnosis not present

## 2019-05-08 DIAGNOSIS — D508 Other iron deficiency anemias: Secondary | ICD-10-CM | POA: Diagnosis not present

## 2019-05-08 LAB — CBC WITH DIFFERENTIAL/PLATELET
Absolute Monocytes: 788 cells/uL (ref 200–950)
Basophils Absolute: 70 cells/uL (ref 0–200)
Basophils Relative: 0.9 %
Eosinophils Absolute: 211 cells/uL (ref 15–500)
Eosinophils Relative: 2.7 %
HCT: 35.4 % (ref 35.0–45.0)
Hemoglobin: 11.9 g/dL (ref 11.7–15.5)
Lymphs Abs: 2753 cells/uL (ref 850–3900)
MCH: 30 pg (ref 27.0–33.0)
MCHC: 33.6 g/dL (ref 32.0–36.0)
MCV: 89.2 fL (ref 80.0–100.0)
MPV: 11.6 fL (ref 7.5–12.5)
Monocytes Relative: 10.1 %
Neutro Abs: 3978 cells/uL (ref 1500–7800)
Neutrophils Relative %: 51 %
Platelets: 275 10*3/uL (ref 140–400)
RBC: 3.97 10*6/uL (ref 3.80–5.10)
RDW: 12.9 % (ref 11.0–15.0)
Total Lymphocyte: 35.3 %
WBC: 7.8 10*3/uL (ref 3.8–10.8)

## 2019-05-08 MED ORDER — CEPHALEXIN 500 MG PO CAPS: 500.0000 mg | ORAL_CAPSULE | Freq: Three times a day (TID) | ORAL | 0 refills | Status: DC

## 2019-05-08 NOTE — Progress Notes (Signed)
Subjective:    Patient ID: Courtney Myers, female    DOB: 1961/04/15, 58 y.o.   MRN: [id number]  HPI Patient is a very kind 58 year old Caucasian female who presents today for follow-up.  She was recently seen on November 2 at an urgent care with symptoms consistent with a urinary tract infection.  The point-of-care urinalysis suggested a urinary tract infection.  She was appropriately started on Macrobid for empiric therapy.  However symptoms have worsened over the last week despite taking the antibiotic.  She continues to complain of dysuria, frequency, and pressure.  She is also starting to complain of systemic symptoms as well including body aches and joint pains and chills.  However urine culture just returned today showing that she had Klebsiella pneumonia and that there was intermediate resistance to Macrobid.  This is most likely the reason that she is not improving and also starting to demonstrate signs of a systemic illness.  Past Medical History:  Diagnosis Date  . Anxiety   . Cervical dystonia   . Depression   . Fibromyalgia   . GERD (gastroesophageal reflux disease)   . History of shingles   . RA (rheumatoid arthritis) ([place])   . Synovitis of hand    Past Surgical History:  Procedure Laterality Date  . TONSILLECTOMY    . VAGINAL HYSTERECTOMY  3/08   Current Outpatient Medications on File Prior to Visit  Medication Sig Dispense Refill  . acetaminophen (TYLENOL) 325 MG tablet Take 650 mg by mouth once. One hour prior to infusion    . baclofen (LIORESAL) 10 MG tablet Take 10 mg by mouth daily as needed for muscle spasms.     Marland Kitchen BIOTIN PO Take by mouth daily.    . Calcium Citrate-Vitamin D (CALCIUM + D PO) Take 1,000 mg by mouth daily.    . clonazePAM (KLONOPIN) 0.5 MG tablet TAKE 1 TABLET BY MOUTH TWICE DAILY AS NEEDED FOR ANXIETY 60 tablet 5  . diphenhydrAMINE (BENADRYL) 25 mg capsule Take 25 mg by mouth once. One hour prior to infusion    . DULoxetine (CYMBALTA) 60  MG capsule TAKE ONE CAPSULE BY MOUTH DAILY 30 capsule 2  . DYSPORT 500 units SOLR injection INJECT 500 UNITS  INTRAMUSCULARLY EVERY 3  MONTHS (GIVEN AT  PRESCRIBERS OFFICE, DISCARD UNUSED AFTER 1ST USE) 1 each 3  . ferrous sulfate 325 (65 FE) MG tablet Take 325 mg by mouth daily.    . folic acid (FOLVITE) 1 MG tablet TAKE 1 TABLET(1 MG) BY MOUTH DAILY 90 tablet 4  . inFLIXimab (REMICADE) 100 MG injection Inject 6 mg/kg into the vein every 6 (six) weeks. Infuse 500 mg/ 5 vials in 212mL NS over 2hours every 6 weeks    . methotrexate 50 MG/2ML injection INJECT 0.3 ML INTO THE SKIN ONCE A WEEK 4 mL 0  . Multiple Vitamins-Minerals (MULTIVITAMINS THER. W/MINERALS) TABS Take 1 tablet by mouth daily.    . nitrofurantoin, macrocrystal-monohydrate, (MACROBID) 100 MG capsule Take 1 capsule (100 mg total) by mouth 2 (two) times daily. 10 capsule 0  . omeprazole (PRILOSEC) 20 MG capsule TAKE 1 CAPSULE BY MOUTH EVERY DAY 90 capsule 3  . traMADol (ULTRAM) 50 MG tablet Take 1 - 2 every 8 hours as needed for pain. 30 tablet 0  . TUBERCULIN SYR 1CC/27GX1/2" (B-D TB SYRINGE 1CC/27GX1/2") 27G X 1/2" 1 ML MISC USE ONE SYRINGE ONCE WEEKLY AS DIRECTED 4 each 11  . TURMERIC PO Take by mouth daily.    Marland Kitchen  Xylitol (XYLIMELTS MT) Use as directed in the mouth or throat.     No current facility-administered medications on file prior to visit.    No Known Allergies Social History   Socioeconomic History  . Marital status: Married    Spouse name: [name]  . Number of children: 2  . Years of education: 47  . Highest education level: Not on file  Occupational History    Employer: [employer]  . Financial resource strain: Not on file  . Food insecurity    Worry: Not on file    Inability: Not on file  . Transportation needs    Medical: Not on file    Non-medical: Not on file  Tobacco Use  . Smoking status: Former Smoker    Packs/day: 0.50    Years: 30.00    Pack years: 15.00    Types: Cigarettes     Quit date: 05/02/2015    Years since quitting: 4.0  . Smokeless tobacco: Never Used  Substance and Sexual Activity  . Alcohol use: Yes    Alcohol/week: 0.0 standard drinks    Comment: Rare  . Drug use: Never  . Sexual activity: Yes    Birth control/protection: Surgical    Comment: 1st intercourse 58 yo-Fewer than 5 partners  Lifestyle  . Physical activity    Days per week: Not on file    Minutes per session: Not on file  . Stress: Not on file  Relationships  . Social Herbalist on phone: Not on file    Gets together: Not on file    Attends religious service: Not on file    Active member of club or organization: Not on file    Attends meetings of clubs or organizations: Not on file    Relationship status: Not on file  . Intimate partner violence    Fear of current or ex partner: Not on file    Emotionally abused: Not on file    Physically abused: Not on file    Forced sexual activity: Not on file  Other Topics Concern  . 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 Right handed.    Caffeine- 4 cups daily.     Review of Systems  All other systems reviewed and are negative.      Objective:   Physical Exam Vitals signs reviewed.  Constitutional:      Appearance: Normal appearance.  Cardiovascular:     Rate and Rhythm: Normal rate and regular rhythm.  Pulmonary:     Effort: Pulmonary effort is normal.     Breath sounds: Normal breath sounds.  Neurological:     Mental Status: She is alert.           Assessment & Plan:  Other iron deficiency anemia - Plan: CBC with Differential  Acute cystitis without hematuria  Culture shows sensitivity to cefazolin.  Therefore I will start the patient on Keflex 500 mg p.o. 3 times daily for 7 days given the complicated infection.  While the patient is here I will repeat a CBC.  She was  diagnosed with mild iron deficiency anemia at her last visit.  Symptomatically she is doing better since taking the iron.  Once hemoglobin is back to normal, I would discontinue iron and replace with a simple daily multivitamin.  Await the results of the CBC.  If symptoms do not improve dramatically on Keflex, I would switch to Cipro and begin a work-up for other potential infections.

## 2019-05-20 ENCOUNTER — Other Ambulatory Visit: Payer: Self-pay | Admitting: *Deleted

## 2019-05-20 DIAGNOSIS — M0609 Rheumatoid arthritis without rheumatoid factor, multiple sites: Secondary | ICD-10-CM

## 2019-05-20 NOTE — Telephone Encounter (Signed)
Infusion orders placed

## 2019-05-20 NOTE — Telephone Encounter (Signed)
Called [facility] and spoke to [facility], she stated they were advising patient to get new infusion orders and were not referring to pre-certification. Previous orders expired 05/18/19.     Called [employer], and confirmed patient has an active pre-certification for her infusion that is valid through 07/18/2019.  9:51 AM Beatriz Chancellor, CPhT

## 2019-05-22 ENCOUNTER — Other Ambulatory Visit: Payer: Self-pay | Admitting: *Deleted

## 2019-05-22 NOTE — Progress Notes (Signed)
Infusion orders are current for patient CBC CMP Tylenol Benadryl appointments are up to date and follow up appointment  is scheduled TB gold not due yet.  

## 2019-05-27 MED ORDER — DULOXETINE HCL 60 MG PO CPEP: 60.0000 mg | ORAL_CAPSULE | Freq: Every day | ORAL | 2 refills | Status: DC

## 2019-05-27 NOTE — Telephone Encounter (Signed)
Last Visit: 01/10/2019 Next Visit: 06/17/2019  Last fill: 02/21/2019  Okay to refill per Dr. Estanislado Pandy.

## 2019-06-02 ENCOUNTER — Telehealth: Payer: Self-pay | Admitting: Rheumatology

## 2019-06-02 NOTE — Telephone Encounter (Signed)
Caren Griffins, nurse from [facility] called stating after 07/17/18 patient will need to have infusions at another facility.   is no longer participating with [facility] for infusions.

## 2019-06-05 ENCOUNTER — Encounter: Payer: Self-pay | Admitting: Family Medicine

## 2019-06-05 ENCOUNTER — Inpatient Hospital Stay (HOSPITAL_COMMUNITY): Admission: RE | Admit: 2019-06-05 | Payer: 59 | Source: Ambulatory Visit

## 2019-06-05 ENCOUNTER — Other Ambulatory Visit: Payer: Self-pay

## 2019-06-05 ENCOUNTER — Ambulatory Visit: Payer: 59 | Admitting: Family Medicine

## 2019-06-05 VITALS — BP 118/68 | HR 54 | Temp 96.9°F | Resp 16 | Ht 66.0 in | Wt 175.0 lb

## 2019-06-05 DIAGNOSIS — R300 Dysuria: Secondary | ICD-10-CM | POA: Diagnosis not present

## 2019-06-05 LAB — URINALYSIS, ROUTINE W REFLEX MICROSCOPIC
Bilirubin Urine: NEGATIVE
Glucose, UA: NEGATIVE
Hyaline Cast: NONE SEEN /LPF
Ketones, ur: NEGATIVE
Nitrite: NEGATIVE
Protein, ur: NEGATIVE
Specific Gravity, Urine: 1.01 (ref 1.001–1.03)
pH: 5.5 (ref 5.0–8.0)

## 2019-06-05 LAB — MICROSCOPIC MESSAGE

## 2019-06-05 MED ORDER — CIPROFLOXACIN HCL 500 MG PO TABS: 500.0000 mg | ORAL_TABLET | Freq: Two times a day (BID) | ORAL | 0 refills | Status: DC

## 2019-06-05 MED ORDER — ESTRADIOL 0.1 MG/GM VA CREA: 1.0000 | TOPICAL_CREAM | Freq: Every day | VAGINAL | 12 refills | Status: DC

## 2019-06-05 NOTE — Progress Notes (Signed)
Subjective:    Patient ID: Courtney Myers, female    DOB: [birth year], 58 y.o.   MRN: [id number]  HPI   05/08/19 Patient is a very kind 58 year old Caucasian female who presents today for follow-up.  She was recently seen on November 2 at an urgent care with symptoms consistent with a urinary tract infection.  The point-of-care urinalysis suggested a urinary tract infection.  She was appropriately started on Macrobid for empiric therapy.  However symptoms have worsened over the last week despite taking the antibiotic.  She continues to complain of dysuria, frequency, and pressure.  She is also starting to complain of systemic symptoms as well including body aches and joint pains and chills.  However urine culture just returned today showing that she had Klebsiella pneumonia and that there was intermediate resistance to Macrobid.  This is most likely the reason that she is not improving and also starting to demonstrate signs of a systemic illness.  At that time, my plan was Culture shows sensitivity to cefazolin.  Therefore I will start the patient on Keflex 500 mg p.o. 3 times daily for 7 days given the complicated infection.  While the patient is here I will repeat a CBC.  She was diagnosed with mild iron deficiency anemia at her last visit.  Symptomatically she is doing better since taking the iron.  Once hemoglobin is back to normal, I would discontinue iron and replace with a simple daily multivitamin.  Await the results of the CBC.  If symptoms do not improve dramatically on Keflex, I would switch to Cipro and begin a work-up for other potential infections.  06/05/19  Patient states that her symptoms completely went away after she finished her last antibiotics however less than 2 weeks later, the patient has again developed symptoms of a bladder infection.  She reports frequency, urgency, hesitancy, and dysuria.  Urinalysis shows +2 blood and +2 leukocyte esterase.  The patient is not sexually  active and therefore she does not believe intercourse is the cause of her recurrent infections.  She wipes properly front to back.  She does have episodes of urinary incontinence and she does use a pad that she seldom changes during the day which might be a source of infection.  She also reports severe vaginal dryness that could also be a source of the infection.  In fact she would be interested in treating the vaginal dryness particular if it would help prevent recurrent infections Past Medical History:  Diagnosis Date  . Anxiety   . Cervical dystonia   . Depression   . Fibromyalgia   . GERD (gastroesophageal reflux disease)   . History of shingles   . RA (rheumatoid arthritis) ([place])   . Synovitis of hand    Past Surgical History:  Procedure Laterality Date  . TONSILLECTOMY    . VAGINAL HYSTERECTOMY  3/08   Current Outpatient Medications on File Prior to Visit  Medication Sig Dispense Refill  . acetaminophen (TYLENOL) 325 MG tablet Take 650 mg by mouth once. One hour prior to infusion    . baclofen (LIORESAL) 10 MG tablet Take 10 mg by mouth daily as needed for muscle spasms.     Marland Kitchen BIOTIN PO Take by mouth daily.    . Calcium Citrate-Vitamin D (CALCIUM + D PO) Take 1,000 mg by mouth daily.    . cephALEXin (KEFLEX) 500 MG capsule Take 1 capsule (500 mg total) by mouth 3 (three) times daily. 21 capsule 0  . clonazePAM (  KLONOPIN) 0.5 MG tablet TAKE 1 TABLET BY MOUTH TWICE DAILY AS NEEDED FOR ANXIETY 60 tablet 5  . diphenhydrAMINE (BENADRYL) 25 mg capsule Take 25 mg by mouth once. One hour prior to infusion    . DULoxetine (CYMBALTA) 60 MG capsule Take 1 capsule (60 mg total) by mouth daily. 30 capsule 2  . DYSPORT 500 units SOLR injection INJECT 500 UNITS  INTRAMUSCULARLY EVERY 3  MONTHS (GIVEN AT  PRESCRIBERS OFFICE, DISCARD UNUSED AFTER 1ST USE) 1 each 3  . ferrous sulfate 325 (65 FE) MG tablet Take 325 mg by mouth daily.    . folic acid (FOLVITE) 1 MG tablet TAKE 1 TABLET(1 MG) BY MOUTH  DAILY 90 tablet 4  . inFLIXimab (REMICADE) 100 MG injection Inject 6 mg/kg into the vein every 6 (six) weeks. Infuse 500 mg/ 5 vials in 281mL NS over 2hours every 6 weeks    . methotrexate 50 MG/2ML injection INJECT 0.3 ML INTO THE SKIN ONCE A WEEK 4 mL 0  . Multiple Vitamins-Minerals (MULTIVITAMINS THER. W/MINERALS) TABS Take 1 tablet by mouth daily.    . nitrofurantoin, macrocrystal-monohydrate, (MACROBID) 100 MG capsule Take 1 capsule (100 mg total) by mouth 2 (two) times daily. 10 capsule 0  . omeprazole (PRILOSEC) 20 MG capsule TAKE 1 CAPSULE BY MOUTH EVERY DAY 90 capsule 3  . traMADol (ULTRAM) 50 MG tablet Take 1 - 2 every 8 hours as needed for pain. 30 tablet 0  . TUBERCULIN SYR 1CC/27GX1/2" (B-D TB SYRINGE 1CC/27GX1/2") 27G X 1/2" 1 ML MISC USE ONE SYRINGE ONCE WEEKLY AS DIRECTED 4 each 11  . TURMERIC PO Take by mouth daily.    . Xylitol (XYLIMELTS MT) Use as directed in the mouth or throat.     No current facility-administered medications on file prior to visit.    No Known Allergies Social History   Socioeconomic History  . Marital status: Married    Spouse name: [name]  . Number of children: 2  . Years of education: 77  . Highest education level: Not on file  Occupational History    Employer: [employer]  . Financial resource strain: Not on file  . Food insecurity    Worry: Not on file    Inability: Not on file  . Transportation needs    Medical: Not on file    Non-medical: Not on file  Tobacco Use  . Smoking status: Former Smoker    Packs/day: 0.50    Years: 30.00    Pack years: 15.00    Types: Cigarettes    Quit date: 05/02/2015    Years since quitting: 4.0  . Smokeless tobacco: Never Used  Substance and Sexual Activity  . Alcohol use: Yes    Alcohol/week: 0.0 standard drinks    Comment: Rare  . Drug use: Never  . Sexual activity: Yes    Birth control/protection: Surgical    Comment: 1st intercourse 58 yo-Fewer than 5 partners  Lifestyle   . Physical activity    Days per week: Not on file    Minutes per session: Not on file  . Stress: Not on file  Relationships  . Social Herbalist on phone: Not on file    Gets together: Not on file    Attends religious service: Not on file    Active member of club or organization: Not on file    Attends meetings of clubs or organizations: Not on file    Relationship status: Not  on file  . Intimate partner violence    Fear of current or ex partner: Not on file    Emotionally abused: Not on file    Physically abused: Not on file    Forced sexual activity: Not on file  Other Topics Concern  . 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 Right handed.    Caffeine- 4 cups daily.     Review of Systems  All other systems reviewed and are negative.      Objective:   Physical Exam Vitals signs reviewed.  Constitutional:      Appearance: Normal appearance.  Cardiovascular:     Rate and Rhythm: Normal rate and regular rhythm.  Pulmonary:     Effort: Pulmonary effort is normal.     Breath sounds: Normal breath sounds.  Neurological:     Mental Status: She is alert.           Assessment & Plan:  Dysuria - Plan: Urinalysis, Routine w reflex microscopic  Urinalysis suggest another urinary tract infection.  This is become frequent and recurrent for the patient.  We will treat this with Cipro 500 mg p.o. twice daily for 7 days.  We will also focus on prevention.  Patient will change her incontinence pad more frequently and she will also try Estrace 1 g intravaginally 3-4 nights a week to improve vaginal dryness to see if this helps decrease the frequency of the infections and treat her vaginal dryness.  We discussed the risk theoretical of estrogen replacement including breast cancer, endometrial cancer, strokes and DVTs and the patient is willing to accept  the very slight theoretical risk of topical estrogen

## 2019-06-05 NOTE — Telephone Encounter (Signed)
Commercial plans are requesting patients to use freestanding infusion centers. Will work on transitioning patient to [employer].

## 2019-06-06 NOTE — Telephone Encounter (Signed)
Left patient a message to advise of change and referral.

## 2019-06-09 NOTE — Telephone Encounter (Signed)
Patient was referred to [facility] infusion on 06/06/2019, they will submit new Precertification for their location.

## 2019-06-09 NOTE — Telephone Encounter (Signed)
Remicade, needs prior authorization, good through 07/18/2019, needs renewal, [phone]

## 2019-06-13 ENCOUNTER — Telehealth: Payer: Self-pay | Admitting: Rheumatology

## 2019-06-13 NOTE — Telephone Encounter (Signed)
[employer] calling to see if you need any information from them for patient infusion transfer. Please call if you need them.

## 2019-06-16 NOTE — Progress Notes (Signed)
Virtual Visit via Telephone Note  I connected with Courtney Myers on 06/17/19 at  8:00 AM EST by telephone and verified that I am speaking with the correct person using two identifiers.  Location: Patient: Home  Provider: Clinic  This service was conducted via virtual visit.  The patient was located at home. I was located in my office.  Consent was obtained prior to the virtual visit and is aware of possible charges through their insurance for this visit.  The patient is an established patient.  Dr. Estanislado Pandy, MD conducted the virtual visit and Hazel Sams, PA-C acted as scribe during the service.  Office staff helped with scheduling follow up visits after the service was conducted.   I discussed the limitations, risks, security and privacy concerns of performing an evaluation and management service by telephone and the availability of in person appointments. I also discussed with the patient that there may be a patient responsible charge related to this service. The patient expressed understanding and agreed to proceed.  CC: Dry mouth  History of Present Illness: Patient is a 58 year old female with past medical history of seronegative rheumatoid arthritis and fibromyalgia.  She receives Remicade infusions every 6 weeks and MTX 0.3 ml sq once weekly.  She has been following along closely with her PCP for recurrent UTIs.  She has had 3 UTIs in the past 3 months.  She had to postpone her last infusion due to being on antibiotics. She reports she has intermittent pain and inflammation in both wrist joints.  She has no joint pain or joint swelling currently. She states that overall her fibromyalgia has been manageable.  She has intermittent fatigue and brain fog.  She continues to have significant mouth dryness and uses OTC products.   Review of Systems  Constitutional: Positive for malaise/fatigue. Negative for fever.  HENT:       +Dry mouth  Eyes: Negative for photophobia, pain, discharge  and redness.  Respiratory: Negative for cough, shortness of breath and wheezing.   Cardiovascular: Negative for chest pain and palpitations.  Gastrointestinal: Negative for blood in stool, constipation and diarrhea.  Genitourinary: Negative for dysuria.  Musculoskeletal: Negative for back pain, joint pain, myalgias and neck pain.       +Morning stiffness  Skin: Negative for rash.  Neurological: Negative for dizziness and headaches.  Psychiatric/Behavioral: Negative for depression. The patient is not nervous/anxious and does not have insomnia.       Observations/Objective: Physical Exam  Constitutional: She is oriented to person, place, and time.  Neurological: She is alert and oriented to person, place, and time.  Psychiatric: Mood, memory, affect and judgment normal.   Patient reports morning stiffness for 5-10 minutes.   Patient denies nocturnal pain.  Difficulty dressing/grooming: Denies Difficulty climbing stairs: Reports Difficulty getting out of chair: Reports Difficulty using hands for taps, buttons, cutlery, and/or writing: Denies   Assessment and Plan: Visit Diagnoses: Rheumatoid arthritis of multiple sites with negative rheumatoid factor ([place]) - She has not had any recent rheumatoid arthritis flares.  She has occasional pain and inflammation in bilateral wrist joints.  She is overall clinically doing well on Remicade IV infusions 6 mg/kg every 6 weeks, MTX 0.3 ml sq once weekly, and folic acid 1 mg po daily.  She had to postpone her last infusion due to experiencing recurrent UTIs.  She has had 3 UTIs in the past 3 months.  We will place a referral to urology for further evaluation and treatment.  She was advised to be cleared prior to resuming Remicade infusions.  She does not want make any changes to the dosing/spacing of Remicade infusions at this time.  She was advised to notify us if she develops increased joint pain or joint swelling.  She will follow up in 3-4 months.      High risk medication use - Remicade infusion 6 mg/kg every 6 weeks, methotrexate 0.3 mL every 7 days, and folic acid 1 mg 1 tablet daily.  Last TB gold negative on 01/30/19 and will monitor yearly. CBC was drawn on 05/08/19.  CMP was drawn on 04/03/19.   Fibromyalgia -  Her fibromyalgia pain has been manageable.  She takes tramadol very sparingly.  She has intermittent fatigue and brain fog, but overall she has been sleeping well at night.  We discussed the importance of good sleep hygiene and need for regular exercise.    Chronic fatigue - She has chronic fatigue which has been stable.   Primary insomnia: She has not had any difficulty sleeping at night.  She has not had any nocturnal pain recently.   Mouth dryness -[place]-, [place]-:She has severe mouth dryness.  She uses OTC products on a daily basis for symptomatic relief.  We discussed some of the medications that she is taking that could be contributing to her symptoms.   Eye dryness - Stable  Recurrent UTI: She has been experiencing recurrent UTIs and has been following up with her PCP.  She has had 3 UTIs in the past 3 months.  She has had to postpone her last infusion due to being on antibiotics.  We will refer her to urology for further evaluation and treatment.   Other medical conditions are listed as follows:   History of gastroesophageal reflux (GERD)   Anxiety and depression     Follow Up Instructions: She will follow up in 3-4 months.   I discussed the assessment and treatment plan with the patient. The patient was provided an opportunity to ask questions and all were answered. The patient agreed with the plan and demonstrated an understanding of the instructions.   The patient was advised to call back or seek an in-person evaluation if the symptoms worsen or if the condition fails to improve as anticipated.  I provided 15 minutes of non-face-to-face time during this encounter.   Bo Merino, MD   Scribed  by-  Hazel Sams, PA-C

## 2019-06-17 ENCOUNTER — Telehealth: Payer: Self-pay | Admitting: Rheumatology

## 2019-06-17 ENCOUNTER — Other Ambulatory Visit: Payer: Self-pay | Admitting: *Deleted

## 2019-06-17 ENCOUNTER — Telehealth (INDEPENDENT_AMBULATORY_CARE_PROVIDER_SITE_OTHER): Payer: 59 | Admitting: Rheumatology

## 2019-06-17 ENCOUNTER — Other Ambulatory Visit: Payer: Self-pay

## 2019-06-17 ENCOUNTER — Encounter: Payer: Self-pay | Admitting: Rheumatology

## 2019-06-17 DIAGNOSIS — Z79899 Other long term (current) drug therapy: Secondary | ICD-10-CM

## 2019-06-17 DIAGNOSIS — F5101 Primary insomnia: Secondary | ICD-10-CM

## 2019-06-17 DIAGNOSIS — R5382 Chronic fatigue, unspecified: Secondary | ICD-10-CM | POA: Diagnosis not present

## 2019-06-17 DIAGNOSIS — M0609 Rheumatoid arthritis without rheumatoid factor, multiple sites: Secondary | ICD-10-CM | POA: Diagnosis not present

## 2019-06-17 DIAGNOSIS — N390 Urinary tract infection, site not specified: Secondary | ICD-10-CM

## 2019-06-17 DIAGNOSIS — Z8719 Personal history of other diseases of the digestive system: Secondary | ICD-10-CM

## 2019-06-17 DIAGNOSIS — F329 Major depressive disorder, single episode, unspecified: Secondary | ICD-10-CM

## 2019-06-17 DIAGNOSIS — F419 Anxiety disorder, unspecified: Secondary | ICD-10-CM

## 2019-06-17 DIAGNOSIS — M797 Fibromyalgia: Secondary | ICD-10-CM

## 2019-06-17 DIAGNOSIS — R682 Dry mouth, unspecified: Secondary | ICD-10-CM

## 2019-06-17 DIAGNOSIS — H04129 Dry eye syndrome of unspecified lacrimal gland: Secondary | ICD-10-CM

## 2019-06-17 DIAGNOSIS — F32A Depression, unspecified: Secondary | ICD-10-CM

## 2019-06-17 NOTE — Telephone Encounter (Signed)
Caren Griffins, a nurse with [facility] calling to answer any questions you may run across in reference to patient's infusions, and prior authorization for the upcoming year.

## 2019-06-17 NOTE — Telephone Encounter (Signed)
[facility] infusion is handling the patient's prior authorization. It is currently pending.   Thanks!

## 2019-06-17 NOTE — Telephone Encounter (Signed)
Called to check status of patient's referral. Per rep Delana Meyer, Referral was received and they are waiting authorization through the insurance plan.  [facility]# [phone]  9:17 AM Beatriz Chancellor, CPhT

## 2019-06-24 NOTE — Telephone Encounter (Signed)
Called [facility] to check on status of patient. Per rep Anne Ng, They are waiting authorization through the insurance plan  [facility]# [phone]

## 2019-07-01 NOTE — Telephone Encounter (Signed)
Received fax from [facility] stating patient was scheduled for first infusion on 07/07/2018.  Nothing further needed at this time.  Closing encounter.  Mariella Saa, PharmD, [place], [place] Clinical Specialty Pharmacist [phone]  07/01/2019 9:42 AM

## 2019-07-01 NOTE — Telephone Encounter (Signed)
Called [facility] to check status of patient's referral. Rep Rosanne Sack advised patient has been scheduled on 07/08/19 for her 1st infusion with them.  [facility]# [phone]

## 2019-07-02 ENCOUNTER — Encounter: Payer: Self-pay | Admitting: Rheumatology

## 2019-07-11 ENCOUNTER — Telehealth: Payer: Self-pay | Admitting: Pharmacy Technician

## 2019-07-11 NOTE — Telephone Encounter (Signed)
Received inquiry from [facility] RN stating patient is receiving treatment at [facility] and plan will no longer allow. No record of patient ever receiving any treatments at [facility]. Patient is currently receiving infusions via [facility]. [facility] has already received insurance approval and started servicing. Called numbers provided and left message for return call. [phone] and [phone]  3:23 PM Beatriz Chancellor, CPhT

## 2019-07-17 ENCOUNTER — Ambulatory Visit: Payer: Self-pay | Admitting: Neurology

## 2019-07-17 ENCOUNTER — Telehealth: Payer: Self-pay | Admitting: Pharmacist

## 2019-07-17 DIAGNOSIS — M0609 Rheumatoid arthritis without rheumatoid factor, multiple sites: Secondary | ICD-10-CM

## 2019-07-17 NOTE — Telephone Encounter (Signed)
Received fax from [facility] infusion stating patient received first Remicade infusion without issue on 07/08/2019.  She was given Remicade 480 mg IV.    Her next appointment is scheduled on 08/19/2019.   We will send document to scan center.   Mariella Saa, PharmD, [place], [place] Clinical Specialty Pharmacist [phone]  07/17/2019 10:34 AM

## 2019-07-30 ENCOUNTER — Encounter: Payer: Self-pay | Admitting: Neurology

## 2019-07-30 ENCOUNTER — Other Ambulatory Visit: Payer: Self-pay

## 2019-07-30 ENCOUNTER — Ambulatory Visit: Payer: 59 | Admitting: Neurology

## 2019-07-30 VITALS — BP 133/77 | HR 81 | Temp 97.0°F | Ht 66.0 in | Wt 174.0 lb

## 2019-07-30 DIAGNOSIS — G243 Spasmodic torticollis: Secondary | ICD-10-CM | POA: Diagnosis not present

## 2019-07-30 NOTE — Progress Notes (Signed)
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  Symptom onset was without apparent triggering event, she has gradually developed this neck pulling,  manifested primarily with right laterocollis, mild retrocolis, and left shoulder elevation since 2000. She has good relief with regular Botox injection, about 4 times a year since 2003. Last injection was February 18, 2010 by Dr. Marta Antu,  She reported great relief as usual, taking 4- 5 days to get symptom relieve, lasting about 3 months.  I began to inject her since 12.2011, every 3-4 months, dosage has been limited to 150 units of BOTOX A.  She denies neck pain, gait difficulty, but with wearing off Botox, she noticed increased head tremor, and pulling towards her right shoulder.  She developed  transient difficulty lifting her neck up when using BOTOX A 200 units previously, reponding well to decreased dose of 150 units  She was enrolled into IPSEN dysport study, first injection was in April 30th 2013, she later enrolled into open label, 2nd injection was in  May 28th 2013. She did very well.  Recent few weeks,  She had experienced flareup of her rheumatoid arthritis, has received IV infusion. Last injection was in 06/10/2012,  500 units of dysport was dissolved into 2 cc of normal saline. Right longissimus capitus 0.5 cc Right splenius capitis 0.5 cc Right levator scapular 0.5 cc Right iliocostalis 0.5 cc   Dysport research study has completed, she is now coming back for repeat injection, she noticed head bobbing over the past few weeks, she denies significant neck pain, weakness,  Her rheumatoid arthritis is under better control   Last injection was in Sep 2014, she did very well, no neck extension weakness, only rarely neck shaking  UPDATE March 8th 2016: She has lost follow-up since her last  visit December 2014, She came in for treatment her cervical dystonia, she feel the tension of her neck all the time, stiffness. Denied gait difficulty, last injection was September 2014, she received 500 units of Dysport, works well for her, she is continue on medication for anxiety, rheumatoid arthritis She also complains of nighttime snoring, frequent awakening, excessive daytime sleepiness, fatigue, today's ESS is 6, FSS was 19,  UPDATE October 13 2014:   She has missed her scheduled sleep study, continue have worsening neck posturing, posterior neck pain, excessive daytime fatigue and sleepiness  UPDATE Nov 11 2014: She came in for EMG guided Dysport injection today, last injection was December 2014, she complains of worsening head titubation, bilateral hands mild postural tremor, posterior neck pain,  Update February 17 2015 She responded very well to last EMG guided Dysport injection Nov 11 2014, no significant head titubation, no significant neck pain  Update July 22 2015: Last injection was August, now she noticed returning of neck pulling and shaking, posterior neck muscle achy pain  Update October 21 2015: Last EMG guided Dysport injection was in January, she responded very well, only recent couple weeks, she noticed returning of neck pulling, shaking again, She has worsening rheumatoid arthritis, is receiving more frequent treatment, complains fatigue, diffuse body achy pain  Update March 15 2016: She responded very well to previous Dysport injection in April, no significant side effect noticed, only recent few weeks, 5 months from previous injection, she noticed return of head shaking neck muscle tightness.  UPDATE Dec 14th 2017: She responded very well  to previous injection in Sept 2017, no significant side effect noticed.  Update September 13 2016: She did well this last injection in December  Update January 15 2017: She noticed worsening head tremor, she had excessive stress,  her sister passed away from lung cancer in April 2018,  UPDATE Apr 25 2017: She responded very well with previous injection, barely has noticeable head shaking.  Update August 01, 2017: She responded very well to previous dysport injection, only recently began to have recurrent head shaking  UPDATE December 19 2017: She responded well to previous injection  UPDATE Sept 25 2019: She responded very well to previous injection  REVIEW OF SYSTEMS: Full 14 system review of systems performed and notable only for ear pain, joint pain, joint swelling, achy muscles, muscle cramps, rash, tremors, depression, anxiety  ALLERGIES: No Known Allergies  HOME MEDICATIONS: Current Outpatient Medications  Medication Sig Dispense Refill  . acetaminophen (TYLENOL) 325 MG tablet Take 650 mg by mouth once. One hour prior to infusion    . baclofen (LIORESAL) 10 MG tablet Take 10 mg by mouth daily as needed for muscle spasms.     Marland Kitchen BIOTIN PO Take by mouth daily.    . Calcium Citrate-Vitamin D (CALCIUM + D PO) Take 1,000 mg by mouth daily.    . clonazePAM (KLONOPIN) 0.5 MG tablet TAKE 1 TABLET BY MOUTH TWICE DAILY AS NEEDED FOR ANXIETY 60 tablet 5  . diphenhydrAMINE (BENADRYL) 25 mg capsule Take 25 mg by mouth once. One hour prior to infusion    . DULoxetine (CYMBALTA) 60 MG capsule Take 1 capsule (60 mg total) by mouth daily. 30 capsule 2  . DYSPORT 500 units SOLR injection INJECT 500 UNITS  INTRAMUSCULARLY EVERY 3  MONTHS (GIVEN AT  PRESCRIBERS OFFICE, DISCARD UNUSED AFTER 1ST USE) 1 each 3  . folic acid (FOLVITE) 1 MG tablet TAKE 1 TABLET(1 MG) BY MOUTH DAILY 90 tablet 4  . inFLIXimab (REMICADE) 100 MG injection Inject 6 mg/kg into the vein every 6 (six) weeks. Infuse 500 mg/ 5 vials in 257mL NS over 2hours every 6 weeks    . methotrexate 50 MG/2ML injection INJECT 0.3 ML INTO THE SKIN ONCE A WEEK 4 mL 0  . Multiple Vitamins-Minerals (MULTIVITAMINS THER. W/MINERALS) TABS Take 1 tablet by mouth daily.    Marland Kitchen  omeprazole (PRILOSEC) 20 MG capsule TAKE 1 CAPSULE BY MOUTH EVERY DAY 90 capsule 3  . traMADol (ULTRAM) 50 MG tablet Take 1 - 2 every 8 hours as needed for pain. 30 tablet 0  . TUBERCULIN SYR 1CC/27GX1/2" (B-D TB SYRINGE 1CC/27GX1/2") 27G X 1/2" 1 ML MISC USE ONE SYRINGE ONCE WEEKLY AS DIRECTED 4 each 11  . TURMERIC PO Take by mouth daily.    . Xylitol (XYLIMELTS MT) Use as directed in the mouth or throat.     No current facility-administered medications for this visit.    PAST MEDICAL HISTORY: Past Medical History:  Diagnosis Date  . Anxiety   . Cervical dystonia   . Depression   . Fibromyalgia   . GERD (gastroesophageal reflux disease)   . History of shingles   . RA (rheumatoid arthritis) ([place])   . Synovitis of hand     PAST SURGICAL HISTORY: Past Surgical History:  Procedure Laterality Date  . TONSILLECTOMY    . VAGINAL HYSTERECTOMY  3/08    FAMILY HISTORY: Family History  Problem Relation Age of Onset  . Arthritis Mother   . Heart Problems Father   . Heart  disease Father   . Heart disease Paternal Uncle        MI    SOCIAL HISTORY:  Social History   Socioeconomic History  . Marital status: Married    Spouse name: [name]  . Number of children: 2  . Years of education: 70  . Highest education level: Not on file  Occupational History    Employer: [employer]  Tobacco Use  . Smoking status: Former Smoker    Packs/day: 0.50    Years: 30.00    Pack years: 15.00    Types: Cigarettes    Quit date: 05/02/2015    Years since quitting: 4.2  . Smokeless tobacco: Never Used  Substance and Sexual Activity  . Alcohol use: Yes    Alcohol/week: 0.0 standard drinks    Comment: Rare  . Drug use: Never  . Sexual activity: Yes    Birth control/protection: Surgical    Comment: 1st intercourse 59 yo-Fewer than 5 partners  Other Topics Concern  . 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 Right handed.    Caffeine- 4 cups daily.   Social Determinants of Health   Financial Resource Strain:   . Difficulty of Paying Living Expenses: Not on file  Food Insecurity:   . Worried About Charity fundraiser in the Last Year: Not on file  . Ran Out of Food in the Last Year: Not on file  Transportation Needs:   . Lack of Transportation (Medical): Not on file  . Lack of Transportation (Non-Medical): Not on file  Physical Activity:   . Days of Exercise per Week: Not on file  . Minutes of Exercise per Session: Not on file  Stress:   . Feeling of Stress : Not on file  Social Connections:   . Frequency of Communication with Friends and Family: Not on file  . Frequency of Social Gatherings with Friends and Family: Not on file  . Attends Religious Services: Not on file  . Active Member of Clubs or Organizations: Not on file  . Attends Archivist Meetings: Not on file  . Marital Status: Not on file  Intimate Partner Violence:   . Fear of Current or Ex-Partner: Not on file  . Emotionally Abused: Not on file  . Physically Abused: Not on file  . Sexually Abused: Not on file     PHYSICAL EXAM   Vitals:   07/30/19 1331  BP: 133/77  Pulse: 81  Temp: (!) 97 F (36.1 C)  Weight: 174 lb (78.9 kg)  Height: 5\' 6"  (1.676 m)    Not recorded      Body mass index is 28.08 kg/m.  PHYSICAL EXAMNIATION: MENTAL STATUS: Speech is normal, normal to casual conversation, and history taking  CRANIAL NERVES: mild retrocollis, right tilt, mild right lateral shift, slight left shoulder elevation  MOTOR: Normal tone and bulk and strength COORDINATION: No dysmetria   GAIT/STANCE: Posture is normal.  DIAGNOSTIC DATA (LABS, IMAGING, TESTING) - I reviewed patient records, labs, notes, testing and imaging myself where available.  ASSESSMENT AND PLAN  Adaleyza Jaster is a 59 y.o. female with long-standing history of  cervical dystonia, responded very well to previous EMG guided Dysport injection, last injection was May 2016, responded very well.   Mild retrocollis, right tilt, mild right lateral shift, slight left shoulder elevation  500 of Dysport was dissolved  into 2.5 cc of normal saline,  Right longissimus capitus 0. 5 cc Left splenius capitis 0.5 cc Right inferior oblique capitis, 0.5 cc  Left inferior oblique capitis 0.5 cc Left levator scapular 0.5   Patient tolerate the injection well, will return to clinic in 3 months for repeat injection Please dissolve 500 units of Dysport into 2.5 cc of normal saline   Marcial Pacas, M.D. Ph.D.  [facility] 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  Symptom onset was without apparent triggering event, she has gradually developed this neck pulling,  manifested primarily with right laterocollis, mild retrocolis, and left shoulder elevation since 2000. She has good relief with regular Botox injection, about 4 times a year since 2003. Last injection was February 18, 2010 by Dr. Marta Antu,  She reported great relief as usual, taking 4- 5 days to get symptom relieve, lasting about 3 months.  I began to inject her since 12.2011, every 3-4 months, dosage has been limited to 150 units of BOTOX A.  She denies neck pain, gait difficulty, but with wearing off Botox, she noticed increased head tremor, and pulling towards her right shoulder.  She developed  transient difficulty lifting her neck up when using BOTOX A 200 units previously, reponding well to decreased dose of 150 units  She was enrolled into IPSEN dysport study, first injection was in April 30th 2013, she later enrolled into open label, 2nd  injection was in  May 28th 2013. She did very well.  Recent few weeks,  She had experienced flareup of her rheumatoid arthritis, has received IV infusion. Last injection was in 06/10/2012,  500 units of dysport was dissolved into 2 cc of normal saline. Right longissimus capitus 0.5 cc Right splenius capitis 0.5 cc Right levator scapular 0.5 cc Right iliocostalis 0.5 cc   Dysport research study has completed, she is now coming back for repeat injection, she noticed head bobbing over the past few weeks, she denies significant neck pain, weakness,  Her rheumatoid arthritis is under better control   Last injection was in Sep 2014, she did very well, no neck extension weakness, only rarely neck shaking  UPDATE March 8th 2016: She has lost follow-up since her last visit December 2014, She came in for treatment her cervical dystonia, she feel the tension of her neck all the time, stiffness. Denied gait difficulty, last injection was September 2014, she received 500 units of Dysport, works well for her, she is continue on medication for anxiety, rheumatoid arthritis She also complains of nighttime snoring, frequent awakening, excessive daytime sleepiness, fatigue, today's ESS is 6, FSS was 78,  UPDATE October 13 2014:   She has missed her scheduled sleep study, continue have worsening neck posturing, posterior neck pain, excessive daytime fatigue and sleepiness  UPDATE Nov 11 2014: She came in for EMG guided Dysport injection today, last injection was December 2014, she complains of worsening head titubation, bilateral hands mild postural tremor, posterior neck pain,  Update February 17 2015 She responded very well to last EMG guided Dysport injection Nov 11 2014, no significant head titubation, no significant neck pain  Update July 22 2015: Last injection was August, now she noticed returning of neck pulling and shaking, posterior neck muscle achy pain  Update  October 21 2015: Last EMG guided Dysport  injection was in January, she responded very well, only recent couple weeks, she noticed returning of neck pulling, shaking again, She has worsening rheumatoid arthritis, is receiving more frequent treatment, complains fatigue, diffuse body achy pain  Update March 15 2016: She responded very well to previous Dysport injection in April, no significant side effect noticed, only recent few weeks, 5 months from previous injection, she noticed return of head shaking neck muscle tightness.  UPDATE Dec 14th 2017: She responded very well to previous injection in Sept 2017, no significant side effect noticed.  Update September 13 2016: She did well this last injection in December  Update January 15 2017: She noticed worsening head tremor, she had excessive stress, her sister passed away from lung cancer in April 2018,  UPDATE Apr 25 2017: She responded very well with previous injection, barely has noticeable head shaking.  Update August 01, 2017: She responded very well to previous dysport injection, only recently began to have recurrent head shaking  UPDATE December 19 2017: She responded well to previous injection  UPDATE Jun 20 2018: She did very well with previous injection  UPDATE October 01 2018: She did well to previous injection  UPDATE January 01 2019: She did well with previous injection  UPDATE Oct 28th 2020: She did well to previous injections  UPDATE Jan 27th 2021: She did well to previous injections  REVIEW OF SYSTEMS: Full 14 system review of systems performed and notable only for as above.  All rest review of system were negative.  ALLERGIES: No Known Allergies  HOME MEDICATIONS: Current Outpatient Medications  Medication Sig Dispense Refill  . acetaminophen (TYLENOL) 325 MG tablet Take 650 mg by mouth once. One hour prior to infusion    . baclofen (LIORESAL) 10 MG tablet Take 10 mg by mouth daily as needed for muscle spasms.     Marland Kitchen BIOTIN PO Take by mouth daily.    .  Calcium Citrate-Vitamin D (CALCIUM + D PO) Take 1,000 mg by mouth daily.    . clonazePAM (KLONOPIN) 0.5 MG tablet TAKE 1 TABLET BY MOUTH TWICE DAILY AS NEEDED FOR ANXIETY 60 tablet 5  . diphenhydrAMINE (BENADRYL) 25 mg capsule Take 25 mg by mouth once. One hour prior to infusion    . DULoxetine (CYMBALTA) 60 MG capsule Take 1 capsule (60 mg total) by mouth daily. 30 capsule 2  . DYSPORT 500 units SOLR injection INJECT 500 UNITS  INTRAMUSCULARLY EVERY 3  MONTHS (GIVEN AT  PRESCRIBERS OFFICE, DISCARD UNUSED AFTER 1ST USE) 1 each 3  . folic acid (FOLVITE) 1 MG tablet TAKE 1 TABLET(1 MG) BY MOUTH DAILY 90 tablet 4  . inFLIXimab (REMICADE) 100 MG injection Inject 6 mg/kg into the vein every 6 (six) weeks. Infuse 500 mg/ 5 vials in 275mL NS over 2hours every 6 weeks    . methotrexate 50 MG/2ML injection INJECT 0.3 ML INTO THE SKIN ONCE A WEEK 4 mL 0  . Multiple Vitamins-Minerals (MULTIVITAMINS THER. W/MINERALS) TABS Take 1 tablet by mouth daily.    Marland Kitchen omeprazole (PRILOSEC) 20 MG capsule TAKE 1 CAPSULE BY MOUTH EVERY DAY 90 capsule 3  . traMADol (ULTRAM) 50 MG tablet Take 1 - 2 every 8 hours as needed for pain. 30 tablet 0  . TUBERCULIN SYR 1CC/27GX1/2" (B-D TB SYRINGE 1CC/27GX1/2") 27G X 1/2" 1 ML MISC USE ONE SYRINGE ONCE WEEKLY AS DIRECTED 4 each 11  . TURMERIC PO Take by mouth daily.    Marland Kitchen  Xylitol (XYLIMELTS MT) Use as directed in the mouth or throat.     No current facility-administered medications for this visit.    PAST MEDICAL HISTORY: Past Medical History:  Diagnosis Date  . Anxiety   . Cervical dystonia   . Depression   . Fibromyalgia   . GERD (gastroesophageal reflux disease)   . History of shingles   . RA (rheumatoid arthritis) ([place])   . Synovitis of hand     PAST SURGICAL HISTORY: Past Surgical History:  Procedure Laterality Date  . TONSILLECTOMY    . VAGINAL HYSTERECTOMY  3/08    FAMILY HISTORY: Family History  Problem Relation Age of Onset  . Arthritis Mother   . Heart  Problems Father   . Heart disease Father   . Heart disease Paternal Uncle        MI    SOCIAL HISTORY:  Social History   Socioeconomic History  . Marital status: Married    Spouse name: [name]  . Number of children: 2  . Years of education: 89  . Highest education level: Not on file  Occupational History    Employer: [employer]  Tobacco Use  . Smoking status: Former Smoker    Packs/day: 0.50    Years: 30.00    Pack years: 15.00    Types: Cigarettes    Quit date: 05/02/2015    Years since quitting: 4.2  . Smokeless tobacco: Never Used  Substance and Sexual Activity  . Alcohol use: Yes    Alcohol/week: 0.0 standard drinks    Comment: Rare  . Drug use: Never  . Sexual activity: Yes    Birth control/protection: Surgical    Comment: 1st intercourse 59 yo-Fewer than 5 partners  Other Topics Concern  . 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 Right handed.    Caffeine- 4 cups daily.   Social Determinants of Health   Financial Resource Strain:   . Difficulty of Paying Living Expenses: Not on file  Food Insecurity:   . Worried About Charity fundraiser in the Last Year: Not on file  . Ran Out of Food in the Last Year: Not on file  Transportation Needs:   . Lack of Transportation (Medical): Not on file  . Lack of Transportation (Non-Medical): Not on file  Physical Activity:   . Days of Exercise per Week: Not on file  . Minutes of Exercise per Session: Not on file  Stress:   . Feeling of Stress : Not on file  Social Connections:   . Frequency of Communication with Friends and Family: Not on file  . Frequency of Social Gatherings with Friends and Family: Not on file  . Attends Religious Services: Not on file  . Active Member of Clubs or Organizations: Not on file  . Attends Archivist Meetings: Not on file  . Marital Status: Not on  file  Intimate Partner Violence:   . Fear of Current or Ex-Partner: Not on file  . Emotionally Abused: Not on file  . Physically Abused: Not on file  . Sexually Abused: Not on file     PHYSICAL EXAM   Vitals:   07/30/19 1331  BP: 133/77  Pulse: 81  Temp: (!) 97 F (36.1 C)  Weight: 174 lb (78.9 kg)  Height: 5\' 6"  (1.676 m)    Not recorded  Body mass index is 28.08 kg/m.  PHYSICAL EXAMNIATION:   Mild to moderate retrocollis, right tilt, mild right lateral shift, she has moderate tenderness at left levator scapular muscle   ASSESSMENT AND PLAN  Dollie Waldoch is a 59 y.o. female with long-standing history of cervical dystonia, responded very well to previous EMG guided Dysport injection, last injection was May 2016, responded very well.   Mild retrocollis, right tilt, mild right lateral shift, slight left shoulder elevation  500 of Dysport was dissolved into 2.5 cc of normal saline,  Right longissimus capitus 0. 25 cc Right splenius capitis 0.25 c Left inferior oblique capitis 0.5 cc Left splenius capitus 0.25 cc Left longissimus capitis 0.5 cc Left levator scapular 0.75   Patient tolerate the injection well, will return to clinic in 3 months for repeat injection Please dissolve 500 units of Dysport into 2.5 cc of normal saline   Marcial Pacas, M.D. Ph.D.  [facility] [address] [place], [place] [address] Ph: [phone] Fax: [phone]

## 2019-07-30 NOTE — Progress Notes (Signed)
**  Dysport 500 units x 1 vial, NDC [id number], Lot [id number], Exp 03/02/2020, specialty pharmacy.//mck,rn**

## 2019-08-19 ENCOUNTER — Other Ambulatory Visit: Payer: Self-pay | Admitting: Rheumatology

## 2019-08-19 ENCOUNTER — Telehealth: Payer: Self-pay | Admitting: Pharmacist

## 2019-08-19 NOTE — Telephone Encounter (Signed)
Received fax from [facility] infusion verifying office that patient received 480 mg of Remicade on 08/19/2019 without issue.  Next infusion schedule for 09/30/2019.   Mariella Saa, PharmD, [place], [place] [facility] Pharmacist [phone]  08/19/2019 12:11 PM

## 2019-08-19 NOTE — Telephone Encounter (Signed)
Last Visit: 06/17/2019 telemedicine  Next Visit: 10/20/2019  Okay to refill per Dr. Estanislado Pandy.

## 2019-09-12 ENCOUNTER — Other Ambulatory Visit: Payer: Self-pay | Admitting: Rheumatology

## 2019-09-12 NOTE — Telephone Encounter (Addendum)
Last Visit: 06/17/2019 telemedicine  Next Visit: 10/20/2019 Labs: 07/09/2019 Glucose 101, WBC 10.9   Per last office note on 08/19/19 dose is: MTX 0.3 ml sq once weekly  Okay to refill per Dr. Estanislado Pandy

## 2019-10-10 NOTE — Progress Notes (Signed)
Office Visit Note  Patient: Courtney Myers             Date of Birth: 1961/02/04           MRN: [id number]             PCP: Susy Frizzle, MD Referring: Susy Frizzle, MD Visit Date: 10/20/2019 Occupation: @GUAROCC @  Subjective:  Pain in both wrist joints   History of Present Illness: Courtney Myers is a 59 y.o. female with history of seropositive rheumatoid arthritis and fibromyalgia.  Her last remicade 480 mg IV infusion was on 09/30/19 at [facility].  She takes MTX 0.3 ml sq once weekly and folic acid 1 mg po daily.  She is on the reduced dose of MTX due to history of elevated LFTs.  She states she has been experiencing intermittent pain and inflammation in both wrist joints.  She continues to have morning stiffness for about 15 minutes daily.   Activities of Daily Living:  Patient reports morning stiffness for 15 minutes.   Patient Denies nocturnal pain.  Difficulty dressing/grooming: Denies Difficulty climbing stairs: Denies Difficulty getting out of chair: Denies Difficulty using hands for taps, buttons, cutlery, and/or writing: Denies  Review of Systems  Constitutional: Positive for fatigue.  HENT: Positive for mouth dryness. Negative for mouth sores and nose dryness.   Eyes: Negative for pain, visual disturbance and dryness.  Respiratory: Negative for cough, hemoptysis, shortness of breath and difficulty breathing.   Cardiovascular: Positive for swelling in legs/feet. Negative for chest pain, palpitations and hypertension.  Gastrointestinal: Negative for blood in stool, constipation and diarrhea.  Endocrine: Negative for increased urination.  Genitourinary: Negative for difficulty urinating and painful urination.  Musculoskeletal: Positive for arthralgias, joint pain, joint swelling, muscle weakness, morning stiffness and muscle tenderness. Negative for myalgias and myalgias.  Skin: Negative for color change, pallor, rash, hair loss,  nodules/bumps, skin tightness, ulcers and sensitivity to sunlight.  Allergic/Immunologic: Negative for susceptible to infections.  Neurological: Negative for dizziness, numbness, headaches and weakness.  Hematological: Negative for bruising/bleeding tendency and swollen glands.  Psychiatric/Behavioral: Positive for sleep disturbance. Negative for depressed mood. The patient is not nervous/anxious.     PMFS History:  Patient Active Problem List   Diagnosis Date Noted  . Excessive sleepiness 09/08/2014  . Fibromyalgia syndrome 08/25/2014  . RA (rheumatoid arthritis) ([place])   . GERD (gastroesophageal reflux disease)   . Anxiety   . Depression   . History of shingles   . Synovitis of hand   . Cervical dystonia 12/04/2012    Past Medical History:  Diagnosis Date  . Anxiety   . Cervical dystonia   . Depression   . Fibromyalgia   . GERD (gastroesophageal reflux disease)   . History of shingles   . RA (rheumatoid arthritis) ([place])   . Synovitis of hand     Family History  Problem Relation Age of Onset  . Arthritis Mother   . Heart Problems Father   . Heart disease Father   . Heart disease Paternal Uncle        MI   Past Surgical History:  Procedure Laterality Date  . TONSILLECTOMY    . 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 Right handed.    Caffeine- 4 cups daily.   Immunization History  Administered Date(s) Administered  .  Influenza,inj,Quad PF,6+ Mos 05/25/2018, 04/03/2019  . Influenza-Unspecified 06/24/2014, 05/01/2015, 04/10/2016, 04/06/2017  . Pneumococcal Conjugate-13 05/01/2015  . Pneumococcal Polysaccharide-23 04/10/2016  . Tdap 09/09/2015     Objective: Vital Signs: BP 106/66 (BP Location: Left Arm, Patient Position: Sitting, Cuff Size: Normal)   Pulse 80   Resp 16   Ht 5\' 7"  (1.702 m)   Wt 178 lb 9.6  oz (81 kg)   BMI 27.97 kg/m    Physical Exam Vitals and nursing note reviewed.  Constitutional:      Appearance: She is well-developed.  HENT:     Head: Normocephalic and atraumatic.  Eyes:     Conjunctiva/sclera: Conjunctivae normal.  Pulmonary:     Effort: Pulmonary effort is normal.  Abdominal:     General: Bowel sounds are normal.     Palpations: Abdomen is soft.  Musculoskeletal:     Cervical back: Normal range of motion.  Lymphadenopathy:     Cervical: No cervical adenopathy.  Skin:    General: Skin is warm and dry.     Capillary Refill: Capillary refill takes less than 2 seconds.  Neurological:     Mental Status: She is alert and oriented to person, place, and time.  Psychiatric:        Behavior: Behavior normal.      Musculoskeletal Exam: C-spine, thoracic spine, and lumbar spine good ROM.  No midline spinal tenderness.  Shoulder joints, elbow joints, wrist joints, MCPs, PIPs, and DIPs good ROM with no synovitis.  Complete fist formation bilaterally. Hip joints, knee joints, ankle joints, MTPs, PIPs, and DIPs good ROM with no synovitis.  No warmth or effusion of knee joints.  No tenderness or swelling of ankle joints.    CDAI Exam: CDAI Score: 0.6  Patient Global: 4 mm; Provider Global: 2 mm Swollen: 0 ; Tender: 0  Joint Exam 10/20/2019   No joint exam has been documented for this visit   There is currently no information documented on the homunculus. Go to the Rheumatology activity and complete the homunculus joint exam.  Investigation: No additional findings.  Imaging: No results found.  Recent Labs: Lab Results  Component Value Date   WBC 7.8 05/08/2019   HGB 11.9 05/08/2019   PLT 275 05/08/2019   NA 138 04/03/2019   K 3.9 04/03/2019   CL 105 04/03/2019   CO2 26 04/03/2019   GLUCOSE 102 (H) 04/03/2019   BUN 12 04/03/2019   CREATININE 0.87 04/03/2019   BILITOT 0.3 04/03/2019   ALKPHOS 71 03/13/2019   AST 18 04/03/2019   ALT 13 04/03/2019    PROT 6.6 04/03/2019   ALBUMIN 3.6 03/13/2019   CALCIUM 9.1 04/03/2019   GFRAA 84 04/03/2019   QFTBGOLD Negative 09/14/2015   QFTBGOLDPLUS Negative 01/30/2019    Speciality Comments: Remicade 6mg /kg every 6 weeks  TB gold negative 10/27/16  Procedures:  No procedures performed Allergies: Patient has no known allergies.   Assessment / Plan:     Visit Diagnoses: Rheumatoid arthritis of multiple sites with negative rheumatoid factor ([place]): She has no synovitis or tenderness on exam.  She has been experiencing recurrent flares in both wrist joints.  She has no tenderness or inflammation on exam today.  According to the patient she has breakthrough pain and inflammation in both wrists and between Remicade infusions.  She is on Remicade 6 mg/kg grams every 6 weeks,  methotrexate 0.3 ml sq once weekly and folic acid 1 mg po daily.  Her last infusion of Remicade at  [facility] inf[facility] was on 09/30/2019.  We discussed increasing the dose of methotrexate to 0.4 mL sq injections once weekly.  Refill folic acid was sent to the pharmacy today.  She will continue on Remicade 6 mg/kg IV infusions every 6 weeks.  She was advised to notify us if she continues to have recurrent flares.  She will follow-up in the office in 5 months.  High risk medication use - Remicade infusion 6 mg/kg every 6 weeks, methotrexate 0.4 mL sq every 7 days, and folic acid 1 mg 1 tablet daily.  Last TB gold negative on 01/30/19 and will monitor yearly.  Future order for TB gold was placed today.  She has CBC and CMP drawn with her infusions.- Plan: QuantiFERON-TB Gold Plus  Fibromyalgia: She continues to have generalized muscle aches and muscle tenderness due to fibromyalgia.  She experiences chronic fatigue secondary to insomnia.  She continues to take Cymbalta 60 mg 1 capsule by mouth daily and tramadol as needed for pain relief.  She also takes baclofen 10 mg 1 tablet by mouth as needed for muscle spasms.  Chronic fatigue: Chronic  fatigue which has been stable.  Primary insomnia: Chronic  Other medical conditions are listed as follows:  History of gastroesophageal reflux (GERD)  Anxiety and depression  Recurrent UTI  Orders: Orders Placed This Encounter  Procedures  . QuantiFERON-TB Gold Plus   Meds ordered this encounter  Medications  . folic acid (FOLVITE) 1 MG tablet    Sig: TAKE 1 TABLET(1 MG) BY MOUTH DAILY    Dispense:  90 tablet    Refill:  4  . methotrexate 50 MG/2ML injection    Sig: INJECT 0.4 ML INTO THE SKIN ONCE A WEEK.    Dispense:  6 mL    Refill:  0      Follow-Up Instructions: Return in about 5 months (around 03/21/2020) for Rheumatoid arthritis, Fibromyalgia.   Hazel Sams, PA-C  I examined and evaluated the patient with Hazel Sams PA.  She complains of intermittent increased pain and swelling in her joints.  No synovitis was noted on my examination today.  We will continue current regimen.  She was advised to contact us in case she has increased swelling so she can be examined during the flare.  The plan of care was discussed as noted above.  Bo Merino, MD  Note - This record has been created using Editor, commissioning.  Chart creation errors have been sought, but may not always  have been located. Such creation errors do not reflect on  the standard of medical care.

## 2019-10-14 ENCOUNTER — Other Ambulatory Visit: Payer: Self-pay | Admitting: *Deleted

## 2019-10-14 MED ORDER — CLONAZEPAM 0.5 MG PO TABS: ORAL_TABLET | ORAL | 5 refills | Status: DC

## 2019-10-15 ENCOUNTER — Other Ambulatory Visit: Payer: Self-pay | Admitting: *Deleted

## 2019-10-15 DIAGNOSIS — F17210 Nicotine dependence, cigarettes, uncomplicated: Secondary | ICD-10-CM

## 2019-10-20 ENCOUNTER — Other Ambulatory Visit: Payer: Self-pay

## 2019-10-20 ENCOUNTER — Encounter: Payer: Self-pay | Admitting: Physician Assistant

## 2019-10-20 ENCOUNTER — Telehealth: Payer: Self-pay | Admitting: *Deleted

## 2019-10-20 ENCOUNTER — Ambulatory Visit: Payer: 59 | Admitting: Rheumatology

## 2019-10-20 VITALS — BP 106/66 | HR 80 | Resp 16 | Ht 67.0 in | Wt 178.6 lb

## 2019-10-20 DIAGNOSIS — F5101 Primary insomnia: Secondary | ICD-10-CM

## 2019-10-20 DIAGNOSIS — F329 Major depressive disorder, single episode, unspecified: Secondary | ICD-10-CM

## 2019-10-20 DIAGNOSIS — F419 Anxiety disorder, unspecified: Secondary | ICD-10-CM

## 2019-10-20 DIAGNOSIS — M797 Fibromyalgia: Secondary | ICD-10-CM | POA: Diagnosis not present

## 2019-10-20 DIAGNOSIS — Z79899 Other long term (current) drug therapy: Secondary | ICD-10-CM

## 2019-10-20 DIAGNOSIS — R5382 Chronic fatigue, unspecified: Secondary | ICD-10-CM | POA: Diagnosis not present

## 2019-10-20 DIAGNOSIS — N390 Urinary tract infection, site not specified: Secondary | ICD-10-CM

## 2019-10-20 DIAGNOSIS — M0609 Rheumatoid arthritis without rheumatoid factor, multiple sites: Secondary | ICD-10-CM

## 2019-10-20 DIAGNOSIS — F32A Depression, unspecified: Secondary | ICD-10-CM

## 2019-10-20 DIAGNOSIS — Z8719 Personal history of other diseases of the digestive system: Secondary | ICD-10-CM

## 2019-10-20 MED ORDER — FOLIC ACID 1 MG PO TABS: ORAL_TABLET | ORAL | 4 refills | Status: DC

## 2019-10-20 MED ORDER — METHOTREXATE SODIUM CHEMO INJECTION 50 MG/2ML: INTRAMUSCULAR | 0 refills | Status: DC

## 2019-10-20 NOTE — Telephone Encounter (Signed)
I called [employer] [phone] and spoke to Melody.  She states that [id number] and [phone] are valid, billable and do not require PA.  States that records do need to be attached to the claim when it is filed for [phone].  Ref# for this call is [id number].  I called [facility] [phone] to schedule medication delivery.  I spoke to [facility] who states it needs benefit review and patient consent. Once they obtain consent they will call to schedule delivery.  This usually takes 24 hours.  Ref# for call is the order number [id number].

## 2019-10-22 ENCOUNTER — Telehealth: Payer: Self-pay

## 2019-10-22 NOTE — Telephone Encounter (Signed)
I called [facility] [phone] and spoke to Tam to schedule [place] delivery.  She had me hold while she called patient to get consent. She was unable to get an answer but states she left a message requesting a call back to get consent to ship D[place].  I also tried to call patient and got voicemail.  I left a message as well with [facility] number requesting she call them to give consent. Estimated delivery date is 10/28/2019 assuming patient calls with consent.

## 2019-10-22 NOTE — Telephone Encounter (Signed)
Lab results received via fax from [facility].   09/30/2019 CBC and CMP  Glucose 150  Labs reviewed by Hazel Sams, PA-C. Will send document to scan center.   Attempted to contact patient and left message on machine to advise patient of lab results.

## 2019-10-29 ENCOUNTER — Encounter: Payer: Self-pay | Admitting: Neurology

## 2019-10-29 ENCOUNTER — Other Ambulatory Visit: Payer: Self-pay

## 2019-10-29 ENCOUNTER — Ambulatory Visit: Payer: 59 | Admitting: Neurology

## 2019-10-29 VITALS — BP 121/77 | HR 82 | Ht 67.0 in | Wt 179.0 lb

## 2019-10-29 DIAGNOSIS — G243 Spasmodic torticollis: Secondary | ICD-10-CM | POA: Diagnosis not present

## 2019-10-29 NOTE — Progress Notes (Signed)
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  Symptom onset was without apparent triggering event, she has gradually developed this neck pulling,  manifested primarily with right laterocollis, mild retrocolis, and left shoulder elevation since 2000. She has good relief with regular Botox injection, about 4 times a year since 2003. Last injection was February 18, 2010 by Dr. Marta Antu,  She reported great relief as usual, taking 4- 5 days to get symptom relieve, lasting about 3 months.  I began to inject her since 12.2011, every 3-4 months, dosage has been limited to 150 units of BOTOX A.  She denies neck pain, gait difficulty, but with wearing off Botox, she noticed increased head tremor, and pulling towards her right shoulder.  She developed  transient difficulty lifting her neck up when using BOTOX A 200 units previously, reponding well to decreased dose of 150 units  She was enrolled into IPSEN dysport study, first injection was in April 30th 2013, she later enrolled into open label, 2nd injection was in  May 28th 2013. She did very well.  Recent few weeks,  She had experienced flareup of her rheumatoid arthritis, has received IV infusion. Last injection was in 06/10/2012,  500 units of dysport was dissolved into 2 cc of normal saline. Right longissimus capitus 0.5 cc Right splenius capitis 0.5 cc Right levator scapular 0.5 cc Right iliocostalis 0.5 cc   Dysport research study has completed, she is now coming back for repeat injection, she noticed head bobbing over the past few weeks, she denies significant neck pain, weakness,  Her rheumatoid arthritis is under better control   Last injection was in Sep 2014, she did very well, no neck extension weakness, only rarely neck shaking  UPDATE March 8th 2016: She has lost follow-up since her last  visit December 2014, She came in for treatment her cervical dystonia, she feel the tension of her neck all the time, stiffness. Denied gait difficulty, last injection was September 2014, she received 500 units of Dysport, works well for her, she is continue on medication for anxiety, rheumatoid arthritis She also complains of nighttime snoring, frequent awakening, excessive daytime sleepiness, fatigue, today's ESS is 6, FSS was 19,  UPDATE October 13 2014:   She has missed her scheduled sleep study, continue have worsening neck posturing, posterior neck pain, excessive daytime fatigue and sleepiness  UPDATE Nov 11 2014: She came in for EMG guided Dysport injection today, last injection was December 2014, she complains of worsening head titubation, bilateral hands mild postural tremor, posterior neck pain,  Update February 17 2015 She responded very well to last EMG guided Dysport injection Nov 11 2014, no significant head titubation, no significant neck pain  Update July 22 2015: Last injection was August, now she noticed returning of neck pulling and shaking, posterior neck muscle achy pain  Update October 21 2015: Last EMG guided Dysport injection was in January, she responded very well, only recent couple weeks, she noticed returning of neck pulling, shaking again, She has worsening rheumatoid arthritis, is receiving more frequent treatment, complains fatigue, diffuse body achy pain  Update March 15 2016: She responded very well to previous Dysport injection in April, no significant side effect noticed, only recent few weeks, 5 months from previous injection, she noticed return of head shaking neck muscle tightness.  UPDATE Dec 14th 2017: She responded very well  to previous injection in Sept 2017, no significant side effect noticed.  Update September 13 2016: She did well this last injection in December  Update January 15 2017: She noticed worsening head tremor, she had excessive stress,  her sister passed away from lung cancer in April 2018,  UPDATE Apr 25 2017: She responded very well with previous injection, barely has noticeable head shaking.  Update August 01, 2017: She responded very well to previous dysport injection, only recently began to have recurrent head shaking  UPDATE December 19 2017: She responded well to previous injection  UPDATE Sept 25 2019: She responded very well to previous injection  REVIEW OF SYSTEMS: Full 14 system review of systems performed and notable only for ear pain, joint pain, joint swelling, achy muscles, muscle cramps, rash, tremors, depression, anxiety  ALLERGIES: No Known Allergies  HOME MEDICATIONS: Current Outpatient Medications  Medication Sig Dispense Refill  . acetaminophen (TYLENOL) 325 MG tablet Take 650 mg by mouth once. One hour prior to infusion    . baclofen (LIORESAL) 10 MG tablet Take 10 mg by mouth daily as needed for muscle spasms.     Marland Kitchen BIOTIN PO Take by mouth daily.    . Calcium Citrate-Vitamin D (CALCIUM + D PO) Take 1,000 mg by mouth daily.    . clonazePAM (KLONOPIN) 0.5 MG tablet TAKE 1 TABLET BY MOUTH TWICE DAILY AS NEEDED FOR ANXIETY 60 tablet 5  . diphenhydrAMINE (BENADRYL) 25 mg capsule Take 25 mg by mouth once. One hour prior to infusion    . DULoxetine (CYMBALTA) 60 MG capsule TAKE 1 CAPSULE(60 MG) BY MOUTH DAILY 30 capsule 2  . DYSPORT 500 units SOLR injection INJECT 500 UNITS  INTRAMUSCULARLY EVERY 3  MONTHS (GIVEN AT  PRESCRIBERS OFFICE, DISCARD UNUSED AFTER 1ST USE) 1 each 3  . folic acid (FOLVITE) 1 MG tablet TAKE 1 TABLET(1 MG) BY MOUTH DAILY 90 tablet 4  . inFLIXimab (REMICADE) 100 MG injection Inject 6 mg/kg into the vein every 6 (six) weeks. Infuse 500 mg/ 5 vials in 267mL NS over 2hours every 6 weeks    . methotrexate 50 MG/2ML injection INJECT 0.4 ML INTO THE SKIN ONCE A WEEK. 6 mL 0  . Multiple Vitamins-Minerals (MULTIVITAMINS THER. W/MINERALS) TABS Take 1 tablet by mouth daily.    Marland Kitchen omeprazole  (PRILOSEC) 20 MG capsule TAKE 1 CAPSULE BY MOUTH EVERY DAY 90 capsule 3  . traMADol (ULTRAM) 50 MG tablet Take 1 - 2 every 8 hours as needed for pain. 30 tablet 0  . TUBERCULIN SYR 1CC/27GX1/2" (B-D TB SYRINGE 1CC/27GX1/2") 27G X 1/2" 1 ML MISC USE ONE SYRINGE ONCE WEEKLY AS DIRECTED 4 each 11  . TURMERIC PO Take by mouth daily.    . Xylitol (XYLIMELTS MT) Use as directed in the mouth or throat.     No current facility-administered medications for this visit.    PAST MEDICAL HISTORY: Past Medical History:  Diagnosis Date  . Anxiety   . Cervical dystonia   . Depression   . Fibromyalgia   . GERD (gastroesophageal reflux disease)   . History of shingles   . RA (rheumatoid arthritis) ([place])   . Synovitis of hand     PAST SURGICAL HISTORY: Past Surgical History:  Procedure Laterality Date  . TONSILLECTOMY    . VAGINAL HYSTERECTOMY  3/08    FAMILY HISTORY: Family History  Problem Relation Age of Onset  . Arthritis Mother   . Heart Problems Father   . Heart disease Father   .  Heart disease Paternal Uncle        MI    SOCIAL HISTORY:  Social History   Socioeconomic History  . Marital status: Married    Spouse name: [name]  . Number of children: 2  . Years of education: 59  . Highest education level: Not on file  Occupational History    Employer: [employer]  Tobacco Use  . Smoking status: Former Smoker    Packs/day: 0.50    Years: 30.00    Pack years: 15.00    Types: Cigarettes    Quit date: 05/02/2015    Years since quitting: 4.4  . Smokeless tobacco: Never Used  Substance and Sexual Activity  . Alcohol use: Yes    Alcohol/week: 0.0 standard drinks    Comment: Rare  . Drug use: Never  . Sexual activity: Yes    Birth control/protection: Surgical    Comment: 1st intercourse 59 yo-Fewer than 5 partners  Other Topics Concern  . 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 Right handed.    Caffeine- 4 cups daily.   Social Determinants of Health   Financial Resource Strain:   . Difficulty of Paying Living Expenses:   Food Insecurity:   . Worried About Charity fundraiser in the Last Year:   . Arboriculturist in the Last Year:   Transportation Needs:   . Film/video editor (Medical):   Marland Kitchen Lack of Transportation (Non-Medical):   Physical Activity:   . Days of Exercise per Week:   . Minutes of Exercise per Session:   Stress:   . Feeling of Stress :   Social Connections:   . Frequency of Communication with Friends and Family:   . Frequency of Social Gatherings with Friends and Family:   . Attends Religious Services:   . Active Member of Clubs or Organizations:   . Attends Archivist Meetings:   Marland Kitchen Marital Status:   Intimate Partner Violence:   . Fear of Current or Ex-Partner:   . Emotionally Abused:   Marland Kitchen Physically Abused:   . Sexually Abused:      PHYSICAL EXAM   Vitals:   10/29/19 1512  BP: 121/77  Pulse: 82  Weight: 179 lb (81.2 kg)  Height: 5\' 7"  (1.702 m)    Not recorded      Body mass index is 28.04 kg/m.  PHYSICAL EXAMNIATION: MENTAL STATUS: Speech is normal, normal to casual conversation, and history taking  CRANIAL NERVES: mild retrocollis, right tilt, mild right lateral shift, slight left shoulder elevation  MOTOR: Normal tone and bulk and strength COORDINATION: No dysmetria   GAIT/STANCE: Posture is normal.  DIAGNOSTIC DATA (LABS, IMAGING, TESTING) - I reviewed patient records, labs, notes, testing and imaging myself where available.  ASSESSMENT AND PLAN  Aislin Loyer is a 59 y.o. female with long-standing history of cervical dystonia, responded very well to previous EMG guided Dysport injection, last injection was May 2016, responded very well.   Mild retrocollis, right tilt, mild right lateral shift, slight left shoulder elevation  500 of Dysport  was dissolved into 2.5 cc of normal saline,  Right longissimus capitus 0. 5 cc Left splenius capitis 0.5 cc Right inferior oblique capitis, 0.5 cc  Left inferior oblique capitis 0.5 cc Left levator scapular 0.5   Patient tolerate the injection well, will return to clinic in 3  months for repeat injection Please dissolve 500 units of Dysport into 2.5 cc of normal saline   Marcial Pacas, M.D. Ph.D.  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  Symptom onset was without apparent triggering event, she has gradually developed this neck pulling,  manifested primarily with right laterocollis, mild retrocolis, and left shoulder elevation since 2000. She has good relief with regular Botox injection, about 4 times a year since 2003. Last injection was February 18, 2010 by Dr. Marta Antu,  She reported great relief as usual, taking 4- 5 days to get symptom relieve, lasting about 3 months.  I began to inject her since 12.2011, every 3-4 months, dosage has been limited to 150 units of BOTOX A.  She denies neck pain, gait difficulty, but with wearing off Botox, she noticed increased head tremor, and pulling towards her right shoulder.  She developed  transient difficulty lifting her neck up when using BOTOX A 200 units previously, reponding well to decreased dose of 150 units  She was enrolled into IPSEN dysport study, first injection was in April 30th 2013, she later enrolled into open label, 2nd injection was in  May 28th 2013. She did very well.  Recent few weeks,  She had experienced flareup of her rheumatoid arthritis, has received IV infusion. Last injection was in 06/10/2012,  500 units of dysport was dissolved into 2 cc of normal  saline. Right longissimus capitus 0.5 cc Right splenius capitis 0.5 cc Right levator scapular 0.5 cc Right iliocostalis 0.5 cc   Dysport research study has completed, she is now coming back for repeat injection, she noticed head bobbing over the past few weeks, she denies significant neck pain, weakness,  Her rheumatoid arthritis is under better control   Last injection was in Sep 2014, she did very well, no neck extension weakness, only rarely neck shaking  UPDATE March 8th 2016: She has lost follow-up since her last visit December 2014, She came in for treatment her cervical dystonia, she feel the tension of her neck all the time, stiffness. Denied gait difficulty, last injection was September 2014, she received 500 units of Dysport, works well for her, she is continue on medication for anxiety, rheumatoid arthritis She also complains of nighttime snoring, frequent awakening, excessive daytime sleepiness, fatigue, today's ESS is 6, FSS was 51,  UPDATE October 13 2014:   She has missed her scheduled sleep study, continue have worsening neck posturing, posterior neck pain, excessive daytime fatigue and sleepiness  UPDATE Nov 11 2014: She came in for EMG guided Dysport injection today, last injection was December 2014, she complains of worsening head titubation, bilateral hands mild postural tremor, posterior neck pain,  Update February 17 2015 She responded very well to last EMG guided Dysport injection Nov 11 2014, no significant head titubation, no significant neck pain  Update July 22 2015: Last injection was August, now she noticed returning of neck pulling and shaking, posterior neck muscle achy pain  Update October 21 2015: Last EMG guided Dysport injection was in January, she responded very well, only recent couple weeks, she noticed returning of neck pulling, shaking again, She has worsening rheumatoid arthritis, is receiving more frequent treatment, complains fatigue, diffuse body  achy pain  Update March 15 2016: She responded very well to previous Dysport injection in April, no significant side effect noticed, only recent few weeks, 5 months from previous injection, she noticed return of head shaking neck muscle tightness.  UPDATE Dec 14th 2017: She responded very well to previous injection in Sept 2017, no significant side effect noticed.  Update September 13 2016: She did well this last injection in December  Update January 15 2017: She noticed worsening head tremor, she had excessive stress, her sister passed away from lung cancer in April 2018,  UPDATE Apr 25 2017: She responded very well with previous injection, barely has noticeable head shaking.  Update August 01, 2017: She responded very well to previous dysport injection, only recently began to have recurrent head shaking  UPDATE December 19 2017: She responded well to previous injection  UPDATE Jun 20 2018: She did very well with previous injection  UPDATE October 01 2018: She did well to previous injection  UPDATE January 01 2019: She did well with previous injection  UPDATE Oct 28th 2020: She did well to previous injections  UPDATE Jan 27th 2021: She did well to previous injections  Update October 29, 2019: She did well with previous injection, no significant side effect noted.  REVIEW OF SYSTEMS: Full 14 system review of systems performed and notable only for as above.  All rest review of system were negative.  ALLERGIES: No Known Allergies  HOME MEDICATIONS: Current Outpatient Medications  Medication Sig Dispense Refill  . acetaminophen (TYLENOL) 325 MG tablet Take 650 mg by mouth once. One hour prior to infusion    . baclofen (LIORESAL) 10 MG tablet Take 10 mg by mouth daily as needed for muscle spasms.     Marland Kitchen BIOTIN PO Take by mouth daily.    . Calcium Citrate-Vitamin D (CALCIUM + D PO) Take 1,000 mg by mouth daily.    . clonazePAM (KLONOPIN) 0.5 MG tablet TAKE 1 TABLET BY MOUTH TWICE  DAILY AS NEEDED FOR ANXIETY 60 tablet 5  . diphenhydrAMINE (BENADRYL) 25 mg capsule Take 25 mg by mouth once. One hour prior to infusion    . DULoxetine (CYMBALTA) 60 MG capsule TAKE 1 CAPSULE(60 MG) BY MOUTH DAILY 30 capsule 2  . DYSPORT 500 units SOLR injection INJECT 500 UNITS  INTRAMUSCULARLY EVERY 3  MONTHS (GIVEN AT  PRESCRIBERS OFFICE, DISCARD UNUSED AFTER 1ST USE) 1 each 3  . folic acid (FOLVITE) 1 MG tablet TAKE 1 TABLET(1 MG) BY MOUTH DAILY 90 tablet 4  . inFLIXimab (REMICADE) 100 MG injection Inject 6 mg/kg into the vein every 6 (six) weeks. Infuse 500 mg/ 5 vials in 235mL NS over 2hours every 6 weeks    . methotrexate 50 MG/2ML injection INJECT 0.4 ML INTO THE SKIN ONCE A WEEK. 6 mL 0  . Multiple Vitamins-Minerals (MULTIVITAMINS THER. W/MINERALS) TABS Take 1 tablet by mouth daily.    Marland Kitchen omeprazole (PRILOSEC) 20 MG capsule TAKE 1 CAPSULE BY MOUTH EVERY DAY 90 capsule 3  . traMADol (ULTRAM) 50 MG tablet Take 1 - 2 every 8 hours as needed for pain. 30 tablet 0  . TUBERCULIN SYR 1CC/27GX1/2" (B-D TB SYRINGE 1CC/27GX1/2") 27G X 1/2" 1 ML MISC USE ONE SYRINGE ONCE WEEKLY AS DIRECTED 4 each 11  . TURMERIC PO Take by mouth daily.    . Xylitol (XYLIMELTS MT) Use as directed in the mouth or throat.     No current facility-administered medications for this visit.    PAST MEDICAL HISTORY: Past Medical History:  Diagnosis Date  . Anxiety   . Cervical dystonia   . Depression   . Fibromyalgia   . GERD (gastroesophageal reflux disease)   . History of shingles   . RA (rheumatoid arthritis) ([place])   . Synovitis of hand     PAST SURGICAL HISTORY: Past Surgical History:  Procedure Laterality Date  . TONSILLECTOMY    . VAGINAL HYSTERECTOMY  3/08    FAMILY HISTORY: Family History  Problem Relation Age of Onset  . Arthritis Mother   . Heart Problems Father   . Heart disease Father   . Heart disease Paternal Uncle        MI    SOCIAL HISTORY:  Social History   Socioeconomic  History  . Marital status: Married    Spouse name: [name]  . Number of children: 2  . Years of education: 72  . Highest education level: Not on file  Occupational History    Employer: [employer]  Tobacco Use  . Smoking status: Former Smoker    Packs/day: 0.50    Years: 30.00    Pack years: 15.00    Types: Cigarettes    Quit date: 05/02/2015    Years since quitting: 4.4  . Smokeless tobacco: Never Used  Substance and Sexual Activity  . Alcohol use: Yes    Alcohol/week: 0.0 standard drinks    Comment: Rare  . Drug use: Never  . Sexual activity: Yes    Birth control/protection: Surgical    Comment: 1st intercourse 59 yo-Fewer than 5 partners  Other Topics Concern  . 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 Right handed.    Caffeine- 4 cups daily.   Social Determinants of Health   Financial Resource Strain:   . Difficulty of Paying Living Expenses:   Food Insecurity:   . Worried About Charity fundraiser in the Last Year:   . Arboriculturist in the Last Year:   Transportation Needs:   . Film/video editor (Medical):   Marland Kitchen Lack of Transportation (Non-Medical):   Physical Activity:   . Days of Exercise per Week:   . Minutes of Exercise per Session:   Stress:   . Feeling of Stress :   Social Connections:   . Frequency of Communication with Friends and Family:   . Frequency of Social Gatherings with Friends and Family:   . Attends Religious Services:   . Active Member of Clubs or Organizations:   . Attends Archivist Meetings:   Marland Kitchen Marital Status:   Intimate Partner Violence:   . Fear of Current or Ex-Partner:   . Emotionally Abused:   Marland Kitchen Physically Abused:   . Sexually Abused:      PHYSICAL EXAM   Vitals:   10/29/19 1512  BP: 121/77  Pulse: 82  Weight: 179 lb (81.2 kg)  Height: 5\' 7"  (1.702 m)    Not recorded       Body mass index is 28.04 kg/m.  PHYSICAL EXAMNIATION:   Mild to moderate retrocollis, right tilt, mild right lateral shift, she has moderate tenderness at left levator scapular muscle   ASSESSMENT AND PLAN  Leeanna Powley is a 59 y.o. female with long-standing history of cervical dystonia, responded very well to previous EMG guided Dysport injection, last injection was May 2016, responded very well.   Mild retrocollis, right tilt, mild right  lateral shift, slight left shoulder elevation  500 of Dysport was dissolved into 2.5 cc of normal saline,  Right longissimus capitus 0. 25 cc Right splenius capitis 0.25 c Right inferior oblique capitis 0.25 cc Left inferior oblique capitis 0.5 cc Left splenius capitus 0.5 cc Left longissimus capitis 0.5 cc Left levator scapular 0.25   Patient tolerate the injection well, will return to clinic in 3 months for repeat injection Please dissolve 500 units of Dysport into 2.5 cc of normal saline   Marcial Pacas, M.D. Ph.D.  [facility] [address], [place], [place] [address] Ph: [phone] Fax: [phone]

## 2019-10-29 NOTE — Progress Notes (Signed)
**  Dysport 500 units x 1 vial, NDC [id number], Lot [id number], Exp 07/02/2020, specialty pharmacy.//[email or web address]**

## 2019-11-07 ENCOUNTER — Telehealth: Payer: Self-pay | Admitting: Rheumatology

## 2019-11-07 NOTE — Telephone Encounter (Signed)
Patient dropped off a FMLA form, and a check for $25.00. An authorization of disclosure was filled out. All three will be sent to [facility] on 11/10/2019.

## 2019-11-11 ENCOUNTER — Telehealth: Payer: Self-pay | Admitting: Pharmacist

## 2019-11-11 NOTE — Telephone Encounter (Signed)
Received fax from [facility] infusion.  Patient received Remicade 480 mg on 11/11/2019 without infusion.  Labs were not drawn at this visit.  Most recent CBC/CMP within normal limits on 09/30/2019. Last TB gold negative on 01/30/2019. Next infusion scheduled for 12/16/2019.  Mariella Saa, PharmD, [place], [place] [facility]ty Pharmacist [phone]  11/11/2019 7:22 PM

## 2019-11-12 ENCOUNTER — Ambulatory Visit: Payer: 59 | Admitting: Acute Care

## 2019-11-12 ENCOUNTER — Other Ambulatory Visit: Payer: Self-pay

## 2019-11-12 ENCOUNTER — Telehealth: Payer: Self-pay | Admitting: Acute Care

## 2019-11-12 ENCOUNTER — Encounter: Payer: Self-pay | Admitting: Acute Care

## 2019-11-12 ENCOUNTER — Ambulatory Visit: Admission: RE | Admit: 2019-11-12 | Payer: 59 | Source: Ambulatory Visit

## 2019-11-12 DIAGNOSIS — Z87891 Personal history of nicotine dependence: Secondary | ICD-10-CM

## 2019-11-12 DIAGNOSIS — Z122 Encounter for screening for malignant neoplasm of respiratory organs: Secondary | ICD-10-CM

## 2019-11-12 NOTE — Progress Notes (Signed)
Shared Decision Making Visit Lung Cancer Screening Program [phone])   Eligibility:  Age 59 y.o.  Pack Years Smoking History Calculation 30 + pack year smoking history (# packs/per year x # years smoked)  Recent History of coughing up blood  no  Unexplained weight loss? no ( >Than 15 pounds within the last 6 months )  Prior History Lung / other cancer no (Diagnosis within the last 5 years already requiring surveillance chest CT Scans).  Smoking Status Former Smoker  Former Smokers: Years since quit: 4 years  Quit Date: 05/02/2015  Visit Components:  Discussion included one or more decision making aids. yes  Discussion included risk/benefits of screening. yes  Discussion included potential follow up diagnostic testing for abnormal scans. yes  Discussion included meaning and risk of over diagnosis. yes  Discussion included meaning and risk of False Positives. yes  Discussion included meaning of total radiation exposure. yes  Counseling Included:  Importance of adherence to annual lung cancer LDCT screening. yes  Impact of comorbidities on ability to participate in the program. yes  Ability and willingness to under diagnostic treatment. yes  Smoking Cessation Counseling:  Current Smokers:   Discussed importance of smoking cessation. yes  Information about tobacco cessation classes and interventions provided to patient. yes  Patient provided with "ticket" for LDCT Scan. yes  Symptomatic Patient. no  Counseling  Diagnosis Code: Tobacco Use Z72.0  Asymptomatic Patient yes  Counseling (Intermediate counseling: > three minutes counseling) [id number]  Former Smokers:   Discussed the importance of maintaining cigarette abstinence. yes  Diagnosis Code: Personal History of Nicotine Dependence. [id number]  Information about tobacco cessation classes and interventions provided to patient. Yes  Patient provided with "ticket" for LDCT Scan. yes  Written Order for Lung  Cancer Screening with LDCT placed in Epic. Yes (CT Chest Lung Cancer Screening Low Dose W/O CM) [id number] Z12.2-Screening of respiratory organs Z87.891-Personal history of nicotine dependence  I spent 25 minutes of face to face time with Ms. Machorro discussing the risks and benefits of lung cancer screening. We viewed a power point together that explained in detail the above noted topics. We took the time to pause the power point at intervals to allow for questions to be asked and answered to ensure understanding. We discussed that she had taken the single most powerful action possible to decrease her risk of developing lung cancer when she quit smoking. I counseled her to remain smoke free, and to contact me if she ever had the desire to smoke again so that I can provide resources and tools to help support the effort to remain smoke free. We discussed the time and location of the scan, and that either  Doroteo Glassman RN or I will call with the results within  24-48 hours of receiving them. She has my card and contact information in the event she needs to speak with me, in addition to a copy of the power point we reviewed as a resource. She verbalized understanding of all of the above and had no further questions upon leaving the office.     I explained to the patient that there has been a high incidence of coronary artery disease noted on these exams. I explained that this is a non-gated exam therefore degree or severity cannot be determined. This patient is not  currently on statin therapy. I have asked the patient to follow-up with their PCP regarding any incidental finding of coronary artery disease and management with diet or medication as they  feel is clinically indicated. The patient verbalized understanding of the above and had no further questions.  PDMP OD Risk Score >> 010   Magdalen Spatz, NP 11/12/2019

## 2019-11-12 NOTE — Telephone Encounter (Signed)
Patient was scheduled for a 1:30 PM shared decision-making visit today to be followed by a low-dose CT at 3 PM at low [place] [facility].  I made a total of 4 phone calls between 130 and 145 utilizing both a home number a work number and a cell number to attempt to get in touch with the patient.  There was no answer at either of the phones.  I did leave messages for the patient to call the office to reschedule.  I have called low [facility] on [address] and I spoke with Lattie Haw to make sure she was aware that the patient had not had her shared decision-making visit, and therefore should not be scanned today. At present I do have a 3 PM opening on my schedule, and if the patient does present for CT I told Lattie Haw that I could do the shared decision-making visit over the phone so that the patient could then get scanned.  Lattie Haw has my cell phone number to call in the event the patient does show up to be scanned.  Otherwise the patient will be rescheduled for her shared decision-making visit and her CT scan.  Langley Gauss, please reschedule patient.  Thank you so much

## 2019-11-12 NOTE — Patient Instructions (Signed)
Thank you for participating in the [facility] Lung Cancer Screening Program. It was our pleasure to meet you today. We will call you with the results of your scan within the next few days. Your scan will be assigned a Lung RADS category score by the physicians reading the scans.  This Lung RADS score determines follow up scanning.  See below for description of categories, and follow up screening recommendations. We will be in touch to schedule your follow up screening annually or based on recommendations of our providers. We will fax a copy of your scan results to your Primary Care Physician, or the physician who referred you to the program, to ensure they have the results. Please call the office if you have any questions or concerns regarding your scanning experience or results.  Our office number is [phone]. Please speak with Denise Phelps, RN. She is our Lung Cancer Screening RN. If she is unavailable when you call, please have the office staff send her a message. She will return your call at her earliest convenience. Remember, if your scan is normal, we will scan you annually as long as you continue to meet the criteria for the program. (Age 55-77, Current smoker or smoker who has quit within the last 15 years). If you are a smoker, remember, quitting is the single most powerful action that you can take to decrease your risk of lung cancer and other pulmonary, breathing related problems. We know quitting is hard, and we are here to help.  Please let us know if there is anything we can do to help you meet your goal of quitting. If you are a former smoker, congratulations. We are proud of you! Remain smoke free! Remember you can refer friends or family members through the number above.  We will screen them to make sure they meet criteria for the program. Thank you for helping us take better care of you by participating in Lung Screening.  Lung RADS Categories:  Lung RADS 1: no nodules  or definitely non-concerning nodules.  Recommendation is for a repeat annual scan in 12 months.  Lung RADS 2:  nodules that are non-concerning in appearance and behavior with a very low likelihood of becoming an active cancer. Recommendation is for a repeat annual scan in 12 months.  Lung RADS 3: nodules that are probably non-concerning , includes nodules with a low likelihood of becoming an active cancer.  Recommendation is for a 6-month repeat screening scan. Often noted after an upper respiratory illness. We will be in touch to make sure you have no questions, and to schedule your 6-month scan.  Lung RADS 4 A: nodules with concerning findings, recommendation is most often for a follow up scan in 3 months or additional testing based on our provider's assessment of the scan. We will be in touch to make sure you have no questions and to schedule the recommended 3 month follow up scan.  Lung RADS 4 B:  indicates findings that are concerning. We will be in touch with you to schedule additional diagnostic testing based on our provider's  assessment of the scan.   

## 2019-11-13 NOTE — Telephone Encounter (Signed)
[place] x 1 for pt to r/s lung screening

## 2019-11-19 ENCOUNTER — Encounter: Payer: Self-pay | Admitting: Pharmacist

## 2019-11-20 NOTE — Telephone Encounter (Signed)
LMTC x 1  

## 2019-11-22 ENCOUNTER — Other Ambulatory Visit: Payer: Self-pay | Admitting: Rheumatology

## 2019-11-24 NOTE — Telephone Encounter (Signed)
Spoke with pt and rescheduled [facility] 12/29/19 1:30 CT will re rescheduled Nothing further needed

## 2019-11-24 NOTE — Telephone Encounter (Signed)
Last Visit: 10/20/2019 Next Visit: 03/24/2020  Okay to refill per Dr. Estanislado Pandy.

## 2019-11-28 ENCOUNTER — Telehealth: Payer: 59 | Admitting: Family Medicine

## 2019-11-28 ENCOUNTER — Encounter: Payer: Self-pay | Admitting: Family Medicine

## 2019-11-28 DIAGNOSIS — J40 Bronchitis, not specified as acute or chronic: Secondary | ICD-10-CM | POA: Diagnosis not present

## 2019-11-28 DIAGNOSIS — J0100 Acute maxillary sinusitis, unspecified: Secondary | ICD-10-CM

## 2019-11-28 MED ORDER — GUAIFENESIN-CODEINE 100-10 MG/5ML PO SOLN: 5.0000 mL | Freq: Four times a day (QID) | ORAL | 0 refills | Status: DC | PRN

## 2019-11-28 MED ORDER — AMOXICILLIN 875 MG PO TABS: 875.0000 mg | ORAL_TABLET | Freq: Two times a day (BID) | ORAL | 0 refills | Status: DC

## 2019-11-28 NOTE — Progress Notes (Signed)
Virtual Visit via Video Note  I connected with Courtney Myers on 11/28/19 at 12:38pm  by a video enabled telemedicine application and verified that I am speaking with the correct person using two identifiers.     Pt location: at home   Physician location:  In office, [facility], Vic Blackbird MD     On call: patient and physician   I discussed the limitations of evaluation and management by telemedicine and the availability of in person appointments. The patient expressed understanding and agreed to proceed.  History of Present Illness:  Telehealth visit in setting of  COVID-19 .  Patient called and she has had progressive hacking cough for the past week.   She does not feel ill but does feel congested in her chest and in her face.  She has very little production with her cough.  She has not had any fever chills no loss of taste or smell no headache.  She is immunocompromised she is on medications for rheumatoid arthritis.  She has  not had any GI symptoms.  She has taken Dimetapp as well as Mucinex but no improvement in the cough.  Cough keeps her up at night.  At times she coughs so much that she may urinate on herself.  She does not typically have seasonal allergies but did not try any antihistamine.  She does feel some drainage in her throat and mild facial pressure bilat  No known sick contacts    Observations/Objective: No acute distress noted over video.  Non toxic appearing  Nasally Sounding.  Normal work of breathing  Assessment and Plan: Sinusitis with bronchitis-her progressive symptoms down into her chest even though she has had her COVID-19 vaccine.  She is high risk for developing bacterial pneumonia.  We will start her on amoxicillin antibiotics to cover for sinusitis.  I will hold on prednisone in the setting of her other immune modulating medications.  She would also like to hold off on starting this.  We will give her Robitussin with codeine.  She  should take loratadine once a day for 2 weeks.  If she is not improving by next week we will get her into the office for in person visit  Follow Up Instructions:    I discussed the assessment and treatment plan with the patient. The patient was provided an opportunity to ask questions and all were answered. The patient agreed with the plan and demonstrated an understanding of the instructions.   The patient was advised to call back or seek an in-person evaluation if the symptoms worsen or if the condition fails to improve as anticipated.  I provided  10 minutes of non-face-to-face time during this encounter. End Time  12:48pm   Vic Blackbird, MD

## 2019-12-10 ENCOUNTER — Telehealth: Payer: Self-pay | Admitting: Pharmacy Technician

## 2019-12-10 NOTE — Telephone Encounter (Signed)
Received notification from [facility] regarding a prior authorization for Remicade. Authorization has been APPROVED from 11/26/19 to 05/28/20.   Authorization # [id number]

## 2019-12-29 ENCOUNTER — Ambulatory Visit (INDEPENDENT_AMBULATORY_CARE_PROVIDER_SITE_OTHER): Payer: 59 | Admitting: Acute Care

## 2019-12-29 ENCOUNTER — Other Ambulatory Visit: Payer: Self-pay

## 2019-12-29 ENCOUNTER — Ambulatory Visit (INDEPENDENT_AMBULATORY_CARE_PROVIDER_SITE_OTHER)
Admission: RE | Admit: 2019-12-29 | Discharge: 2019-12-29 | Disposition: A | Discharge status: Home / Self Care | Payer: 59 | Source: Ambulatory Visit | Attending: Acute Care | Admitting: Acute Care

## 2019-12-29 ENCOUNTER — Encounter: Payer: Self-pay | Admitting: Acute Care

## 2019-12-29 DIAGNOSIS — F17210 Nicotine dependence, cigarettes, uncomplicated: Secondary | ICD-10-CM

## 2019-12-29 DIAGNOSIS — Z122 Encounter for screening for malignant neoplasm of respiratory organs: Secondary | ICD-10-CM

## 2019-12-29 NOTE — Progress Notes (Signed)
Shared Decision Making Visit Lung Cancer Screening Program [phone])   Eligibility:  Age 59 y.o.  Pack Years Smoking History Calculation 34 pack year smoking history (# packs/per year x # years smoked)  Recent History of coughing up blood  no  Unexplained weight loss? no ( >Than 15 pounds within the last 6 months )  Prior History Lung / other cancer no (Diagnosis within the last 5 years already requiring surveillance chest CT Scans).  Smoking Status Current Smoker  Former Smokers: Years since quit:NA  Quit Date: NA  Visit Components:  Discussion included one or more decision making aids. yes  Discussion included risk/benefits of screening. yes  Discussion included potential follow up diagnostic testing for abnormal scans. yes  Discussion included meaning and risk of over diagnosis. yes  Discussion included meaning and risk of False Positives. yes  Discussion included meaning of total radiation exposure. yes  Counseling Included:  Importance of adherence to annual lung cancer LDCT screening. yes  Impact of comorbidities on ability to participate in the program. yes  Ability and willingness to under diagnostic treatment. yes  Smoking Cessation Counseling:  Current Smokers:   Discussed importance of smoking cessation. yes  Information about tobacco cessation classes and interventions provided to patient. yes  Patient provided with "ticket" for LDCT Scan. yes  Symptomatic Patient. no  Counseling(Intermediate counseling: > three minutes) [id number]  Diagnosis Code: Tobacco Use Z72.0  Asymptomatic Patient yes  Counseling (Intermediate counseling: > three minutes counseling) [id number]  Former Smokers:   Discussed the importance of maintaining cigarette abstinence. yes  Diagnosis Code: Personal History of Nicotine Dependence. J82.505  Information about tobacco cessation classes and interventions provided to patient. Yes  Patient provided with "ticket" for LDCT  Scan. yes  Written Order for Lung Cancer Screening with LDCT placed in Epic. Yes (CT Chest Lung Cancer Screening Low Dose W/O CM) [id number] Z12.2-Screening of respiratory organs Z87.891-Personal history of nicotine dependence  I have spent 25 minutes of face to face time with Courtney Myers discussing the risks and benefits of lung cancer screening. We viewed a power point together that explained in detail the above noted topics. We paused at intervals to allow for questions to be asked and answered to ensure understanding.We discussed that the single most powerful action that she can take to decrease her risk of developing lung cancer is to quit smoking. We discussed whether or not she is ready to commit to setting a quit date. We discussed options for tools to aid in quitting smoking including nicotine replacement therapy, non-nicotine medications, support groups, Quit Smart classes, and behavior modification. We discussed that often times setting smaller, more achievable goals, such as eliminating 1 cigarette a day for a week and then 2 cigarettes a day for a week can be helpful in slowly decreasing the number of cigarettes smoked. This allows for a sense of accomplishment as well as providing a clinical benefit. I gave her the " Be Stronger Than Your Excuses" card with contact information for community resources, classes, free nicotine replacement therapy, and access to mobile apps, text messaging, and on-line smoking cessation help. I have also given her my card and contact information in the event she needs to contact me. We discussed the time and location of the scan, and that either Doroteo Glassman RN or I will call with the results within 24-48 hours of receiving them. I have offered her  a copy of the power point we viewed  as a resource in the event  they need reinforcement of the concepts we discussed today in the office. The patient verbalized understanding of all of  the above and had no further  questions upon leaving the office. They have my contact information in the event they have any further questions.  I spent 3 minutes counseling on smoking cessation and the health risks of continued tobacco abuse.  I explained to the patient that there has been a high incidence of coronary artery disease noted on these exams. I explained that this is a non-gated exam therefore degree or severity cannot be determined. This patient is not on statin therapy. I have asked the patient to follow-up with their PCP regarding any incidental finding of coronary artery disease and management with diet or medication as their PCP  feels is clinically indicated. The patient verbalized understanding of the above and had no further questions upon completion of the visit.   Pt. Would like an OV with smoking cessation pharmacist. We will schedule   Magdalen Spatz, NP  12/29/2019 1:56 PM

## 2019-12-29 NOTE — Patient Instructions (Addendum)
Thank you for participating in the [facility]. It was our pleasure to meet you today. We will call you with the results of your scan within the next few days. Your scan will be assigned a Lung RADS category score by the physicians reading the scans.  This Lung RADS score determines follow up scanning.  See below for description of categories, and follow up screening recommendations. We will be in touch to schedule your follow up screening annually or based on recommendations of our providers. We will fax a copy of your scan results to your Primary Care Physician, or the physician who referred you to the program, to ensure they have the results. Please call the office if you have any questions or concerns regarding your scanning experience or results.  Our office number is [phone]. Please speak with Doroteo Glassman, RN. She is our Lung Cancer Lobbyist. If she is unavailable when you call, please have the office staff send her a message. She will return your call at her earliest convenience. Remember, if your scan is normal, we will scan you annually as long as you continue to meet the criteria for the program. (Age 21-77, Current smoker or smoker who has quit within the last 15 years). If you are a smoker, remember, quitting is the single most powerful action that you can take to decrease your risk of lung cancer and other pulmonary, breathing related problems. We know quitting is hard, and we are here to help.  Please let us know if there is anything we can do to help you meet your goal of quitting. If you are a former smoker, Medical sales representative. We are proud of you! Remain smoke free! Remember you can refer friends or family members through the number above.  We will screen them to make sure they meet criteria for the program. Thank you for helping [place] take better care of you by participating in Lung Screening.  Lung RADS Categories:  Lung RADS 1: no nodules  or definitely non-concerning nodules.  Recommendation is for a repeat annual scan in 12 months.  Lung RADS 2:  nodules that are non-concerning in appearance and behavior with a very low likelihood of becoming an active cancer. Recommendation is for a repeat annual scan in 12 months.  Lung RADS 3: nodules that are probably non-concerning , includes nodules with a low likelihood of becoming an active cancer.  Recommendation is for a 90-month repeat screening scan. Often noted after an upper respiratory illness. We will be in touch to make sure you have no questions, and to schedule your 38-month scan.  Lung RADS 4 A: nodules with concerning findings, recommendation is most often for a follow up scan in 3 months or additional testing based on our provider's assessment of the scan. We will be in touch to make sure you have no questions and to schedule the recommended 3 month follow up scan.  Lung RADS 4 B:  indicates findings that are concerning. We will be in touch with you to schedule additional diagnostic testing based on our provider's  assessment of the scan.  Pt. Would like an OV to discuss smoking cessation options. Jonelle Sidle, please schedule. Thanks

## 2020-01-01 NOTE — Progress Notes (Signed)
Please call patient and let them  know their  low dose Ct was read as a Lung RADS 2: nodules that are benign in appearance and behavior with a very low likelihood of becoming a clinically active cancer due to size or lack of growth. Recommendation per radiology is for a repeat LDCT in 12 months. .Please let them  know we will order and schedule their  annual screening scan for 12/2020 Please let them  know there was notation of CAD on their  scan.  Please remind the patient  that this is a non-gated exam therefore degree or severity of disease  cannot be determined. Please have them  follow up with their PCP regarding potential risk factor modification, dietary therapy or pharmacologic therapy if clinically indicated. Pt.  is  not currently on statin therapy. Please place order for annual  screening scan for  12/2020 and fax results to PCP. Thanks so much. 

## 2020-01-02 ENCOUNTER — Telehealth: Payer: Self-pay | Admitting: Acute Care

## 2020-01-02 DIAGNOSIS — Z87891 Personal history of nicotine dependence: Secondary | ICD-10-CM

## 2020-01-06 NOTE — Telephone Encounter (Signed)
Pt informed of CT results per Sarah Groce, NP.  PT verbalized understanding.  Copy sent to PCP.  Order placed for 1 yr f/u CT.  

## 2020-01-10 ENCOUNTER — Other Ambulatory Visit: Payer: Self-pay

## 2020-01-12 MED ORDER — METHOTREXATE SODIUM CHEMO INJECTION 50 MG/2ML: INTRAMUSCULAR | 0 refills | Status: DC

## 2020-01-12 NOTE — Telephone Encounter (Addendum)
Last Visit: 10/20/2019 Next Visit: 03/24/2020 Labs: 12/16/2019 RBC 3.56, HGB 10.8.   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

## 2020-01-13 ENCOUNTER — Telehealth: Payer: Self-pay | Admitting: Neurology

## 2020-01-13 NOTE — Telephone Encounter (Signed)
I called [facility] & spoke with Diane to see about scheduling delivery of Dysport for patient's 8/4 appointment. She states that the request is under medical benefit review, and [facility] will reach out to [place] to schedule delivery after review is complete.

## 2020-01-14 ENCOUNTER — Telehealth: Payer: Self-pay | Admitting: *Deleted

## 2020-01-14 NOTE — Telephone Encounter (Signed)
Received labs from [facility] on 12/16/2019 Reviewed by Hazel Sams, PA-C  CBC/CMP  RBC 3.56 Hgb 10.8  Patient is on MTX 0.4 mL weekly and Remicade Infusions.

## 2020-01-20 NOTE — Telephone Encounter (Signed)
I called [facility] [phone]) and spoke with Air. She helped me schedule delivery for 7/22. (1) 500U vial of Dysport.

## 2020-01-21 ENCOUNTER — Ambulatory Visit: Payer: 59 | Admitting: Nurse Practitioner

## 2020-01-21 ENCOUNTER — Other Ambulatory Visit: Payer: Self-pay

## 2020-01-21 VITALS — BP 120/72 | HR 83 | Temp 97.6°F | Resp 18 | Wt 173.8 lb

## 2020-01-21 DIAGNOSIS — N390 Urinary tract infection, site not specified: Secondary | ICD-10-CM | POA: Diagnosis not present

## 2020-01-21 DIAGNOSIS — R319 Hematuria, unspecified: Secondary | ICD-10-CM | POA: Diagnosis not present

## 2020-01-21 DIAGNOSIS — R399 Unspecified symptoms and signs involving the genitourinary system: Secondary | ICD-10-CM

## 2020-01-21 LAB — MICROSCOPIC MESSAGE

## 2020-01-21 LAB — URINALYSIS, ROUTINE W REFLEX MICROSCOPIC
Bilirubin Urine: NEGATIVE
Glucose, UA: NEGATIVE
Hyaline Cast: NONE SEEN /LPF
Ketones, ur: NEGATIVE
Nitrite: NEGATIVE
Protein, ur: NEGATIVE
Specific Gravity, Urine: 1.002 (ref 1.001–1.03)
pH: 6.5 (ref 5.0–8.0)

## 2020-01-21 MED ORDER — CIPROFLOXACIN HCL 500 MG PO TABS: 500.0000 mg | ORAL_TABLET | Freq: Two times a day (BID) | ORAL | 0 refills | Status: AC

## 2020-01-21 NOTE — Progress Notes (Signed)
Established Patient Office Visit  Subjective:  Patient ID: Courtney Myers, female    DOB: 1961-04-07  Age: 59 y.o. MRN: [id number]  CC:  Chief Complaint  Patient presents with  . Urinary Tract Infection    cloudy and smelly urine, pressure, burning, started 7/18, no meds    HPI Courtney Myers is a 59 year old female presenting with sxs of uti. Her sxs started Sunday with foul urine odor with urinary burning. Monday pelvic pressure and urinary urgency began and has gradually worsened since then. She has tried no txs. No fever, chills, general body aches, other gu/gi sxs, CVA pain.   Past Medical History:  Diagnosis Date  . Anxiety   . Cervical dystonia   . Depression   . Fibromyalgia   . GERD (gastroesophageal reflux disease)   . History of shingles   . RA (rheumatoid arthritis) ([place])   . Synovitis of hand     Past Surgical History:  Procedure Laterality Date  . TONSILLECTOMY    . VAGINAL HYSTERECTOMY  3/08    Family History  Problem Relation Age of Onset  . Arthritis Mother   . Heart Problems Father   . Heart disease Father   . Heart disease Paternal Uncle        MI    Social History   Socioeconomic History  . Marital status: Married    Spouse name: [name]  . Number of children: 2  . Years of education: 64  . Highest education level: Not on file  Occupational History    Employer: [employer]  Tobacco Use  . Smoking status: Current Every Day Smoker    Packs/day: 1.00    Years: 34.00    Pack years: 34.00    Types: Cigarettes    Start date: 8  . Smokeless tobacco: Never Used  Vaping Use  . Vaping Use: Never used  Substance and Sexual Activity  . Alcohol use: Yes    Alcohol/week: 0.0 standard drinks    Comment: Rare  . Drug use: Never  . Sexual activity: Yes    Birth control/protection: Surgical    Comment: 1st intercourse 59 yo-Fewer than 5 partners  Other Topics Concern  . 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 Right handed.    Caffeine- 4 cups daily.   Social Determinants of Health   Financial Resource Strain:   . Difficulty of Paying Living Expenses:   Food Insecurity:   . Worried About Charity fundraiser in the Last Year:   . Arboriculturist in the Last Year:   Transportation Needs:   . Film/video editor (Medical):   Marland Kitchen Lack of Transportation (Non-Medical):   Physical Activity:   . Days of Exercise per Week:   . Minutes of Exercise per Session:   Stress:   . Feeling of Stress :   Social Connections:   . Frequency of Communication with Friends and Family:   . Frequency of Social Gatherings with Friends and Family:   . Attends Religious Services:   . Active Member of Clubs or Organizations:   . Attends Archivist Meetings:   Marland Kitchen Marital Status:   Intimate Partner Violence:   . Fear of Current or Ex-Partner:   . Emotionally Abused:   Marland Kitchen Physically Abused:   . Sexually Abused:     Outpatient Medications  Prior to Visit  Medication Sig Dispense Refill  . acetaminophen (TYLENOL) 325 MG tablet Take 650 mg by mouth once. One hour prior to infusion    . baclofen (LIORESAL) 10 MG tablet Take 10 mg by mouth daily as needed for muscle spasms.     Marland Kitchen BIOTIN PO Take by mouth daily.    . Calcium Citrate-Vitamin D (CALCIUM + D PO) Take 1,000 mg by mouth daily.    . clonazePAM (KLONOPIN) 0.5 MG tablet TAKE 1 TABLET BY MOUTH TWICE DAILY AS NEEDED FOR ANXIETY 60 tablet 5  . diphenhydrAMINE (BENADRYL) 25 mg capsule Take 25 mg by mouth once. One hour prior to infusion    . DULoxetine (CYMBALTA) 60 MG capsule TAKE 1 CAPSULE(60 MG) BY MOUTH DAILY 30 capsule 2  . DYSPORT 500 units SOLR injection INJECT 500 UNITS  INTRAMUSCULARLY EVERY 3  MONTHS (GIVEN AT  PRESCRIBERS OFFICE, DISCARD UNUSED AFTER 1ST USE) 1 each 3  . folic acid (FOLVITE) 1 MG tablet TAKE 1 TABLET(1 MG) BY MOUTH  DAILY 90 tablet 4  . inFLIXimab (REMICADE) 100 MG injection Inject 6 mg/kg into the vein every 6 (six) weeks. Infuse 500 mg/ 5 vials in 233mL NS over 2hours every 6 weeks    . methotrexate 50 MG/2ML injection INJECT 0.4 ML INTO THE SKIN ONCE A WEEK. 6 mL 0  . Multiple Vitamins-Minerals (MULTIVITAMINS THER. W/MINERALS) TABS Take 1 tablet by mouth daily.    Marland Kitchen omeprazole (PRILOSEC) 20 MG capsule TAKE 1 CAPSULE BY MOUTH EVERY DAY 90 capsule 3  . traMADol (ULTRAM) 50 MG tablet Take 1 - 2 every 8 hours as needed for pain. 30 tablet 0  . TUBERCULIN SYR 1CC/27GX1/2" (B-D TB SYRINGE 1CC/27GX1/2") 27G X 1/2" 1 ML MISC USE ONE SYRINGE ONCE WEEKLY AS DIRECTED 4 each 11  . TURMERIC PO Take by mouth daily.    . Xylitol (XYLIMELTS MT) Use as directed in the mouth or throat.    Marland Kitchen amoxicillin (AMOXIL) 875 MG tablet Take 1 tablet (875 mg total) by mouth 2 (two) times daily. 20 tablet 0  . guaiFENesin-codeine 100-10 MG/5ML syrup Take 5 mLs by mouth every 6 (six) hours as needed. 180 mL 0   No facility-administered medications prior to visit.    No Known Allergies  ROS Review of Systems  All other systems reviewed and are negative.     Objective:    Physical Exam Vitals and nursing note reviewed.  Constitutional:      General: She is not in acute distress.    Appearance: Normal appearance. She is not ill-appearing, toxic-appearing or diaphoretic.  HENT:     Head: Normocephalic.  Eyes:     Extraocular Movements: Extraocular movements intact.     Conjunctiva/sclera: Conjunctivae normal.     Pupils: Pupils are equal, round, and reactive to light.  Cardiovascular:     Rate and Rhythm: Normal rate.  Pulmonary:     Effort: Pulmonary effort is normal.  Abdominal:     Tenderness: There is no right CVA tenderness, left CVA tenderness or guarding.  Musculoskeletal:     Cervical back: Normal range of motion and neck supple.     Right lower leg: No edema.     Left lower leg: No edema.  Skin:     General: Skin is warm and dry.     Coloration: Skin is not jaundiced or pale.     Findings: No rash.  Neurological:     General: No focal deficit present.  Mental Status: She is alert and oriented to person, place, and time.  Psychiatric:        Attention and Perception: Attention normal.        Mood and Affect: Mood normal.        Speech: Speech normal.        Behavior: Behavior normal. Behavior is cooperative.        Cognition and Memory: Cognition normal.        Judgment: Judgment normal.     BP 120/72 (BP Location: Left Arm, Patient Position: Sitting, Cuff Size: Normal)   Pulse 83   Temp 97.6 F (36.4 C) (Temporal)   Resp 18   Wt 173 lb 12.8 oz (78.8 kg)   SpO2 99%   BMI 27.22 kg/m  Wt Readings from Last 3 Encounters:  01/21/20 173 lb 12.8 oz (78.8 kg)  10/29/19 179 lb (81.2 kg)  10/20/19 178 lb 9.6 oz (81 kg)     Health Maintenance Due  Topic Date Due  . COVID-19 Vaccine (1) Never done  . PAP SMEAR-Modifier  Never done  . MAMMOGRAM  07/29/2017  . COLONOSCOPY  10/26/2019    There are no preventive care reminders to display for this patient.  Lab Results  Component Value Date   TSH 1.43 10/22/2017   Lab Results  Component Value Date   WBC 7.8 05/08/2019   HGB 11.9 05/08/2019   HCT 35.4 05/08/2019   MCV 89.2 05/08/2019   PLT 275 05/08/2019   Lab Results  Component Value Date   NA 138 04/03/2019   K 3.9 04/03/2019   CO2 26 04/03/2019   GLUCOSE 102 (H) 04/03/2019   BUN 12 04/03/2019   CREATININE 0.87 04/03/2019   BILITOT 0.3 04/03/2019   ALKPHOS 71 03/13/2019   AST 18 04/03/2019   ALT 13 04/03/2019   PROT 6.6 04/03/2019   ALBUMIN 3.6 03/13/2019   CALCIUM 9.1 04/03/2019   ANIONGAP 11 03/13/2019   Lab Results  Component Value Date   CHOL 206 (H) 10/22/2017   Lab Results  Component Value Date   HDL 52 10/22/2017   Lab Results  Component Value Date   LDLCALC 136 (H) 10/22/2017   Lab Results  Component Value Date   TRIG 83 10/22/2017     Lab Results  Component Value Date   CHOLHDL 4.0 10/22/2017   No results found for: HGBA1C    Assessment & Plan:   Problem List Items Addressed This Visit    None    Visit Diagnoses    Urinary tract infection with hematuria, site unspecified    -  Primary   Relevant Medications   ciprofloxacin (CIPRO) 500 MG tablet   UTI symptoms       Relevant Orders   Urinalysis, Routine w reflex microscopic (Completed)     Drink plenty of water  May try taking over the counter Azo and drink non sweetened cranberry juice at early sxs of UTI's as you reported h/o frequent UTI with negative urology workup.   Take antibiotic as prescribed, always start and complete course.   Meds ordered this encounter  Medications  . ciprofloxacin (CIPRO) 500 MG tablet    Sig: Take 1 tablet (500 mg total) by mouth 2 (two) times daily for 7 days.    Dispense:  14 tablet    Refill:  0    Follow-up: Return if symptoms worsen or fail to improve.    Annie Main, FNP

## 2020-01-21 NOTE — Patient Instructions (Signed)
Drink plenty of water  May try taking over the counter Azo and drink non sweetened cranberry juice at early sxs of UTI's as you reported h/o frequent UTI with negative urology workup.   Take antibiotic as prescribed, always start and complete course.

## 2020-01-22 NOTE — Telephone Encounter (Signed)
Medication delivered today from [facility] for patient's 8/4 appointment.

## 2020-01-23 LAB — URINE CULTURE
MICRO NUMBER:: 10732146
SPECIMEN QUALITY:: ADEQUATE

## 2020-01-29 ENCOUNTER — Ambulatory Visit: Payer: 59 | Admitting: Neurology

## 2020-02-03 ENCOUNTER — Telehealth: Payer: Self-pay | Admitting: Pharmacist

## 2020-02-03 NOTE — Telephone Encounter (Signed)
Received fax from [facility] infusion stating patient received Remicade 480 mg infusion on 02/03/2020 without issue.  TB Gold was also drawn and pending results.  Next infusion scheduled for 03/16/2020.   Mariella Saa, PharmD, [place], CPP Clinical Specialty Pharmacist (Rheumatology and Pulmonology)  02/03/2020 11:36 AM

## 2020-02-04 ENCOUNTER — Encounter: Payer: Self-pay | Admitting: Neurology

## 2020-02-04 ENCOUNTER — Ambulatory Visit: Payer: 59 | Admitting: Neurology

## 2020-02-04 VITALS — BP 131/82 | HR 86 | Ht 67.0 in | Wt 174.5 lb

## 2020-02-04 DIAGNOSIS — G243 Spasmodic torticollis: Secondary | ICD-10-CM | POA: Diagnosis not present

## 2020-02-04 NOTE — Progress Notes (Signed)
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  Symptom onset was without apparent triggering event, she has gradually developed this neck pulling,  manifested primarily with right laterocollis, mild retrocolis, and left shoulder elevation since 2000. She has good relief with regular Botox injection, about 4 times a year since 2003. Last injection was February 18, 2010 by Dr. Marta Antu,  She reported great relief as usual, taking 4- 5 days to get symptom relieve, lasting about 3 months.  I began to inject her since 12.2011, every 3-4 months, dosage has been limited to 150 units of BOTOX A.  She denies neck pain, gait difficulty, but with wearing off Botox, she noticed increased head tremor, and pulling towards her right shoulder.  She developed  transient difficulty lifting her neck up when using BOTOX A 200 units previously, reponding well to decreased dose of 150 units  She was enrolled into IPSEN dysport study, first injection was in April 30th 2013, she later enrolled into open label, 2nd injection was in  May 28th 2013. She did very well.  Recent few weeks,  She had experienced flareup of her rheumatoid arthritis, has received IV infusion. Last injection was in 06/10/2012,  500 units of dysport was dissolved into 2 cc of normal saline. Right longissimus capitus 0.5 cc Right splenius capitis 0.5 cc Right levator scapular 0.5 cc Right iliocostalis 0.5 cc   Dysport research study has completed, she is now coming back for repeat injection, she noticed head bobbing over the past few weeks, she denies significant neck pain, weakness,  Her rheumatoid arthritis is under better control   Last injection was in Sep 2014, she did very well, no neck extension weakness, only rarely neck shaking  UPDATE March 8th 2016: She has lost follow-up since her last  visit December 2014, She came in for treatment her cervical dystonia, she feel the tension of her neck all the time, stiffness. Denied gait difficulty, last injection was September 2014, she received 500 units of Dysport, works well for her, she is continue on medication for anxiety, rheumatoid arthritis She also complains of nighttime snoring, frequent awakening, excessive daytime sleepiness, fatigue, today's ESS is 6, FSS was 19,  UPDATE October 13 2014:   She has missed her scheduled sleep study, continue have worsening neck posturing, posterior neck pain, excessive daytime fatigue and sleepiness  UPDATE Nov 11 2014: She came in for EMG guided Dysport injection today, last injection was December 2014, she complains of worsening head titubation, bilateral hands mild postural tremor, posterior neck pain,  Update February 17 2015 She responded very well to last EMG guided Dysport injection Nov 11 2014, no significant head titubation, no significant neck pain  Update July 22 2015: Last injection was August, now she noticed returning of neck pulling and shaking, posterior neck muscle achy pain  Update October 21 2015: Last EMG guided Dysport injection was in January, she responded very well, only recent couple weeks, she noticed returning of neck pulling, shaking again, She has worsening rheumatoid arthritis, is receiving more frequent treatment, complains fatigue, diffuse body achy pain  Update March 15 2016: She responded very well to previous Dysport injection in April, no significant side effect noticed, only recent few weeks, 5 months from previous injection, she noticed return of head shaking neck muscle tightness.  UPDATE Dec 14th 2017: She responded very well  to previous injection in Sept 2017, no significant side effect noticed.  Update September 13 2016: She did well this last injection in December  Update January 15 2017: She noticed worsening head tremor, she had excessive stress,  her sister passed away from lung cancer in April 2018,  UPDATE Apr 25 2017: She responded very well with previous injection, barely has noticeable head shaking.  Update August 01, 2017: She responded very well to previous dysport injection, only recently began to have recurrent head shaking  UPDATE December 19 2017: She responded well to previous injection  UPDATE Sept 25 2019: She responded very well to previous injection  REVIEW OF SYSTEMS: Full 14 system review of systems performed and notable only for ear pain, joint pain, joint swelling, achy muscles, muscle cramps, rash, tremors, depression, anxiety  ALLERGIES: No Known Allergies  HOME MEDICATIONS: Current Outpatient Medications  Medication Sig Dispense Refill  . acetaminophen (TYLENOL) 325 MG tablet Take 650 mg by mouth once. One hour prior to infusion    . baclofen (LIORESAL) 10 MG tablet Take 10 mg by mouth daily as needed for muscle spasms.     Marland Kitchen BIOTIN PO Take by mouth daily.    . Calcium Citrate-Vitamin D (CALCIUM + D PO) Take 1,000 mg by mouth daily.    . clonazePAM (KLONOPIN) 0.5 MG tablet TAKE 1 TABLET BY MOUTH TWICE DAILY AS NEEDED FOR ANXIETY 60 tablet 5  . diphenhydrAMINE (BENADRYL) 25 mg capsule Take 25 mg by mouth once. One hour prior to infusion    . DULoxetine (CYMBALTA) 60 MG capsule TAKE 1 CAPSULE(60 MG) BY MOUTH DAILY 30 capsule 2  . DYSPORT 500 units SOLR injection INJECT 500 UNITS  INTRAMUSCULARLY EVERY 3  MONTHS (GIVEN AT  PRESCRIBERS OFFICE, DISCARD UNUSED AFTER 1ST USE) 1 each 3  . folic acid (FOLVITE) 1 MG tablet TAKE 1 TABLET(1 MG) BY MOUTH DAILY 90 tablet 4  . inFLIXimab (REMICADE) 100 MG injection Inject 6 mg/kg into the vein every 6 (six) weeks. Infuse 500 mg/ 5 vials in 267mL NS over 2hours every 6 weeks    . methotrexate 50 MG/2ML injection INJECT 0.4 ML INTO THE SKIN ONCE A WEEK. 6 mL 0  . Multiple Vitamins-Minerals (MULTIVITAMINS THER. W/MINERALS) TABS Take 1 tablet by mouth daily.    Marland Kitchen omeprazole  (PRILOSEC) 20 MG capsule TAKE 1 CAPSULE BY MOUTH EVERY DAY 90 capsule 3  . traMADol (ULTRAM) 50 MG tablet Take 1 - 2 every 8 hours as needed for pain. 30 tablet 0  . TUBERCULIN SYR 1CC/27GX1/2" (B-D TB SYRINGE 1CC/27GX1/2") 27G X 1/2" 1 ML MISC USE ONE SYRINGE ONCE WEEKLY AS DIRECTED 4 each 11  . TURMERIC PO Take by mouth daily.    . Xylitol (XYLIMELTS MT) Use as directed in the mouth or throat.     No current facility-administered medications for this visit.    PAST MEDICAL HISTORY: Past Medical History:  Diagnosis Date  . Anxiety   . Cervical dystonia   . Depression   . Fibromyalgia   . GERD (gastroesophageal reflux disease)   . History of shingles   . RA (rheumatoid arthritis) ([place])   . Synovitis of hand     PAST SURGICAL HISTORY: Past Surgical History:  Procedure Laterality Date  . TONSILLECTOMY    . VAGINAL HYSTERECTOMY  3/08    FAMILY HISTORY: Family History  Problem Relation Age of Onset  . Arthritis Mother   . Heart Problems Father   . Heart disease Father   .  Heart disease Paternal Uncle        MI    SOCIAL HISTORY:  Social History   Socioeconomic History  . Marital status: Married    Spouse name: [name]  . Number of children: 2  . Years of education: 22  . Highest education level: Not on file  Occupational History    Employer: [employer]  Tobacco Use  . Smoking status: Current Every Day Smoker    Packs/day: 1.00    Years: 34.00    Pack years: 34.00    Types: Cigarettes    Start date: 44  . Smokeless tobacco: Never Used  Vaping Use  . Vaping Use: Never used  Substance and Sexual Activity  . Alcohol use: Yes    Alcohol/week: 0.0 standard drinks    Comment: Rare  . Drug use: Never  . Sexual activity: Yes    Birth control/protection: Surgical    Comment: 1st intercourse 59 yo-Fewer than 5 partners  Other Topics Concern  . 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 Right handed.    Caffeine- 4 cups daily.   Social Determinants of Health   Financial Resource Strain:   . Difficulty of Paying Living Expenses:   Food Insecurity:   . Worried About Charity fundraiser in the Last Year:   . Arboriculturist in the Last Year:   Transportation Needs:   . Film/video editor (Medical):   Marland Kitchen Lack of Transportation (Non-Medical):   Physical Activity:   . Days of Exercise per Week:   . Minutes of Exercise per Session:   Stress:   . Feeling of Stress :   Social Connections:   . Frequency of Communication with Friends and Family:   . Frequency of Social Gatherings with Friends and Family:   . Attends Religious Services:   . Active Member of Clubs or Organizations:   . Attends Archivist Meetings:   Marland Kitchen Marital Status:   Intimate Partner Violence:   . Fear of Current or Ex-Partner:   . Emotionally Abused:   Marland Kitchen Physically Abused:   . Sexually Abused:      PHYSICAL EXAM   Vitals:   02/04/20 1514  BP: 131/82  Pulse: 86  Weight: 174 lb 8 oz (79.2 kg)  Height: 5\' 7"  (1.702 m)   Not recorded     Body mass index is 27.33 kg/m.  PHYSICAL EXAMNIATION: MENTAL STATUS: Speech is normal, normal to casual conversation, and history taking  CRANIAL NERVES: mild retrocollis, right tilt, mild right lateral shift, slight left shoulder elevation  MOTOR: Normal tone and bulk and strength COORDINATION: No dysmetria   GAIT/STANCE: Posture is normal.  DIAGNOSTIC DATA (LABS, IMAGING, TESTING) - I reviewed patient records, labs, notes, testing and imaging myself where available.  ASSESSMENT AND PLAN  Amita Atayde is a 59 y.o. female with long-standing history of cervical dystonia, responded very well to previous EMG guided Dysport injection, last injection was May 2016, responded very well.   Mild retrocollis, right tilt, mild right lateral shift, slight left shoulder  elevation  500 of Dysport was dissolved into 2.5 cc of normal saline,  Right longissimus capitus 0. 5 cc Left splenius capitis 0.5 cc Right inferior oblique capitis, 0.5 cc  Left inferior oblique capitis 0.5 cc Left levator scapular 0.5   Patient tolerate the injection well, will return  to clinic in 3 months for repeat injection Please dissolve 500 units of Dysport into 2.5 cc of normal saline   Marcial Pacas, M.D. Ph.D.  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  Symptom onset was without apparent triggering event, she has gradually developed this neck pulling,  manifested primarily with right laterocollis, mild retrocolis, and left shoulder elevation since 2000. She has good relief with regular Botox injection, about 4 times a year since 2003. Last injection was February 18, 2010 by Dr. Marta Antu,  She reported great relief as usual, taking 4- 5 days to get symptom relieve, lasting about 3 months.  I began to inject her since 12.2011, every 3-4 months, dosage has been limited to 150 units of BOTOX A.  She denies neck pain, gait difficulty, but with wearing off Botox, she noticed increased head tremor, and pulling towards her right shoulder.  She developed  transient difficulty lifting her neck up when using BOTOX A 200 units previously, reponding well to decreased dose of 150 units  She was enrolled into IPSEN dysport study, first injection was in April 30th 2013, she later enrolled into open label, 2nd injection was in  May 28th 2013. She did very well.  Recent few weeks,  She had experienced flareup of her rheumatoid arthritis, has received IV infusion. Last injection was in 06/10/2012,  500 units of dysport was  dissolved into 2 cc of normal saline. Right longissimus capitus 0.5 cc Right splenius capitis 0.5 cc Right levator scapular 0.5 cc Right iliocostalis 0.5 cc   Dysport research study has completed, she is now coming back for repeat injection, she noticed head bobbing over the past few weeks, she denies significant neck pain, weakness,  Her rheumatoid arthritis is under better control   Last injection was in Sep 2014, she did very well, no neck extension weakness, only rarely neck shaking  UPDATE March 8th 2016: She has lost follow-up since her last visit December 2014, She came in for treatment her cervical dystonia, she feel the tension of her neck all the time, stiffness. Denied gait difficulty, last injection was September 2014, she received 500 units of Dysport, works well for her, she is continue on medication for anxiety, rheumatoid arthritis She also complains of nighttime snoring, frequent awakening, excessive daytime sleepiness, fatigue, today's ESS is 6, FSS was 59,  UPDATE October 13 2014:   She has missed her scheduled sleep study, continue have worsening neck posturing, posterior neck pain, excessive daytime fatigue and sleepiness  UPDATE Nov 11 2014: She came in for EMG guided Dysport injection today, last injection was December 2014, she complains of worsening head titubation, bilateral hands mild postural tremor, posterior neck pain,  Update February 17 2015 She responded very well to last EMG guided Dysport injection Nov 11 2014, no significant head titubation, no significant neck pain  Update July 22 2015: Last injection was August, now she noticed returning of neck pulling and shaking, posterior neck muscle achy pain  Update October 21 2015: Last EMG guided Dysport injection was in January, she responded very well, only recent couple weeks, she noticed returning of neck pulling, shaking again, She has worsening rheumatoid arthritis, is receiving more frequent treatment,  complains fatigue, diffuse body achy pain  Update March 15 2016: She responded very well to previous Dysport injection in April, no significant side effect noticed, only recent few weeks, 5 months from previous injection, she noticed return of head shaking neck muscle tightness.  UPDATE Dec 14th 2017: She responded very well to previous injection in Sept 2017, no significant side effect noticed.  Update September 13 2016: She did well this last injection in December  Update January 15 2017: She noticed worsening head tremor, she had excessive stress, her sister passed away from lung cancer in April 2018,  UPDATE Apr 25 2017: She responded very well with previous injection, barely has noticeable head shaking.  Update August 01, 2017: She responded very well to previous dysport injection, only recently began to have recurrent head shaking  UPDATE December 19 2017: She responded well to previous injection  UPDATE Jun 20 2018: She did very well with previous injection  UPDATE October 01 2018: She did well to previous injection  UPDATE January 01 2019: She did well with previous injection  UPDATE Oct 28th 2020: She did well to previous injections  UPDATE Jan 27th 2021: She did well to previous injections  Update October 29, 2019: She did well with previous injection, no significant side effect noted.  Update February 04, 2020, Uses Dysport 500 units, responding well  REVIEW OF SYSTEMS: Full 14 system review of systems performed and notable only for as above.  All rest review of system were negative.  ALLERGIES: No Known Allergies  HOME MEDICATIONS: Current Outpatient Medications  Medication Sig Dispense Refill  . acetaminophen (TYLENOL) 325 MG tablet Take 650 mg by mouth once. One hour prior to infusion    . baclofen (LIORESAL) 10 MG tablet Take 10 mg by mouth daily as needed for muscle spasms.     Marland Kitchen BIOTIN PO Take by mouth daily.    . Calcium Citrate-Vitamin D (CALCIUM + D PO)  Take 1,000 mg by mouth daily.    . clonazePAM (KLONOPIN) 0.5 MG tablet TAKE 1 TABLET BY MOUTH TWICE DAILY AS NEEDED FOR ANXIETY 60 tablet 5  . diphenhydrAMINE (BENADRYL) 25 mg capsule Take 25 mg by mouth once. One hour prior to infusion    . DULoxetine (CYMBALTA) 60 MG capsule TAKE 1 CAPSULE(60 MG) BY MOUTH DAILY 30 capsule 2  . DYSPORT 500 units SOLR injection INJECT 500 UNITS  INTRAMUSCULARLY EVERY 3  MONTHS (GIVEN AT  PRESCRIBERS OFFICE, DISCARD UNUSED AFTER 1ST USE) 1 each 3  . folic acid (FOLVITE) 1 MG tablet TAKE 1 TABLET(1 MG) BY MOUTH DAILY 90 tablet 4  . inFLIXimab (REMICADE) 100 MG injection Inject 6 mg/kg into the vein every 6 (six) weeks. Infuse 500 mg/ 5 vials in 290mL NS over 2hours every 6 weeks    . methotrexate 50 MG/2ML injection INJECT 0.4 ML INTO THE SKIN ONCE A WEEK. 6 mL 0  . Multiple Vitamins-Minerals (MULTIVITAMINS THER. W/MINERALS) TABS Take 1 tablet by mouth daily.    Marland Kitchen omeprazole (PRILOSEC) 20 MG capsule TAKE 1 CAPSULE BY MOUTH EVERY DAY 90 capsule 3  . traMADol (ULTRAM) 50 MG tablet Take 1 - 2 every 8 hours as needed for pain. 30 tablet 0  . TUBERCULIN SYR 1CC/27GX1/2" (B-D TB SYRINGE 1CC/27GX1/2") 27G X 1/2" 1 ML MISC USE ONE SYRINGE ONCE WEEKLY AS DIRECTED 4 each 11  . TURMERIC PO Take by mouth daily.    . Xylitol (XYLIMELTS MT) Use as directed in the mouth or throat.     No current facility-administered  medications for this visit.    PAST MEDICAL HISTORY: Past Medical History:  Diagnosis Date  . Anxiety   . Cervical dystonia   . Depression   . Fibromyalgia   . GERD (gastroesophageal reflux disease)   . History of shingles   . RA (rheumatoid arthritis) ([place])   . Synovitis of hand     PAST SURGICAL HISTORY: Past Surgical History:  Procedure Laterality Date  . TONSILLECTOMY    . VAGINAL HYSTERECTOMY  3/08    FAMILY HISTORY: Family History  Problem Relation Age of Onset  . Arthritis Mother   . Heart Problems Father   . Heart disease Father   .  Heart disease Paternal Uncle        MI    SOCIAL HISTORY:  Social History   Socioeconomic History  . Marital status: Married    Spouse name: [name]  . Number of children: 2  . Years of education: 68  . Highest education level: Not on file  Occupational History    Employer: [employer]  Tobacco Use  . Smoking status: Current Every Day Smoker    Packs/day: 1.00    Years: 34.00    Pack years: 34.00    Types: Cigarettes    Start date: 52  . Smokeless tobacco: Never Used  Vaping Use  . Vaping Use: Never used  Substance and Sexual Activity  . Alcohol use: Yes    Alcohol/week: 0.0 standard drinks    Comment: Rare  . Drug use: Never  . Sexual activity: Yes    Birth control/protection: Surgical    Comment: 1st intercourse 59 yo-Fewer than 5 partners  Other Topics Concern  . 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 Right handed.    Caffeine- 4 cups daily.   Social Determinants of Health   Financial Resource Strain:   . Difficulty of Paying Living Expenses:   Food Insecurity:   . Worried About Charity fundraiser in the Last Year:   . Arboriculturist in the Last Year:   Transportation Needs:   . Film/video editor (Medical):   Marland Kitchen Lack of Transportation (Non-Medical):   Physical Activity:   . Days of Exercise per Week:   . Minutes of Exercise per Session:   Stress:   . Feeling of Stress :   Social Connections:   . Frequency of Communication with Friends and Family:   . Frequency of Social Gatherings with Friends and Family:   . Attends Religious Services:   . Active Member of Clubs or Organizations:   . Attends Archivist Meetings:   Marland Kitchen Marital Status:   Intimate Partner Violence:   . Fear of Current or Ex-Partner:   . Emotionally Abused:   Marland Kitchen Physically Abused:   . Sexually Abused:      PHYSICAL EXAM   Vitals:   02/04/20  1514  BP: 131/82  Pulse: 86  Weight: 174 lb 8 oz (79.2 kg)  Height: 5\' 7"  (1.702 m)   Not recorded     Body mass index is 27.33 kg/m.  PHYSICAL EXAMNIATION:   Mild retrocollis, right tilt, mild right lateral shift, she has moderate tenderness at left levator scapular muscle   ASSESSMENT AND PLAN  Jodean Valade is a 59 y.o. female with long-standing history of cervical dystonia, responded very well to previous EMG guided Dysport injection,  last injection was May 2016, responded very well.   Mild retrocollis, right tilt, mild right lateral shift, slight left shoulder elevation  500 of Dysport was dissolved into 2.5 cc of normal saline,  Right longissimus capitus 0. 25 cc Right splenius capitis 0.25 c Right inferior oblique capitis 0.25 cc Left inferior oblique capitis 0. 2 5 cc Left splenius capitus 0. 5 cc Left longissimus capitis 0. 2 5 Left splenius cervix 0.25 cc Left levator scapular 0. 5 cc   Patient tolerate the injection well, will return to clinic in 3 months for repeat injection Please dissolve 500 units of Dysport into 2.5 cc of normal saline   Marcial Pacas, M.D. Ph.D.  [facility] [address] [place], [place] [address] Ph: [phone] Fax: [phone]

## 2020-02-04 NOTE — Progress Notes (Signed)
**  Dysport 500 units x 1 vial, NDC [address]-4, Lot [id number], Exp 09/30/2020, specialty pharmacy.//mck,rn**

## 2020-02-17 ENCOUNTER — Other Ambulatory Visit: Payer: Self-pay | Admitting: Rheumatology

## 2020-02-17 NOTE — Telephone Encounter (Signed)
Last Visit: 10/20/2019 Next Visit: 03/24/2020  Current Dose per office note on 10/20/2019: Cymbalta 60 mg 1 capsule by mouth daily  Okay to refill per Dr. Estanislado Pandy

## 2020-02-27 ENCOUNTER — Other Ambulatory Visit: Payer: Self-pay | Admitting: Rheumatology

## 2020-03-10 NOTE — Progress Notes (Signed)
Office Visit Note  Patient: Courtney Myers             Date of Birth: July 08, 1960           MRN: [id number]             PCP: Susy Frizzle, MD Referring: Susy Frizzle, MD Visit Date: 03/24/2020 Occupation: @GUAROCC @  Subjective:  Discuss medication options   History of Present Illness: Courtney Myers is a 59 y.o. female with history of seronegative rheumatoid arthritis and fibromyalgia.  She is on Remicade 6 mg/kg IV infusions every 6 weeks, MTX 0.4 ml sq injections once weekly, and folic acid 1 mg po daily.  Her most recent infusion was on 03/16/2020.  Patient reports that she was originally started on Remicade infusions in July 2013 and up until the past year she has found Remicade to be very effective.  She has been experiencing at least 1-2 flares per month over the past several months.  She brought pictures with her of extensor tenosynovitis of both wrists.  She has been experiencing increased pain in both shoulders, both wrists, both hands, and both feet.  She is started to notice more deformities in her feet especially her right foot.  She feels as though her fibromyalgia has also been flaring more frequently and she has had significant fatigue.  She has been having to use more FMLA due to the frequency of her flares.  She continues to take Cymbalta 60 mg 1 capsule by mouth daily but is unsure if it is as effective as it previously was since she has been feeling more depressed and having increased fatigue.    Activities of Daily Living:  Patient reports morning stiffness for 15 minutes.   Patient Reports nocturnal pain.  Difficulty dressing/grooming: Denies Difficulty climbing stairs: Reports Difficulty getting out of chair: Reports Difficulty using hands for taps, buttons, cutlery, and/or writing: Denies  Review of Systems  Constitutional: Positive for fatigue.  HENT: Positive for mouth sores, mouth dryness and nose dryness.   Eyes: Positive for dryness.  Negative for pain, itching and visual disturbance.  Respiratory: Negative for cough, hemoptysis, shortness of breath and difficulty breathing.   Cardiovascular: Negative for chest pain, palpitations and swelling in legs/feet.  Gastrointestinal: Negative for blood in stool, constipation and diarrhea.  Endocrine: Negative for increased urination.  Genitourinary: Negative for difficulty urinating and painful urination.  Musculoskeletal: Positive for arthralgias, joint pain, joint swelling, myalgias, muscle weakness, morning stiffness, muscle tenderness and myalgias.  Skin: Positive for rash and redness. Negative for color change.  Allergic/Immunologic: Negative for susceptible to infections.  Neurological: Positive for weakness. Negative for dizziness, numbness, headaches and memory loss.  Hematological: Negative for bruising/bleeding tendency and swollen glands.  Psychiatric/Behavioral: Positive for sleep disturbance. Negative for confusion.    PMFS History:  Patient Active Problem List   Diagnosis Date Noted  . Encounter for screening for lung cancer 12/29/2019  . Excessive sleepiness 09/08/2014  . Fibromyalgia syndrome 08/25/2014  . RA (rheumatoid arthritis) ([place])   . GERD (gastroesophageal reflux disease)   . Anxiety   . Depression   . History of shingles   . Synovitis of hand   . Cervical dystonia 12/04/2012    Past Medical History:  Diagnosis Date  . Anxiety   . Cervical dystonia   . Depression   . Fibromyalgia   . GERD (gastroesophageal reflux disease)   . History of shingles   . RA (rheumatoid arthritis) ([place])   .  Synovitis of hand     Family History  Problem Relation Age of Onset  . Arthritis Mother   . Heart Problems Father   . Heart disease Father   . Heart disease Paternal Uncle        MI   Past Surgical History:  Procedure Laterality Date  . TONSILLECTOMY    . 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 Right handed.    Caffeine- 4 cups daily.   Immunization History  Administered Date(s) Administered  . Influenza,inj,Quad PF,6+ Mos 05/25/2018, 04/03/2019  . Influenza-Unspecified 06/24/2014, 05/01/2015, 04/10/2016, 04/06/2017  . Moderna SARS-COVID-2 Vaccination 08/29/2019, 09/26/2019  . Pneumococcal Conjugate-13 05/01/2015  . Pneumococcal Polysaccharide-23 04/10/2016  . Tdap 09/09/2015     Objective: Vital Signs: BP 135/84 (BP Location: Left Arm, Patient Position: Sitting, Cuff Size: Small)   Pulse 85   Resp 14   Ht 5\' 6"  (1.676 m)   Wt 176 lb 3.2 oz (79.9 kg)   BMI 28.44 kg/m    Physical Exam Vitals and nursing note reviewed.  Constitutional:      Appearance: She is well-developed.  HENT:     Head: Normocephalic and atraumatic.  Eyes:     Conjunctiva/sclera: Conjunctivae normal.  Pulmonary:     Effort: Pulmonary effort is normal.  Abdominal:     Palpations: Abdomen is soft.  Musculoskeletal:     Cervical back: Normal range of motion.  Skin:    General: Skin is warm and dry.     Capillary Refill: Capillary refill takes less than 2 seconds.  Neurological:     Mental Status: She is alert and oriented to person, place, and time.  Psychiatric:        Behavior: Behavior normal.      Musculoskeletal Exam: C-spine, thoracic spine, and lumbar spine good ROM. No midline spinal tenderness.  No SI joint tenderness.  Shoulder joints, elbow joints, wrist joints, MCPs, PIPs, and DIPs good ROM with no synovitis.  tenderness of the right 3rd PIP joint. Hip joints good ROM with no discomfort.  Knee joints good ROM with no warmth or effusion.  Ankle joints good ROM with no tenderness or inflammation.  Tenderness of the right 2nd, 3rd, and 4th MTP joints.  Bunions noted over bilateral 5th MTPs.   CDAI Exam: CDAI Score: -- Patient Global: --; Provider Global: -- Swollen: --; Tender:  -- Joint Exam 03/24/2020   No joint exam has been documented for this visit   There is currently no information documented on the homunculus. Go to the Rheumatology activity and complete the homunculus joint exam.  Investigation: No additional findings.  Imaging: No results found.  Recent Labs: Lab Results  Component Value Date   WBC 7.8 05/08/2019   HGB 11.9 05/08/2019   PLT 275 05/08/2019   NA 138 04/03/2019   K 3.9 04/03/2019   CL 105 04/03/2019   CO2 26 04/03/2019   GLUCOSE 102 (H) 04/03/2019   BUN 12 04/03/2019   CREATININE 0.87 04/03/2019   BILITOT 0.3 04/03/2019   ALKPHOS 71 03/13/2019   AST 18 04/03/2019   ALT 13 04/03/2019   PROT 6.6 04/03/2019   ALBUMIN 3.6 03/13/2019   CALCIUM 9.1 04/03/2019   GFRAA 84 04/03/2019   QFTBGOLD Negative 09/14/2015   QFTBGOLDPLUS Negative 01/30/2019    Speciality Comments: Remicade 6mg /kg every 6 weeks @  [facility]   Procedures:  No procedures performed Allergies: Patient has no known allergies.   Assessment / Plan:     Visit Diagnoses: Rheumatoid arthritis of multiple sites with negative rheumatoid factor ([place]): She has been experiencing recurrent and severe flares of rheumatoid arthritis over the past several months.  She brought pictures of extensive extensor tenosynovitis of both wrists.  According to the patient she has been having 1-2 flares per month as well as worsening fatigue.  She has had intermittent pain in both shoulders, both elbows, both wrists, both hands, and both feet.  She has tenderness of the third PIP joint of the right hand on exam today as well as tenderness of the second, third, and fourth MTP of the right foot.  She was initially diagnosed with rheumatoid arthritis in 2012.  She was initially started on methotrexate as monotherapy but had an inadequate response and was then started on Remicade 6 mg/kg IV infusions every 6 weeks.  Up until the past year she feels as though Remicade and methotrexate  have been effective combination.  Different treatment options were discussed today.  Indications, contraindications, potential side effects of Rinvoq were discussed.  All questions were addressed and consent was obtained today.  We will apply for rinvoq through her insurance. Her last remicade infusion was on 03/16/20. She will initiated on Rinvoq 15 mg 1 tablet by mouth daily after remicade washout period (after 04/27/20).  She will continue on MTX and folic acid as prescribed.  She will follow-up in about 6 weeks with Dr. Estanislado Pandy to assess her response.  Counseled patient that Rinvoq is a JAK inhibitor indicated for Rheumatoid Arthritis.  Counseled patient on purpose, proper use, and adverse effects of Rinvoq.    Reviewed the most common adverse effects including infection, diarrhea, headaches.  Also reviewed rare adverse effects such as bowel injury and the need to contact us if they develop stomach pain during treatment. Counseled on the increase risk of venous thrombosis. Reviewed with patient that there is the possibility of an increased risk of malignancy but it is not well understood if this increased risk is due to the medication or the disease state.  Instructed patient that medication should be held for infection and prior to surgery.  Advised patient to avoid live vaccines. Recommend annual influenza, Pneumovax 23, Prevnar 13, and Shingrix as indicated.   Reviewed importance of routine lab monitoring including lipid panel.  Standing orders placed. Provided patient with medication education material and answered all questions.  Patient consented to Rinvoq.  Will upload into patient's chart.  Will apply through patient's insurance and update when we receive a response.    Patient dose will be 15 mg daily.  Prescription will be sent to pharmacy pending lab results and insurance approval.  High risk medication use -Applying for Rinvoq 15 mg 1 tablet by mouth daily.  She will continue on MTX 0.4 ml  sq injections once weekly and folic acid 1 mg daily.  Inadequate response to Remicade 6 mg/kg IV infusions every 6 weeks-Started in July 2013-last infusion on 03/16/20.  We will obtain the following baseline immunosuppressive lab work-TB gold, SPEP, Immunoglobulins, and hepatitis panel.  She will be due to update CBC and CMP in 1 month then every 3 months. Standing orders for CBC and CMP were placed today.   - Plan: QuantiFERON-TB Gold Plus, Serum protein electrophoresis with reflex, IgG, IgA, IgM, Hepatitis panel, acute She has received both COVID-19 vaccinations.  Fibromyalgia: She has  generalized hyperalgesia and positive tender points on exam.  She has been having more frequent and severe fibromyalgia flares recently.  Last week she was out of work on Tuesday through Friday due to experiencing increased fatigue, depression, and generalized pain.  Chronic fatigue: She has been experiencing increased fatigue especially with recurrent rheumatoid arthritis flares as well as fibromyalgia flares.  We discussed the importance of regular exercise and good sleep hygiene.  Primary insomnia: She has nocturnal pain intermittently which worsens her insomnia.   Other medical conditions are listed as follows:   History of gastroesophageal reflux (GERD)  Anxiety and depression  Recurrent UTI  Orders: Orders Placed This Encounter  Procedures  . QuantiFERON-TB Gold Plus  . Serum protein electrophoresis with reflex  . IgG, IgA, IgM  . Hepatitis panel, acute  . CBC with Differential/Platelet  . COMPLETE METABOLIC PANEL WITH GFR   No orders of the defined types were placed in this encounter.   Follow-Up Instructions: Return in about 6 weeks (around 05/05/2020) for Rheumatoid arthritis, Fibromyalgia.   Ofilia Neas, PA-C  Note - This record has been created using Dragon software.  Chart creation errors have been sought, but may not always  have been located. Such creation errors do not reflect on   the standard of medical care.

## 2020-03-16 ENCOUNTER — Telehealth: Payer: Self-pay | Admitting: Pharmacist

## 2020-03-16 ENCOUNTER — Other Ambulatory Visit: Payer: Self-pay | Admitting: Neurology

## 2020-03-16 NOTE — Telephone Encounter (Signed)
Received fax from [facility] infusion that patient received Remicade 480 mg infusion on 03/16/2020 without issue.  CBC/CMP also drawn and pending results.  Next appointment scheduled for 04/27/2020.   Mariella Saa, PharmD, [place], CPP Clinical Specialty Pharmacist (Rheumatology and Pulmonology)  03/16/2020 11:17 AM

## 2020-03-24 ENCOUNTER — Telehealth: Payer: Self-pay | Admitting: Pharmacist

## 2020-03-24 ENCOUNTER — Encounter: Payer: Self-pay | Admitting: Physician Assistant

## 2020-03-24 ENCOUNTER — Ambulatory Visit: Payer: 59 | Admitting: Physician Assistant

## 2020-03-24 ENCOUNTER — Other Ambulatory Visit: Payer: Self-pay

## 2020-03-24 VITALS — BP 135/84 | HR 85 | Resp 14 | Ht 66.0 in | Wt 176.2 lb

## 2020-03-24 DIAGNOSIS — M797 Fibromyalgia: Secondary | ICD-10-CM | POA: Diagnosis not present

## 2020-03-24 DIAGNOSIS — Z8719 Personal history of other diseases of the digestive system: Secondary | ICD-10-CM

## 2020-03-24 DIAGNOSIS — M0609 Rheumatoid arthritis without rheumatoid factor, multiple sites: Secondary | ICD-10-CM

## 2020-03-24 DIAGNOSIS — F32A Depression, unspecified: Secondary | ICD-10-CM

## 2020-03-24 DIAGNOSIS — F419 Anxiety disorder, unspecified: Secondary | ICD-10-CM

## 2020-03-24 DIAGNOSIS — F5101 Primary insomnia: Secondary | ICD-10-CM

## 2020-03-24 DIAGNOSIS — Z79899 Other long term (current) drug therapy: Secondary | ICD-10-CM | POA: Diagnosis not present

## 2020-03-24 DIAGNOSIS — F329 Major depressive disorder, single episode, unspecified: Secondary | ICD-10-CM

## 2020-03-24 DIAGNOSIS — R5382 Chronic fatigue, unspecified: Secondary | ICD-10-CM | POA: Diagnosis not present

## 2020-03-24 DIAGNOSIS — N390 Urinary tract infection, site not specified: Secondary | ICD-10-CM

## 2020-03-24 NOTE — Telephone Encounter (Signed)
Submitted a Prior Authorization request to [facility] for [facility] via Cover My Meds. Will update once we receive a response.   KeyShonna Chock - PA Case ID: [id number]  Patient added to Complete [employer]- pending benefits investigation

## 2020-03-24 NOTE — Patient Instructions (Addendum)
Standing Labs We placed an order today for your standing lab work.   Please have your standing labs drawn in 1 month then every 3 months   If possible, please have your labs drawn 2 weeks prior to your appointment so that the provider can discuss your results at your appointment.  We have open lab daily Monday through Thursday from 8:30-12:30 PM and 1:30-4:30 PM and Friday from 8:30-12:30 PM and 1:30-4:00 PM at the office of Dr. Bo Merino, [facility].   Please be advised, patients with office appointments requiring lab work will take precedents over walk-in lab work.  If possible, please come for your lab work on Monday and Friday afternoons, as you may experience shorter wait times. The office is located at [address], [place], [place], [place] [address] No appointment is necessary.   Labs are drawn by [facility]. Please bring your co-pay at the time of your lab draw.  You may receive a bill from [facility] for your lab work.  If you wish to have your labs drawn at another location, please call the office 24 hours in advance to send orders.  If you have any questions regarding directions or hours of operation,  please call [phone].   As a reminder, please drink plenty of water prior to coming for your lab work. Thanks!   Upadacitinib extended-release tablets What is this medicine? UPADACITINIB (ue PAD a SYE ti nib) is a medicine that works on the immune system. This medicine is used to treat rheumatoid arthritis. This medicine may be used for other purposes; ask your health care provider or pharmacist if you have questions. COMMON BRAND NAME(S): RINVOQ What should I tell my health care provider before I take this medicine? They need to know if you have any of these conditions:  cancer  diabetes  high cholesterol  HIV or AIDs  immune system problems  infection (especially a virus infection such as hepatitis B, chickenpox, cold sores, or herpes)  liver  disease  low blood counts, like low white cell, platelets, or red cell counts  history of blood clots  lung or breathing disease, like asthma  organ transplant  stomach or intestine problems  tuberculosis, a positive skin test for tuberculosis, or have recently been in close contact with someone who has tuberculosis  an unusual or allergic reaction to upadacitinib, other medicines, foods, dyes or preservatives  pregnant or trying to get pregnant  breast-feeding How should I use this medicine? Take this medicine by mouth with a full glass of water. Follow the directions on the prescription label. Do not cut, crush, or chew this medicine. Swallow the tablets whole. You can take it with or without food. Take your medicine at regular intervals. Do not take more often then directed. Do not stop taking except on your doctor's advice. A special MedGuide will be given to you by the pharmacist with each prescription and refill. Be sure to read this information carefully each time. Talk to your pediatrician regarding the use of this medicine in children. Special care may be needed. Overdosage: If you think you have taken too much of this medicine contact a poison control center or emergency room at once. NOTE: This medicine is only for you. Do not share this medicine with others. What if I miss a dose? If you miss a dose, take it as soon as you can. If it is almost time for your next dose, take only that dose. Do not take double or  extra doses. What may interact with this medicine? Do not take this medicine with any of the following medications:  baricitinib  tofacitinib This medicine may also interact with the following medications:  azathioprine, cyclosporine, or other immunosuppressive drugs  biologic medicines such as abatacept, adalimumab, anakinra, certolizumab, etanercept, golimumab, infliximab, rituximab, secukinumab, tocilizumab, ustekinumab  certain medicines for fungal  infections like ketoconazole, itraconazole, or posaconazole  certain medicines for seizures like carbamazepine, phenobarbital, phenytoin  clarithromycin  live vaccines  rifampin  supplements, such as St. John's wort This list may not describe all possible interactions. Give your health care provider a list of all the medicines, herbs, non-prescription drugs, or dietary supplements you use. Also tell them if you smoke, drink alcohol, or use illegal drugs. Some items may interact with your medicine. What should I watch for while using this medicine? Visit your healthcare professional for regular checks on your progress. Tell your healthcare professional if your symptoms do not start to get better or if they get worse. You may need blood work done while you are taking this medicine. Avoid taking products that contain aspirin, acetaminophen, ibuprofen, naproxen, or ketoprofen unless instructed by your doctor. These medicines may hide a fever. Call your doctor or healthcare professional for advice if you get a fever, chills or sore throat or other symptoms of a cold or flu. Do not treat yourself. This drug decreases your body's ability to fight infections. Try to avoid being around people who are sick. Do not become pregnant while taking this medicine. Women should inform their healthcare professional if they wish to become pregnant or think they might be pregnant. There is potential for serious side effects and harm to an unborn child. Talk to your healthcare professional for more information. Do not breast-feed an infant while taking this medicine or for 6 days after stopping it. What side effects may I notice from receiving this medicine? Side effects that you should report to your doctor or health care professional as soon as possible:  allergic reactions like skin rash, itching or hives, swelling of the face, lips, or tongue  breathing problems  signs and symptoms of a blood clot such as  breathing problems; changes in vision; chest pain; severe, sudden headache; pain, swelling, warmth in the leg; trouble speaking; sudden numbness or weakness of the face, arm, or leg  sign and symptoms of infection like fever or chills; cough; sore throat; pain or trouble passing urine  signs and symptoms of liver injury like dark yellow or brown urine; general ill feeling or flu-like symptoms; light-colored stools; loss of appetite; nausea; right upper belly pain; unusually weak or tired; yellowing of the eyes or skin  stomach pain or a sudden change in bowel habits  unusually weak or tired Side effects that usually do not require medical attention (report these to your doctor or health care professional if they continue or are bothersome):  nausea  runny nose  sinus trouble This list may not describe all possible side effects. Call your doctor for medical advice about side effects. You may report side effects to FDA at 1-800-FDA-1088. Where should I keep my medicine? Keep out of the reach of children. Store between 2 and 25 degrees C (36 and 77 degrees F). Keep this medicine in the original container. Throw away any unused medicine after the expiration date. NOTE: This sheet is a summary. It may not cover all possible information. If you have questions about this medicine, talk to your doctor, pharmacist, or  health care provider.  2020 Elsevier/Gold Standard (2018-02-20 01:45:07)   COVID-19 vaccine recommendations:   COVID-19 vaccine is recommended for everyone (unless you are allergic to a vaccine component), even if you are on a medication that suppresses your immune system.   If you are on Methotrexate, Cellcept (mycophenolate), Rinvoq, Morrie Sheldon, and Olumiant- hold the medication for 1 week after each vaccine. Hold Methotrexate for 2 weeks after the single dose COVID-19 vaccine.   If you are on Orencia subcutaneous injection - hold medication one week prior to and one week after the  first COVID-19 vaccine dose (only).   If you are on Orencia IV infusions- time vaccination administration so that the first COVID-19 vaccination will occur four weeks after the infusion and postpone the subsequent infusion by one week.   If you are on Cyclophosphamide or Rituxan infusions please contact your doctor prior to receiving the COVID-19 vaccine.   Do not take Tylenol or any anti-inflammatory medications (NSAIDs) 24 hours prior to the COVID-19 vaccination.   There is no direct evidence about the efficacy of the COVID-19 vaccine in individuals who are on medications that suppress the immune system.   Even if you are fully vaccinated, and you are on any medications that suppress your immune system, please continue to wear a mask, maintain at least six feet social distance and practice hand hygiene.   If you develop a COVID-19 infection, please contact your PCP or our office to determine if you need antibody infusion.  The booster vaccine is now available for immunocompromised patients. It is advised that if you had Pfizer vaccine you should get Coca-[employer]a booster.  If you had a Moderna vaccine then you should get a Moderna booster. Johnson and Wynetta Emery does not have a booster vaccine at this time.  Please see the following web sites for updated information.   [email or web address]-Patient-Resources.[email or web address]  [email or web address]

## 2020-03-24 NOTE — Progress Notes (Signed)
Pharmacy Note  Subjective: Patient presents today to [facility] for follow up office visit. Patient seen by the pharmacist for counseling on Rinvoq for rheumatoid arthritis..  Previous therapy include:methotrexate monotherapy and methotrexate/Remicade since 2013.  Objective:  CMP     Component Value Date/Time   NA 138 04/03/2019 1612   K 3.9 04/03/2019 1612   CL 105 04/03/2019 1612   CO2 26 04/03/2019 1612   GLUCOSE 102 (H) 04/03/2019 1612   BUN 12 04/03/2019 1612   CREATININE 0.87 04/03/2019 1612   CALCIUM 9.1 04/03/2019 1612   PROT 6.6 04/03/2019 1612   ALBUMIN 3.6 03/13/2019 0826   AST 18 04/03/2019 1612   ALT 13 04/03/2019 1612   ALKPHOS 71 03/13/2019 0826    CBC    Component Value Date/Time   WBC 7.8 05/08/2019 1541   RBC 3.97 05/08/2019 1541   HGB 11.9 05/08/2019 1541   HCT 35.4 05/08/2019 1541   PLT 275 05/08/2019 1541   MCV 89.2 05/08/2019 1541   MCH 30.0 05/08/2019 1541   MCHC 33.6 05/08/2019 1541   RDW 12.9 05/08/2019 1541    Baseline Immunosuppressant Therapy Labs TB GOLD Quantiferon TB Gold Latest Ref Rng & Units 01/30/2019  Quantiferon TB Gold Plus Negative Negative   Hepatitis Panel Hepatitis Latest Ref Rng & Units 09/09/2015  Hep C Ab NEGATIVE NEGATIVE   HIV Lab Results  Component Value Date   HIV NON REACTIVE 04/24/2013   Immunoglobulins Pending 03/24/20  SPEP Pending 03/24/20  G6PD No results found for: G6PDH TPMT No results found for: TPMT   Lipid Panel Lab Results  Component Value Date   CHOL 206 (H) 10/22/2017   HDL 52 10/22/2017   LDLCALC 136 (H) 10/22/2017   TRIG 83 10/22/2017   CHOLHDL 4.0 10/22/2017     Does patient have history of diverticulitis?  No  Assessment/Plan:  Counseled patient that Rinvoq is a JAK inhibitor indicated for Rheumatoid Arthritis.  Counseled patient on purpose, proper use, and adverse effects of Rinvoq.    Reviewed the most common adverse effects including infection, diarrhea,  headaches.  Also reviewed rare adverse effects such as bowel injury and the need to contact us if they develop stomach pain during treatment. Counseled on the increase risk of venous thrombosis. Reviewed with patient that there is the possibility of an increased risk of malignancy but it is not well understood if this increased risk is due to the medication or the disease state.  Instructed patient that medication should be held for infection and prior to surgery.  Advised patient to avoid live vaccines. Recommend annual influenza, Pneumovax 23, Prevnar 13, and Shingrix as indicated.    Reviewed the importance of routine lab monitoring including lipid panel.  Standing orders placed. Provided patient with medication education material and answered all questions.  Patient consented to Rinvoq.  Will upload into patient's chart.  Will apply through patient's insurance and update when we receive a response.  Patient dose will be 15 mg daily.  Prescription will be sent to pharmacy pending lab results and insurance approval.   Mariella Saa, PharmD, [place], CPP Clinical Specialty Pharmacist (Rheumatology and Pulmonology)  03/24/2020 9:38 AM

## 2020-03-24 NOTE — Telephone Encounter (Signed)
Received notification from [facility] regarding a prior authorization for [facility]. Authorization has been APPROVED from 03/24/20 to 03/24/21.   Authorization # [id number]  Awaiting Complete Pro BIV for copay card details.

## 2020-03-24 NOTE — Telephone Encounter (Signed)
Please start benefits investigation for Rinvoq for the treatment of rheumatoid arthritis.  Patient was diagnosed in 2012.  She tried methotrexate monotherapy with inadequate response.  She has been on Remicade with methotrexate since 2013 and now has waning response.  Mariella Saa, PharmD, [place], CPP Clinical Specialty Pharmacist (Rheumatology and Pulmonology)  03/24/2020 9:35 AM

## 2020-03-26 LAB — IGG, IGA, IGM
IgG (Immunoglobin G), Serum: 1213 mg/dL (ref 600–1540)
IgM, Serum: 790 mg/dL — ABNORMAL HIGH (ref 50–300)
Immunoglobulin A: 180 mg/dL (ref 70–320)

## 2020-03-26 LAB — HEPATITIS PANEL, ACUTE
Hep A IgM: NONREACTIVE
Hep B C IgM: NONREACTIVE
Hepatitis B Surface Ag: NONREACTIVE
Hepatitis C Ab: NONREACTIVE
SIGNAL TO CUT-OFF: 0.01 (ref <1.00)

## 2020-03-26 LAB — PROTEIN ELECTROPHORESIS, SERUM, WITH REFLEX
Albumin ELP: 4 g/dL (ref 3.8–4.8)
Alpha 1: 0.3 g/dL (ref 0.2–0.3)
Alpha 2: 0.8 g/dL (ref 0.5–0.9)
Beta 2: 0.3 g/dL (ref 0.2–0.5)
Beta Globulin: 0.4 g/dL (ref 0.4–0.6)
Gamma Globulin: 1.5 g/dL (ref 0.8–1.7)
Total Protein: 7.3 g/dL (ref 6.1–8.1)

## 2020-03-26 LAB — QUANTIFERON-TB GOLD PLUS
Mitogen-NIL: >10 IU/mL
NIL: 0.08 IU/mL
QuantiFERON-TB Gold Plus: NEGATIVE
TB1-NIL: 0.02 IU/mL
TB2-NIL: 0.03 IU/mL

## 2020-03-26 NOTE — Progress Notes (Signed)
Labs are stable.  According to Taylor's note she is waiting to start on Rinvoq after approval from the insurance.

## 2020-03-29 NOTE — Telephone Encounter (Signed)
Rinvoq Copay Card:  Rx [id number] Rx [id number] Rx [id number] Rx [id number]

## 2020-04-01 ENCOUNTER — Telehealth: Payer: Self-pay

## 2020-04-01 MED ORDER — PREDNISONE 5 MG PO TABS: ORAL_TABLET | ORAL | 0 refills | Status: DC

## 2020-04-01 NOTE — Telephone Encounter (Signed)
I attempted to call the patient to discuss  management options but I had to leave a voicemail for her to return my call.

## 2020-04-01 NOTE — Telephone Encounter (Signed)
Patient called stating she was returning [place] call.

## 2020-04-01 NOTE — Telephone Encounter (Signed)
I spoke with the patient about the symptoms she is experiencing currently.  I sent in a prednisone taper starting at 20 mg tapering by 5 mg every week.  She was advised to take prednisone at breakfast with food.

## 2020-04-02 ENCOUNTER — Other Ambulatory Visit: Payer: Self-pay

## 2020-04-02 MED ORDER — RINVOQ 15 MG PO TB24: 15.0000 mg | ORAL_TABLET | Freq: Every day | ORAL | 0 refills | Status: DC

## 2020-04-02 NOTE — Telephone Encounter (Signed)
Last Visit: 03/24/2020 Next Visit: 05/05/2020 Labs: 03/24/2020 Labs are stable TB gold: 03/24/2020 Neg   Per office note on 03/24/2020: Rinvoq 15 mg daily

## 2020-04-02 NOTE — Telephone Encounter (Signed)
[facility] left a voicemail requesting prescription refill of Rinvoq for the patient.   The office can fax, escribe or call with verbal. Phone [phone]  Fax [phone]

## 2020-04-16 ENCOUNTER — Other Ambulatory Visit: Payer: Self-pay | Admitting: Neurology

## 2020-04-20 ENCOUNTER — Encounter: Payer: Self-pay | Admitting: *Deleted

## 2020-04-20 ENCOUNTER — Other Ambulatory Visit: Payer: Self-pay | Admitting: *Deleted

## 2020-04-20 MED ORDER — CLONAZEPAM 0.5 MG PO TABS
0.5000 mg | ORAL_TABLET | Freq: Two times a day (BID) | ORAL | 5 refills | Status: DC | PRN
Start: 2020-04-20 — End: 2020-09-22

## 2020-04-20 NOTE — Progress Notes (Signed)
Meds ordered this encounter  Medications  . clonazePAM (KLONOPIN) 0.5 MG tablet    Sig: Take 1 tablet (0.5 mg total) by mouth 2 (two) times daily as needed. for anxiety    Dispense:  60 tablet    Refill:  5    [place] narcotic registry checked.

## 2020-04-21 ENCOUNTER — Encounter: Payer: Self-pay | Admitting: Family Medicine

## 2020-04-21 ENCOUNTER — Other Ambulatory Visit: Payer: Self-pay

## 2020-04-21 DIAGNOSIS — Z79899 Other long term (current) drug therapy: Secondary | ICD-10-CM

## 2020-04-21 NOTE — Progress Notes (Signed)
Office Visit Note  Patient: Courtney Myers             Date of Birth: May 30, 1961           MRN: [id number]             PCP: Courtney Frizzle, MD Referring: Courtney Frizzle, MD Visit Date: 05/05/2020 Occupation: @GUAROCC @  Subjective:  Medication monitoring   History of Present Illness: Courtney Myers is a 59 y.o. female with history of seronegative rheumatoid arthritis and fibromyalgia.  Patient is taking Rinvoq 15 mg 1 tablet by mouth daily.  She started Rinvoq on 04/28/2020 and has been tolerating it well overall.  She has noticed increased frequency of headaches but is unsure if it is related.  She discontinued prednisone last week.  She is not having any joint pain or joint swelling at this time.  She denies any morning stiffness or nocturnal pain.  She has no difficulty with ADLs at this time.  She has noticed a significant improvement in her symptoms since starting on Rinvoq and completing the prednisone taper.  She denies any fibromyalgia flares recently.  Her fatigue has improved significantly and she has been sleeping better at night.  She denies any myalgias or muscle tenderness at this time. She denies any recent infections.  She received the Shingrix vaccine as well as the annual influenza vaccine prior to starting on Rinvoq.  She is planning on receiving the COVID-19 booster.    Activities of Daily Living:  Patient reports morning stiffness for 0 minutes.   Patient Denies nocturnal pain.  Difficulty dressing/grooming: Denies Difficulty climbing stairs: Denies Difficulty getting out of chair: Denies Difficulty using hands for taps, buttons, cutlery, and/or writing: Reports  Review of Systems  Constitutional: Positive for fatigue.  HENT: Positive for mouth sores, mouth dryness and nose dryness.   Eyes: Positive for dryness. Negative for pain and itching.  Respiratory: Negative for shortness of breath and difficulty breathing.   Cardiovascular: Negative  for chest pain and palpitations.  Gastrointestinal: Negative for blood in stool, constipation and diarrhea.  Endocrine: Negative for increased urination.  Genitourinary: Negative for difficulty urinating and painful urination.  Musculoskeletal: Positive for arthralgias, joint pain and joint swelling. Negative for myalgias, morning stiffness, muscle tenderness and myalgias.  Skin: Negative for color change, rash and redness.  Allergic/Immunologic: Negative for susceptible to infections.  Neurological: Positive for headaches. Negative for dizziness, numbness, memory loss and weakness.  Hematological: Negative for bruising/bleeding tendency.  Psychiatric/Behavioral: Negative for confusion.    PMFS History:  Patient Active Problem List   Diagnosis Date Noted  . Encounter for screening for lung cancer 12/29/2019  . Excessive sleepiness 09/08/2014  . Fibromyalgia syndrome 08/25/2014  . RA (rheumatoid arthritis) ([place])   . GERD (gastroesophageal reflux disease)   . Anxiety   . Depression   . History of shingles   . Synovitis of hand   . Cervical dystonia 12/04/2012    Past Medical History:  Diagnosis Date  . Anxiety   . Cervical dystonia   . Depression   . Fibromyalgia   . GERD (gastroesophageal reflux disease)   . History of shingles   . RA (rheumatoid arthritis) ([place])   . Synovitis of hand     Family History  Problem Relation Age of Onset  . Arthritis Mother   . Heart Problems Father   . Heart disease Father   . Heart disease Paternal Uncle        MI  Past Surgical History:  Procedure Laterality Date  . TONSILLECTOMY    . 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 Right handed.    Caffeine- 4 cups daily.   Immunization History  Administered Date(s) Administered  . Influenza,inj,Quad PF,6+ Mos 05/25/2018,  04/03/2019, 04/26/2020  . Influenza-Unspecified 06/24/2014, 05/01/2015, 04/10/2016, 04/06/2017  . Moderna SARS-COVID-2 Vaccination 08/29/2019, 09/26/2019  . Pneumococcal Conjugate-13 05/01/2015  . Pneumococcal Polysaccharide-23 04/10/2016  . Tdap 09/09/2015     Objective: Vital Signs: BP 121/84 (BP Location: Left Arm, Patient Position: Sitting, Cuff Size: Normal)   Pulse 77   Resp 14   Ht 5\' 7"  (1.702 m)   Wt 182 lb 6.4 oz (82.7 kg)   BMI 28.57 kg/m    Physical Exam Vitals and nursing note reviewed.  Constitutional:      Appearance: She is well-developed.  HENT:     Head: Normocephalic and atraumatic.  Eyes:     Conjunctiva/sclera: Conjunctivae normal.  Cardiovascular:     Rate and Rhythm: Normal rate.  Pulmonary:     Effort: Pulmonary effort is normal.  Abdominal:     Palpations: Abdomen is soft.  Musculoskeletal:     Cervical back: Normal range of motion.  Lymphadenopathy:     Cervical: No cervical adenopathy.  Skin:    General: Skin is warm and dry.     Capillary Refill: Capillary refill takes less than 2 seconds.  Neurological:     Mental Status: She is alert and oriented to person, place, and time.  Psychiatric:        Behavior: Behavior normal.      Musculoskeletal Exam: C-spine, thoracic spine, and lumbar spine good ROM.  Shoulder joints, elbow joints, wrist joints, MCPs, PIPs, and DIPs good ROM with no synovitis.  Complete fist formation bilaterally. 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 Hip joints, knee joints, and ankle joints good ROM with no discomfort.  No warmth or effusion of knee joints.  No tenderness or swelling of ankle joints.    CDAI Exam: CDAI Score: 0.2  Patient Global: 1 mm; Provider Global: 1 mm Swollen: 0 ; Tender: 0  Joint Exam 05/05/2020   No joint exam has been documented for this visit   There is currently no information documented on the homunculus. Go to the Rheumatology activity and complete the homunculus joint  exam.  Investigation: No additional findings.  Imaging: No results found.  Recent Labs: Lab Results  Component Value Date   WBC 7.5 04/21/2020   HGB 12.8 04/21/2020   PLT 231 04/21/2020   NA 138 04/21/2020   K 4.2 04/21/2020   CL 102 04/21/2020   CO2 27 04/21/2020   GLUCOSE 165 (H) 04/21/2020   BUN 11 04/21/2020   CREATININE 1.04 (H) 04/21/2020   BILITOT 0.4 04/21/2020   ALKPHOS 71 03/13/2019   AST 19 04/21/2020   ALT 14 04/21/2020   PROT 7.1 04/21/2020   ALBUMIN 3.6 03/13/2019   CALCIUM 9.5 04/21/2020   GFRAA 67 04/21/2020   QFTBGOLD Negative 09/14/2015   QFTBGOLDPLUS NEGATIVE 03/24/2020    Speciality Comments: Remicade 6mg /kg every 6 weeks @ [facility]   Procedures:  No procedures performed Allergies: Patient has no known allergies.   Assessment / Plan:     Visit Diagnoses: Rheumatoid arthritis of multiple sites with negative rheumatoid factor (HCC) - Inadequate response to  Remicade 6 mg/kg IV infusions every 6 weeks-Started in July 2013-last infusion on 03/16/20: She has no joint tenderness or synovitis on exam.  She has noticed significant clinical improvement since starting on Rinvoq 15 mg 1 tablet by mouth daily 1 week ago (on 04/28/20).  She has completed the prednisone taper and has not had any return of her symptoms.  She has no joint pain, joint swelling, morning stiffness, nocturnal pain.  She has not had any difficulty with ADLs.  Her fatigue has improved significantly and she has been sleeping better at night.  She plans on starting to exercise on a regular basis since her joint pain has resolved.  She will continue taking Rinvoq 15 mg 1 tablet by mouth daily, methotrexate 0.3 mL sq injections once weekly, folic acid 1 mg by mouth daily.  She will follow-up in the office in 2 months to reassess how she is doing on Rinvoq.   High risk medication use - Rinvoq 15 mg 1 tablet by mouth daily, MTX 0.3 ml sq injections once weekly and folic acid 1 mg daily.  She  started on Rinvoq on 04/28/2020.  CBC and CMP were updated on 04/21/2020.  She will return for lab work in 3 to 4 weeks.  Standing orders for CBC and CMP remain in place.  Future order for lipid panel was placed today.  TB gold negative on 03/24/2020 and will be to be monitored yearly. She has not had any recent infections.  She has received the Shingrix and annual influenza vaccine prior to starting on Rinvoq.  She is planning on receiving the COVID-19 booster soon.  She is aware to hold Rinvoq 1 week after receiving the booster dose.  Fibromyalgia: She has not had any recent fibromyalgia flares.  Her myalgias and muscle tenderness have become significantly less frequent since her rheumatoid arthritis has started to be better controlled.  Her fatigue has improved significantly and she has been sleeping well at night.  She plans on starting to exercise on a regular basis.  Primary insomnia: She has been sleeping better at night.  She has not been experiencing any nocturnal pain.  Chronic fatigue: Her fatigue has improved significantly.  She was encouraged start exercising on a regular basis.  Other medical conditions are listed as follows:  History of gastroesophageal reflux (GERD)  Anxiety and depression  Recurrent UTI  Orders: Orders Placed This Encounter  Procedures  . Lipid panel   No orders of the defined types were placed in this encounter.   Follow-Up Instructions: Return in about 2 months (around 07/05/2020) for Rheumatoid arthritis, Fibromyalgia.   Ofilia Neas, PA-C  Note - This record has been created using Dragon software.  Chart creation errors have been sought, but may not always  have been located. Such creation errors do not reflect on  the standard of medical care.

## 2020-04-22 LAB — COMPLETE METABOLIC PANEL WITH GFR
AG Ratio: 1.4 (calc) (ref 1.0–2.5)
ALT: 14 U/L (ref 6–29)
AST: 19 U/L (ref 10–35)
Albumin: 4.1 g/dL (ref 3.6–5.1)
Alkaline phosphatase (APISO): 80 U/L (ref 37–153)
BUN/Creatinine Ratio: 11 (calc) (ref 6–22)
BUN: 11 mg/dL (ref 7–25)
CO2: 27 mmol/L (ref 20–32)
Calcium: 9.5 mg/dL (ref 8.6–10.4)
Chloride: 102 mmol/L (ref 98–110)
Creat: 1.04 mg/dL — ABNORMAL HIGH (ref 0.50–0.99)
GFR, Est African American: 67 mL/min/{1.73_m2} (ref >=60)
GFR, Est Non African American: 58 mL/min/{1.73_m2} — ABNORMAL LOW (ref >=60)
Globulin: 3 g/dL (calc) (ref 1.9–3.7)
Glucose, Bld: 165 mg/dL — ABNORMAL HIGH (ref 65–99)
Potassium: 4.2 mmol/L (ref 3.5–5.3)
Sodium: 138 mmol/L (ref 135–146)
Total Bilirubin: 0.4 mg/dL (ref 0.2–1.2)
Total Protein: 7.1 g/dL (ref 6.1–8.1)

## 2020-04-22 LAB — CBC WITH DIFFERENTIAL/PLATELET
Absolute Monocytes: 300 cells/uL (ref 200–950)
Basophils Absolute: 38 cells/uL (ref 0–200)
Basophils Relative: 0.5 %
Eosinophils Absolute: 30 cells/uL (ref 15–500)
Eosinophils Relative: 0.4 %
HCT: 38.2 % (ref 35.0–45.0)
Hemoglobin: 12.8 g/dL (ref 11.7–15.5)
Lymphs Abs: 1620 cells/uL (ref 850–3900)
MCH: 30.5 pg (ref 27.0–33.0)
MCHC: 33.5 g/dL (ref 32.0–36.0)
MCV: 91 fL (ref 80.0–100.0)
MPV: 11.5 fL (ref 7.5–12.5)
Monocytes Relative: 4 %
Neutro Abs: 5513 cells/uL (ref 1500–7800)
Neutrophils Relative %: 73.5 %
Platelets: 231 10*3/uL (ref 140–400)
RBC: 4.2 10*6/uL (ref 3.80–5.10)
RDW: 12.9 % (ref 11.0–15.0)
Total Lymphocyte: 21.6 %
WBC: 7.5 10*3/uL (ref 3.8–10.8)

## 2020-04-22 NOTE — Progress Notes (Signed)
Glucose is elevated-165. Creatinine is mildly elevated and GFR is borderline low-58.  Please advise the patient to avoid taking NSAIDs.  She should reduce MTX to 0.3 ml sq injections once weekly.  Please ask the patient if she has noticed any improvement taking rinvoq.   CBC WNL.

## 2020-04-26 ENCOUNTER — Other Ambulatory Visit: Payer: Self-pay

## 2020-04-26 ENCOUNTER — Ambulatory Visit: Payer: 59 | Admitting: Family Medicine

## 2020-04-26 VITALS — BP 124/80 | HR 108 | Temp 97.9°F | Ht 66.0 in | Wt 173.0 lb

## 2020-04-26 DIAGNOSIS — Z23 Encounter for immunization: Secondary | ICD-10-CM

## 2020-04-26 DIAGNOSIS — R399 Unspecified symptoms and signs involving the genitourinary system: Secondary | ICD-10-CM

## 2020-04-26 LAB — URINALYSIS, ROUTINE W REFLEX MICROSCOPIC
Bacteria, UA: NONE SEEN /HPF
Bilirubin Urine: NEGATIVE
Glucose, UA: NEGATIVE
Hyaline Cast: NONE SEEN /LPF
Ketones, ur: NEGATIVE
Leukocytes,Ua: NEGATIVE
Nitrite: NEGATIVE
Protein, ur: NEGATIVE
Specific Gravity, Urine: 1.01 (ref 1.001–1.03)
WBC, UA: NONE SEEN /HPF (ref 0–5)
pH: 6 (ref 5.0–8.0)

## 2020-04-26 LAB — MICROSCOPIC MESSAGE

## 2020-04-26 NOTE — Progress Notes (Signed)
Subjective:    Patient ID: Courtney Myers, female    DOB: 1960/07/27, 59 y.o.   MRN: [id number]  HPI   05/08/19 Patient is a very kind 59 year old Caucasian female who presents today for follow-up.  She was recently seen on November 2 at an urgent care with symptoms consistent with a urinary tract infection.  The point-of-care urinalysis suggested a urinary tract infection.  She was appropriately started on Macrobid for empiric therapy.  However symptoms have worsened over the last week despite taking the antibiotic.  She continues to complain of dysuria, frequency, and pressure.  She is also starting to complain of systemic symptoms as well including body aches and joint pains and chills.  However urine culture just returned today showing that she had Klebsiella pneumonia and that there was intermediate resistance to Macrobid.  This is most likely the reason that she is not improving and also starting to demonstrate signs of a systemic illness.  At that time, my plan was Culture shows sensitivity to cefazolin.  Therefore I will start the patient on Keflex 500 mg p.o. 3 times daily for 7 days given the complicated infection.  While the patient is here I will repeat a CBC.  She was diagnosed with mild iron deficiency anemia at her last visit.  Symptomatically she is doing better since taking the iron.  Once hemoglobin is back to normal, I would discontinue iron and replace with a simple daily multivitamin.  Await the results of the CBC.  If symptoms do not improve dramatically on Keflex, I would switch to Cipro and begin a work-up for other potential infections.  06/05/19  Patient states that her symptoms completely went away after she finished her last antibiotics however less than 2 weeks later, the patient has again developed symptoms of a bladder infection.  She reports frequency, urgency, hesitancy, and dysuria.  Urinalysis shows +2 blood and +2 leukocyte esterase.  The patient is not sexually  active and therefore she does not believe intercourse is the cause of her recurrent infections.  She wipes properly front to back.  She does have episodes of urinary incontinence and she does use a pad that she seldom changes during the day which might be a source of infection.  She also reports severe vaginal dryness that could also be a source of the infection.  In fact she would be interested in treating the vaginal dryness particular if it would help prevent recurrent infections.  At that time, my plan was: Urinalysis suggest another urinary tract infection.  This is become frequent and recurrent for the patient.  We will treat this with Cipro 500 mg p.o. twice daily for 7 days.  We will also focus on prevention.  Patient will change her incontinence pad more frequently and she will also try Estrace 1 g intravaginally 3-4 nights a week to improve vaginal dryness to see if this helps decrease the frequency of the infections and treat her vaginal dryness.  We discussed the risk theoretical of estrogen replacement including breast cancer, endometrial cancer, strokes and DVTs and the patient is willing to accept the very slight theoretical risk of topical estrogen  04/26/20 Patient saw urology earlier this year.  She underwent cystoscopy.  They saw no cause for her recurrent urinary tract infections.  However she saw no benefit from using the vaginal estrogen cream so she stopped it.  Last week she developed low back pain, urgency, and frequency.  She started drinking lots of water and  drinking cranberry juice and her symptoms have improved over the weekend.  Today she feels normal.  Urinalysis shows +3 blood but is otherwise normal.  She has a history of chronic hematuria that is well-documented and has been worked up in the past. Past Medical History:  Diagnosis Date  . Anxiety   . Cervical dystonia   . Depression   . Fibromyalgia   . GERD (gastroesophageal reflux disease)   . History of shingles   .  RA (rheumatoid arthritis) ([place])   . Synovitis of hand    Past Surgical History:  Procedure Laterality Date  . TONSILLECTOMY    . VAGINAL HYSTERECTOMY  3/08   Current Outpatient Medications on File Prior to Visit  Medication Sig Dispense Refill  . acetaminophen (TYLENOL) 325 MG tablet Take 650 mg by mouth once. One hour prior to infusion    . baclofen (LIORESAL) 10 MG tablet Take 10 mg by mouth daily as needed for muscle spasms.     . clonazePAM (KLONOPIN) 0.5 MG tablet Take 1 tablet (0.5 mg total) by mouth 2 (two) times daily as needed. for anxiety 60 tablet 5  . CRANBERRY PO Take 4 capsules by mouth daily. Two in the morning, two at night - patient unsure of dose    . diphenhydrAMINE (BENADRYL) 25 mg capsule Take 25 mg by mouth once. One hour prior to infusion    . DULoxetine (CYMBALTA) 60 MG capsule TAKE 1 CAPSULE(60 MG) BY MOUTH DAILY 30 capsule 2  . DYSPORT 500 units SOLR injection INJECT 500 UNITS  INTRAMUSCULARLY EVERY 3  MONTHS (GIVEN AT MD OFFICE, DISCARD UNUSED) 1 each 3  . folic acid (FOLVITE) 1 MG tablet TAKE 1 TABLET(1 MG) BY MOUTH DAILY 90 tablet 4  . inFLIXimab (REMICADE) 100 MG injection Inject 6 mg/kg into the vein every 6 (six) weeks. Infuse 500 mg/ 5 vials in 263mL NS over 2hours every 6 weeks    . methotrexate 50 MG/2ML injection INJECT 0.4 ML INTO THE SKIN ONCE A WEEK. 6 mL 0  . Multiple Vitamins-Minerals (MULTIVITAMINS THER. W/MINERALS) TABS Take 1 tablet by mouth daily.    Marland Kitchen omeprazole (PRILOSEC) 20 MG capsule TAKE 1 CAPSULE BY MOUTH EVERY DAY 90 capsule 3  . predniSONE (DELTASONE) 5 MG tablet Take 4 tablets by mouth daily x1 wk, 3 tablets by mouth daily x1wk, 2 tablets by mouth daily x1 wk, 1 tablet by mouth daily x1wk. 70 tablet 0  . traMADol (ULTRAM) 50 MG tablet Take 1 - 2 every 8 hours as needed for pain. 30 tablet 0  . TUBERCULIN SYR 1CC/27GX1/2" (B-D TB SYRINGE 1CC/27GX1/2") 27G X 1/2" 1 ML MISC USE ONE SYRINGE ONCE WEEKLY AS DIRECTED 4 each 11  . Upadacitinib ER  (RINVOQ) 15 MG TB24 Take 15 mg by mouth daily. 90 tablet 0  . Xylitol (XYLIMELTS MT) Use as directed in the mouth or throat.     No current facility-administered medications on file prior to visit.   No Known Allergies Social History   Socioeconomic History  . Marital status: Married    Spouse name: [name]  . Number of children: 2  . Years of education: 60  . Highest education level: Not on file  Occupational History    Employer: [employer]  Tobacco Use  . Smoking status: Current Every Day Smoker    Packs/day: 1.00    Years: 34.00    Pack years: 34.00    Types: Cigarettes    Start date: 15  .  Smokeless tobacco: Never Used  Vaping Use  . Vaping Use: Never used  Substance and Sexual Activity  . Alcohol use: Yes    Alcohol/week: 0.0 standard drinks    Comment: Rare, twice per year  . Drug use: Never  . Sexual activity: Yes    Birth control/protection: Surgical    Comment: 1st intercourse 59 yo-Fewer than 5 partners  Other Topics Concern  . 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 Right handed.    Caffeine- 4 cups daily.   Social Determinants of Health   Financial Resource Strain:   . Difficulty of Paying Living Expenses: Not on file  Food Insecurity:   . Worried About Charity fundraiser in the Last Year: Not on file  . Ran Out of Food in the Last Year: Not on file  Transportation Needs:   . Lack of Transportation (Medical): Not on file  . Lack of Transportation (Non-Medical): Not on file  Physical Activity:   . Days of Exercise per Week: Not on file  . Minutes of Exercise per Session: Not on file  Stress:   . Feeling of Stress : Not on file  Social Connections:   . Frequency of Communication with Friends and Family: Not on file  . Frequency of Social Gatherings with Friends and Family: Not on file  . Attends Religious Services: Not on  file  . Active Member of Clubs or Organizations: Not on file  . Attends Archivist Meetings: Not on file  . Marital Status: Not on file  Intimate Partner Violence:   . Fear of Current or Ex-Partner: Not on file  . Emotionally Abused: Not on file  . Physically Abused: Not on file  . Sexually Abused: Not on file     Review of Systems  All other systems reviewed and are negative.      Objective:   Physical Exam Vitals reviewed.  Constitutional:      Appearance: Normal appearance.  Cardiovascular:     Rate and Rhythm: Normal rate and regular rhythm.  Pulmonary:     Effort: Pulmonary effort is normal.     Breath sounds: Normal breath sounds.  Neurological:     Mental Status: She is alert.           Assessment & Plan:  UTI symptoms - Plan: Urinalysis, Routine w reflex microscopic, Urine Culture, Urine Culture  Need for immunization against influenza - Plan: Flu Vaccine QUAD 36+ mos IM  Urinalysis shows her baseline chronic hematuria but is otherwise normal.  No evidence of a urinary tract infection.  I will send a urine culture.  Patient received her flu shot today

## 2020-04-27 LAB — URINE CULTURE
MICRO NUMBER:: 11113783
MICRO NUMBER:: 11113780
Result:: NO GROWTH
Result:: NO GROWTH
SPECIMEN QUALITY:: ADEQUATE
SPECIMEN QUALITY:: ADEQUATE

## 2020-05-03 ENCOUNTER — Telehealth: Payer: Self-pay | Admitting: Neurology

## 2020-05-03 NOTE — Telephone Encounter (Signed)
Patient has a Dysport appointment on 11/17. I called [facility] and spoke with Lelan Pons to see if delivery could be scheduled. Dysport TBD 11/9.

## 2020-05-04 NOTE — Telephone Encounter (Signed)
[facility]) called to confirm order shipment for  DYSPORT 500 units SOLR injection. Contact [phone].

## 2020-05-05 ENCOUNTER — Ambulatory Visit: Payer: 59 | Admitting: Physician Assistant

## 2020-05-05 ENCOUNTER — Other Ambulatory Visit: Payer: Self-pay

## 2020-05-05 ENCOUNTER — Encounter: Payer: Self-pay | Admitting: Physician Assistant

## 2020-05-05 VITALS — BP 121/84 | HR 77 | Resp 14 | Ht 67.0 in | Wt 182.4 lb

## 2020-05-05 DIAGNOSIS — Z8719 Personal history of other diseases of the digestive system: Secondary | ICD-10-CM

## 2020-05-05 DIAGNOSIS — F419 Anxiety disorder, unspecified: Secondary | ICD-10-CM

## 2020-05-05 DIAGNOSIS — M797 Fibromyalgia: Secondary | ICD-10-CM | POA: Diagnosis not present

## 2020-05-05 DIAGNOSIS — Z79899 Other long term (current) drug therapy: Secondary | ICD-10-CM | POA: Diagnosis not present

## 2020-05-05 DIAGNOSIS — F5101 Primary insomnia: Secondary | ICD-10-CM | POA: Diagnosis not present

## 2020-05-05 DIAGNOSIS — F32A Depression, unspecified: Secondary | ICD-10-CM

## 2020-05-05 DIAGNOSIS — R5382 Chronic fatigue, unspecified: Secondary | ICD-10-CM

## 2020-05-05 DIAGNOSIS — M0609 Rheumatoid arthritis without rheumatoid factor, multiple sites: Secondary | ICD-10-CM

## 2020-05-05 DIAGNOSIS — N390 Urinary tract infection, site not specified: Secondary | ICD-10-CM

## 2020-05-05 NOTE — Patient Instructions (Addendum)
Standing Labs We placed an order today for your standing lab work.   Please have your standing labs drawn in 3-4 weeks then every 3 months   If possible, please have your labs drawn 2 weeks prior to your appointment so that the provider can discuss your results at your appointment.  We have open lab daily Monday through Thursday from 8:30-12:30 PM and 1:30-4:30 PM and Friday from 8:30-12:30 PM and 1:30-4:00 PM at the office of Dr. Bo Merino, [facility].   Please be advised, patients with office appointments requiring lab work will take precedents over walk-in lab work.  If possible, please come for your lab work on Monday and Friday afternoons, as you may experience shorter wait times. The office is located at [address] [place], [place], [place] [address] No appointment is necessary.   Labs are drawn by [facility]. Please bring your co-pay at the time of your lab draw.  You may receive a bill from [facility] [place] for your lab work.  If you wish to have your labs drawn at another location, please call the office 24 hours in advance to send orders.  If you have any questions regarding directions or hours of operation,  please call [phone].   As a reminder, please drink plenty of water prior to coming for your lab work. Thanks!   COVID-19 vaccine recommendations:   COVID-19 vaccine is recommended for everyone (unless you are allergic to a vaccine component), even if you are on a medication that suppresses your immune system.   If you are on Methotrexate, Cellcept (mycophenolate), Rinvoq, Morrie Sheldon, and Olumiant- hold the medication for 1 week after each vaccine. Hold Methotrexate for 2 weeks after the single dose COVID-19 vaccine.   If you are on Orencia subcutaneous injection - hold medication one week prior to and one week after the first COVID-19 vaccine dose (only).   If you are on Orencia IV infusions- time vaccination administration so that the first COVID-19  vaccination will occur four weeks after the infusion and postpone the subsequent infusion by one week.   If you are on Cyclophosphamide or Rituxan infusions please contact your doctor prior to receiving the COVID-19 vaccine.   Do not take Tylenol or any anti-inflammatory medications (NSAIDs) 24 hours prior to the COVID-19 vaccination.   There is no direct evidence about the efficacy of the COVID-19 vaccine in individuals who are on medications that suppress the immune system.   Even if you are fully vaccinated, and you are on any medications that suppress your immune system, please continue to wear a mask, maintain at least six feet social distance and practice hand hygiene.   If you develop a COVID-19 infection, please contact your PCP or our office to determine if you need monoclonal antibody infusion.  The booster vaccine is now available for immunocompromised patients.   Please see the following web sites for updated information.   [email or web address]

## 2020-05-11 NOTE — Telephone Encounter (Signed)
I received (1) 500U vial of Dysport today from [facility] for patient's 11/17 appointment.

## 2020-05-17 ENCOUNTER — Other Ambulatory Visit: Payer: Self-pay | Admitting: Family Medicine

## 2020-05-17 DIAGNOSIS — K219 Gastro-esophageal reflux disease without esophagitis: Secondary | ICD-10-CM

## 2020-05-18 ENCOUNTER — Other Ambulatory Visit: Payer: Self-pay | Admitting: Rheumatology

## 2020-05-19 ENCOUNTER — Ambulatory Visit: Payer: 59 | Admitting: Neurology

## 2020-05-21 ENCOUNTER — Emergency Department (HOSPITAL_COMMUNITY)
Admission: EM | Admit: 2020-05-21 | Discharge: 2020-05-22 | Disposition: A | Discharge status: Home / Self Care | Payer: 59 | Attending: Emergency Medicine | Admitting: Emergency Medicine

## 2020-05-21 ENCOUNTER — Other Ambulatory Visit: Payer: Self-pay

## 2020-05-21 ENCOUNTER — Encounter (HOSPITAL_COMMUNITY): Payer: Self-pay | Admitting: Emergency Medicine

## 2020-05-21 ENCOUNTER — Emergency Department (HOSPITAL_COMMUNITY): Payer: 59

## 2020-05-21 DIAGNOSIS — R0789 Other chest pain: Secondary | ICD-10-CM

## 2020-05-21 DIAGNOSIS — F17210 Nicotine dependence, cigarettes, uncomplicated: Secondary | ICD-10-CM | POA: Insufficient documentation

## 2020-05-21 DIAGNOSIS — Z79899 Other long term (current) drug therapy: Secondary | ICD-10-CM | POA: Insufficient documentation

## 2020-05-21 LAB — BASIC METABOLIC PANEL
Anion gap: 9 (ref 5–15)
BUN: 17 mg/dL (ref 8–23)
CO2: 26 mmol/L (ref 22–32)
Calcium: 9.5 mg/dL (ref 8.9–10.3)
Chloride: 103 mmol/L (ref 98–111)
Creatinine, Ser: 1.18 mg/dL — ABNORMAL HIGH (ref 0.44–1.00)
GFR, Estimated: 53 mL/min — ABNORMAL LOW (ref >60)
Glucose, Bld: 161 mg/dL — ABNORMAL HIGH (ref 70–99)
Potassium: 3.6 mmol/L (ref 3.5–5.1)
Sodium: 138 mmol/L (ref 135–145)

## 2020-05-21 LAB — CBC
HCT: 40.4 % (ref 36.0–46.0)
Hemoglobin: 13 g/dL (ref 12.0–15.0)
MCH: 29.8 pg (ref 26.0–34.0)
MCHC: 32.2 g/dL (ref 30.0–36.0)
MCV: 92.7 fL (ref 80.0–100.0)
Platelets: 258 10*3/uL (ref 150–400)
RBC: 4.36 MIL/uL (ref 3.87–5.11)
RDW: 13.4 % (ref 11.5–15.5)
WBC: 15 10*3/uL — ABNORMAL HIGH (ref 4.0–10.5)
nRBC: 0 % (ref 0.0–0.2)

## 2020-05-21 LAB — TROPONIN I (HIGH SENSITIVITY)
Troponin I (High Sensitivity): 5 ng/L (ref <18)
Troponin I (High Sensitivity): 4 ng/L (ref <18)

## 2020-05-21 MED ORDER — MORPHINE SULFATE (PF) 4 MG/ML IV SOLN
4.0000 mg | Freq: Once | INTRAVENOUS | Status: AC
  Administered 2020-05-21: 4 mg via INTRAVENOUS
  Filled 2020-05-21: qty 1

## 2020-05-21 MED ORDER — HYDROCODONE-ACETAMINOPHEN 5-325 MG PO TABS
1.0000 | ORAL_TABLET | Freq: Four times a day (QID) | ORAL | 0 refills | Status: DC | PRN
Start: 2020-05-21 — End: 2020-08-06

## 2020-05-21 NOTE — ED Triage Notes (Signed)
Chest pain started at 09:15 this am, has had costochondritis in past states this is worse than that pain.  No N/V, no radiation-- hurts to breath,

## 2020-05-21 NOTE — ED Provider Notes (Signed)
[facility] EMERGENCY DEPARTMENT Provider Note   CSN: [id number] Arrival date & time: 05/21/20  1237     History Chief Complaint  Patient presents with  . Chest Pain    Courtney Myers is a 59 y.o. female.  59 year old female with prior medical history detailed below presents for evaluation of chest discomfort.  Patient reports multiple prior episodes of anterior chest wall pain secondary to reported costochondritis.  Patient reports onset of similar pain today.  Patient reports that her pain and she is experiencing since this morning is consistent with typical costochondritis.  She not take anything for pain prior to arrival.  Patient denies associated shortness of breath, diaphoresis, nausea, vomiting, or other complaint.  The history is provided by the patient and medical records.  Chest Pain Chest pain location: anteior chest. Pain quality: sharp and stabbing   Pain radiates to:  Does not radiate Pain severity:  Mild Onset quality:  Sudden Duration:  6 hours Timing:  Constant Progression:  Waxing and waning Chronicity:  New Relieved by:  Nothing Worsened by:  Nothing      Past Medical History:  Diagnosis Date  . Anxiety   . Cervical dystonia   . Depression   . Fibromyalgia   . GERD (gastroesophageal reflux disease)   . History of shingles   . RA (rheumatoid arthritis) ([place])   . Synovitis of hand     Patient Active Problem List   Diagnosis Date Noted  . Encounter for screening for lung cancer 12/29/2019  . Excessive sleepiness 09/08/2014  . Fibromyalgia syndrome 08/25/2014  . RA (rheumatoid arthritis) ([place])   . GERD (gastroesophageal reflux disease)   . Anxiety   . Depression   . History of shingles   . Synovitis of hand   . Cervical dystonia 12/04/2012    Past Surgical History:  Procedure Laterality Date  . TONSILLECTOMY    . VAGINAL HYSTERECTOMY  3/08     OB History    Gravida  4   Para  2   Term      Preterm   2   AB  2   Living  2     SAB  1   TAB      Ectopic      Multiple      Live Births              Family History  Problem Relation Age of Onset  . Arthritis Mother   . Heart Problems Father   . Heart disease Father   . Heart disease Paternal Uncle        MI    Social History   Tobacco Use  . Smoking status: Current Every Day Smoker    Packs/day: 1.00    Years: 34.00    Pack years: 34.00    Types: Cigarettes    Start date: 9  . Smokeless tobacco: Never Used  Vaping Use  . Vaping Use: Never used  Substance Use Topics  . Alcohol use: Yes    Alcohol/week: 0.0 standard drinks    Comment: Rare, twice per year  . Drug use: Never    Home Medications Prior to Admission medications   Medication Sig Start Date End Date Taking? Authorizing Provider  baclofen (LIORESAL) 10 MG tablet Take 10 mg by mouth daily as needed for muscle spasms.  05/01/13  Yes [provider]  clonazePAM (KLONOPIN) 0.5 MG tablet Take 1 tablet (0.5 mg total) by mouth 2 (  two) times daily as needed. for anxiety Patient taking differently: Take 0.5 mg by mouth 2 (two) times daily as needed for anxiety.  04/20/20  Yes Marcial Pacas, MD  CRANBERRY PO Take 2 capsules by mouth in the morning and at bedtime.    Yes [provider]  DULoxetine (CYMBALTA) 60 MG capsule TAKE 1 CAPSULE(60 MG) BY MOUTH DAILY Patient taking differently: Take 60 mg by mouth daily.  05/18/20  Yes Ofilia Neas, PA-C  folic acid (FOLVITE) 1 MG tablet TAKE 1 TABLET(1 MG) BY MOUTH DAILY Patient taking differently: Take 1 mg by mouth at bedtime.  10/20/19  Yes Ofilia Neas, PA-C  methotrexate 50 MG/2ML injection INJECT 0.4 ML INTO THE SKIN ONCE A WEEK. Patient taking differently: Inject 10 mg into the muscle every Tuesday.  01/12/20  Yes Ofilia Neas, PA-C  Multiple Vitamins-Minerals (MULTIVITAMINS THER. W/MINERALS) TABS Take 1 tablet by mouth at bedtime.    Yes [provider]  omeprazole (PRILOSEC)  20 MG capsule TAKE 1 CAPSULE BY MOUTH  EVERY DAY Patient taking differently: Take 20 mg by mouth daily.  05/17/20  Yes Mart, Modena Nunnery, MD  traMADol (ULTRAM) 50 MG tablet Take 1 - 2 every 8 hours as needed for pain. Patient taking differently: Take 50-100 mg by mouth every 8 (eight) hours as needed (pain).  10/22/17  Yes Dixon, Mary B, PA-C  Upadacitinib ER (RINVOQ) 15 MG TB24 Take 15 mg by mouth daily. Patient taking differently: Take 15 mg by mouth at bedtime.  04/02/20  Yes Deveshwar, Abel Presto, MD  Xylitol (XYLIMELTS MT) Use as directed 2 tablets in the mouth or throat at bedtime as needed (dry mouth).    Yes [provider]  DYSPORT 500 units SOLR injection INJECT 500 UNITS  INTRAMUSCULARLY EVERY 3  MONTHS (GIVEN AT MD OFFICE, DISCARD UNUSED) Patient taking differently: Inject 500 Units into the muscle every 3 (three) months. Given at md office 03/16/20   Marcial Pacas, MD  predniSONE (DELTASONE) 5 MG tablet Take 4 tablets by mouth daily x1 wk, 3 tablets by mouth daily x1wk, 2 tablets by mouth daily x1 wk, 1 tablet by mouth daily x1wk. Patient not taking: Reported on 05/05/2020 04/01/20   Ofilia Neas, PA-C  TUBERCULIN SYR 1CC/27GX1/2" (B-D TB SYRINGE 1CC/27GX1/2") 27G X 1/2" 1 ML MISC USE ONE SYRINGE ONCE WEEKLY AS DIRECTED 02/27/20   Bo Merino, MD    Allergies    Patient has no known allergies.  Review of Systems   Review of Systems  Cardiovascular: Positive for chest pain.  All other systems reviewed and are negative.   Physical Exam Updated Vital Signs BP 119/77   Pulse 95   Temp 97.9 F (36.6 C) (Oral)   Resp 20   Ht 5\' 7"  (1.702 m)   Wt 82.6 kg   SpO2 97%   BMI 28.51 kg/m   Physical Exam Vitals and nursing note reviewed.  Constitutional:      General: She is not in acute distress.    Appearance: She is well-developed.  HENT:     Head: Normocephalic and atraumatic.  Eyes:     Conjunctiva/sclera: Conjunctivae normal.     Pupils: Pupils are equal, round,  and reactive to light.  Cardiovascular:     Rate and Rhythm: Normal rate and regular rhythm.     Heart sounds: Normal heart sounds.  Pulmonary:     Effort: Pulmonary effort is normal. No respiratory distress.     Breath sounds:  Normal breath sounds.  Chest:     Chest wall: Tenderness present.     Comments: Reproducible anterior chest wall tenderness Abdominal:     General: There is no distension.     Palpations: Abdomen is soft.     Tenderness: There is no abdominal tenderness.  Musculoskeletal:        General: No deformity. Normal range of motion.     Cervical back: Normal range of motion and neck supple.  Skin:    General: Skin is warm and dry.  Neurological:     Mental Status: She is alert and oriented to person, place, and time.     ED Results / Procedures / Treatments   Labs (all labs ordered are listed, but only abnormal results are displayed) Labs Reviewed  BASIC METABOLIC PANEL - Abnormal; Notable for the following components:      Result Value   Glucose, Bld 161 (*)    Creatinine, Ser 1.18 (*)    GFR, Estimated 53 (*)    All other components within normal limits  CBC - Abnormal; Notable for the following components:   WBC 15.0 (*)    All other components within normal limits  TROPONIN I (HIGH SENSITIVITY)  TROPONIN I (HIGH SENSITIVITY)    EKG EKG Interpretation  Date/Time:  Friday May 21 2020 12:40:38 EST Ventricular Rate:  96 PR Interval:  122 QRS Duration: 84 QT Interval:  358 QTC Calculation: 452 R Axis:   76 Text Interpretation: Normal sinus rhythm Right atrial enlargement Nonspecific ST abnormality Abnormal ECG Confirmed by Dene Gentry [phone]) on 05/21/2020 1:15:51 PM   Radiology DG Chest 2 View  Result Date: 05/21/2020 CLINICAL DATA:  Chest pain.  History of costochondritis. EXAM: CHEST - 2 VIEW COMPARISON:  08/24/2015 FINDINGS: Heart size is normal. Mild tortuosity of the aorta. Poor inspiratory effort. Allowing for that, the lungs are  clear. No infiltrate, collapse or effusion. No acute bone finding. IMPRESSION: No active disease. Poor inspiratory effort. Electronically Signed   By: Nelson Chimes M.D.   On: 05/21/2020 13:28    Procedures Procedures (including critical care time)  Medications Ordered in ED Medications  morphine 4 MG/ML injection 4 mg (4 mg Intravenous Given 05/21/20 1356)    ED Course  I have reviewed the triage vital signs and the nursing notes.  Pertinent labs & imaging results that were available during my care of the patient were reviewed by me and considered in my medical decision making (see chart for details).    MDM Rules/Calculators/A&P                          MDM  Screen complete  Courtney Myers was evaluated in Emergency Department on 05/21/2020 for the symptoms described in the history of present illness. She was evaluated in the context of the global COVID-19 pandemic, which necessitated consideration that the patient might be at risk for infection with the SARS-CoV-2 virus that causes COVID-19. Institutional protocols and algorithms that pertain to the evaluation of patients at risk for COVID-19 are in a state of rapid change based on information released by regulatory bodies including the CDC and federal and state organizations. These policies and algorithms were followed during the patient's care in the ED.  Patient presents with chest pain. Pain reported is consistent with prior episodes of costochondritis.   Exam suggests reproducible chest pain - consistent with likely costochondritis.  Initial EKG is without acute ischemia.  Troponin  is nearly undetectable.  She feels improved after pain control.  Repeat troponin pending.  Patient likely appropriate for discharge home.   Final Clinical Impression(s) / ED Diagnoses Final diagnoses:  Chest wall pain    Rx / DC Orders ED Discharge Orders         Ordered    HYDROcodone-acetaminophen (NORCO/VICODIN) 5-325 MG  tablet  Every 6 hours PRN        05/21/20 1637           Valarie Merino, MD 05/21/20 [phone]

## 2020-05-21 NOTE — Discharge Instructions (Signed)
Please return for any problem.  °

## 2020-05-22 NOTE — ED Notes (Signed)
Patient discharged from ED yesterday 11/19 at 1530

## 2020-05-24 ENCOUNTER — Encounter: Payer: Self-pay | Admitting: Family Medicine

## 2020-05-25 NOTE — Telephone Encounter (Signed)
Left patient vm to schedule appointment to be seen and discuss lab results with provider and possible referral to cardiology.

## 2020-06-02 ENCOUNTER — Ambulatory Visit: Payer: 59 | Admitting: Neurology

## 2020-06-02 ENCOUNTER — Encounter: Payer: Self-pay | Admitting: Neurology

## 2020-06-02 ENCOUNTER — Other Ambulatory Visit: Payer: Self-pay

## 2020-06-02 VITALS — BP 119/76 | HR 88 | Ht 67.0 in | Wt 177.0 lb

## 2020-06-02 DIAGNOSIS — G243 Spasmodic torticollis: Secondary | ICD-10-CM

## 2020-06-02 NOTE — Progress Notes (Signed)
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  Symptom onset was without apparent triggering event, she has gradually developed this neck pulling,  manifested primarily with right laterocollis, mild retrocolis, and left shoulder elevation since 2000. She has good relief with regular Botox injection, about 4 times a year since 2003. Last injection was February 18, 2010 by Dr. Marta Antu,  She reported great relief as usual, taking 4- 5 days to get symptom relieve, lasting about 3 months.  I began to inject her since 12.2011, every 3-4 months, dosage has been limited to 150 units of BOTOX A.  She denies neck pain, gait difficulty, but with wearing off Botox, she noticed increased head tremor, and pulling towards her right shoulder.  She developed  transient difficulty lifting her neck up when using BOTOX A 200 units previously, reponding well to decreased dose of 150 units  She was enrolled into IPSEN dysport study, first injection was in April 30th 2013, she later enrolled into open label, 2nd injection was in  May 28th 2013. She did very well.  Recent few weeks,  She had experienced flareup of her rheumatoid arthritis, has received IV infusion. Last injection was in 06/10/2012,  500 units of dysport was dissolved into 2 cc of normal saline. Right longissimus capitus 0.5 cc Right splenius capitis 0.5 cc Right levator scapular 0.5 cc Right iliocostalis 0.5 cc   Dysport research study has completed, she is now coming back for repeat injection, she noticed head bobbing over the past few weeks, she denies significant neck pain, weakness,  Her rheumatoid arthritis is under better control   Last injection was in Sep 2014, she did very well, no neck extension weakness, only rarely neck shaking  UPDATE March 8th 2016: She has lost follow-up since her last  visit December 2014, She came in for treatment her cervical dystonia, she feel the tension of her neck all the time, stiffness. Denied gait difficulty, last injection was September 2014, she received 500 units of Dysport, works well for her, she is continue on medication for anxiety, rheumatoid arthritis She also complains of nighttime snoring, frequent awakening, excessive daytime sleepiness, fatigue, today's ESS is 6, FSS was 19,  UPDATE October 13 2014:   She has missed her scheduled sleep study, continue have worsening neck posturing, posterior neck pain, excessive daytime fatigue and sleepiness  UPDATE Nov 11 2014: She came in for EMG guided Dysport injection today, last injection was December 2014, she complains of worsening head titubation, bilateral hands mild postural tremor, posterior neck pain,  Update February 17 2015 She responded very well to last EMG guided Dysport injection Nov 11 2014, no significant head titubation, no significant neck pain  Update July 22 2015: Last injection was August, now she noticed returning of neck pulling and shaking, posterior neck muscle achy pain  Update October 21 2015: Last EMG guided Dysport injection was in January, she responded very well, only recent couple weeks, she noticed returning of neck pulling, shaking again, She has worsening rheumatoid arthritis, is receiving more frequent treatment, complains fatigue, diffuse body achy pain  Update March 15 2016: She responded very well to previous Dysport injection in April, no significant side effect noticed, only recent few weeks, 5 months from previous injection, she noticed return of head shaking neck muscle tightness.  UPDATE Dec 14th 2017: She responded very well  to previous injection in Sept 2017, no significant side effect noticed.  Update September 13 2016: She did well this last injection in December  Update January 15 2017: She noticed worsening head tremor, she had excessive stress,  her sister passed away from lung cancer in April 2018,  UPDATE Apr 25 2017: She responded very well with previous injection, barely has noticeable head shaking.  Update August 01, 2017: She responded very well to previous dysport injection, only recently began to have recurrent head shaking  UPDATE December 19 2017: She responded well to previous injection  UPDATE Sept 25 2019: She responded very well to previous injection  REVIEW OF SYSTEMS: Full 14 system review of systems performed and notable only for ear pain, joint pain, joint swelling, achy muscles, muscle cramps, rash, tremors, depression, anxiety  ALLERGIES: No Known Allergies  HOME MEDICATIONS: Current Outpatient Medications  Medication Sig Dispense Refill  . baclofen (LIORESAL) 10 MG tablet Take 10 mg by mouth daily as needed for muscle spasms.     . clonazePAM (KLONOPIN) 0.5 MG tablet Take 1 tablet (0.5 mg total) by mouth 2 (two) times daily as needed. for anxiety (Patient taking differently: Take 0.5 mg by mouth 2 (two) times daily as needed for anxiety. ) 60 tablet 5  . CRANBERRY PO Take 2 capsules by mouth in the morning and at bedtime.     . DULoxetine (CYMBALTA) 60 MG capsule TAKE 1 CAPSULE(60 MG) BY MOUTH DAILY (Patient taking differently: Take 60 mg by mouth daily. ) 30 capsule 2  . DYSPORT 500 units SOLR injection INJECT 500 UNITS  INTRAMUSCULARLY EVERY 3  MONTHS (GIVEN AT MD OFFICE, DISCARD UNUSED) (Patient taking differently: Inject 500 Units into the muscle every 3 (three) months. Given at md office) 1 each 3  . folic acid (FOLVITE) 1 MG tablet TAKE 1 TABLET(1 MG) BY MOUTH DAILY (Patient taking differently: Take 1 mg by mouth at bedtime. ) 90 tablet 4  . HYDROcodone-acetaminophen (NORCO/VICODIN) 5-325 MG tablet Take 1 tablet by mouth every 6 (six) hours as needed. 10 tablet 0  . methotrexate 50 MG/2ML injection INJECT 0.4 ML INTO THE SKIN ONCE A WEEK. (Patient taking differently: Inject 10 mg into the muscle every  Tuesday. ) 6 mL 0  . Multiple Vitamins-Minerals (MULTIVITAMINS THER. W/MINERALS) TABS Take 1 tablet by mouth at bedtime.     Marland Kitchen omeprazole (PRILOSEC) 20 MG capsule TAKE 1 CAPSULE BY MOUTH  EVERY DAY (Patient taking differently: Take 20 mg by mouth daily. ) 90 capsule 3  . traMADol (ULTRAM) 50 MG tablet Take 1 - 2 every 8 hours as needed for pain. (Patient taking differently: Take 50-100 mg by mouth every 8 (eight) hours as needed (pain). ) 30 tablet 0  . TUBERCULIN SYR 1CC/27GX1/2" (B-D TB SYRINGE 1CC/27GX1/2") 27G X 1/2" 1 ML MISC USE ONE SYRINGE ONCE WEEKLY AS DIRECTED 4 each 11  . Upadacitinib ER (RINVOQ) 15 MG TB24 Take 15 mg by mouth daily. (Patient taking differently: Take 15 mg by mouth at bedtime. ) 90 tablet 0  . Xylitol (XYLIMELTS MT) Use as directed 2 tablets in the mouth or throat at bedtime as needed (dry mouth).      No current facility-administered medications for this visit.    PAST MEDICAL HISTORY: Past Medical History:  Diagnosis Date  . Anxiety   . Cervical dystonia   . Depression   . Fibromyalgia   . GERD (gastroesophageal reflux disease)   . History of shingles   .  RA (rheumatoid arthritis) ([place])   . Synovitis of hand     PAST SURGICAL HISTORY: Past Surgical History:  Procedure Laterality Date  . TONSILLECTOMY    . VAGINAL HYSTERECTOMY  3/08    FAMILY HISTORY: Family History  Problem Relation Age of Onset  . Arthritis Mother   . Heart Problems Father   . Heart disease Father   . Heart disease Paternal Uncle        MI    SOCIAL HISTORY:  Social History   Socioeconomic History  . Marital status: Married    Spouse name: [name]  . Number of children: 2  . Years of education: 30  . Highest education level: Not on file  Occupational History    Employer: [employer]  Tobacco Use  . Smoking status: Current Every Day Smoker    Packs/day: 1.00    Years: 34.00    Pack years: 34.00    Types: Cigarettes    Start date: 25  . Smokeless tobacco:  Never Used  Vaping Use  . Vaping Use: Never used  Substance and Sexual Activity  . Alcohol use: Yes    Alcohol/week: 0.0 standard drinks    Comment: Rare, twice per year  . Drug use: Never  . Sexual activity: Yes    Birth control/protection: Surgical    Comment: 1st intercourse 59 yo-Fewer than 5 partners  Other Topics Concern  . 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 Right handed.    Caffeine- 4 cups daily.   Social Determinants of Health   Financial Resource Strain:   . Difficulty of Paying Living Expenses: Not on file  Food Insecurity:   . Worried About Charity fundraiser in the Last Year: Not on file  . Ran Out of Food in the Last Year: Not on file  Transportation Needs:   . Lack of Transportation (Medical): Not on file  . Lack of Transportation (Non-Medical): Not on file  Physical Activity:   . Days of Exercise per Week: Not on file  . Minutes of Exercise per Session: Not on file  Stress:   . Feeling of Stress : Not on file  Social Connections:   . Frequency of Communication with Friends and Family: Not on file  . Frequency of Social Gatherings with Friends and Family: Not on file  . Attends Religious Services: Not on file  . Active Member of Clubs or Organizations: Not on file  . Attends Archivist Meetings: Not on file  . Marital Status: Not on file  Intimate Partner Violence:   . Fear of Current or Ex-Partner: Not on file  . Emotionally Abused: Not on file  . Physically Abused: Not on file  . Sexually Abused: Not on file     PHYSICAL EXAM   Vitals:   06/02/20 1510  BP: 119/76  Pulse: 88  Weight: 177 lb (80.3 kg)  Height: 5\' 7"  (1.702 m)   Not recorded     Body mass index is 27.72 kg/m.  PHYSICAL EXAMNIATION: MENTAL STATUS: Speech is normal, normal to casual conversation, and history taking  CRANIAL NERVES: mild  retrocollis, right tilt, mild right lateral shift, slight left shoulder elevation  MOTOR: Normal tone and bulk and strength COORDINATION: No dysmetria   GAIT/STANCE: Posture is normal.  DIAGNOSTIC DATA (LABS, IMAGING, TESTING) - I reviewed patient records, labs, notes, testing and  imaging myself where available.  ASSESSMENT AND PLAN  Courtney Myers is a 59 y.o. female with long-standing history of cervical dystonia, responded very well to previous EMG guided Dysport injection, last injection was May 2016, responded very well.   Mild retrocollis, right tilt, mild right lateral shift, slight left shoulder elevation  500 of Dysport was dissolved into 2.5 cc of normal saline,  Right longissimus capitus 0. 5 cc Left splenius capitis 0.5 cc Right inferior oblique capitis, 0.5 cc  Left inferior oblique capitis 0.5 cc Left levator scapular 0.5   Patient tolerate the injection well, will return to clinic in 3 months for repeat injection Please dissolve 500 units of Dysport into 2.5 cc of normal saline   Courtney Myers, M.D. Ph.D.  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  Symptom onset was without apparent triggering event, she has gradually developed this neck pulling,  manifested primarily with right laterocollis, mild retrocolis, and left shoulder elevation since 2000. She has good relief with regular Botox injection, about 4 times a year since 2003. Last injection was February 18, 2010 by Dr. Marta Antu,  She reported great relief as usual, taking 4- 5 days to get symptom relieve, lasting about 3 months.  I began to inject her since 12.2011, every 3-4 months, dosage has been limited to 150 units of BOTOX  A.  She denies neck pain, gait difficulty, but with wearing off Botox, she noticed increased head tremor, and pulling towards her right shoulder.  She developed  transient difficulty lifting her neck up when using BOTOX A 200 units previously, reponding well to decreased dose of 150 units  She was enrolled into IPSEN dysport study, first injection was in April 30th 2013, she later enrolled into open label, 2nd injection was in  May 28th 2013. She did very well.  Recent few weeks,  She had experienced flareup of her rheumatoid arthritis, has received IV infusion. Last injection was in 06/10/2012,  500 units of dysport was dissolved into 2 cc of normal saline. Right longissimus capitus 0.5 cc Right splenius capitis 0.5 cc Right levator scapular 0.5 cc Right iliocostalis 0.5 cc   Dysport research study has completed, she is now coming back for repeat injection, she noticed head bobbing over the past few weeks, she denies significant neck pain, weakness,  Her rheumatoid arthritis is under better control   Last injection was in Sep 2014, she did very well, no neck extension weakness, only rarely neck shaking  UPDATE March 8th 2016: She has lost follow-up since her last visit December 2014, She came in for treatment her cervical dystonia, she feel the tension of her neck all the time, stiffness. Denied gait difficulty, last injection was September 2014, she received 500 units of Dysport, works well for her, she is continue on medication for anxiety, rheumatoid arthritis She also complains of nighttime snoring, frequent awakening, excessive daytime sleepiness, fatigue, today's ESS is 6, FSS was 59,  UPDATE October 13 2014:   She has missed her scheduled sleep study, continue have worsening neck posturing, posterior neck pain, excessive daytime fatigue and sleepiness  UPDATE Nov 11 2014: She came in for EMG guided Dysport injection today, last injection was December 2014, she complains of worsening head  titubation, bilateral hands mild postural tremor, posterior neck pain,  Update February 17 2015 She responded very well to last EMG guided Dysport injection Nov 11 2014, no significant head titubation, no significant neck pain  Update July 22 2015: Last injection was August, now she noticed returning of neck pulling and shaking, posterior neck muscle achy pain  Update October 21 2015: Last EMG guided Dysport injection was in January, she responded very well, only recent couple weeks, she noticed returning of neck pulling, shaking again, She has worsening rheumatoid arthritis, is receiving more frequent treatment, complains fatigue, diffuse body achy pain  Update March 15 2016: She responded very well to previous Dysport injection in April, no significant side effect noticed, only recent few weeks, 5 months from previous injection, she noticed return of head shaking neck muscle tightness.  UPDATE Dec 14th 2017: She responded very well to previous injection in Sept 2017, no significant side effect noticed.  Update September 13 2016: She did well this last injection in December  Update January 15 2017: She noticed worsening head tremor, she had excessive stress, her sister passed away from lung cancer in April 2018,  UPDATE Apr 25 2017: She responded very well with previous injection, barely has noticeable head shaking.  Update August 01, 2017: She responded very well to previous dysport injection, only recently began to have recurrent head shaking  UPDATE December 19 2017: She responded well to previous injection  UPDATE Jun 20 2018: She did very well with previous injection  UPDATE October 01 2018: She did well to previous injection  UPDATE January 01 2019: She did well with previous injection  UPDATE Oct 28th 2020: She did well to previous injections  UPDATE Jan 27th 2021: She did well to previous injections  Update October 29, 2019: She did well with previous injection, no  significant side effect noted.  Update February 04, 2020, Uses Dysport 500 units, responding well  UPDATE Jun 02 2020: Uses Dysport 500 units, responding well  REVIEW OF SYSTEMS: Full 14 system review of systems performed and notable only for as above.  All rest review of system were negative.  ALLERGIES: No Known Allergies  HOME MEDICATIONS: Current Outpatient Medications  Medication Sig Dispense Refill  . baclofen (LIORESAL) 10 MG tablet Take 10 mg by mouth daily as needed for muscle spasms.     . clonazePAM (KLONOPIN) 0.5 MG tablet Take 1 tablet (0.5 mg total) by mouth 2 (two) times daily as needed. for anxiety (Patient taking differently: Take 0.5 mg by mouth 2 (two) times daily as needed for anxiety. ) 60 tablet 5  . CRANBERRY PO Take 2 capsules by mouth in the morning and at bedtime.     . DULoxetine (CYMBALTA) 60 MG capsule TAKE 1 CAPSULE(60 MG) BY MOUTH DAILY (Patient taking differently: Take 60 mg by mouth daily. ) 30 capsule 2  . DYSPORT 500 units SOLR injection INJECT 500 UNITS  INTRAMUSCULARLY EVERY 3  MONTHS (GIVEN AT MD OFFICE, DISCARD UNUSED) (Patient taking differently: Inject 500 Units into the muscle every 3 (three) months. Given at md office) 1 each 3  . folic acid (FOLVITE) 1 MG tablet TAKE 1 TABLET(1 MG) BY MOUTH DAILY (Patient taking differently: Take 1 mg by mouth at bedtime. ) 90 tablet 4  . HYDROcodone-acetaminophen (NORCO/VICODIN) 5-325 MG tablet Take 1 tablet by mouth every 6 (six) hours as needed. 10 tablet 0  . methotrexate 50 MG/2ML injection INJECT 0.4 ML INTO THE SKIN ONCE A WEEK. (Patient  taking differently: Inject 10 mg into the muscle every Tuesday. ) 6 mL 0  . Multiple Vitamins-Minerals (MULTIVITAMINS THER. W/MINERALS) TABS Take 1 tablet by mouth at bedtime.     Marland Kitchen omeprazole (PRILOSEC) 20 MG capsule TAKE 1 CAPSULE BY MOUTH  EVERY DAY (Patient taking differently: Take 20 mg by mouth daily. ) 90 capsule 3  . traMADol (ULTRAM) 50 MG tablet Take 1 - 2 every 8  hours as needed for pain. (Patient taking differently: Take 50-100 mg by mouth every 8 (eight) hours as needed (pain). ) 30 tablet 0  . TUBERCULIN SYR 1CC/27GX1/2" (B-D TB SYRINGE 1CC/27GX1/2") 27G X 1/2" 1 ML MISC USE ONE SYRINGE ONCE WEEKLY AS DIRECTED 4 each 11  . Upadacitinib ER (RINVOQ) 15 MG TB24 Take 15 mg by mouth daily. (Patient taking differently: Take 15 mg by mouth at bedtime. ) 90 tablet 0  . Xylitol (XYLIMELTS MT) Use as directed 2 tablets in the mouth or throat at bedtime as needed (dry mouth).      No current facility-administered medications for this visit.    PAST MEDICAL HISTORY: Past Medical History:  Diagnosis Date  . Anxiety   . Cervical dystonia   . Depression   . Fibromyalgia   . GERD (gastroesophageal reflux disease)   . History of shingles   . RA (rheumatoid arthritis) ([place])   . Synovitis of hand     PAST SURGICAL HISTORY: Past Surgical History:  Procedure Laterality Date  . TONSILLECTOMY    . VAGINAL HYSTERECTOMY  3/08    FAMILY HISTORY: Family History  Problem Relation Age of Onset  . Arthritis Mother   . Heart Problems Father   . Heart disease Father   . Heart disease Paternal Uncle        MI    SOCIAL HISTORY:  Social History   Socioeconomic History  . Marital status: Married    Spouse name: [name]  . Number of children: 2  . Years of education: 57  . Highest education level: Not on file  Occupational History    Employer: [employer]  Tobacco Use  . Smoking status: Current Every Day Smoker    Packs/day: 1.00    Years: 34.00    Pack years: 34.00    Types: Cigarettes    Start date: 26  . Smokeless tobacco: Never Used  Vaping Use  . Vaping Use: Never used  Substance and Sexual Activity  . Alcohol use: Yes    Alcohol/week: 0.0 standard drinks    Comment: Rare, twice per year  . Drug use: Never  . Sexual activity: Yes    Birth control/protection: Surgical    Comment: 1st intercourse 59 yo-Fewer than 5 partners  Other  Topics Concern  . 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 Right handed.    Caffeine- 4 cups daily.   Social Determinants of Health   Financial Resource Strain:   . Difficulty of Paying Living Expenses: Not on file  Food Insecurity:   . Worried About Charity fundraiser in the Last Year: Not on file  . Ran Out of Food in the Last Year: Not on file  Transportation Needs:   . Lack of Transportation (Medical): Not on file  . Lack of Transportation (Non-Medical): Not on file  Physical Activity:   . Days of Exercise per Week: Not on file  . Minutes  of Exercise per Session: Not on file  Stress:   . Feeling of Stress : Not on file  Social Connections:   . Frequency of Communication with Friends and Family: Not on file  . Frequency of Social Gatherings with Friends and Family: Not on file  . Attends Religious Services: Not on file  . Active Member of Clubs or Organizations: Not on file  . Attends Archivist Meetings: Not on file  . Marital Status: Not on file  Intimate Partner Violence:   . Fear of Current or Ex-Partner: Not on file  . Emotionally Abused: Not on file  . Physically Abused: Not on file  . Sexually Abused: Not on file     PHYSICAL EXAM   Vitals:   06/02/20 1510  BP: 119/76  Pulse: 88  Weight: 177 lb (80.3 kg)  Height: 5\' 7"  (1.702 m)   Not recorded     Body mass index is 27.72 kg/m.  PHYSICAL EXAMNIATION:   Mild retrocollis, right tilt, mild right lateral shift, she has moderate tenderness at left levator scapular muscle   ASSESSMENT AND PLAN  Courtney Myers is a 59 y.o. female with long-standing history of cervical dystonia, responded very well to previous EMG guided Dysport injection, l  Mild retrocollis, right tilt, mild right lateral shift, slight left shoulder elevation  500 of Dysport was dissolved into  2.5 cc of normal saline,  Right longissimus capitus 0. 5cc Right splenius capitis 0.25 c Right inferior oblique capitis 0.5 cc Left inferior oblique capitis 0. 5 cc Right splenius cervix 0.5 cc Left levator scapular 0. 25 cc   Patient tolerate the injection well, will return to clinic in 3 months for repeat injection Please dissolve 500 units of Dysport into 2.5 cc of normal saline   Courtney Myers, M.D. Ph.D.  [facility] [address] [place], [place] [address] Ph: [phone] Fax: [phone]

## 2020-06-02 NOTE — Progress Notes (Signed)
**  Dysport 500 units x 1 vial, NDC [address]-2, Lot [id number], Exp 12/30/2020, specialty pharmacy.//mck,rn**

## 2020-06-07 ENCOUNTER — Other Ambulatory Visit: Payer: Self-pay

## 2020-06-07 ENCOUNTER — Other Ambulatory Visit: Payer: 59

## 2020-06-07 DIAGNOSIS — Z20822 Contact with and (suspected) exposure to covid-19: Secondary | ICD-10-CM

## 2020-06-09 LAB — SARS-COV-2, NAA 2 DAY TAT

## 2020-06-09 LAB — NOVEL CORONAVIRUS, NAA: SARS-CoV-2, NAA: NOT DETECTED

## 2020-06-14 ENCOUNTER — Ambulatory Visit (INDEPENDENT_AMBULATORY_CARE_PROVIDER_SITE_OTHER): Payer: 59 | Admitting: Family Medicine

## 2020-06-14 ENCOUNTER — Other Ambulatory Visit: Payer: Self-pay

## 2020-06-14 VITALS — BP 130/90 | HR 89 | Temp 97.8°F | Ht 67.0 in | Wt 178.0 lb

## 2020-06-14 DIAGNOSIS — M069 Rheumatoid arthritis, unspecified: Secondary | ICD-10-CM

## 2020-06-14 DIAGNOSIS — R0602 Shortness of breath: Secondary | ICD-10-CM | POA: Diagnosis not present

## 2020-06-14 DIAGNOSIS — F17200 Nicotine dependence, unspecified, uncomplicated: Secondary | ICD-10-CM

## 2020-06-14 DIAGNOSIS — Z8249 Family history of ischemic heart disease and other diseases of the circulatory system: Secondary | ICD-10-CM | POA: Diagnosis not present

## 2020-06-14 NOTE — Progress Notes (Signed)
Subjective:    Patient ID: Courtney Myers, female    DOB: 1961/03/07, 59 y.o.   MRN: [id number]  HPI Patient is a very pleasant 59 year old Caucasian female who presents today for follow-up of recent ER visit.  She was seen in the emergency room for chest pain.  The pain began suddenly.  It was substernal in location.  It was intense.  She describes it as a 10 out of 10.  However she was having pleurisy associated with it.  The pain was reproducible with palpation on the chest wall.  She believed it was due to costochondritis and it responded dramatically to morphine.  EKG, chest x-ray, troponins x2 were negative in the emergency room.  The only concerning finding was an elevated white blood cell count of 15.  However patient is concerned about her long-term risk for cardiac disease.  She certainly has risk factors.  The patient smokes.  She has a history of coronary artery disease.  Her father had numerous stents and bypasses starting in his 28s.  She had a paternal uncle who had stents in his 24s.  Her paternal grandfather also had stents and bypasses.  Therefore coronary artery disease is strong and the female members on her father side of the family usually in the late 24s or early 29s.  The patient also has an inflammatory condition with rheumatoid arthritis that would put her at increased cardiac risk.  I do not believe that the chest pain she was experiencing in November was for heart however based on these risk factors I do believe she is at high risk moving forward.  She is not had a cholesterol in some time.  She is not on a statin.  Past Medical History:  Diagnosis Date  . Anxiety   . Cervical dystonia   . Depression   . Fibromyalgia   . GERD (gastroesophageal reflux disease)   . History of shingles   . RA (rheumatoid arthritis) ([place])   . Synovitis of hand    Past Surgical History:  Procedure Laterality Date  . TONSILLECTOMY    . VAGINAL HYSTERECTOMY  3/08   Current  Outpatient Medications on File Prior to Visit  Medication Sig Dispense Refill  . baclofen (LIORESAL) 10 MG tablet Take 10 mg by mouth daily as needed for muscle spasms.     . clonazePAM (KLONOPIN) 0.5 MG tablet Take 1 tablet (0.5 mg total) by mouth 2 (two) times daily as needed. for anxiety (Patient taking differently: Take 0.5 mg by mouth 2 (two) times daily as needed for anxiety. ) 60 tablet 5  . CRANBERRY PO Take 2 capsules by mouth in the morning and at bedtime.     . DULoxetine (CYMBALTA) 60 MG capsule TAKE 1 CAPSULE(60 MG) BY MOUTH DAILY (Patient taking differently: Take 60 mg by mouth daily. ) 30 capsule 2  . DYSPORT 500 units SOLR injection INJECT 500 UNITS  INTRAMUSCULARLY EVERY 3  MONTHS (GIVEN AT MD OFFICE, DISCARD UNUSED) (Patient taking differently: Inject 500 Units into the muscle every 3 (three) months. Given at md office) 1 each 3  . folic acid (FOLVITE) 1 MG tablet TAKE 1 TABLET(1 MG) BY MOUTH DAILY (Patient taking differently: Take 1 mg by mouth at bedtime. ) 90 tablet 4  . HYDROcodone-acetaminophen (NORCO/VICODIN) 5-325 MG tablet Take 1 tablet by mouth every 6 (six) hours as needed. 10 tablet 0  . methotrexate 50 MG/2ML injection INJECT 0.4 ML INTO THE SKIN ONCE A WEEK. (Patient taking  differently: Inject 10 mg into the muscle every Tuesday. ) 6 mL 0  . Multiple Vitamins-Minerals (MULTIVITAMINS THER. W/MINERALS) TABS Take 1 tablet by mouth at bedtime.     Marland Kitchen omeprazole (PRILOSEC) 20 MG capsule TAKE 1 CAPSULE BY MOUTH  EVERY DAY (Patient taking differently: Take 20 mg by mouth daily. ) 90 capsule 3  . traMADol (ULTRAM) 50 MG tablet Take 1 - 2 every 8 hours as needed for pain. (Patient taking differently: Take 50-100 mg by mouth every 8 (eight) hours as needed (pain). ) 30 tablet 0  . TUBERCULIN SYR 1CC/27GX1/2" (B-D TB SYRINGE 1CC/27GX1/2") 27G X 1/2" 1 ML MISC USE ONE SYRINGE ONCE WEEKLY AS DIRECTED 4 each 11  . Upadacitinib ER (RINVOQ) 15 MG TB24 Take 15 mg by mouth daily. (Patient  taking differently: Take 15 mg by mouth at bedtime. ) 90 tablet 0  . Xylitol (XYLIMELTS MT) Use as directed 2 tablets in the mouth or throat at bedtime as needed (dry mouth).      No current facility-administered medications on file prior to visit.   No Known Allergies Social History   Socioeconomic History  . Marital status: Married    Spouse name: [name]  . Number of children: 2  . Years of education: 33  . Highest education level: Not on file  Occupational History    Employer: [employer]  Tobacco Use  . Smoking status: Current Every Day Smoker    Packs/day: 1.00    Years: 34.00    Pack years: 34.00    Types: Cigarettes    Start date: 38  . Smokeless tobacco: Never Used  Vaping Use  . Vaping Use: Never used  Substance and Sexual Activity  . Alcohol use: Yes    Alcohol/week: 0.0 standard drinks    Comment: Rare, twice per year  . Drug use: Never  . Sexual activity: Yes    Birth control/protection: Surgical    Comment: 1st intercourse 59 yo-Fewer than 5 partners  Other Topics Concern  . 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 Right handed.    Caffeine- 4 cups daily.   Social Determinants of Health   Financial Resource Strain: Not on file  Food Insecurity: Not on file  Transportation Needs: Not on file  Physical Activity: Not on file  Stress: Not on file  Social Connections: Not on file  Intimate Partner Violence: Not on file     Review of Systems  All other systems reviewed and are negative.      Objective:   Physical Exam Vitals reviewed.  Constitutional:      Appearance: Normal appearance.  Cardiovascular:     Rate and Rhythm: Normal rate and regular rhythm.  Pulmonary:     Effort: Pulmonary effort is normal.     Breath sounds: Normal breath sounds.  Neurological:     Mental Status: She is alert.            Assessment & Plan:  Shortness of breath - Plan: CBC with Differential/Platelet, D-dimer, quantitative (not at [facility]), Lipid panel  Family history of early CAD  Smoker  Rheumatoid arthritis involving multiple sites, unspecified whether rheumatoid factor present ([place])  Obviously smoking cessation would benefit the patient regardless and we discussed this today.  However, I would like to set her up to see a cardiologist for risk stratification with a coronary artery CT  scan/calcium score.  If elevated, the patient would benefit from a high-dose statin.  Risk factors include age, family history of coronary artery disease, a pro inflammatory condition such as rheumatoid arthritis, and age greater than 63.  Therefore I will schedule the patient to see cardiology.  Also given the severity of the pain and the elevated white blood cell count I would like to check a D-dimer as the patient is still having some shortness of breath with activity and even while talking.  If positive, the patient may require a CT scan to rule out PE.

## 2020-06-15 ENCOUNTER — Other Ambulatory Visit: Payer: Self-pay | Admitting: Family Medicine

## 2020-06-15 DIAGNOSIS — R0781 Pleurodynia: Secondary | ICD-10-CM

## 2020-06-15 DIAGNOSIS — R7989 Other specified abnormal findings of blood chemistry: Secondary | ICD-10-CM

## 2020-06-15 LAB — CBC WITH DIFFERENTIAL/PLATELET
Absolute Monocytes: 686 cells/uL (ref 200–950)
Basophils Absolute: 66 cells/uL (ref 0–200)
Basophils Relative: 0.7 %
Eosinophils Absolute: 113 cells/uL (ref 15–500)
Eosinophils Relative: 1.2 %
HCT: 36.2 % (ref 35.0–45.0)
Hemoglobin: 12.4 g/dL (ref 11.7–15.5)
Lymphs Abs: 2905 cells/uL (ref 850–3900)
MCH: 30.6 pg (ref 27.0–33.0)
MCHC: 34.3 g/dL (ref 32.0–36.0)
MCV: 89.4 fL (ref 80.0–100.0)
MPV: 11.6 fL (ref 7.5–12.5)
Monocytes Relative: 7.3 %
Neutro Abs: 5631 cells/uL (ref 1500–7800)
Neutrophils Relative %: 59.9 %
Platelets: 260 10*3/uL (ref 140–400)
RBC: 4.05 10*6/uL (ref 3.80–5.10)
RDW: 12.7 % (ref 11.0–15.0)
Total Lymphocyte: 30.9 %
WBC: 9.4 10*3/uL (ref 3.8–10.8)

## 2020-06-15 LAB — LIPID PANEL
Cholesterol: 237 mg/dL — ABNORMAL HIGH (ref <200)
HDL: 52 mg/dL (ref >=50)
LDL Cholesterol (Calc): 159 mg/dL (calc) — ABNORMAL HIGH
Non-HDL Cholesterol (Calc): 185 mg/dL (calc) — ABNORMAL HIGH (ref <130)
Total CHOL/HDL Ratio: 4.6 (calc) (ref <5.0)
Triglycerides: 131 mg/dL (ref <150)

## 2020-06-15 LAB — D-DIMER, QUANTITATIVE: D-Dimer, Quant: 2.68 mcg/mL FEU — ABNORMAL HIGH (ref <0.50)

## 2020-06-16 ENCOUNTER — Ambulatory Visit: Payer: 59 | Admitting: Cardiology

## 2020-06-16 ENCOUNTER — Encounter: Payer: Self-pay | Admitting: Cardiology

## 2020-06-16 ENCOUNTER — Ambulatory Visit
Admission: RE | Admit: 2020-06-16 | Discharge: 2020-06-16 | Disposition: A | Discharge status: Home / Self Care | Payer: 59 | Source: Ambulatory Visit | Attending: Family Medicine | Admitting: Family Medicine

## 2020-06-16 ENCOUNTER — Other Ambulatory Visit: Payer: Self-pay

## 2020-06-16 VITALS — BP 118/80 | HR 89 | Ht 66.0 in | Wt 175.2 lb

## 2020-06-16 DIAGNOSIS — R079 Chest pain, unspecified: Secondary | ICD-10-CM | POA: Diagnosis not present

## 2020-06-16 DIAGNOSIS — Z7189 Other specified counseling: Secondary | ICD-10-CM | POA: Diagnosis not present

## 2020-06-16 DIAGNOSIS — Z716 Tobacco abuse counseling: Secondary | ICD-10-CM

## 2020-06-16 DIAGNOSIS — Z8249 Family history of ischemic heart disease and other diseases of the circulatory system: Secondary | ICD-10-CM

## 2020-06-16 DIAGNOSIS — R7989 Other specified abnormal findings of blood chemistry: Secondary | ICD-10-CM

## 2020-06-16 DIAGNOSIS — I700 Atherosclerosis of aorta: Secondary | ICD-10-CM

## 2020-06-16 DIAGNOSIS — I2510 Atherosclerotic heart disease of native coronary artery without angina pectoris: Secondary | ICD-10-CM | POA: Diagnosis not present

## 2020-06-16 DIAGNOSIS — Z713 Dietary counseling and surveillance: Secondary | ICD-10-CM

## 2020-06-16 DIAGNOSIS — Z79899 Other long term (current) drug therapy: Secondary | ICD-10-CM

## 2020-06-16 DIAGNOSIS — Z7182 Exercise counseling: Secondary | ICD-10-CM

## 2020-06-16 DIAGNOSIS — R0781 Pleurodynia: Secondary | ICD-10-CM

## 2020-06-16 MED ORDER — ROSUVASTATIN CALCIUM 20 MG PO TABS: 20.0000 mg | ORAL_TABLET | Freq: Every day | ORAL | 3 refills | Status: DC

## 2020-06-16 MED ORDER — IOPAMIDOL (ISOVUE-370) INJECTION 76%
75.0000 mL | Freq: Once | INTRAVENOUS | Status: AC | PRN
  Administered 2020-06-16: 75 mL via INTRAVENOUS

## 2020-06-16 MED ORDER — ASPIRIN EC 81 MG PO TBEC: 81.0000 mg | DELAYED_RELEASE_TABLET | Freq: Every day | ORAL | 3 refills | Status: AC

## 2020-06-16 NOTE — Progress Notes (Signed)
Cardiology Office Note:    Date:  06/16/2020   ID:  Courtney, Myers 01-21-1961, MRN [id number]  PCP:  Courtney Frizzle, MD  Cardiologist:  Buford Dresser, MD  Referring MD: Courtney Frizzle, MD   CC: new patient consultation for cardiovascular risk  History of Present Illness:    Courtney Myers is a 59 y.o. female with a hx of rheumatoid arthritis, chest pain who is seen as a new consult at the request of Courtney Frizzle, MD for the evaluation and management of cardiovascular risk.  Recent note from Dr. Dennard Myers dated 06/14/20 reviewed. Noted prior ER visit for chest pain, 10/10, pleuritic, with chest wall tenderness. Responded well to morphine. ECG, CXR, hsTni x2 unremarkable. Concerned about long term risk of heart disease, referred for further evaluation.  Per notes, is a current smoker, has a history of CAD noted (she denies), father has many stents/bypasses beginning in his 53s. Paternal uncle with stents in his 30s. She has not been on a statin.  She had a d-dimer checked at the visit, which was elevated. She had CTPE today, which was negative for PE.  Her recent episode of costochondritis was the "worst pain ever." Wants to know how to treat this in the future. Also asking about RA meds, d-dimer.  Cardiovascular risk factors: Prior clinical ASCVD: none that she knows of. No testing. Comorbid conditions: Denies hypertension, hyperlipidemia, diabetes, chronic kidney disease Metabolic syndrome/Obesity: highest adult weight is current weight. Chronic inflammatory conditions: rheumatoid arthritis, on recon and methotrexate. Feels that rinvoq isn't helping. Has been on methotrexate about 8 years.  Tobacco use history: current, 1 ppd. Has quit in the past, was tobacco abstinent for 6 years.  Family history: on father's side, grandfather, father and uncle all had significant CAD requiring stents/bypasses. None on her mother's side. Prior cardiac testing  and/or incidental findings on other testing (ie coronary calcium): CT noncon 12/29/19 noted mild aortic atherosclerosis and LAD coronary calcium. Denies any prior stress test or cath Exercise level: largely sedentary. Does take stairs, can do a flight without limitations. No intentional exercise. Current diet: Tries to choose healthy foods. Avoids red meat because it triggers her RA. Eats a lot of chicken and fish. Eats at [employer] frequently.   Chest pain: won't have an issue for 6 mos, then will have severe episode. Most recent episode was at work, came out of nowhere. Only thing she could think of was that she lifted a water bottle for a water cooler prior. Feels like a pressure/heaviness in her chest, heard to breathe, better when she stretches her rib cage. Better with ice, rest. Better with pain medication (significant), belching (slightly). Worse with change in position or pressure over the spot. Hard to describe if sharp/dull/burning/etc. Can be in any part of her chest.  Past Medical History:  Diagnosis Date   Anxiety    Cervical dystonia    Depression    Fibromyalgia    GERD (gastroesophageal reflux disease)    History of shingles    RA (rheumatoid arthritis) (HCC)    Synovitis of hand     Past Surgical History:  Procedure Laterality Date   TONSILLECTOMY     VAGINAL HYSTERECTOMY  3/08    Current Medications: Current Outpatient Medications on File Prior to Visit  Medication Sig   baclofen (LIORESAL) 10 MG tablet Take 10 mg by mouth daily as needed for muscle spasms.    clonazePAM (KLONOPIN) 0.5 MG tablet Take 1 tablet (0.5  mg total) by mouth 2 (two) times daily as needed. for anxiety (Patient taking differently: Take 0.5 mg by mouth 2 (two) times daily as needed for anxiety.)   CRANBERRY PO Take 2 capsules by mouth in the morning and at bedtime.    DULoxetine (CYMBALTA) 60 MG capsule TAKE 1 CAPSULE(60 MG) BY MOUTH DAILY   DYSPORT 500 units SOLR injection INJECT 500 UNITS   INTRAMUSCULARLY EVERY 3  MONTHS (GIVEN AT MD OFFICE, DISCARD UNUSED) (Patient taking differently: Inject 500 Units into the muscle every 3 (three) months. Given at md office)   folic acid (FOLVITE) 1 MG tablet TAKE 1 TABLET(1 MG) BY MOUTH DAILY (Patient taking differently: Take 1 mg by mouth at bedtime.)   HYDROcodone-acetaminophen (NORCO/VICODIN) 5-325 MG tablet Take 1 tablet by mouth every 6 (six) hours as needed.   methotrexate 50 MG/2ML injection INJECT 0.4 ML INTO THE SKIN ONCE A WEEK. (Patient taking differently: Inject 10 mg into the muscle every Tuesday.)   Multiple Vitamins-Minerals (MULTIVITAMINS THER. W/MINERALS) TABS Take 1 tablet by mouth at bedtime.    omeprazole (PRILOSEC) 20 MG capsule TAKE 1 CAPSULE BY MOUTH  EVERY DAY (Patient taking differently: Take 20 mg by mouth daily.)   traMADol (ULTRAM) 50 MG tablet Take 1 - 2 every 8 hours as needed for pain. (Patient taking differently: Take 50-100 mg by mouth every 8 (eight) hours as needed (pain).)   TUBERCULIN SYR 1CC/27GX1/2" (B-D TB SYRINGE 1CC/27GX1/2") 27G X 1/2" 1 ML MISC USE ONE SYRINGE ONCE WEEKLY AS DIRECTED   Upadacitinib ER (RINVOQ) 15 MG TB24 Take 15 mg by mouth daily. (Patient taking differently: Take 15 mg by mouth at bedtime.)   Xylitol (XYLIMELTS MT) Use as directed 2 tablets in the mouth or throat at bedtime as needed (dry mouth).    No current facility-administered medications on file prior to visit.     Allergies:   Patient has no known allergies.   Social History   Tobacco Use   Smoking status: Current Every Day Smoker    Packs/day: 1.00    Years: 34.00    Pack years: 34.00    Types: Cigarettes    Start date: 1979   Smokeless tobacco: Never Used  Scientific laboratory technician Use: Never used  Substance Use Topics   Alcohol use: Yes    Alcohol/week: 0.0 standard drinks    Comment: Rare, twice per year   Drug use: Never    Family History: family history includes Arthritis in her mother; Heart Problems in her  father; Heart disease in her father and paternal uncle.  ROS:   Please see the history of present illness.  Additional pertinent ROS: Constitutional: Negative for chills, fever, night sweats, unintentional weight loss  HENT: Negative for ear pain and hearing loss.   Eyes: Negative for loss of vision and eye pain.  Respiratory: Negative for cough, sputum, wheezing.   Cardiovascular: See HPI. Gastrointestinal: Negative for abdominal pain, melena, and hematochezia.  Genitourinary: Negative for dysuria and hematuria.  Musculoskeletal: Negative for falls and myalgias.  Skin: Negative for itching and rash.  Neurological: Negative for focal weakness, focal sensory changes and loss of consciousness.  Endo/Heme/Allergies: Does not bruise/bleed easily.     EKGs/Labs/Other Studies Reviewed:    The following studies were reviewed today: No prior cardiac studies CT noncon 12/29/19 noted mild aortic atherosclerosis and LAD coronary calcium.  EKG:  EKG is personally reviewed.  The ekg ordered today demonstrates SR at 89 bpm with PAC  Recent Labs: 04/21/2020: ALT 14 05/21/2020: BUN 17; Creatinine, Ser 1.18; Potassium 3.6; Sodium 138 06/14/2020: Hemoglobin 12.4; Platelets 260  Recent Lipid Panel    Component Value Date/Time   CHOL 237 (H) 06/14/2020 0812   TRIG 131 06/14/2020 0812   HDL 52 06/14/2020 0812   CHOLHDL 4.6 06/14/2020 0812   VLDL 14 09/09/2015 0826   LDLCALC 159 (H) 06/14/2020 0812    Physical Exam:    VS:  BP 118/80    Pulse 89    Ht 5\' 6"  (1.676 m)    Wt 175 lb 3.2 oz (79.5 kg)    BMI 28.28 kg/m     Wt Readings from Last 3 Encounters:  06/16/20 175 lb 3.2 oz (79.5 kg)  06/14/20 178 lb (80.7 kg)  06/02/20 177 lb (80.3 kg)    GEN: Well nourished, well developed in no acute distress HEENT: Normal, moist mucous membranes NECK: No JVD CARDIAC: regular rhythm, normal S1 and S2, no rubs or gallops. No murmur. VASCULAR: Radial and DP pulses 2+ bilaterally. No carotid  bruits RESPIRATORY:  Clear to auscultation without rales, wheezing or rhonchi  ABDOMEN: Soft, non-tender, non-distended MUSCULOSKELETAL:  Ambulates independently SKIN: Warm and dry, no edema NEUROLOGIC:  Alert and oriented x 3. No focal neuro deficits noted. PSYCHIATRIC:  Normal affect    ASSESSMENT:    1. Coronary artery calcification seen on CT scan   2. Aortic atherosclerosis ([place])   3. Family history of heart disease   4. Cardiac risk counseling   5. Nutritional counseling   6. Tobacco abuse counseling   7. Counseling on health promotion and disease prevention   8. Exercise counseling   9. Chest pain of uncertain etiology   10. Medication management    PLAN:    Family history of heart disease (father's side) Long term inflammatory disease (rheumatoid arthritis) tobacco use Aortic atherosclerosis Coronary calcium Atypical chest pain -no anginal chest pain, does have atypical chest pain/chronic costochondritis (improves with prednisone) -discussed recommendations for aspirin, statin. She is amenable. Start today, recheck lipids/lfts -counseled on red flag warning signs that need immediate medical attention  Tobacco cessation couseling: counseled on cessation, she is working on this  Cardiac risk counseling and prevention recommendations: -recommend heart healthy/Mediterranean diet, with whole grains, fruits, vegetable, fish, lean meats, nuts, and olive oil. Limit salt. -recommend moderate walking, 3-5 times/week for 30-50 minutes each session. Aim for at least 150 minutes.week. Goal should be pace of 3 miles/hours, or walking 1.5 miles in 30 minutes -recommend avoidance of tobacco products. Avoid excess alcohol. -ASCVD risk score: The 10-year ASCVD risk score Mikey Bussing DC Brooke Bonito., et al., 2013) is: 7.4%   Values used to calculate the score:     Age: 73 years     Sex: Female     Is Non-Hispanic African American: No     Diabetic: No     Tobacco smoker: Yes     Systolic Blood  Pressure: 118 mmHg     Is BP treated: No     HDL Cholesterol: 52 mg/dL     Total Cholesterol: 237 mg/dL    Plan for follow up: 1 year or sooner as needed  Buford Dresser, MD, PhD [place]   [facility]    Medication Adjustments/Labs and Tests Ordered: Current medicines are reviewed at length with the patient today.  Concerns regarding medicines are outlined above.  Orders Placed This Encounter  Procedures   Lipid panel   Hepatic function panel   EKG 12-Lead  Meds ordered this encounter  Medications   rosuvastatin (CRESTOR) 20 MG tablet    Sig: Take 1 tablet (20 mg total) by mouth daily.    Dispense:  90 tablet    Refill:  3   aspirin EC 81 MG tablet    Sig: Take 1 tablet (81 mg total) by mouth daily. Swallow whole.    Dispense:  90 tablet    Refill:  3    Patient Instructions  Medication Instructions:  Start Rosuvastatin 20 mg daily         Aspirin 81 mg daily  *If you need a refill on your cardiac medications before your next appointment, please call your pharmacy*   Lab Work: Your physician recommends that you return for lab work in March, 2022 (Fasting lipid, LFT).  If you have labs (blood work) drawn today and your tests are completely normal, you will receive your results only by: [place] (if you have MyChart) OR A paper copy in the mail If you have any lab test that is abnormal or we need to change your treatment, we will call you to review the results.   Testing/Procedures: None   Follow-Up: At [facility], you and your health needs are our priority.  As part of our continuing mission to provide you with exceptional heart care, we have created designated Provider Care Teams.  These Care Teams include your primary Cardiologist (physician) and Advanced Practice Providers (APPs -  Physician Assistants and Nurse Practitioners) who all work together to provide you with the care you need, when you need it.  We recommend signing up for  the patient portal called "MyChart".  Sign up information is provided on this After Visit Summary.  MyChart is used to connect with patients for Virtual Visits (Telemedicine).  Patients are able to view lab/test results, encounter notes, upcoming appointments, etc.  Non-urgent messages can be sent to your provider as well.   To learn more about what you can do with MyChart, go to [email or web address].    Your next appointment:   1 year(s)  The format for your next appointment:   In Person  Provider:   Buford Dresser, MD      [place] refers to food and lifestyle choices that are based on the traditions of countries located on the [place]. This way of eating has been shown to help prevent certain conditions and improve outcomes for people who have chronic diseases, like kidney disease and heart disease. What are tips for following this plan? Lifestyle Cook and eat meals together with your family, when possible. Drink enough fluid to keep your urine clear or pale yellow. Be physically active every day. This includes: Aerobic exercise like running or swimming. Leisure activities like gardening, walking, or housework. Get 7-8 hours of sleep each night. If recommended by your health care provider, drink red wine in moderation. This means 1 glass a day for nonpregnant women and 2 glasses a day for men. A glass of wine equals 5 oz (150 mL). Reading food labels  Check the serving size of packaged foods. For foods such as rice and pasta, the serving size refers to the amount of cooked product, not dry. Check the total fat in packaged foods. Avoid foods that have saturated fat or trans fats. Check the ingredients list for added sugars, such as corn syrup. Shopping At the grocery store, buy most of your food from the areas near the walls of the store. This  includes: Fresh fruits and vegetables (produce). Grains, beans, nuts, and seeds. Some  of these may be available in unpackaged forms or large amounts (in bulk). Fresh seafood. Poultry and eggs. Low-fat dairy products. Buy whole ingredients instead of prepackaged foods. Buy fresh fruits and vegetables in-season from local farmers markets. Buy frozen fruits and vegetables in resealable bags. If you do not have access to quality fresh seafood, buy precooked frozen shrimp or canned fish, such as tuna, salmon, or sardines. Buy small amounts of raw or cooked vegetables, salads, or olives from the deli or salad bar at your store. Stock your pantry so you always have certain foods on hand, such as olive oil, canned tuna, canned tomatoes, rice, pasta, and beans. Cooking Cook foods with extra-virgin olive oil instead of using butter or other vegetable oils. Have meat as a side dish, and have vegetables or grains as your main dish. This means having meat in small portions or adding small amounts of meat to foods like pasta or stew. Use beans or vegetables instead of meat in common dishes like chili or lasagna. Experiment with different cooking methods. Try roasting or broiling vegetables instead of steaming or sauteing them. Add frozen vegetables to soups, stews, pasta, or rice. Add nuts or seeds for added healthy fat at each meal. You can add these to yogurt, salads, or vegetable dishes. Marinate fish or vegetables using olive oil, lemon juice, garlic, and fresh herbs. Meal planning  Plan to eat 1 vegetarian meal one day each week. Try to work up to 2 vegetarian meals, if possible. Eat seafood 2 or more times a week. Have healthy snacks readily available, such as: Vegetable sticks with hummus. Greek yogurt. Fruit and nut trail mix. Eat balanced meals throughout the week. This includes: Fruit: 2-3 servings a day Vegetables: 4-5 servings a day Low-fat dairy: 2 servings a day Fish, poultry, or lean meat: 1 serving a day Beans and legumes: 2 or more servings a week Nuts and seeds:  1-2 servings a day Whole grains: 6-8 servings a day Extra-virgin olive oil: 3-4 servings a day Limit red meat and sweets to only a few servings a month What are my food choices? Mediterranean diet Recommended Grains: Whole-grain pasta. Brown rice. Bulgar wheat. Polenta. Couscous. Whole-wheat bread. [place]. Vegetables: Artichokes. Beets. Broccoli. Cabbage. Carrots. Eggplant. Green beans. Chard. Kale. Spinach. Onions. Leeks. Peas. Squash. Tomatoes. Peppers. Radishes. Fruits: Apples. Apricots. Avocado. Berries. Bananas. Cherries. Dates. Figs. Grapes. Lemons. Melon. Oranges. Peaches. Plums. Pomegranate. Meats and other protein foods: Beans. Almonds. Sunflower seeds. Pine nuts. Peanuts. [place]. Salmon. Scallops. Shrimp. [place]. Tilapia. Clams. Oysters. Eggs. Dairy: Low-fat milk. Cheese. Greek yogurt. Beverages: Water. Red wine. Herbal tea. Fats and oils: Extra virgin olive oil. Avocado oil. Grape seed oil. Sweets and desserts: [place] yogurt with honey. Baked apples. Poached pears. Trail mix. Seasoning and other foods: Basil. Cilantro. Coriander. Cumin. Mint. Parsley. Sage. Rosemary. Tarragon. Garlic. Oregano. Thyme. Pepper. Balsalmic vinegar. Tahini. Hummus. Tomato sauce. Olives. Mushrooms. Limit these Grains: Prepackaged pasta or rice dishes. Prepackaged cereal with added sugar. Vegetables: Deep fried potatoes (french fries). Fruits: Fruit canned in syrup. Meats and other protein foods: Beef. Pork. Lamb. Poultry with skin. Hot dogs. Bernie[place] Salines. Dairy: Ice cream. Sour cream. Whole milk. Beverages: Juice. Sugar-sweetened soft drinks. Beer. Liquor and spirits. Fats and oils: Butter. Canola oil. Vegetable oil. Beef fat (tallow). Lard. Sweets and desserts: Cookies. Cakes. Pies. Candy. Seasoning and other foods: Mayonnaise. Premade sauces and marinades. The items listed may not be a complete  list. Talk with your dietitian about what dietary choices are right for you. Summary The Mediterranean diet  includes both food and lifestyle choices. Eat a variety of fresh fruits and vegetables, beans, nuts, seeds, and whole grains. Limit the amount of red meat and sweets that you eat. Talk with your health care provider about whether it is safe for you to drink red wine in moderation. This means 1 glass a day for nonpregnant women and 2 glasses a day for men. A glass of wine equals 5 oz (150 mL). This information is not intended to replace advice given to you by your health care provider. Make sure you discuss any questions you have with your health care provider. Document Revised: 02/17/2016 Document Reviewed: 02/10/2016 Elsevier Patient Education  2020 [employer].  Signed, Buford Dresser, MD PhD 06/16/2020    [place]

## 2020-06-16 NOTE — Patient Instructions (Addendum)
Medication Instructions:  Start Rosuvastatin 20 mg daily         Aspirin 81 mg daily  *If you need a refill on your cardiac medications before your next appointment, please call your pharmacy*   Lab Work: Your physician recommends that you return for lab work in March, 2022 (Fasting lipid, LFT).  If you have labs (blood work) drawn today and your tests are completely normal, you will receive your results only by: Marland Kitchen MyChart Message (if you have MyChart) OR . A paper copy in the mail If you have any lab test that is abnormal or we need to change your treatment, we will call you to review the results.   Testing/Procedures: None   Follow-Up: At [facility], you and your health needs are our priority.  As part of our continuing mission to provide you with exceptional heart care, we have created designated Provider Care Teams.  These Care Teams include your primary Cardiologist (physician) and Advanced Practice Providers (APPs -  Physician Assistants and Nurse Practitioners) who all work together to provide you with the care you need, when you need it.  We recommend signing up for the patient portal called "MyChart".  Sign up information is provided on this After Visit Summary.  MyChart is used to connect with patients for Virtual Visits (Telemedicine).  Patients are able to view lab/test results, encounter notes, upcoming appointments, etc.  Non-urgent messages can be sent to your provider as well.   To learn more about what you can do with MyChart, go to [email or web address].    Your next appointment:   1 year(s)  The format for your next appointment:   In Person  Provider:   Buford Dresser, MD      [place] refers to food and lifestyle choices that are based on the traditions of countries located on the [place]. This way of eating has been shown to help prevent certain conditions and improve outcomes for people who have  chronic diseases, like kidney disease and heart disease. What are tips for following this plan? Lifestyle  Cook and eat meals together with your family, when possible.  Drink enough fluid to keep your urine clear or pale yellow.  Be physically active every day. This includes: ? Aerobic exercise like running or swimming. ? Leisure activities like gardening, walking, or housework.  Get 7-8 hours of sleep each night.  If recommended by your health care provider, drink red wine in moderation. This means 1 glass a day for nonpregnant women and 2 glasses a day for men. A glass of wine equals 5 oz (150 mL). Reading food labels   Check the serving size of packaged foods. For foods such as rice and pasta, the serving size refers to the amount of cooked product, not dry.  Check the total fat in packaged foods. Avoid foods that have saturated fat or trans fats.  Check the ingredients list for added sugars, such as corn syrup. Shopping  At the grocery store, buy most of your food from the areas near the walls of the store. This includes: ? Fresh fruits and vegetables (produce). ? Grains, beans, nuts, and seeds. Some of these may be available in unpackaged forms or large amounts (in bulk). ? Fresh seafood. ? Poultry and eggs. ? Low-fat dairy products.  Buy whole ingredients instead of prepackaged foods.  Buy fresh fruits and vegetables in-season from local farmers markets.  Buy frozen fruits and vegetables in resealable bags.  If you do not have access to quality fresh seafood, buy precooked frozen shrimp or canned fish, such as tuna, salmon, or sardines.  Buy small amounts of raw or cooked vegetables, salads, or olives from the deli or salad bar at your store.  Stock your pantry so you always have certain foods on hand, such as olive oil, canned tuna, canned tomatoes, rice, pasta, and beans. Cooking  Cook foods with extra-virgin olive oil instead of using butter or other vegetable  oils.  Have meat as a side dish, and have vegetables or grains as your main dish. This means having meat in small portions or adding small amounts of meat to foods like pasta or stew.  Use beans or vegetables instead of meat in common dishes like chili or lasagna.  Experiment with different cooking methods. Try roasting or broiling vegetables instead of steaming or sauteing them.  Add frozen vegetables to soups, stews, pasta, or rice.  Add nuts or seeds for added healthy fat at each meal. You can add these to yogurt, salads, or vegetable dishes.  Marinate fish or vegetables using olive oil, lemon juice, garlic, and fresh herbs. Meal planning   Plan to eat 1 vegetarian meal one day each week. Try to work up to 2 vegetarian meals, if possible.  Eat seafood 2 or more times a week.  Have healthy snacks readily available, such as: ? Vegetable sticks with hummus. ? [place] yogurt. ? Fruit and nut trail mix.  Eat balanced meals throughout the week. This includes: ? Fruit: 2-3 servings a day ? Vegetables: 4-5 servings a day ? Low-fat dairy: 2 servings a day ? Fish, poultry, or lean meat: 1 serving a day ? Beans and legumes: 2 or more servings a week ? Nuts and seeds: 1-2 servings a day ? Whole grains: 6-8 servings a day ? Extra-virgin olive oil: 3-4 servings a day  Limit red meat and sweets to only a few servings a month What are my food choices?  Mediterranean diet ? Recommended  Grains: Whole-grain pasta. Brown rice. Bulgar wheat. Polenta. Couscous. Whole-wheat bread. [place].  Vegetables: Artichokes. Beets. Broccoli. Cabbage. Carrots. Eggplant. Green beans. Chard. Kale. Spinach. Onions. Leeks. Peas. Squash. Tomatoes. Peppers. Radishes.  Fruits: Apples. Apricots. Avocado. Berries. Bananas. Cherries. Dates. Figs. Grapes. Lemons. Melon. Oranges. Peaches. Plums. Pomegranate.  Meats and other protein foods: Beans. Almonds. Sunflower seeds. Pine nuts. Peanuts. [place]. Salmon.  Scallops. Shrimp. Queets. Tilapia. Clams. Oysters. Eggs.  Dairy: Low-fat milk. Cheese. Greek yogurt.  Beverages: Water. Red wine. Herbal tea.  Fats and oils: Extra virgin olive oil. Avocado oil. Grape seed oil.  Sweets and desserts: [place] yogurt with honey. Baked apples. Poached pears. Trail mix.  Seasoning and other foods: Basil. Cilantro. Coriander. Cumin. Mint. Parsley. Sage. Rosemary. Tarragon. Garlic. Oregano. Thyme. Pepper. Balsalmic vinegar. Tahini. Hummus. Tomato sauce. Olives. Mushrooms. ? Limit these  Grains: Prepackaged pasta or rice dishes. Prepackaged cereal with added sugar.  Vegetables: Deep fried potatoes (french fries).  Fruits: Fruit canned in syrup.  Meats and other protein foods: Beef. Pork. Lamb. Poultry with skin. Hot dogs. [place] Salines.  Dairy: Ice cream. Sour cream. Whole milk.  Beverages: Juice. Sugar-sweetened soft drinks. Beer. Liquor and spirits.  Fats and oils: Butter. Canola oil. Vegetable oil. Beef fat (tallow). Lard.  Sweets and desserts: Cookies. Cakes. Pies. Candy.  Seasoning and other foods: Mayonnaise. Premade sauces and marinades. The items listed may not be a complete list. Talk with your dietitian about what dietary choices are right for  you. Summary  The Mediterranean diet includes both food and lifestyle choices.  Eat a variety of fresh fruits and vegetables, beans, nuts, seeds, and whole grains.  Limit the amount of red meat and sweets that you eat.  Talk with your health care provider about whether it is safe for you to drink red wine in moderation. This means 1 glass a day for nonpregnant women and 2 glasses a day for men. A glass of wine equals 5 oz (150 mL). This information is not intended to replace advice given to you by your health care provider. Make sure you discuss any questions you have with your health care provider. Document Revised: 02/17/2016 Document Reviewed: 02/10/2016 Elsevier Patient Education  [place].

## 2020-06-17 ENCOUNTER — Inpatient Hospital Stay: Admission: RE | Admit: 2020-06-17 | Payer: 59 | Source: Ambulatory Visit

## 2020-06-18 NOTE — Progress Notes (Signed)
Office Visit Note  Patient: Courtney Myers             Date of Birth: [birth year]           MRN: [id number]             PCP: Susy Frizzle, MD Referring: Susy Frizzle, MD Visit Date: 06/21/2020 Occupation: @GUAROCC @  Subjective:  Pain in multiple joints   History of Present Illness: Darnella Zeiter is a 59 y.o. female with history of seronegative rheumatoid arthritis and fibromyalgia.  She was started on rinvoq on 04/28/20 and has been tolerating it without any side effects.  She states she initially noticed significant improvement on rinvoq.  She had to reduce methotrexate from 0.4 to 0.3 ml due to elevated creatinine.  She discontinued taking aleve PM in October 2021.  She states over the past several weeks she has been experiencing increased pain and swelling in multiple joints.  She states that at times it feels as though her knee joints and ankles are on fire.  She has been experiencing intermittent swelling in both wrist joints.  She has had difficulty performing ADLs as well as responsibilities at work due to the severity of pain and inflammation at times.   Activities of Daily Living:  Patient reports morning stiffness for several hours.   Patient Reports nocturnal pain.  Difficulty dressing/grooming: Reports Difficulty climbing stairs: Reports Difficulty getting out of chair: Reports Difficulty using hands for taps, buttons, cutlery, and/or writing: Reports  Review of Systems  Constitutional: Positive for fatigue.  HENT: Positive for mouth dryness and nose dryness. Negative for mouth sores.   Eyes: Positive for pain and dryness. Negative for itching and visual disturbance.  Respiratory: Positive for shortness of breath. Negative for cough, hemoptysis and difficulty breathing.   Cardiovascular: Negative for chest pain, palpitations, hypertension and swelling in legs/feet.  Gastrointestinal: Negative for blood in stool, constipation and diarrhea.   Endocrine: Negative for increased urination.  Genitourinary: Negative for difficulty urinating and painful urination.  Musculoskeletal: Positive for arthralgias, joint pain, joint swelling and morning stiffness. Negative for myalgias, muscle weakness, muscle tenderness and myalgias.  Skin: Negative for color change, pallor, rash, hair loss, nodules/bumps, redness, skin tightness, ulcers and sensitivity to sunlight.  Allergic/Immunologic: Negative for susceptible to infections.  Neurological: Positive for weakness. Negative for dizziness, numbness, headaches and memory loss.  Hematological: Negative for bruising/bleeding tendency and swollen glands.  Psychiatric/Behavioral: Negative for depressed mood, confusion and sleep disturbance. The patient is not nervous/anxious.     PMFS History:  Patient Active Problem List   Diagnosis Date Noted  . Encounter for screening for lung cancer 12/29/2019  . Excessive sleepiness 09/08/2014  . Fibromyalgia syndrome 08/25/2014  . RA (rheumatoid arthritis) ([place])   . GERD (gastroesophageal reflux disease)   . Anxiety   . Depression   . History of shingles   . Synovitis of hand   . Cervical dystonia 12/04/2012    Past Medical History:  Diagnosis Date  . Anxiety   . Cervical dystonia   . Depression   . Fibromyalgia   . GERD (gastroesophageal reflux disease)   . History of shingles   . RA (rheumatoid arthritis) ([place])   . Synovitis of hand     Family History  Problem Relation Age of Onset  . Arthritis Mother   . Heart Problems Father   . Heart disease Father   . Heart disease Paternal Uncle        MI  Past Surgical History:  Procedure Laterality Date  . TONSILLECTOMY    . 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 Right handed.    Caffeine- 4 cups daily.   Immunization History   Administered Date(s) Administered  . Influenza,inj,Quad PF,6+ Mos 05/25/2018, 04/03/2019, 04/26/2020  . Influenza-Unspecified 06/24/2014, 05/01/2015, 04/10/2016, 04/06/2017  . Moderna Sars-Covid-2 Vaccination 08/29/2019, 09/26/2019, 06/19/2020  . Pneumococcal Conjugate-13 05/01/2015  . Pneumococcal Polysaccharide-23 04/10/2016  . Tdap 09/09/2015     Objective: Vital Signs: BP 118/77 (BP Location: Left Arm, Patient Position: Sitting, Cuff Size: Normal)   Pulse 89   Resp 15   Ht 5\' 6"  (1.676 m)   Wt 177 lb 3.2 oz (80.4 kg)   BMI 28.60 kg/m    Physical Exam Vitals and nursing note reviewed.  Constitutional:      Appearance: She is well-developed and well-nourished.  HENT:     Head: Normocephalic and atraumatic.  Eyes:     Extraocular Movements: EOM normal.     Conjunctiva/sclera: Conjunctivae normal.  Cardiovascular:     Pulses: Intact distal pulses.  Pulmonary:     Effort: Pulmonary effort is normal.  Abdominal:     Palpations: Abdomen is soft.  Musculoskeletal:     Cervical back: Normal range of motion.  Skin:    General: Skin is warm and dry.     Capillary Refill: Capillary refill takes less than 2 seconds.  Neurological:     Mental Status: She is alert and oriented to person, place, and time.  Psychiatric:        Mood and Affect: Mood and affect normal.        Behavior: Behavior normal.      Musculoskeletal Exam:  C-spine, thoracic spine, and lumbar spine good ROM.  Shoulder joints and elbow joints good ROM with no discomfort.  Flexor tenosynovitis of the right wrist.  Left wrist joint has good ROM with no discomfort.  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  No tenderness or synovitis of MCP or PIP joints.  Hip joints, knee joints, and ankle joints good ROM with no discomfort. Swelling in the left knee.  Tenderness of both ankle joints.    CDAI Exam: CDAI Score: 1.4  Patient Global: 8 mm; Provider Global: 6 mm Swollen: 0 ; Tender: 0  Joint Exam  06/21/2020   No joint exam has been documented for this visit   There is currently no information documented on the homunculus. Go to the Rheumatology activity and complete the homunculus joint exam.  Investigation: No additional findings.  Imaging: CT Angio Chest W/Cm &/Or Wo Cm  Result Date: 06/16/2020 CLINICAL DATA:  Shortness of breath, chest pain. EXAM: CT ANGIOGRAPHY CHEST WITH CONTRAST TECHNIQUE: Multidetector CT imaging of the chest was performed using the standard protocol during bolus administration of intravenous contrast. Multiplanar CT image reconstructions and MIPs were obtained to evaluate the vascular anatomy. CONTRAST:  70mL ISOVUE-370 IOPAMIDOL (ISOVUE-370) INJECTION 76% COMPARISON:  December 29, 2019. FINDINGS: Cardiovascular: Satisfactory opacification of the pulmonary arteries to the segmental level. No evidence of pulmonary embolism. Normal heart size. No pericardial effusion. Mediastinum/Nodes: No enlarged mediastinal, hilar, or axillary lymph nodes. Thyroid gland, trachea, and esophagus demonstrate no significant findings. Lungs/Pleura: No pneumothorax or pleural effusion is noted. Emphysematous disease is noted. Stable 5 mm subpleural nodule is noted laterally in the right upper lobe  best seen on image number 51 of series 15. Stable patchy peripheral opacities are noted in both lungs most consistent with chronic interstitial lung disease or scarring. Stable 5 mm subpleural nodule is noted anteriorly in the right middle lobe best seen on image number 86 of series 15. Upper Abdomen: No acute abnormality. Musculoskeletal: No chest wall abnormality. No acute or significant osseous findings. Review of the MIP images confirms the above findings. IMPRESSION: 1. No definite evidence of pulmonary embolus. 2. Stable patchy peripheral opacities are noted in both lungs most consistent with chronic interstitial lung disease or scarring. 3. Stable right lung nodules are noted, the largest  measuring 5 mm. No follow-up needed if patient is low-risk (and has no known or suspected primary neoplasm). Non-contrast chest CT can be considered in 12 months if patient is high-risk. This recommendation follows the consensus statement: Guidelines for Management of Incidental Pulmonary Nodules Detected on CT Images: From the Fleischner Society 2017; Radiology 2017; 284:228-243. 4. Emphysema. Emphysema (ICD10-J43.9). Electronically Signed   By: Marijo Conception M.D.   On: 06/16/2020 09:30    Recent Labs: Lab Results  Component Value Date   WBC 9.4 06/14/2020   HGB 12.4 06/14/2020   PLT 260 06/14/2020   NA 138 05/21/2020   K 3.6 05/21/2020   CL 103 05/21/2020   CO2 26 05/21/2020   GLUCOSE 161 (H) 05/21/2020   BUN 17 05/21/2020   CREATININE 1.18 (H) 05/21/2020   BILITOT 0.4 04/21/2020   ALKPHOS 71 03/13/2019   AST 19 04/21/2020   ALT 14 04/21/2020   PROT 7.1 04/21/2020   ALBUMIN 3.6 03/13/2019   CALCIUM 9.5 05/21/2020   GFRAA 67 04/21/2020   QFTBGOLD Negative 09/14/2015   QFTBGOLDPLUS NEGATIVE 03/24/2020    Speciality Comments: Remicade 6mg /kg every 6 weeks @ [facility]   Procedures:  No procedures performed Allergies: Patient has no known allergies.   Assessment / Plan:     Visit Diagnoses: Rheumatoid arthritis of multiple sites with negative rheumatoid factor (HCC) - Inadequate response to Remicade 6 mg/kg IV infusions every 6 weeks-Started in July 2013-last infusion on 03/16/20: She presents today with flexor tenosynovitis of the right wrist.  She has warmth and swelling of the left knee joint and tenderness over both ankle joints.  No tenderness or synovitis of MCP joints noted.  She has been experiencing frequent and severe flares in multiple joints over the past several weeks.  She is currently taking rinvoq 15 mg 1 tablet by mouth daily, MTX 0.3 ml sq injections once weekly, and folic acid 1 mg daily.  She started on rinvoq on 04/28/20 due to inadequate response to  remicade and has been tolerating it without any side effects. She initially noticed significant clinical improvement switching to rinvoq and felt better than she had in years.  She reduced the dose of MTX from 0.4 to 0.3 ml/wk due to elevated creatinine in October 2021.  She has since discontinued taking aleve PM at bedtime as recommended. Discussed that we cannot increase MTX to the therapeutic dose to try to increase efficacy due to elevated creatinine.  Discussed different treatment options today in detail.  She would like to give rinvoq more time to start working.  Discussed switching her from low dose MTX to [place].  She will start on arava 10 mg daily for 2 weeks and if labs are stable she will increase to 20 mg daily.  She will return for lab work in 2 weeks x2, then 2  months, then every 3 months.  Prescription for arava pending lab results today.  A prednisone taper starting at 20 mg tapering by 5 mg every 4 days will be sent to the pharmacy.  She will follow up in the office in 6 weeks to assess her response to rinvoq and arava combination therapy.   Medication counseling:   Baseline Immunosuppressant Therapy Labs  Quantiferon TB Gold Latest Ref Rng & Units 03/24/2020  Quantiferon TB Gold Plus NEGATIVE NEGATIVE    Hepatitis Latest Ref Rng & Units 03/24/2020  Hep B Surface Ag NON-REACTI NON-REACTIVE  Hep B IgM NON-REACTI NON-REACTIVE  Hep C Ab NEGATIVE -  Hep C Ab NON-REACTI NON-REACTIVE  Hep C Ab NON-REACTI NON-REACTIVE  Hep A IgM NON-REACTI NON-REACTIVE    Lab Results  Component Value Date   HIV NON REACTIVE 04/24/2013    Immunoglobulin Electrophoresis Latest Ref Rng & Units 03/24/2020  IgA  70 - 320 mg/dL 180  IgG 600 - 1,540 mg/dL 1,213  IgM 50 - 300 mg/dL 790(H)    Serum Protein Electrophoresis Latest Ref Rng & Units 04/21/2020  Total Protein 6.1 - 8.1 g/dL 7.1  Albumin 3.8 - 4.8 g/dL -  Alpha-1 0.2 - 0.3 g/dL -  Alpha-2 0.5 - 0.9 g/dL -  Beta Globulin 0.4 - 0.6 g/dL -   Beta 2 0.2 - 0.5 g/dL -  Gamma Globulin 0.8 - 1.7 g/dL -    Patient was counseled on the purpose, proper use, and adverse effects of leflunomide including risk of infection, nausea/diarrhea/weight loss, increase in blood pressure, rash, hair loss, tingling in the hands and feet, and signs and symptoms of interstitial lung disease.   Also counseled on Black Box warning of liver injury and importance of avoiding alcohol while on therapy. Discussed that there is the possibility of an increased risk of malignancy but it is not well understood if this increased risk is due to the medication or the disease state.  Counseled patient to avoid live vaccines. Recommend annual influenza, Pneumovax 23, Prevnar 13, and Shingrix as indicated.   Discussed the importance of frequent monitoring of liver function and blood count.  Standing orders placed.  Discussed importance of birth control while on leflunomide due to risk of congenital abnormalities, and patient confirms she is postmenopausal.  Provided patient with educational materials on leflunomide and answered all questions.  Patient consented to [place] use, and consent will be uploaded into the media tab.   Patient dose will be 10 mg daily x2 weeks and if labs are stable she will increase to 20 mg daily.  Prescription pending lab results and/or insurance approval.  High risk medication use - Rinvoq 15 mg 1 tablet by mouth daily.  She will be starting on arava 10 mg daily x2 weeks and if labs are stable she will increase to 20 mg daily.  She will discontinue MTX 0.3 ml sq injections once weekly and folic acid 1 mg daily due to elevated creatinine even on the reduced dose.  Inadequate response to remicade.   - Plan: CBC with Differential/Platelet, COMPLETE METABOLIC PANEL WITH GFR Discussed black box warning for jak inhibitors.   Association of heart disease with rheumatoid arthritis was discussed. Need to monitor blood pressure, cholesterol, and to exercise 30-60  minutes on daily basis was discussed. Poor dental hygiene can be a predisposing factor for rheumatoid arthritis. Good dental hygiene was discussed. She is planning to quit smoking.  Discussed the importance of tobacco cessation.    She  has not had any recent infections.  She is aware that she is to hold rinvoq and arava if she develops signs or symptoms of an infection and to resume once the infection has cleared.   Fibromyalgia: She has not had any recent fibromyalgia flares.  She has no tender points on examination today.  She has chronic fatigue which has been stable. She continues to take cymbalta as prescribed, tramadol, and baclofen as needed.    Primary insomnia: She has interrupted sleep at night due to nocturnal pain.   Chronic fatigue: Stable.   Other medical conditions are listed as follows:   History of gastroesophageal reflux (GERD)  Recurrent UTI: No recent infections.   Anxiety and depression  Orders: Orders Placed This Encounter  Procedures  . CBC with Differential/Platelet  . COMPLETE METABOLIC PANEL WITH GFR   No orders of the defined types were placed in this encounter.     Follow-Up Instructions: Return in about 6 weeks (around 08/02/2020) for Rheumatoid arthritis, Fibromyalgia.   Ofilia Neas, PA-C  Note - This record has been created using Dragon software.  Chart creation errors have been sought, but may not always  have been located. Such creation errors do not reflect on  the standard of medical care.

## 2020-06-21 ENCOUNTER — Encounter: Payer: Self-pay | Admitting: Physician Assistant

## 2020-06-21 ENCOUNTER — Ambulatory Visit: Payer: 59 | Admitting: Physician Assistant

## 2020-06-21 ENCOUNTER — Other Ambulatory Visit: Payer: Self-pay

## 2020-06-21 VITALS — BP 118/77 | HR 89 | Resp 15 | Ht 66.0 in | Wt 177.2 lb

## 2020-06-21 DIAGNOSIS — F5101 Primary insomnia: Secondary | ICD-10-CM

## 2020-06-21 DIAGNOSIS — F32A Depression, unspecified: Secondary | ICD-10-CM

## 2020-06-21 DIAGNOSIS — M0609 Rheumatoid arthritis without rheumatoid factor, multiple sites: Secondary | ICD-10-CM | POA: Diagnosis not present

## 2020-06-21 DIAGNOSIS — F419 Anxiety disorder, unspecified: Secondary | ICD-10-CM

## 2020-06-21 DIAGNOSIS — Z79899 Other long term (current) drug therapy: Secondary | ICD-10-CM

## 2020-06-21 DIAGNOSIS — M797 Fibromyalgia: Secondary | ICD-10-CM

## 2020-06-21 DIAGNOSIS — R5382 Chronic fatigue, unspecified: Secondary | ICD-10-CM

## 2020-06-21 DIAGNOSIS — Z8719 Personal history of other diseases of the digestive system: Secondary | ICD-10-CM

## 2020-06-21 DIAGNOSIS — N390 Urinary tract infection, site not specified: Secondary | ICD-10-CM

## 2020-06-21 MED ORDER — PREDNISONE 5 MG PO TABS: ORAL_TABLET | ORAL | 0 refills | Status: DC

## 2020-06-21 NOTE — Patient Instructions (Addendum)
Standing Labs We placed an order today for your standing lab work.   Please have your standing labs drawn in 2 weeks x2, then 2 months, then every 3 months   If possible, please have your labs drawn 2 weeks prior to your appointment so that the provider can discuss your results at your appointment.  We have open lab daily Monday through Thursday from 8:30-12:30 PM and 1:30-4:30 PM and Friday from 8:30-12:30 PM and 1:30-4:00 PM at the office of Dr. Bo Merino, [facility].   Please be advised, patients with office appointments requiring lab work will take precedents over walk-in lab work.  If possible, please come for your lab work on Monday and Friday afternoons, as you may experience shorter wait times. The office is located at [address] [place], [place], [place] [address] No appointment is necessary.   Labs are drawn by [facility]. Please bring your co-pay at the time of your lab draw.  You may receive a bill from [facility] for your lab work.  If you wish to have your labs drawn at another location, please call the office 24 hours in advance to send orders.  If you have any questions regarding directions or hours of operation,  please call [phone].   As a reminder, please drink plenty of water prior to coming for your lab work. Thanks!   Leflunomide tablets What is this medicine? LEFLUNOMIDE (le FLOO na mide) is for rheumatoid arthritis. This medicine may be used for other purposes; ask your health care provider or pharmacist if you have questions. COMMON BRAND NAME(S): Arava What should I tell my health care provider before I take this medicine? They need to know if you have any of these conditions:  diabetes  have a fever or infection  high blood pressure  immune system problems  kidney disease  liver disease  low blood cell counts, like low white cell, platelet, or red cell counts  lung or breathing disease, like asthma  recently received or  scheduled to receive a vaccine  receiving treatment for cancer  skin conditions or sensitivity  tingling of the fingers or toes, or other nerve disorder  tuberculosis  an unusual or allergic reaction to leflunomide, teriflunomide, other medicines, food, dyes, or preservatives  pregnant or trying to get pregnant  breast-feeding How should I use this medicine? Take this medicine by mouth with a full glass of water. Follow the directions on the prescription label. Take your medicine at regular intervals. Do not take your medicine more often than directed. Do not stop taking except on your doctor's advice. Talk to your pediatrician regarding the use of this medicine in children. Special care may be needed. Overdosage: If you think you have taken too much of this medicine contact a poison control center or emergency room at once. NOTE: This medicine is only for you. Do not share this medicine with others. What if I miss a dose? If you miss a dose, take it as soon as you can. If it is almost time for your next dose, take only that dose. Do not take double or extra doses. What may interact with this medicine? Do not take this medicine with any of the following medications:  teriflunomide This medicine may also interact with the following medications:  alosetron  birth control pills  caffeine  cefaclor  certain medicines for diabetes like nateglinide, repaglinide, rosiglitazone, pioglitazone  certain medicines for high cholesterol like atorvastatin, pravastatin, rosuvastatin, simvastatin  charcoal  cholestyramine  ciprofloxacin  duloxetine  furosemide  ketoprofen  live virus vaccines  medicines that increase your risk for infection  methotrexate  mitoxantrone  paclitaxel  penicillin  theophylline  tizanidine  warfarin This list may not describe all possible interactions. Give your health care provider a list of all the medicines, herbs, non-prescription  drugs, or dietary supplements you use. Also tell them if you smoke, drink alcohol, or use illegal drugs. Some items may interact with your medicine. What should I watch for while using this medicine? Visit your health care provider for regular checks on your progress. Tell your doctor or health care provider if your symptoms do not start to get better or if they get worse. You may need blood work done while you are taking this medicine. This medicine may cause serious skin reactions. They can happen weeks to months after starting the medicine. Contact your health care provider right away if you notice fevers or flu-like symptoms with a rash. The rash may be red or purple and then turn into blisters or peeling of the skin. Or, you might notice a red rash with swelling of the face, lips or lymph nodes in your neck or under your arms. This medicine may stay in your body for up to 2 years after your last dose. Tell your doctor about any unusual side effects or symptoms. A medicine can be given to help lower your blood levels of this medicine more quickly. Women must use effective birth control with this medicine. There is a potential for serious side effects to an unborn child. Do not become pregnant while taking this medicine. Inform your doctor if you wish to become pregnant. This medicine remains in your blood after you stop taking it. You must continue using effective birth control until the blood levels have been checked and they are low enough. A medicine can be given to help lower your blood levels of this medicine more quickly. Immediately talk to your doctor if you think you may be pregnant. You may need a pregnancy test. Talk to your health care provider or pharmacist for more information. You should not receive certain vaccines during your treatment and for a certain time after your treatment with this medication ends. Talk to your health care provider for more information. What side effects may I  notice from receiving this medicine? Side effects that you should report to your doctor or health care professional as soon as possible:  allergic reactions like skin rash, itching or hives, swelling of the face, lips, or tongue  breathing problems  cough  increased blood pressure  low blood counts - this medicine may decrease the number of white blood cells and platelets. You may be at increased risk for infections and bleeding.  pain, tingling, numbness in the hands or feet  rash, fever, and swollen lymph nodes  redness, blistering, peeing or loosening of the skin, including inside the mouth  signs of decreased platelets or bleeding - bruising, pinpoint red spots on the skin, black, tarry stools, blood in urine  signs of infection - fever or chills, cough, sore throat, pain or trouble passing urine  signs and symptoms of liver injury like dark yellow or brown urine; general ill feeling or flu-like symptoms; light-colored stools; loss of appetite; nausea; right upper belly pain; unusually weak or tired; yellowing of the eyes or skin  trouble passing urine or change in the amount of urine  vomiting Side effects that usually do not require medical attention (report  to your doctor or health care professional if they continue or are bothersome):  diarrhea  hair thinning or loss  headache  nausea  tiredness This list may not describe all possible side effects. Call your doctor for medical advice about side effects. You may report side effects to FDA at 1-800-FDA-1088. Where should I keep my medicine? Keep out of the reach of children. Store at room temperature between 15 and 30 degrees C (59 and 86 degrees F). Protect from moisture and light. Throw away any unused medicine after the expiration date. NOTE: This sheet is a summary. It may not cover all possible information. If you have questions about this medicine, talk to your doctor, pharmacist, or health care provider.   2020 Elsevier/Gold Standard (2018-09-20 15:06:48)

## 2020-06-21 NOTE — Telephone Encounter (Signed)
Patient will be starting arava pending lab results, per Hazel Sams, PA-C. Thanks!   Consent obtained and sent to the scan center.

## 2020-06-21 NOTE — Telephone Encounter (Signed)
Prednisone taper per Hazel Sams, PA-C. Please send to the pharmacy. Thanks!

## 2020-06-22 LAB — COMPLETE METABOLIC PANEL WITH GFR
AG Ratio: 1.4 (calc) (ref 1.0–2.5)
ALT: 13 U/L (ref 6–29)
AST: 20 U/L (ref 10–35)
Albumin: 4.2 g/dL (ref 3.6–5.1)
Alkaline phosphatase (APISO): 80 U/L (ref 37–153)
BUN/Creatinine Ratio: 15 (calc) (ref 6–22)
BUN: 16 mg/dL (ref 7–25)
CO2: 27 mmol/L (ref 20–32)
Calcium: 9.5 mg/dL (ref 8.6–10.4)
Chloride: 102 mmol/L (ref 98–110)
Creat: 1.06 mg/dL — ABNORMAL HIGH (ref 0.50–0.99)
GFR, Est African American: 66 mL/min/{1.73_m2} (ref >=60)
GFR, Est Non African American: 57 mL/min/{1.73_m2} — ABNORMAL LOW (ref >=60)
Globulin: 3.1 g/dL (calc) (ref 1.9–3.7)
Glucose, Bld: 86 mg/dL (ref 65–99)
Potassium: 4.1 mmol/L (ref 3.5–5.3)
Sodium: 138 mmol/L (ref 135–146)
Total Bilirubin: 0.4 mg/dL (ref 0.2–1.2)
Total Protein: 7.3 g/dL (ref 6.1–8.1)

## 2020-06-22 LAB — CBC WITH DIFFERENTIAL/PLATELET
Absolute Monocytes: 616 cells/uL (ref 200–950)
Basophils Absolute: 42 cells/uL (ref 0–200)
Basophils Relative: 0.6 %
Eosinophils Absolute: 98 cells/uL (ref 15–500)
Eosinophils Relative: 1.4 %
HCT: 36 % (ref 35.0–45.0)
Hemoglobin: 12.2 g/dL (ref 11.7–15.5)
Lymphs Abs: 2870 cells/uL (ref 850–3900)
MCH: 30 pg (ref 27.0–33.0)
MCHC: 33.9 g/dL (ref 32.0–36.0)
MCV: 88.7 fL (ref 80.0–100.0)
MPV: 12.3 fL (ref 7.5–12.5)
Monocytes Relative: 8.8 %
Neutro Abs: 3374 cells/uL (ref 1500–7800)
Neutrophils Relative %: 48.2 %
Platelets: 187 10*3/uL (ref 140–400)
RBC: 4.06 10*6/uL (ref 3.80–5.10)
RDW: 12.6 % (ref 11.0–15.0)
Total Lymphocyte: 41 %
WBC: 7 10*3/uL (ref 3.8–10.8)

## 2020-06-22 MED ORDER — LEFLUNOMIDE 10 MG PO TABS: ORAL_TABLET | ORAL | 0 refills | Status: DC

## 2020-07-02 ENCOUNTER — Other Ambulatory Visit: Payer: Self-pay | Admitting: Rheumatology

## 2020-07-05 NOTE — Telephone Encounter (Signed)
Last Visit: 06/21/2020 Next Visit: 08/24/2020 Labs: 06/21/2020 CBC WNL. Creatinine is mildly elevated but improving. GFR is 57.  TB Gold: 03/24/2020 Neg   Current Dose per office note 06/21/2020: rinvoq 15 mg 1 tablet by mouth daily  DX: Rheumatoid arthritis of multiple sites with negative rheumatoid factor   Okay to refill Rinvoq?

## 2020-07-06 ENCOUNTER — Other Ambulatory Visit: Payer: Self-pay | Admitting: *Deleted

## 2020-07-06 DIAGNOSIS — Z79899 Other long term (current) drug therapy: Secondary | ICD-10-CM

## 2020-07-06 NOTE — Telephone Encounter (Signed)
Please schedule visit to discuss other treatment options.

## 2020-07-07 LAB — CBC WITH DIFFERENTIAL/PLATELET
Absolute Monocytes: 745 cells/uL (ref 200–950)
Basophils Absolute: 69 cells/uL (ref 0–200)
Basophils Relative: 0.7 %
Eosinophils Absolute: 127 cells/uL (ref 15–500)
Eosinophils Relative: 1.3 %
HCT: 34.2 % — ABNORMAL LOW (ref 35.0–45.0)
Hemoglobin: 11.6 g/dL — ABNORMAL LOW (ref 11.7–15.5)
Lymphs Abs: 3949 cells/uL — ABNORMAL HIGH (ref 850–3900)
MCH: 30.1 pg (ref 27.0–33.0)
MCHC: 33.9 g/dL (ref 32.0–36.0)
MCV: 88.8 fL (ref 80.0–100.0)
MPV: 11.5 fL (ref 7.5–12.5)
Monocytes Relative: 7.6 %
Neutro Abs: 4910 cells/uL (ref 1500–7800)
Neutrophils Relative %: 50.1 %
Platelets: 247 10*3/uL (ref 140–400)
RBC: 3.85 10*6/uL (ref 3.80–5.10)
RDW: 12.9 % (ref 11.0–15.0)
Total Lymphocyte: 40.3 %
WBC: 9.8 10*3/uL (ref 3.8–10.8)

## 2020-07-07 LAB — COMPLETE METABOLIC PANEL WITH GFR
AG Ratio: 1.4 (calc) (ref 1.0–2.5)
ALT: 13 U/L (ref 6–29)
AST: 16 U/L (ref 10–35)
Albumin: 3.9 g/dL (ref 3.6–5.1)
Alkaline phosphatase (APISO): 76 U/L (ref 37–153)
BUN/Creatinine Ratio: 18 (calc) (ref 6–22)
BUN: 22 mg/dL (ref 7–25)
CO2: 29 mmol/L (ref 20–32)
Calcium: 9.1 mg/dL (ref 8.6–10.4)
Chloride: 106 mmol/L (ref 98–110)
Creat: 1.23 mg/dL — ABNORMAL HIGH (ref 0.50–0.99)
GFR, Est African American: 55 mL/min/{1.73_m2} — ABNORMAL LOW (ref >=60)
GFR, Est Non African American: 47 mL/min/{1.73_m2} — ABNORMAL LOW (ref >=60)
Globulin: 2.8 g/dL (calc) (ref 1.9–3.7)
Glucose, Bld: 121 mg/dL — ABNORMAL HIGH (ref 65–99)
Potassium: 4.3 mmol/L (ref 3.5–5.3)
Sodium: 141 mmol/L (ref 135–146)
Total Bilirubin: 0.3 mg/dL (ref 0.2–1.2)
Total Protein: 6.7 g/dL (ref 6.1–8.1)

## 2020-07-07 NOTE — Progress Notes (Signed)
Office Visit Note  Patient: Courtney Myers             Date of Birth: May 26, 1961           MRN: [id number]             PCP: Donita Brooks, MD Referring: Donita Brooks, MD Visit Date: 07/09/2020 Occupation: @GUAROCC @  Subjective:  Pain and swelling in multiple joints   History of Present Illness: Sanjuanita Condrey is a 60 y.o. female negative rheumatoid arthritis and fibromyalgia syndrome.  She was switched from Remicade to Rinvoq on October 26.  She has been on it for 2 months without much improvement.  She states she is having increased pain and swelling in all of her joints.  She was given a prednisone taper on June 29, 2020.  She was also switched from methotrexate to July 01, 2020 the same time due to elevation in the creatinine.  She has not noticed any improvement so far.  She states prednisone helped her for couple of days and then the symptoms recurred.  She complains of discomfort in her shoulders, wrist, hands, knees, ankles.  She has noticed swelling in her wrist and hands.  She states her right knee joint was swollen.  Activities of Daily Living:  Patient reports morning stiffness for all day hours.   Patient Reports nocturnal pain.  Difficulty dressing/grooming: Reports Difficulty climbing stairs: Reports Difficulty getting out of chair: Reports Difficulty using hands for taps, buttons, cutlery, and/or writing: Reports  Review of Systems  Constitutional: Positive for fatigue. Negative for night sweats, weight gain and weight loss.  HENT: Positive for mouth dryness. Negative for mouth sores, trouble swallowing, trouble swallowing and nose dryness.   Eyes: Negative for pain, redness, visual disturbance and dryness.  Respiratory: Negative for cough, shortness of breath and difficulty breathing.   Cardiovascular: Negative for chest pain, palpitations, hypertension, irregular heartbeat and swelling in legs/feet.  Gastrointestinal: Negative for blood in stool,  constipation and diarrhea.  Endocrine: Negative for increased urination.  Genitourinary: Negative for vaginal dryness.  Musculoskeletal: Positive for arthralgias, joint pain, joint swelling and morning stiffness. Negative for myalgias, muscle weakness, muscle tenderness and myalgias.  Skin: Negative for color change, rash, hair loss, skin tightness, ulcers and sensitivity to sunlight.  Allergic/Immunologic: Negative for susceptible to infections.  Neurological: Negative for dizziness, memory loss, night sweats and weakness.  Hematological: Negative for swollen glands.  Psychiatric/Behavioral: Positive for depressed mood and sleep disturbance. The patient is nervous/anxious.     PMFS History:  Patient Active Problem List   Diagnosis Date Noted  . Encounter for screening for lung cancer 12/29/2019  . Excessive sleepiness 09/08/2014  . Fibromyalgia syndrome 08/25/2014  . RA (rheumatoid arthritis) (HCC)   . GERD (gastroesophageal reflux disease)   . Anxiety   . Depression   . History of shingles   . Synovitis of hand   . Cervical dystonia 12/04/2012    Past Medical History:  Diagnosis Date  . Anxiety   . Cervical dystonia   . Depression   . Fibromyalgia   . GERD (gastroesophageal reflux disease)   . History of shingles   . RA (rheumatoid arthritis) (HCC)   . Synovitis of hand     Family History  Problem Relation Age of Onset  . Arthritis Mother   . Heart Problems Father   . Heart disease Father   . Heart disease Paternal Uncle        MI   Past Surgical  History:  Procedure Laterality Date  . TONSILLECTOMY    . 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 Right handed.    Caffeine- 4 cups daily.   Immunization History  Administered Date(s) Administered  . Influenza,inj,Quad PF,6+ Mos 05/25/2018, 04/03/2019, 04/26/2020   . Influenza-Unspecified 06/24/2014, 05/01/2015, 04/10/2016, 04/06/2017  . Moderna Sars-Covid-2 Vaccination 08/29/2019, 09/26/2019, 06/19/2020  . Pneumococcal Conjugate-13 05/01/2015  . Pneumococcal Polysaccharide-23 04/10/2016  . Tdap 09/09/2015     Objective: Vital Signs: BP (!) 161/98 (BP Location: Left Arm, Patient Position: Sitting, Cuff Size: Normal)   Pulse (!) 120   Resp 18   Ht 5\' 6"  (1.676 m)   Wt 175 lb (79.4 kg)   BMI 28.25 kg/m    Physical Exam Vitals and nursing note reviewed.  Constitutional:      Appearance: She is well-developed and well-nourished.  HENT:     Head: Normocephalic and atraumatic.  Eyes:     Extraocular Movements: EOM normal.     Conjunctiva/sclera: Conjunctivae normal.  Cardiovascular:     Rate and Rhythm: Normal rate and regular rhythm.     Pulses: Intact distal pulses.     Heart sounds: Normal heart sounds.  Pulmonary:     Effort: Pulmonary effort is normal.     Breath sounds: Normal breath sounds.  Abdominal:     General: Bowel sounds are normal.     Palpations: Abdomen is soft.  Musculoskeletal:     Cervical back: Normal range of motion.  Lymphadenopathy:     Cervical: No cervical adenopathy.  Skin:    General: Skin is warm and dry.     Capillary Refill: Capillary refill takes less than 2 seconds.  Neurological:     Mental Status: She is alert and oriented to person, place, and time.  Psychiatric:        Mood and Affect: Mood and affect normal.        Behavior: Behavior normal.      Musculoskeletal Exam: C-spine was in good range of motion.  Shoulder joints, elbow joints were in good range of motion with no synovitis.  She had decreased range of motion of bilateral wrist joints.  She had mild right extensor synovitis.  There was no synovitis over wrist joints, MCPs, PIPs or DIPs.  She good range of motion of bilateral hip joints and knee joints without any warmth swelling or effusion.  She had no tenderness over ankle joints.   There was no tenderness over MTPs.  CDAI Exam: CDAI Score: 2.3  Patient Global: 10 mm; Provider Global: 3 mm Swollen: 0 ; Tender: 1  Joint Exam 07/09/2020      Right  Left  Wrist   Tender        Investigation: No additional findings.  Imaging: CT Angio Chest W/Cm &/Or Wo Cm  Result Date: 06/16/2020 CLINICAL DATA:  Shortness of breath, chest pain. EXAM: CT ANGIOGRAPHY CHEST WITH CONTRAST TECHNIQUE: Multidetector CT imaging of the chest was performed using the standard protocol during bolus administration of intravenous contrast. Multiplanar CT image reconstructions and MIPs were obtained to evaluate the vascular anatomy. CONTRAST:  76mL ISOVUE-370 IOPAMIDOL (ISOVUE-370) INJECTION 76% COMPARISON:  December 29, 2019. FINDINGS: Cardiovascular: Satisfactory opacification of the pulmonary arteries to the segmental level. No evidence of pulmonary embolism. Normal heart size. No pericardial effusion. Mediastinum/Nodes: No enlarged mediastinal, hilar, or  axillary lymph nodes. Thyroid gland, trachea, and esophagus demonstrate no significant findings. Lungs/Pleura: No pneumothorax or pleural effusion is noted. Emphysematous disease is noted. Stable 5 mm subpleural nodule is noted laterally in the right upper lobe best seen on image number 51 of series 15. Stable patchy peripheral opacities are noted in both lungs most consistent with chronic interstitial lung disease or scarring. Stable 5 mm subpleural nodule is noted anteriorly in the right middle lobe best seen on image number 86 of series 15. Upper Abdomen: No acute abnormality. Musculoskeletal: No chest wall abnormality. No acute or significant osseous findings. Review of the MIP images confirms the above findings. IMPRESSION: 1. No definite evidence of pulmonary embolus. 2. Stable patchy peripheral opacities are noted in both lungs most consistent with chronic interstitial lung disease or scarring. 3. Stable right lung nodules are noted, the largest  measuring 5 mm. No follow-up needed if patient is low-risk (and has no known or suspected primary neoplasm). Non-contrast chest CT can be considered in 12 months if patient is high-risk. This recommendation follows the consensus statement: Guidelines for Management of Incidental Pulmonary Nodules Detected on CT Images: From the Fleischner Society 2017; Radiology 2017; 284:228-243. 4. Emphysema. Emphysema (ICD10-J43.9). Electronically Signed   By: Marijo Conception M.D.   On: 06/16/2020 09:30    Recent Labs: Lab Results  Component Value Date   WBC 9.8 07/06/2020   HGB 11.6 (L) 07/06/2020   PLT 247 07/06/2020   NA 141 07/06/2020   K 4.3 07/06/2020   CL 106 07/06/2020   CO2 29 07/06/2020   GLUCOSE 121 (H) 07/06/2020   BUN 22 07/06/2020   CREATININE 1.23 (H) 07/06/2020   BILITOT 0.3 07/06/2020   ALKPHOS 71 03/13/2019   AST 16 07/06/2020   ALT 13 07/06/2020   PROT 6.7 07/06/2020   ALBUMIN 3.6 03/13/2019   CALCIUM 9.1 07/06/2020   GFRAA 55 (L) 07/06/2020   QFTBGOLD Negative 09/14/2015   QFTBGOLDPLUS NEGATIVE 03/24/2020    Speciality Comments: Remicade 6mg /kg every 6 weeks 07/13-09/21 Rinvoq 15 mg qd 04/28/20 MTX - dcd due to increase in [place] started 06/21/20   Procedures:  No procedures performed Allergies: Patient has no known allergies.   Assessment / Plan:     Visit Diagnoses: Rheumatoid arthritis of multiple sites with negative rheumatoid factor (HCC) - Remicade 6mg /kg every 6 weeks 07/13-09/21,Rinvoq 15 mg qd started10/27/21MTX-dcd due to increase in Cr,Arava started 06/21/20.  She has been on Rinvoq for 2 months now.  Refill was started on December 20.  She was tearful and complained of pain and swelling in multiple joints.  I did not see any synovitis on examination.  Mild extensor tenosynovitis of the right wrist joint was noted.  She complained of pain and swelling in the right knee joint.  No swelling or synovitis was noted.  I believe the combination of Rinvoq and Jolee Ewing  is working well for her.  I would like to give it more time.  Per her request I will give her a prednisone taper starting at 20 mg and taper by 5 mg every week.  I had detailed discussion with the patient and her husband that her rheumatoid arthritis appears to be responding to the treatment.  I believe most of her symptoms are coming from fibromyalgia.  High risk medication use-she is on Rinvoq 15 mg p.o. daily, Arava 10 mg p.o. daily.  Her most recent labs showed creatinine 1.23 on June 05, 2021.  She has been advised to  avoid all NSAIDs.  She is fully vaccinated against COVID-19 and received a booster.  Her creatinine is elevated.  I would avoid increasing the dose of leflunomide at this point.  Fibromyalgia-she is having a flare of fibromyalgia with generalized pain and discomfort and positive tender points.  She is on baclofen, Cymbalta and tramadol.  We will refer her to integrative therapies.  Staying active and stretching exercises were emphasized.  Primary insomnia-good sleep hygiene was discussed.  Chronic fatigue-most likely due to fibromyalgia and insomnia.  History of gastroesophageal reflux (GERD)  Recurrent UTI  Anxiety and depression-she has underlying anxiety and depression.  She is on Cymbalta.  Orders: Orders Placed This Encounter  Procedures  . Ambulatory referral to Physical Therapy   Meds ordered this encounter  Medications  . DISCONTD: predniSONE (DELTASONE) 5 MG tablet    Sig: Take 4 tabs po qd x 7 days, 3  tabs po qd x 7 days, 2  tabs po qd x 7 days, 1  tab po qd x 7 days, 1/2 tab po qd x 7 days.    Dispense:  74 tablet    Refill:  0  . DISCONTD: leflunomide (ARAVA) 20 MG tablet    Sig: Take 1 tablet (20 mg total) by mouth daily.    Dispense:  90 tablet    Refill:  0  . predniSONE (DELTASONE) 5 MG tablet    Sig: Take 4 tabs po qd x 7 days, 3  tabs po qd x 7 days, 2  tabs po qd x 7 days, 1  tab po qd x 7 days, 1/2 tab po qd x 7 days.    Dispense:  74 tablet     Refill:  0     Follow-Up Instructions: Return in about 4 weeks (around 08/06/2020) for Rheumatoid arthritis.   Bo Merino, MD  Note - This record has been created using Editor, commissioning.  Chart creation errors have been sought, but may not always  have been located. Such creation errors do not reflect on  the standard of medical care.

## 2020-07-07 NOTE — Progress Notes (Signed)
Hemoglobin and hematocrit are borderline low likely due to decrease in renal function.  Creatinine is elevated-1.23 and GFR is low -47.  Please clarify if she has been taking any NSAIDs? Please advise the patient to avoid NSAID use.  She will be coming to the office on 07/10/19 to discuss discontinuing rinvoq and discuss other treatment options at that time.

## 2020-07-08 ENCOUNTER — Ambulatory Visit: Payer: 59 | Admitting: Rheumatology

## 2020-07-09 ENCOUNTER — Encounter: Payer: Self-pay | Admitting: Rheumatology

## 2020-07-09 ENCOUNTER — Telehealth: Payer: Self-pay

## 2020-07-09 ENCOUNTER — Other Ambulatory Visit: Payer: Self-pay

## 2020-07-09 ENCOUNTER — Ambulatory Visit: Payer: 59 | Admitting: Rheumatology

## 2020-07-09 VITALS — BP 161/98 | HR 120 | Resp 18 | Ht 66.0 in | Wt 175.0 lb

## 2020-07-09 DIAGNOSIS — M0609 Rheumatoid arthritis without rheumatoid factor, multiple sites: Secondary | ICD-10-CM

## 2020-07-09 DIAGNOSIS — F32A Depression, unspecified: Secondary | ICD-10-CM

## 2020-07-09 DIAGNOSIS — N390 Urinary tract infection, site not specified: Secondary | ICD-10-CM

## 2020-07-09 DIAGNOSIS — F5101 Primary insomnia: Secondary | ICD-10-CM

## 2020-07-09 DIAGNOSIS — M797 Fibromyalgia: Secondary | ICD-10-CM | POA: Diagnosis not present

## 2020-07-09 DIAGNOSIS — Z79899 Other long term (current) drug therapy: Secondary | ICD-10-CM | POA: Diagnosis not present

## 2020-07-09 DIAGNOSIS — R5382 Chronic fatigue, unspecified: Secondary | ICD-10-CM

## 2020-07-09 DIAGNOSIS — F419 Anxiety disorder, unspecified: Secondary | ICD-10-CM

## 2020-07-09 DIAGNOSIS — Z8719 Personal history of other diseases of the digestive system: Secondary | ICD-10-CM

## 2020-07-09 MED ORDER — PREDNISONE 5 MG PO TABS: ORAL_TABLET | ORAL | 0 refills | Status: DC

## 2020-07-09 MED ORDER — LEFLUNOMIDE 20 MG PO TABS: 20.0000 mg | ORAL_TABLET | Freq: Every day | ORAL | 0 refills | Status: DC

## 2020-07-09 NOTE — Patient Instructions (Signed)
Standing Labs We placed an order today for your standing lab work.   Please have your standing labs drawn in February and every 3 months    If possible, please have your labs drawn 2 weeks prior to your appointment so that the provider can discuss your results at your appointment.  We have open lab daily Monday through Thursday from 8:30-12:30 PM and 1:30-4:30 PM and Friday from 8:30-12:30 PM and 1:30-4:00 PM at the office of Dr. Sederick Jacobsen, [facility].   Please be advised, patients with office appointments requiring lab work will take precedents over walk-in lab work.  If possible, please come for your lab work on Monday and Friday afternoons, as you may experience shorter wait times. The office is located at [address], [address], [place], [place] [address] No appointment is necessary.   Labs are drawn by [facility]. Please bring your co-pay at the time of your lab draw.  You may receive a bill from [facility] for your lab work.  If you wish to have your labs drawn at another location, please call the office 24 hours in advance to send orders.  If you have any questions regarding directions or hours of operation,  please call [phone].   As a reminder, please drink plenty of water prior to coming for your lab work. Thanks!   

## 2020-07-09 NOTE — Telephone Encounter (Signed)
Patient advised to remain on Arava 10 mg 1 tablet by mouth daily.  Repeat CMP in 2 weeks to reassess renal function.  Advised to avoid NSAID use.   Prednisone taper was sent to the pharmacy.  Advised to take pictures of any joint swelling she experiences going forward.  Urgent referral for integrative therapies placed today.   Upcoming appointment with me on 08/06/20.

## 2020-07-09 NOTE — Telephone Encounter (Signed)
Patient called stating she was returning your call.   °

## 2020-07-12 ENCOUNTER — Telehealth: Payer: Self-pay

## 2020-07-12 NOTE — Telephone Encounter (Signed)
Noted and documented in referral.

## 2020-07-12 NOTE — Telephone Encounter (Signed)
Trudy from [facility] called to let Dr. Estanislado Pandy know that patient is schedule for today, 07/12/20 at 1:00 pm.

## 2020-07-14 ENCOUNTER — Telehealth: Payer: Self-pay | Admitting: Physician Assistant

## 2020-07-14 NOTE — Telephone Encounter (Signed)
I attempted to call the patient to check on how she is doing.  I also wanted to clarify if she has heard about an upcoming appointment at [facility].  She was advised to call me back at the office when she is available.   Courtney Sams, PA-C

## 2020-07-23 NOTE — Progress Notes (Signed)
Office Visit Note  Patient: Courtney Myers             Date of Birth: 1960-10-10           MRN: [id number]             PCP: Susy Frizzle, MD Referring: Susy Frizzle, MD Visit Date: 08/06/2020 Occupation: @GUAROCC @  Subjective:  Medication monitoring   History of Present Illness: Courtney Myers is a 60 y.o. female with history of seropositive rheumatoid arthritis and fibromyalgia.  Patient is taking Rinvoq 15 mg 1 tablet by mouth daily and Arava 10 mg 1 tablet by mouth daily.  She has been tolerating Rinvoq and Arava without any side effects.  She has been taking the prednisone taper as prescribed and will be starting prednisone 5 mg half tablet today for the next 7 days.  Patient reports that she has noticed a significant improvement on combination therapy.  She states her joint pain and swelling has subsided.  She has been wearing arthritis compression gloves as needed which alleviates some of her discomfort and stiffness.  She states that she is also been having less severe and frequent fibromyalgia flares.  She has been going to integrative therapies on a weekly basis which has been alleviating some of her myalgias and muscle tenderness.  She is also started to stretch throughout the day and get up every hour at work to walk which is helped with her stiffness and muscle tension.  She takes baclofen and tramadol very sparingly for symptomatic relief. She denies any recent infections.    Activities of Daily Living:  Patient reports morning stiffness for 0 minutes.   Patient Reports nocturnal pain.  Difficulty dressing/grooming: Denies Difficulty climbing stairs: Reports Difficulty getting out of chair: Reports Difficulty using hands for taps, buttons, cutlery, and/or writing: Denies  Review of Systems  Constitutional: Positive for fatigue.  HENT: Positive for mouth sores, mouth dryness and nose dryness.   Eyes: Negative for pain, itching and dryness.   Respiratory: Positive for shortness of breath. Negative for difficulty breathing.   Cardiovascular: Negative for chest pain and palpitations.  Gastrointestinal: Negative for blood in stool, constipation and diarrhea.  Endocrine: Negative for increased urination.  Genitourinary: Positive for difficulty urinating.  Musculoskeletal: Positive for myalgias, muscle tenderness and myalgias. Negative for arthralgias, joint pain, joint swelling and morning stiffness.  Skin: Negative for color change, rash and redness.  Allergic/Immunologic: Positive for susceptible to infections.  Neurological: Positive for weakness. Negative for dizziness, numbness, headaches and memory loss.  Hematological: Negative for bruising/bleeding tendency.  Psychiatric/Behavioral: Negative for confusion.    PMFS History:  Patient Active Problem List   Diagnosis Date Noted  . Encounter for screening for lung cancer 12/29/2019  . Excessive sleepiness 09/08/2014  . Fibromyalgia syndrome 08/25/2014  . RA (rheumatoid arthritis) ([place])   . GERD (gastroesophageal reflux disease)   . Anxiety   . Depression   . History of shingles   . Synovitis of hand   . Cervical dystonia 12/04/2012    Past Medical History:  Diagnosis Date  . Anxiety   . Cervical dystonia   . Depression   . Fibromyalgia   . GERD (gastroesophageal reflux disease)   . History of shingles   . RA (rheumatoid arthritis) ([place])   . Synovitis of hand     Family History  Problem Relation Age of Onset  . Arthritis Mother   . Heart Problems Father   . Heart disease Father   .  Heart disease Paternal Uncle        MI   Past Surgical History:  Procedure Laterality Date  . TONSILLECTOMY    . 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 Right handed.    Caffeine- 4 cups daily.   Immunization History   Administered Date(s) Administered  . Influenza,inj,Quad PF,6+ Mos 05/25/2018, 04/03/2019, 04/26/2020  . Influenza-Unspecified 06/24/2014, 05/01/2015, 04/10/2016, 04/06/2017  . Moderna Sars-Covid-2 Vaccination 08/29/2019, 09/26/2019, 06/19/2020  . Pneumococcal Conjugate-13 05/01/2015  . Pneumococcal Polysaccharide-23 04/10/2016  . Tdap 09/09/2015     Objective: Vital Signs: BP 126/78 (BP Location: Left Arm, Patient Position: Sitting, Cuff Size: Normal)   Pulse 93   Resp 14   Ht 5\' 7"  (1.702 m)   Wt 176 lb 12.8 oz (80.2 kg)   BMI 27.69 kg/m    Physical Exam Vitals and nursing note reviewed.  Constitutional:      Appearance: She is well-developed and well-nourished.  HENT:     Head: Normocephalic and atraumatic.  Eyes:     Extraocular Movements: EOM normal.     Conjunctiva/sclera: Conjunctivae normal.  Cardiovascular:     Pulses: Intact distal pulses.  Pulmonary:     Effort: Pulmonary effort is normal.  Abdominal:     Palpations: Abdomen is soft.  Musculoskeletal:     Cervical back: Normal range of motion.  Lymphadenopathy:     Cervical: No cervical adenopathy.  Skin:    General: Skin is warm and dry.     Capillary Refill: Capillary refill takes less than 2 seconds.  Neurological:     Mental Status: She is alert and oriented to person, place, and time.  Psychiatric:        Mood and Affect: Mood and affect normal.        Behavior: Behavior normal.      Musculoskeletal Exam: C-spine, thoracic spine, lumbar spine good range of motion with no discomfort.  Trapezius muscle tension and muscle tenderness bilaterally.  Shoulder joints, elbow joints, wrist joints, MCPs, PIPs, DIPs have good range of motion with no synovitis.  She is able to make a complete fist bilaterally.  Hip joints have good range of motion with no discomfort.  Tenderness over the right trochanteric bursa.  Knee joints good ROM with no warmth or effusion.  Ankle joints good ROM with no tenderness or  inflammation.   CDAI Exam: CDAI Score: 0.6  Patient Global: 3 mm; Provider Global: 3 mm Swollen: 0 ; Tender: 0  Joint Exam 08/06/2020   No joint exam has been documented for this visit   There is currently no information documented on the homunculus. Go to the Rheumatology activity and complete the homunculus joint exam.  Investigation: No additional findings.  Imaging: No results found.  Recent Labs: Lab Results  Component Value Date   WBC 9.8 08/02/2020   HGB 12.0 08/02/2020   PLT 247 08/02/2020   NA 140 08/02/2020   K 4.0 08/02/2020   CL 104 08/02/2020   CO2 29 08/02/2020   GLUCOSE 88 08/02/2020   BUN 16 08/02/2020   CREATININE 1.14 (H) 08/02/2020   BILITOT 0.4 08/02/2020   ALKPHOS 71 03/13/2019   AST 19 08/02/2020   ALT 16 08/02/2020   PROT 6.8 08/02/2020   ALBUMIN 3.6 03/13/2019   CALCIUM 9.3 08/02/2020   GFRAA 60 08/02/2020   QFTBGOLD  Negative 09/14/2015   QFTBGOLDPLUS NEGATIVE 03/24/2020    Speciality Comments: Remicade 6mg /kg every 6 weeks 07/13-09/21 Rinvoq 15 mg qd 04/28/20 MTX - dcd due to increase in [place] started 06/21/20   Procedures:  No procedures performed Allergies: Patient has no known allergies.   Assessment / Plan:     Visit Diagnoses: Rheumatoid arthritis of multiple sites with negative rheumatoid factor ([place]) - She has no joint tenderness or synovitis on exam.  She has not had any rheumatoid arthritis flares since her last office visit on 07/09/2020.  She has been taking Rinvoq 15 mg 1 tablet by mouth daily and Arava 10 mg 1 tablet by mouth daily.  She has been tolerating both medications without any side effects and has not missed any doses recently.  She has been taking the prednisone taper prescribed at her last office visit and will be reducing to 2.5 mg daily for the next 1 week and then she will discontinue.  She has noticed a significant improvement in her joint pain and inflammation on combination therapy.  She is also been  experiencing less frequent fibromyalgia flares since starting at integrative therapies.  She will continue on her current treatment regimen.  She was advised to notify us if she develops increased joint pain or joint swelling.  She will follow-up in the office in 5 months.  High risk medication use - Rinvoq 15 mg 1 tablet by mouth daily (started 04/28/20) and Arava 10 mg 1 tablet by mouth daily (started 06/21/20).  Inadequate response to Remicade 6mg /kg every 6 weeks 07/13-09/21. MTX-dcd due to increase in Cr and low GFR.   CBC and CMP were drawn on 08/02/2020.  We will recheck lab work in 1 month and every 3 months to monitor for drug toxicity.  TB gold was negative on 03/24/2020 and will continue to be monitored yearly.  Lipid panel was updated on 06/14/2020.  She was started on Crestor 1 mg 1 tablet by mouth daily.  Discussed the black box warning for jak inhibitors including increased risk for MACE and malignancy.  Discussed the importance of reducing modifiable risk factors. Association of heart disease with rheumatoid arthritis was discussed. Need to monitor blood pressure, cholesterol, and to exercise 30-60 minutes on daily basis was discussed.  She has not had any recent infections.  We discussed the importance of holding Rinvoq and Arava if she develops signs or symptoms of an infection and to resume once the infection has completely cleared.  She has received 3 moderna COVID-19 vaccinations.  Fibromyalgia - She has been experiencing less frequent and less severe fibromyalgia flares over the past 1 month.  She has started going to negative therapies several days a week which has alleviated some of her myalgias and muscle tenderness.  She has also been stretching on a daily basis and getting up every hour while at work.  Her energy level has been stable.  She continues to have interrupted sleep at night.  We discussed the importance of regular exercise and good sleep hygiene.  She will continue to take  Cymbalta 60 mg 1 capsule by mouth daily.  She has been taking baclofen and tramadol very sparingly during flares.   Chronic fatigue: Chronic but stable.  Her energy level has improved slightly since starting to go to integrative therapies and performing stretching exercises on a daily.  We discussed the importance of regular exercise.  Primary insomnia: She continues to have difficulty sleeping at night due to interrupted sleep. She  takes zzzquil pure with lavendar, melatonin, and valerian root as needed. Discussed the importance of good sleep hygiene.   Other medical conditions are listed as follows:   History of gastroesophageal reflux (GERD)  Anxiety and depression -   She is taking cymbalta 60 mg 1 capsule by mouth daily.   Recurrent UTI: Resolved   Orders: No orders of the defined types were placed in this encounter.  No orders of the defined types were placed in this encounter.    Follow-Up Instructions: Return in about 3 months (around 11/03/2020) for Rheumatoid arthritis, Fibromyalgia.   Ofilia Neas, PA-C  Note - This record has been created using Dragon software.  Chart creation errors have been sought, but may not always  have been located. Such creation errors do not reflect on  the standard of medical care.

## 2020-07-28 ENCOUNTER — Telehealth: Payer: Self-pay | Admitting: Neurology

## 2020-07-28 NOTE — Telephone Encounter (Signed)
Courtney Myers from [employer] [employer]. states that they need an ICD.10 code for DYSPORT 500 units SOLR injection. Please advise.  Best contact: [phone]

## 2020-07-29 NOTE — Telephone Encounter (Signed)
I returned [facility]'s call. I spoke with Waunita Schooner and provided him with G24.3 for the Dysport.

## 2020-08-02 ENCOUNTER — Other Ambulatory Visit: Payer: Self-pay | Admitting: *Deleted

## 2020-08-02 DIAGNOSIS — Z79899 Other long term (current) drug therapy: Secondary | ICD-10-CM

## 2020-08-03 LAB — CBC WITH DIFFERENTIAL/PLATELET
Absolute Monocytes: 637 cells/uL (ref 200–950)
Basophils Absolute: 49 cells/uL (ref 0–200)
Basophils Relative: 0.5 %
Eosinophils Absolute: 39 cells/uL (ref 15–500)
Eosinophils Relative: 0.4 %
HCT: 36 % (ref 35.0–45.0)
Hemoglobin: 12 g/dL (ref 11.7–15.5)
Lymphs Abs: 2626 cells/uL (ref 850–3900)
MCH: 29.8 pg (ref 27.0–33.0)
MCHC: 33.3 g/dL (ref 32.0–36.0)
MCV: 89.3 fL (ref 80.0–100.0)
MPV: 11.5 fL (ref 7.5–12.5)
Monocytes Relative: 6.5 %
Neutro Abs: 6448 cells/uL (ref 1500–7800)
Neutrophils Relative %: 65.8 %
Platelets: 247 10*3/uL (ref 140–400)
RBC: 4.03 10*6/uL (ref 3.80–5.10)
RDW: 12.7 % (ref 11.0–15.0)
Total Lymphocyte: 26.8 %
WBC: 9.8 10*3/uL (ref 3.8–10.8)

## 2020-08-03 LAB — COMPLETE METABOLIC PANEL WITH GFR
AG Ratio: 1.3 (calc) (ref 1.0–2.5)
ALT: 16 U/L (ref 6–29)
AST: 19 U/L (ref 10–35)
Albumin: 3.9 g/dL (ref 3.6–5.1)
Alkaline phosphatase (APISO): 74 U/L (ref 37–153)
BUN/Creatinine Ratio: 14 (calc) (ref 6–22)
BUN: 16 mg/dL (ref 7–25)
CO2: 29 mmol/L (ref 20–32)
Calcium: 9.3 mg/dL (ref 8.6–10.4)
Chloride: 104 mmol/L (ref 98–110)
Creat: 1.14 mg/dL — ABNORMAL HIGH (ref 0.50–0.99)
GFR, Est African American: 60 mL/min/{1.73_m2} (ref >=60)
GFR, Est Non African American: 52 mL/min/{1.73_m2} — ABNORMAL LOW (ref >=60)
Globulin: 2.9 g/dL (calc) (ref 1.9–3.7)
Glucose, Bld: 88 mg/dL (ref 65–99)
Potassium: 4 mmol/L (ref 3.5–5.3)
Sodium: 140 mmol/L (ref 135–146)
Total Bilirubin: 0.4 mg/dL (ref 0.2–1.2)
Total Protein: 6.8 g/dL (ref 6.1–8.1)

## 2020-08-03 NOTE — Progress Notes (Signed)
Creatinine remains slightly elevated-1.14.  GFR is slightly low-52 but improving. Please encourage the patient to continue to avoid NSAIDs.   CBC WNL.

## 2020-08-04 ENCOUNTER — Ambulatory Visit: Payer: 59 | Admitting: Rheumatology

## 2020-08-06 ENCOUNTER — Other Ambulatory Visit: Payer: Self-pay

## 2020-08-06 ENCOUNTER — Ambulatory Visit: Payer: 59 | Admitting: Physician Assistant

## 2020-08-06 ENCOUNTER — Encounter: Payer: Self-pay | Admitting: Physician Assistant

## 2020-08-06 VITALS — BP 126/78 | HR 93 | Resp 14 | Ht 67.0 in | Wt 176.8 lb

## 2020-08-06 DIAGNOSIS — R5382 Chronic fatigue, unspecified: Secondary | ICD-10-CM

## 2020-08-06 DIAGNOSIS — M0609 Rheumatoid arthritis without rheumatoid factor, multiple sites: Secondary | ICD-10-CM | POA: Diagnosis not present

## 2020-08-06 DIAGNOSIS — Z79899 Other long term (current) drug therapy: Secondary | ICD-10-CM | POA: Diagnosis not present

## 2020-08-06 DIAGNOSIS — M797 Fibromyalgia: Secondary | ICD-10-CM

## 2020-08-06 DIAGNOSIS — Z8719 Personal history of other diseases of the digestive system: Secondary | ICD-10-CM

## 2020-08-06 DIAGNOSIS — F32A Depression, unspecified: Secondary | ICD-10-CM

## 2020-08-06 DIAGNOSIS — N390 Urinary tract infection, site not specified: Secondary | ICD-10-CM

## 2020-08-06 DIAGNOSIS — F419 Anxiety disorder, unspecified: Secondary | ICD-10-CM

## 2020-08-06 DIAGNOSIS — F5101 Primary insomnia: Secondary | ICD-10-CM

## 2020-08-06 NOTE — Patient Instructions (Addendum)
Standing Labs We placed an order today for your standing lab work.   Please have your standing labs drawn in March   If possible, please have your labs drawn 2 weeks prior to your appointment so that the provider can discuss your results at your appointment.  We have open lab daily Monday through Thursday from 8:30-12:30 PM and 1:30-4:30 PM and Friday from 8:30-12:30 PM and 1:30-4:00 PM at the office of Dr. Bo Merino, [facility].   Please be advised, all patients with office appointments requiring lab work will take precedents over walk-in lab work.  If possible, please come for your lab work on Monday and Friday afternoons, as you may experience shorter wait times. The office is located at [address], [place], [place], [place] [address] No appointment is necessary.   Labs are drawn by [facility]. Please bring your co-pay at the time of your lab draw.  You may receive a bill from [facility] for your lab work.  If you wish to have your labs drawn at another location, please call the office 24 hours in advance to send orders.  If you have any questions regarding directions or hours of operation,  please call [phone].   As a reminder, please drink plenty of water prior to coming for your lab work. Thanks!     Hip Bursitis Rehab Ask your health care provider which exercises are safe for you. Do exercises exactly as told by your health care provider and adjust them as directed. It is normal to feel mild stretching, pulling, tightness, or discomfort as you do these exercises. Stop right away if you feel sudden pain or your pain gets worse. Do not begin these exercises until told by your health care provider. Stretching exercise This exercise warms up your muscles and joints and improves the movement and flexibility of your hip. This exercise also helps to relieve pain and stiffness. Iliotibial band stretch An iliotibial band is a strong band of muscle tissue that runs  from the outer side of your hip to the outer side of your thigh and knee. 1. Lie on your side with your left / right leg in the top position. 2. Bend your left / right knee and grab your ankle. Stretch out your bottom arm to help you balance. 3. Slowly bring your knee back so your thigh is behind your body. 4. Slowly lower your knee toward the floor until you feel a gentle stretch on the outside of your left / right thigh. If you do not feel a stretch and your knee will not fall farther, place the heel of your other foot on top of your knee and pull your knee down toward the floor with your foot. 5. Hold this position for __________ seconds. 6. Slowly return to the starting position. Repeat __________ times. Complete this exercise __________ times a day.   Strengthening exercises These exercises build strength and endurance in your hip and pelvis. Endurance is the ability to use your muscles for a long time, even after they get tired. Bridge This exercise strengthens the muscles that move your thigh backward (hip extensors). 1. Lie on your back on a firm surface with your knees bent and your feet flat on the floor. 2. Tighten your buttocks muscles and lift your buttocks off the floor until your trunk is level with your thighs. ? Do not arch your back. ? You should feel the muscles working in your buttocks and the back of your thighs. If  you do not feel these muscles, slide your feet 1-2 inches (2.5-5 cm) farther away from your buttocks. ? If this exercise is too easy, try doing it with your arms crossed over your chest. 3. Hold this position for __________ seconds. 4. Slowly lower your hips to the starting position. 5. Let your muscles relax completely after each repetition. Repeat __________ times. Complete this exercise __________ times a day.   Squats This exercise strengthens the muscles in front of your thigh and knee (quadriceps). 1. Stand in front of a table, with your feet and knees  pointing straight ahead. You may rest your hands on the table for balance but not for support. 2. Slowly bend your knees and lower your hips like you are going to sit in a chair. ? Keep your weight over your heels, not over your toes. ? Keep your lower legs upright so they are parallel with the table legs. ? Do not let your hips go lower than your knees. ? Do not bend lower than told by your health care provider. ? If your hip pain increases, do not bend as low. 3. Hold the squat position for __________ seconds. 4. Slowly push with your legs to return to standing. Do not use your hands to pull yourself to standing. Repeat __________ times. Complete this exercise __________ times a day. Hip hike 1. Stand sideways on a bottom step. Stand on your left / right leg with your other foot unsupported next to the step. You can hold on to the railing or wall for balance if needed. 2. Keep your knees straight and your torso square. Then lift your left / right hip up toward the ceiling. 3. Hold this position for __________ seconds. 4. Slowly let your left / right hip lower toward the floor, past the starting position. Your foot should get closer to the floor. Do not lean or bend your knees. Repeat __________ times. Complete this exercise __________ times a day. Single leg stand 1. Without shoes, stand near a railing or in a doorway. You may hold on to the railing or door frame as needed for balance. 2. Squeeze your left / right buttock muscles, then lift up your other foot. ? Do not let your left / right hip push out to the side. ? It is helpful to stand in front of a mirror for this exercise so you can watch your hip. 3. Hold this position for __________ seconds. Repeat __________ times. Complete this exercise __________ times a day. This information is not intended to replace advice given to you by your health care provider. Make sure you discuss any questions you have with your health care  provider. Document Revised: 10/14/2018 Document Reviewed: 10/14/2018 Elsevier Patient Education  [place].

## 2020-08-17 ENCOUNTER — Other Ambulatory Visit: Payer: Self-pay | Admitting: Physician Assistant

## 2020-08-17 NOTE — Telephone Encounter (Signed)
Last Visit: 08/06/2020 Next Visit: 11/10/2020 Labs: 08/02/2020, Creatinine remains slightly elevated-1.14. GFR is slightly low-52 but improving. Please encourage the patient to continue to avoid NSAIDs.  CBC WNL.   Current Dose per office note 08/06/2020, Arava 10 mg 1 tablet by mouth daily (started 06/21/20).   DX: Rheumatoid arthritis of multiple sites with negative rheumatoid factor   Last Fill: 07/17/2020  Okay to refill Arava?

## 2020-08-18 ENCOUNTER — Other Ambulatory Visit: Payer: Self-pay | Admitting: Physician Assistant

## 2020-08-18 NOTE — Telephone Encounter (Signed)
Last Visit: 08/06/2020 Next Visit: 11/10/2020  Current Dose per office note on 08/06/2020, Cymbalta 60 mg 1 capsule by mouth daily Dx: Fibromyalgia  Last Fill: 05/18/2020  Okay to refill Cymbalta?

## 2020-08-25 ENCOUNTER — Ambulatory Visit: Payer: 59 | Admitting: Neurology

## 2020-09-01 NOTE — Telephone Encounter (Signed)
Patient's Dysport appointment is 3/9. I called [facility] and spoke with Yvone Neu to check the order status. Yvone Neu states that the order is still in processing. I advised that patient's appt 3/9. Yvone Neu states that medical benefit review must be completed and he will be sure to place a note on the account with the patient's appointment date.

## 2020-09-02 NOTE — Telephone Encounter (Signed)
I called [facility] today and spoke with Waunita Schooner to check status. Waunita Schooner states patient's responsibility is $0.00. He states Dysport TBD 3/8.

## 2020-09-07 NOTE — Telephone Encounter (Signed)
Received (1) 500 unit vial of Dysport from [facility]. Lot #[id number] (Exp. 04/01/21), SN #[id number].

## 2020-09-08 ENCOUNTER — Encounter (HOSPITAL_COMMUNITY): Payer: Self-pay

## 2020-09-08 ENCOUNTER — Emergency Department (HOSPITAL_COMMUNITY): Payer: 59

## 2020-09-08 ENCOUNTER — Other Ambulatory Visit: Payer: Self-pay

## 2020-09-08 ENCOUNTER — Ambulatory Visit: Payer: 59 | Admitting: Neurology

## 2020-09-08 ENCOUNTER — Emergency Department (HOSPITAL_COMMUNITY)
Admission: EM | Admit: 2020-09-08 | Discharge: 2020-09-08 | Disposition: A | Discharge status: Home / Self Care | Payer: 59 | Attending: Emergency Medicine | Admitting: Emergency Medicine

## 2020-09-08 DIAGNOSIS — R091 Pleurisy: Secondary | ICD-10-CM

## 2020-09-08 DIAGNOSIS — F17210 Nicotine dependence, cigarettes, uncomplicated: Secondary | ICD-10-CM | POA: Diagnosis not present

## 2020-09-08 DIAGNOSIS — R0781 Pleurodynia: Secondary | ICD-10-CM | POA: Diagnosis present

## 2020-09-08 DIAGNOSIS — Z7982 Long term (current) use of aspirin: Secondary | ICD-10-CM | POA: Diagnosis not present

## 2020-09-08 DIAGNOSIS — R0682 Tachypnea, not elsewhere classified: Secondary | ICD-10-CM | POA: Insufficient documentation

## 2020-09-08 DIAGNOSIS — J439 Emphysema, unspecified: Secondary | ICD-10-CM | POA: Diagnosis not present

## 2020-09-08 DIAGNOSIS — R000 Tachycardia, unspecified: Secondary | ICD-10-CM | POA: Diagnosis not present

## 2020-09-08 LAB — TROPONIN I (HIGH SENSITIVITY)
Troponin I (High Sensitivity): 8 ng/L (ref <18)
Troponin I (High Sensitivity): 8 ng/L (ref <18)

## 2020-09-08 LAB — BASIC METABOLIC PANEL
Anion gap: 9 (ref 5–15)
BUN: 11 mg/dL (ref 8–23)
CO2: 25 mmol/L (ref 22–32)
Calcium: 9.1 mg/dL (ref 8.9–10.3)
Chloride: 102 mmol/L (ref 98–111)
Creatinine, Ser: 1.03 mg/dL — ABNORMAL HIGH (ref 0.44–1.00)
GFR, Estimated: >60 mL/min (ref >60)
Glucose, Bld: 118 mg/dL — ABNORMAL HIGH (ref 70–99)
Potassium: 3.6 mmol/L (ref 3.5–5.1)
Sodium: 136 mmol/L (ref 135–145)

## 2020-09-08 LAB — CBC
HCT: 34.1 % — ABNORMAL LOW (ref 36.0–46.0)
Hemoglobin: 11 g/dL — ABNORMAL LOW (ref 12.0–15.0)
MCH: 29.7 pg (ref 26.0–34.0)
MCHC: 32.3 g/dL (ref 30.0–36.0)
MCV: 92.2 fL (ref 80.0–100.0)
Platelets: 284 10*3/uL (ref 150–400)
RBC: 3.7 MIL/uL — ABNORMAL LOW (ref 3.87–5.11)
RDW: 13.8 % (ref 11.5–15.5)
WBC: 13.1 10*3/uL — ABNORMAL HIGH (ref 4.0–10.5)
nRBC: 0 % (ref 0.0–0.2)

## 2020-09-08 MED ORDER — NAPROXEN 500 MG PO TABS
500.0000 mg | ORAL_TABLET | Freq: Two times a day (BID) | ORAL | 0 refills | Status: DC
Start: 2020-09-08 — End: 2020-09-17

## 2020-09-08 MED ORDER — KETOROLAC TROMETHAMINE 30 MG/ML IJ SOLN
15.0000 mg | Freq: Once | INTRAMUSCULAR | Status: AC
  Administered 2020-09-08: 15 mg via INTRAVENOUS
  Filled 2020-09-08: qty 1

## 2020-09-08 MED ORDER — DEXAMETHASONE SODIUM PHOSPHATE 10 MG/ML IJ SOLN
10.0000 mg | Freq: Once | INTRAMUSCULAR | Status: AC
  Administered 2020-09-08: 10 mg via INTRAVENOUS
  Filled 2020-09-08: qty 1

## 2020-09-08 MED ORDER — HYDROCODONE-ACETAMINOPHEN 5-325 MG PO TABS
1.0000 | ORAL_TABLET | Freq: Once | ORAL | Status: AC
  Administered 2020-09-08: 1 via ORAL
  Filled 2020-09-08: qty 1

## 2020-09-08 MED ORDER — HYDROCODONE-ACETAMINOPHEN 5-325 MG PO TABS: 1.0000 | ORAL_TABLET | Freq: Three times a day (TID) | ORAL | 0 refills | Status: AC | PRN

## 2020-09-08 MED ORDER — PREDNISONE 10 MG PO TABS: 50.0000 mg | ORAL_TABLET | Freq: Every day | ORAL | 0 refills | Status: DC

## 2020-09-08 NOTE — ED Provider Notes (Signed)
[facility] EMERGENCY DEPARTMENT Provider Note   CSN: [id number] Arrival date & time: 09/08/20  0544     History Chief Complaint  Patient presents with  . Chest Pain  . Shortness of Breath    Courtney Myers is a 60 y.o. female.  HPI  HPI: A 60 year old patient with a history of hypercholesterolemia presents for evaluation of chest pain. Initial onset of pain was more than 6 hours ago. The patient's chest pain is not worse with exertion. The patient's chest pain is middle- or left-sided, is not well-localized, is not described as heaviness/pressure/tightness, is not sharp and does not radiate to the arms/jaw/neck. The patient does not complain of nausea and denies diaphoresis. The patient has smoked in the past 90 days. The patient has no history of stroke, has no history of peripheral artery disease, denies any history of treated diabetes, has no relevant family history of coronary artery disease (first degree relative at less than age 20), is not hypertensive and does not have an elevated BMI (>=30).  60 year old female with history of rheumatoid arthritis, radiologic evidence of emphysema comes into the ER with chief complaint of chest pain and shortness of breath.  Patient's current chest pain started at 1 PM.  The pain is midsternal and also located in the flank region.  Patient's pain is worse with deep inspiration.  She gets similar episodes of pain about 5-6 times a year.  On occasion she might need to go to an urgent care or ER for it.  The pain is described as sharp and throbbing pain, similar to her prior episodes.  There is no new cough, fevers, chills.    Patient is noted to have O2 sats at about 95% and she is slightly tachypneic.  Pt has no hx of PE, DVT and denies any exogenous hormone (testosterone / estrogen) use, long distance travels or surgery in the past 6 weeks, active cancer, recent immobilization.   Previous CT scan has revealed emphysema.   Patient reports that she has never been told that she has COPD/emphysema.  She currently smokes half a pack a day, but was smoking more earlier.  She has no new cough, fevers, chills.  Patient denies any lower extremity weakness, dizziness.  Past Medical History:  Diagnosis Date  . Anxiety   . Cervical dystonia   . Depression   . Fibromyalgia   . GERD (gastroesophageal reflux disease)   . History of shingles   . RA (rheumatoid arthritis) ([place])   . Synovitis of hand     Patient Active Problem List   Diagnosis Date Noted  . Encounter for screening for lung cancer 12/29/2019  . Excessive sleepiness 09/08/2014  . Fibromyalgia syndrome 08/25/2014  . RA (rheumatoid arthritis) ([place])   . GERD (gastroesophageal reflux disease)   . Anxiety   . Depression   . History of shingles   . Synovitis of hand   . Cervical dystonia 12/04/2012    Past Surgical History:  Procedure Laterality Date  . TONSILLECTOMY    . VAGINAL HYSTERECTOMY  3/08     OB History    Gravida  4   Para  2   Term      Preterm  2   AB  2   Living  2     SAB  1   IAB      Ectopic      Multiple      Live Births  Family History  Problem Relation Age of Onset  . Arthritis Mother   . Heart Problems Father   . Heart disease Father   . Heart disease Paternal Uncle        MI    Social History   Tobacco Use  . Smoking status: Current Every Day Smoker    Packs/day: 1.00    Years: 34.00    Pack years: 34.00    Types: Cigarettes    Start date: 8  . Smokeless tobacco: Never Used  Vaping Use  . Vaping Use: Never used  Substance Use Topics  . Alcohol use: Yes    Alcohol/week: 0.0 standard drinks    Comment: Rare, twice per year  . Drug use: Never    Home Medications Prior to Admission medications   Medication Sig Start Date End Date Taking? Authorizing Provider  HYDROcodone-acetaminophen (NORCO/VICODIN) 5-325 MG tablet Take 1 tablet by mouth every 8 (eight) hours as  needed for up to 3 days for severe pain. 09/08/20 09/11/20 Yes Ellieanna Funderburg, MD  naproxen (NAPROSYN) 500 MG tablet Take 1 tablet (500 mg total) by mouth 2 (two) times daily. 09/08/20  Yes Varney Biles, MD  predniSONE (DELTASONE) 10 MG tablet Take 5 tablets (50 mg total) by mouth daily. 09/09/20  Yes Varney Biles, MD  aspirin EC 81 MG tablet Take 1 tablet (81 mg total) by mouth daily. Swallow whole. 06/16/20   Buford Dresser, MD  baclofen (LIORESAL) 10 MG tablet Take 10 mg by mouth daily as needed for muscle spasms.  05/01/13   [provider]  clonazePAM (KLONOPIN) 0.5 MG tablet Take 1 tablet (0.5 mg total) by mouth 2 (two) times daily as needed. for anxiety 04/20/20   Marcial Pacas, MD  CRANBERRY PO Take 2 capsules by mouth in the morning and at bedtime.     [provider]  DULoxetine (CYMBALTA) 60 MG capsule TAKE 1 CAPSULE(60 MG) BY MOUTH DAILY 08/18/20   Ofilia Neas, PA-C  DYSPORT 500 units SOLR injection INJECT 500 UNITS  INTRAMUSCULARLY EVERY 3  MONTHS (GIVEN AT MD OFFICE, DISCARD UNUSED) 03/16/20   Marcial Pacas, MD  folic acid (FOLVITE) 1 MG tablet TAKE 1 TABLET(1 MG) BY MOUTH DAILY 10/20/19   Ofilia Neas, PA-C  leflunomide (ARAVA) 10 MG tablet Take 1 tablet (10 mg total) by mouth daily. 08/17/20   Bo Merino, MD  Multiple Vitamins-Minerals (MULTIVITAMINS THER. W/MINERALS) TABS Take 1 tablet by mouth at bedtime.     [provider]  omeprazole (PRILOSEC) 20 MG capsule TAKE 1 CAPSULE BY MOUTH  EVERY DAY 05/17/20   Alycia Rossetti, MD  RINVOQ 15 MG TB24 TAKE 1 TABLET BY MOUTH  DAILY 07/05/20   Ofilia Neas, PA-C  rosuvastatin (CRESTOR) 20 MG tablet Take 1 tablet (20 mg total) by mouth daily. 06/16/20 09/14/20  Buford Dresser, MD  traMADol (ULTRAM) 50 MG tablet Take 1 - 2 every 8 hours as needed for pain. 10/22/17   Orlena Sheldon, PA-C  Xylitol (XYLIMELTS MT) Use as directed 2 tablets in the mouth or throat at bedtime as needed (dry mouth).      [provider]    Allergies    Patient has no known allergies.  Review of Systems   Review of Systems  Constitutional: Positive for activity change.  Respiratory: Positive for shortness of breath.   Cardiovascular: Positive for chest pain.  Gastrointestinal: Negative for nausea and vomiting.  Allergic/Immunologic: Positive for immunocompromised state.  Hematological: Does  not bruise/bleed easily.  All other systems reviewed and are negative.   Physical Exam Updated Vital Signs BP 120/65   Pulse (!) 105   Temp 98.4 F (36.9 C) (Oral)   Resp (!) 25   Ht 5\' 6"  (1.676 m)   Wt 79.4 kg   SpO2 92%   BMI 28.25 kg/m   Physical Exam Vitals and nursing note reviewed.  Constitutional:      Appearance: She is well-developed.  HENT:     Head: Normocephalic and atraumatic.  Eyes:     Extraocular Movements: EOM normal.  Cardiovascular:     Rate and Rhythm: Tachycardia present.     Pulses:          Radial pulses are 2+ on the right side and 2+ on the left side.  Pulmonary:     Effort: Tachypnea present.  Abdominal:     General: Bowel sounds are normal.  Musculoskeletal:     Cervical back: Normal range of motion and neck supple.  Skin:    General: Skin is warm and dry.  Neurological:     Mental Status: She is alert and oriented to person, place, and time.     ED Results / Procedures / Treatments   Labs (all labs ordered are listed, but only abnormal results are displayed) Labs Reviewed  BASIC METABOLIC PANEL - Abnormal; Notable for the following components:      Result Value   Glucose, Bld 118 (*)    Creatinine, Ser 1.03 (*)    All other components within normal limits  CBC - Abnormal; Notable for the following components:   WBC 13.1 (*)    RBC 3.70 (*)    Hemoglobin 11.0 (*)    HCT 34.1 (*)    All other components within normal limits  TROPONIN I (HIGH SENSITIVITY)  TROPONIN I (HIGH SENSITIVITY)    EKG EKG Interpretation  Date/Time:  Wednesday  September 08 2020 05:49:24 EST Ventricular Rate:  101 PR Interval:  116 QRS Duration: 80 QT Interval:  356 QTC Calculation: 461 R Axis:   71 Text Interpretation: Sinus tachycardia Right atrial enlargement Borderline ECG No acute changes No significant change since last tracing Confirmed by Varney Biles ([id number]) on 09/08/2020 7:44:46 AM   Radiology DG Chest 2 View  Result Date: 09/08/2020 CLINICAL DATA:  Chest pain. EXAM: CHEST - 2 VIEW COMPARISON:  CT 06/16/2020.  Chest x-ray 05/21/2020. FINDINGS: Mediastinum hilar structures normal. Bibasilar atelectasis and infiltrates. Small left pleural effusion. No pneumothorax. Heart size stable. No acute bony abnormality. Bowel distention cannot be excluded. Abdominal series can be obtained for further evaluation. IMPRESSION: 1. Bibasilar atelectasis and infiltrates. Small left pleural effusion. 2. Bowel distention cannot be excluded. Abdominal series can be obtained to further evaluate. Electronically Signed   By: Marcello Moores  Register   On: 09/08/2020 06:03    Procedures Procedures   Medications Ordered in ED Medications  ketorolac (TORADOL) 30 MG/ML injection 15 mg (15 mg Intravenous Given 09/08/20 0831)  HYDROcodone-acetaminophen (NORCO/VICODIN) 5-325 MG per tablet 1 tablet (1 tablet Oral Given 09/08/20 0831)  dexamethasone (DECADRON) injection 10 mg (10 mg Intravenous Given 09/08/20 8563)    ED Course  I have reviewed the triage vital signs and the nursing notes.  Pertinent labs & imaging results that were available during my care of the patient were reviewed by me and considered in my medical decision making (see chart for details).  Clinical Course as of 09/08/20 0944  Wed Sep 08, 2020  0941  Patient reassessed.  She feels a lot better.  Her pain is now 2 out of 10.  We discussed occult infection is a possibility, but patient denies any new cough, fevers and clinically she does not appear to be pneumonia.  She is quite okay not being started on  antibiotics.  We will give her prednisone for 3 days and Naprosyn and [place].  The latter being for as needed purposes only.  We advised her to return to the ER if she starts having worsening chest pain, new neurologic symptoms, weakness, near fainting.  For now, the plan is for her to follow-up with PCP and the pleurisy is thought to be likely related to her RA. [AN]  1610 DG Chest 2 View X-rays independently reviewed by me.  No mediastinum widening, consolidation, pulmonary edema, pneumothorax.  Nonspecific bowel distention without any GI symptoms to support additional inquiry. [AN]    Clinical Course User Index [AN] Varney Biles, MD   MDM Rules/Calculators/A&P HEAR Score: 2                        60 year old female with history of RA comes in with chief complaint of chest pain and shortness of breath.  Chest pain is pleuritic.  She has radiologic evidence of COPD-emphysema.   It appears that she has had similar symptoms in the past.  The current episode started yesterday.  We considered PE in the differential diagnosis, however she had no risk factors for it and she had a CT angiogram done less than 3 months ago which was negative for it.  I also considered dissection in the differential, however patient had equal radial pulses, no new neurologic symptoms, lower extremity weakness, and with pain being pleuritic in nature -the suspicion for dissection is lower.  Additionally, the CT angiogram PE did not show any clear evidence of dissection which is helpful.  Troponins ordered and they are reassuring.  Patient was given Toradol and Norco.  Plan is to reassess her.  If she is doing better, then we will proceed with discharge.  Otherwise we will consider further discussion on advanced imaging.  Final Clinical Impression(s) / ED Diagnoses Final diagnoses:  Pleurisy  Pulmonary emphysema, unspecified emphysema type ([place])    Rx / DC Orders ED Discharge Orders         Ordered     predniSONE (DELTASONE) 10 MG tablet  Daily        09/08/20 0932    naproxen (NAPROSYN) 500 MG tablet  2 times daily        09/08/20 0932    HYDROcodone-acetaminophen (NORCO/VICODIN) 5-325 MG tablet  Every 8 hours PRN        09/08/20 0932           Varney Biles, MD 09/08/20 [id number]

## 2020-09-09 ENCOUNTER — Encounter: Payer: Self-pay | Admitting: *Deleted

## 2020-09-09 ENCOUNTER — Telehealth: Payer: Self-pay

## 2020-09-09 MED ORDER — PREDNISONE 5 MG PO TABS
ORAL_TABLET | ORAL | 1 refills | Status: DC
Start: 2020-09-09 — End: 2020-09-17

## 2020-09-09 NOTE — Telephone Encounter (Signed)
Pain order lab to give prednisone standing orders.  We can send a 1 course of prednisone taper starting at prednisone 20 mg and taper by 5 mg every 2 days.  Once she finishes that then we can give another taper for the future.  Long-term use of prednisone causes weight gain, increased cholesterol, increased risk of heart disease, diabetes, and osteoporosis.

## 2020-09-09 NOTE — Telephone Encounter (Addendum)
Transition Care Management Follow-up Telephone Call  Date of discharge and from where: 09/08/2020 from [facility]  How have you been since you were released from the hospital? Pt states that she is feeling much better now and has no questions or concerns.   Any questions or concerns? No  Items Reviewed:  Did the pt receive and understand the discharge instructions provided? Yes   Medications obtained and verified? Yes   Other? No   Any new allergies since your discharge? No   Dietary orders reviewed? n/a  Do you have support at home? Yes    Functional Questionnaire: (I = Independent and D = Dependent) ADLs: I  Bathing/Dressing- I  Meal Prep- I  Eating- I  Maintaining continence- I  Transferring/Ambulation- I  Managing Meds- I   Follow up appointments reviewed:   PCP Hospital f/u appt confirmed? No    Specialist Hospital f/u appt confirmed? Yes  Scheduled to see Marcial Pacas, MD on 09/16/2020 @ 09:00am.  Are transportation arrangements needed? No   If their condition worsens, is the pt aware to call PCP or go to the Emergency Dept.? Yes  Was the patient provided with contact information for the PCP's office or ED? Yes  Was to pt encouraged to call back with questions or concerns? Yes

## 2020-09-13 ENCOUNTER — Encounter: Payer: Self-pay | Admitting: Family Medicine

## 2020-09-13 ENCOUNTER — Other Ambulatory Visit: Payer: Self-pay

## 2020-09-13 ENCOUNTER — Telehealth: Payer: Self-pay | Admitting: Radiology

## 2020-09-13 ENCOUNTER — Ambulatory Visit: Payer: 59 | Admitting: Family Medicine

## 2020-09-13 VITALS — BP 130/80 | HR 94 | Temp 98.5°F | Ht 67.0 in | Wt 176.0 lb

## 2020-09-13 DIAGNOSIS — M069 Rheumatoid arthritis, unspecified: Secondary | ICD-10-CM

## 2020-09-13 DIAGNOSIS — M940 Chondrocostal junction syndrome [Tietze]: Secondary | ICD-10-CM | POA: Diagnosis not present

## 2020-09-13 MED ORDER — PREDNISONE 5 MG PO TABS: 10.0000 mg | ORAL_TABLET | Freq: Every day | ORAL | 1 refills | Status: DC

## 2020-09-13 NOTE — Progress Notes (Signed)
Subjective:    Patient ID: Courtney Myers, female    DOB: June 14, 1961, 60 y.o.   MRN: [id number]  HPI  12/21 Patient is a very pleasant 60 year old Caucasian female who presents today for follow-up of recent ER visit.  She was seen in the emergency room for chest pain.  The pain began suddenly.  It was substernal in location.  It was intense.  She describes it as a 10 out of 10.  However she was having pleurisy associated with it.  The pain was reproducible with palpation on the chest wall.  She believed it was due to costochondritis and it responded dramatically to morphine.  EKG, chest x-ray, troponins x2 were negative in the emergency room.  The only concerning finding was an elevated white blood cell count of 15.  However patient is concerned about her long-term risk for cardiac disease.  She certainly has risk factors.  The patient smokes.  She has a history of coronary artery disease.  Her father had numerous stents and bypasses starting in his 47s.  She had a paternal uncle who had stents in his 30s.  Her paternal grandfather also had stents and bypasses.  Therefore coronary artery disease is strong and the female members on her father side of the family usually in the late 5s or early 16s.  The patient also has an inflammatory condition with rheumatoid arthritis that would put her at increased cardiac risk.  I do not believe that the chest pain she was experiencing in November was for heart however based on these risk factors I do believe she is at high risk moving forward.  She is not had a cholesterol in some time.  She is not on a statin.  At that time, my plan was: Obviously smoking cessation would benefit the patient regardless and we discussed this today.  However, I would like to set her up to see a cardiologist for risk stratification with a coronary artery CT scan/calcium score.  If elevated, the patient would benefit from a high-dose statin.  Risk factors include age, family  history of coronary artery disease, a pro inflammatory condition such as rheumatoid arthritis, and age greater than 25.  Therefore I will schedule the patient to see cardiology.  Also given the severity of the pain and the elevated white blood cell count I would like to check a D-dimer as the patient is still having some shortness of breath with activity and even while talking.  If positive, the patient may require a CT scan to rule out PE.  09/13/20 Patient presents today for follow-up from recent ER visit.  Was seen in the emergency room on March 9 with severe left-sided pleurisy.  She states that it hurts to simply take a breath in.  She points to the junction of the sternum and the ribs.  Chest x-ray was normal other than bibasilar atelectasis versus infiltrates.  Labs were normal.  I had previously done a CT scan of her lungs in December which showed possible interstitial lung disease but otherwise no PE.  Patient was placed on prednisone and states that she was "a new person" with no chest pain while she was taking the prednisone.  However as soon as the prednisone stopped, the pain came back and is now a 7 on a scale of 1-10.  She states that it hurts to take a deep breath then.  The pain radiates from the costochondral junction on the left-hand side into her posterior left  ribs.  She has very little cough.  She denies any hemoptysis.  She denies any fever.  Past Medical History:  Diagnosis Date  . Anxiety   . Cervical dystonia   . Depression   . Fibromyalgia   . GERD (gastroesophageal reflux disease)   . History of shingles   . RA (rheumatoid arthritis) ([place])   . Synovitis of hand    Past Surgical History:  Procedure Laterality Date  . TONSILLECTOMY    . VAGINAL HYSTERECTOMY  3/08   Current Outpatient Medications on File Prior to Visit  Medication Sig Dispense Refill  . aspirin EC 81 MG tablet Take 1 tablet (81 mg total) by mouth daily. Swallow whole. 90 tablet 3  . baclofen (LIORESAL)  10 MG tablet Take 10 mg by mouth daily as needed for muscle spasms.     . clonazePAM (KLONOPIN) 0.5 MG tablet Take 1 tablet (0.5 mg total) by mouth 2 (two) times daily as needed. for anxiety 60 tablet 5  . CRANBERRY PO Take 2 capsules by mouth in the morning and at bedtime.     . DULoxetine (CYMBALTA) 60 MG capsule TAKE 1 CAPSULE(60 MG) BY MOUTH DAILY 30 capsule 2  . DYSPORT 500 units SOLR injection INJECT 500 UNITS  INTRAMUSCULARLY EVERY 3  MONTHS (GIVEN AT MD OFFICE, DISCARD UNUSED) 1 each 3  . folic acid (FOLVITE) 1 MG tablet TAKE 1 TABLET(1 MG) BY MOUTH DAILY 90 tablet 4  . leflunomide (ARAVA) 10 MG tablet Take 1 tablet (10 mg total) by mouth daily. 90 tablet 0  . Multiple Vitamins-Minerals (MULTIVITAMINS THER. W/MINERALS) TABS Take 1 tablet by mouth at bedtime.     . naproxen (NAPROSYN) 500 MG tablet Take 1 tablet (500 mg total) by mouth 2 (two) times daily. 20 tablet 0  . omeprazole (PRILOSEC) 20 MG capsule TAKE 1 CAPSULE BY MOUTH  EVERY DAY 90 capsule 3  . predniSONE (DELTASONE) 10 MG tablet Take 5 tablets (50 mg total) by mouth daily. 15 tablet 0  . predniSONE (DELTASONE) 5 MG tablet Take 4 tabs po x 2 days, 3  tabs po x 2 days, 2  tabs po x 2 days, 1  tab po x 2 days 20 tablet 1  . RINVOQ 15 MG TB24 TAKE 1 TABLET BY MOUTH  DAILY 90 tablet 0  . rosuvastatin (CRESTOR) 20 MG tablet Take 1 tablet (20 mg total) by mouth daily. 90 tablet 3  . traMADol (ULTRAM) 50 MG tablet Take 1 - 2 every 8 hours as needed for pain. 30 tablet 0  . Xylitol (XYLIMELTS MT) Use as directed 2 tablets in the mouth or throat at bedtime as needed (dry mouth).      No current facility-administered medications on file prior to visit.   No Known Allergies Social History   Socioeconomic History  . Marital status: Married    Spouse name: [name]  . Number of children: 2  . Years of education: 78  . Highest education level: Not on file  Occupational History    Employer: [employer]  Tobacco Use  . Smoking  status: Current Every Day Smoker    Packs/day: 1.00    Years: 34.00    Pack years: 34.00    Types: Cigarettes    Start date: 24  . Smokeless tobacco: Never Used  Vaping Use  . Vaping Use: Never used  Substance and Sexual Activity  . Alcohol use: Yes    Alcohol/week: 0.0 standard drinks    Comment:  Rare, twice per year  . Drug use: Never  . Sexual activity: Yes    Birth control/protection: Surgical    Comment: 1st intercourse 60 yo-Fewer than 5 partners  Other Topics Concern  . 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 Right handed.    Caffeine- 4 cups daily.   Social Determinants of Health   Financial Resource Strain: Not on file  Food Insecurity: Not on file  Transportation Needs: Not on file  Physical Activity: Not on file  Stress: Not on file  Social Connections: Not on file  Intimate Partner Violence: Not on file     Review of Systems  All other systems reviewed and are negative.      Objective:   Physical Exam Vitals reviewed.  Constitutional:      Appearance: Normal appearance.  Cardiovascular:     Rate and Rhythm: Normal rate and regular rhythm.     Heart sounds: Normal heart sounds.  Pulmonary:     Effort: Pulmonary effort is normal.     Breath sounds: Normal breath sounds. No wheezing, rhonchi or rales.  Chest:     Chest wall: Tenderness present. No crepitus.    Neurological:     Mental Status: She is alert.           Assessment & Plan:  Rheumatoid arthritis involving multiple sites, unspecified whether rheumatoid factor present ([place])  Costochondritis  I believe the patient has costochondritis.  I believe this is also likely a manifestation of her RA.  I will start the patient on prednisone 10 mg a day and gradually try to wean her down as quickly as possible and hopefully off of the prednisone.  Meanwhile I will  expedite her referral back to her rheumatologist to determine if there are other options for DMARDS

## 2020-09-13 NOTE — Telephone Encounter (Signed)
Received incoming referral from Dr. Dennard Schaumann noting referral was placed to get patient in sooner for follow-up with Dr. Estanislado Pandy. Sent message to Dr. Dennard Schaumann asking for clarification- assuming patient would like to get in sooner than 11/10/2020, unsure of reason, etc.

## 2020-09-15 ENCOUNTER — Other Ambulatory Visit: Payer: Self-pay | Admitting: Neurology

## 2020-09-15 NOTE — Progress Notes (Signed)
Office Visit Note  Patient: Courtney Myers             Date of Birth: [birth year]           MRN: [id number]             PCP: Susy Frizzle, MD Referring: Susy Frizzle, MD Visit Date: 09/17/2020 Occupation: @GUAROCC @  Subjective:  Chest pain.   History of Present Illness: Courtney Myers is a 60 y.o. female with a history of seronegative rheumatoid arthritis.  She states that her rheumatoid arthritis is very well controlled on Rinvoq and Arava combination.  She has not experienced any joint swelling.  She states her fibromyalgia symptoms are quite as well.  She recalls in November 2021 she was seen in the emergency room due to chest pain.  She was given pain medications and discharged.  She was seen again in the ER on 03/2021 with the symptoms of pleurisy.  Activities of Daily Living:  Patient reports morning stiffness for 0 minutes.   Patient Reports nocturnal pain.  Difficulty dressing/grooming: Denies Difficulty climbing stairs: Reports Difficulty getting out of chair: Reports Difficulty using hands for taps, buttons, cutlery, and/or writing: Denies  Review of Systems  Constitutional: Positive for fatigue.  HENT: Positive for mouth dryness and nose dryness. Negative for mouth sores.   Eyes: Negative for pain, itching and dryness.  Respiratory: Positive for cough, shortness of breath, wheezing and difficulty breathing.   Cardiovascular: Positive for chest pain. Negative for palpitations.  Gastrointestinal: Negative for blood in stool, constipation and diarrhea.  Endocrine: Negative for increased urination.  Genitourinary: Negative for difficulty urinating.  Musculoskeletal: Negative for arthralgias, joint pain, joint swelling, myalgias, morning stiffness, muscle tenderness and myalgias.  Skin: Negative for color change, rash and redness.  Allergic/Immunologic: Positive for susceptible to infections.  Neurological: Negative for dizziness, numbness,  headaches and memory loss.  Hematological: Positive for bruising/bleeding tendency.  Psychiatric/Behavioral: Negative for confusion.    PMFS History:  Patient Active Problem List   Diagnosis Date Noted  . Encounter for screening for lung cancer 12/29/2019  . Excessive sleepiness 09/08/2014  . Fibromyalgia syndrome 08/25/2014  . RA (rheumatoid arthritis) ([place])   . GERD (gastroesophageal reflux disease)   . Anxiety   . Depression   . History of shingles   . Synovitis of hand   . Cervical dystonia 12/04/2012    Past Medical History:  Diagnosis Date  . Anxiety   . Cervical dystonia   . Depression   . Fibromyalgia   . GERD (gastroesophageal reflux disease)   . History of shingles   . RA (rheumatoid arthritis) ([place])   . Synovitis of hand     Family History  Problem Relation Age of Onset  . Arthritis Mother   . Heart Problems Father   . Heart disease Father   . Heart disease Paternal Uncle        MI   Past Surgical History:  Procedure Laterality Date  . TONSILLECTOMY    . 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 Right handed.    Caffeine- 4 cups daily.   Immunization History  Administered Date(s) Administered  . Influenza,inj,Quad PF,6+ Mos 05/25/2018, 04/03/2019, 04/26/2020  . Influenza-Unspecified 06/24/2014, 05/01/2015, 04/10/2016, 04/06/2017  . Moderna Sars-Covid-2 Vaccination 08/29/2019, 09/26/2019, 06/19/2020  . Pneumococcal Conjugate-13 05/01/2015  . Pneumococcal  Polysaccharide-23 04/10/2016  . Tdap 09/09/2015     Objective: Vital Signs: BP 127/77 (BP Location: Left Arm, Patient Position: Sitting, Cuff Size: Normal)   Pulse 100   Resp 15   Ht 5\' 6"  (1.676 m)   Wt 176 lb 9.6 oz (80.1 kg)   BMI 28.50 kg/m    Physical Exam Vitals and nursing note reviewed.  Constitutional:      Appearance: She is  well-developed.  HENT:     Head: Normocephalic and atraumatic.  Eyes:     Conjunctiva/sclera: Conjunctivae normal.  Cardiovascular:     Rate and Rhythm: Normal rate and regular rhythm.     Heart sounds: Normal heart sounds.  Pulmonary:     Effort: Pulmonary effort is normal.     Breath sounds: Rales present.  Abdominal:     General: Bowel sounds are normal.     Palpations: Abdomen is soft.  Musculoskeletal:     Cervical back: Normal range of motion.  Lymphadenopathy:     Cervical: No cervical adenopathy.  Skin:    General: Skin is warm and dry.     Capillary Refill: Capillary refill takes less than 2 seconds.  Neurological:     Mental Status: She is alert and oriented to person, place, and time.  Psychiatric:        Behavior: Behavior normal.      Musculoskeletal Exam: C-spine was in good range of motion.  Shoulder joints, elbow joints, wrist joints, MCPs PIPs and DIPs with good range of motion with no synovitis.  Hip joints, knee joints, ankles, MTPs and PIPs with good range of motion with no synovitis.  CDAI Exam: CDAI Score: 0  Patient Global: 0 mm; Provider Global: 0 mm Swollen: 0 ; Tender: 0  Joint Exam 09/17/2020   No joint exam has been documented for this visit   There is currently no information documented on the homunculus. Go to the Rheumatology activity and complete the homunculus joint exam.  Investigation: No additional findings.  Imaging: DG Chest 2 View  Result Date: 09/08/2020 CLINICAL DATA:  Chest pain. EXAM: CHEST - 2 VIEW COMPARISON:  CT 06/16/2020.  Chest x-ray 05/21/2020. FINDINGS: Mediastinum hilar structures normal. Bibasilar atelectasis and infiltrates. Small left pleural effusion. No pneumothorax. Heart size stable. No acute bony abnormality. Bowel distention cannot be excluded. Abdominal series can be obtained for further evaluation. IMPRESSION: 1. Bibasilar atelectasis and infiltrates. Small left pleural effusion. 2. Bowel distention cannot be  excluded. Abdominal series can be obtained to further evaluate. Electronically Signed   By: Marcello Moores  Register   On: 09/08/2020 06:03    Recent Labs: Lab Results  Component Value Date   WBC 13.1 (H) 09/08/2020   HGB 11.0 (L) 09/08/2020   PLT 284 09/08/2020   NA 136 09/08/2020   K 3.6 09/08/2020   CL 102 09/08/2020   CO2 25 09/08/2020   GLUCOSE 118 (H) 09/08/2020   BUN 11 09/08/2020   CREATININE 1.03 (H) 09/08/2020   BILITOT 0.4 08/02/2020   ALKPHOS 71 03/13/2019   AST 19 08/02/2020   ALT 16 08/02/2020   PROT 6.8 08/02/2020   ALBUMIN 3.6 03/13/2019   CALCIUM 9.1 09/08/2020   GFRAA 60 08/02/2020   QFTBGOLD Negative 09/14/2015   QFTBGOLDPLUS NEGATIVE 03/24/2020    Speciality Comments: Remicade 6mg /kg every 6 weeks 07/13-09/21 Rinvoq 15 mg qd 04/28/20 MTX - dcd due to increase in [place] started 06/21/20   Procedures:  No procedures performed Allergies: Patient has no known allergies.  Assessment / Plan:     Visit Diagnoses: Rheumatoid arthritis of multiple sites with negative rheumatoid factor -her rheumatoid arthritis is well controlled without any synovitis.  She has been tolerating Rinvoq and Arava without any problems.  High risk medication use - Rinvoq 15 mg 1 tablet by mouth daily (started 04/28/20) and Arava 10 mg 1 tablet by mouth daily (started 06/21/20).  Inadequate response to Remicade 6mg /kg ever.  Labs from September 09, 2018 were stable.  White cell count was high due to prednisone use.  Creatinine is mildly elevated.  We will continue to monitor labs closely.  Shortness of breath-patient has been having recurrent chest wall pain and shortness of breath since November 2021.  She had 2 ER visits.  I reviewed her chart today and found that she had a CT angio of the chest on September 15, 2019 which showed stable patchy peripheral opacities in both lungs consistent with chronic interstitial disease or scarring.  She also had a lung nodule.  On September 08, 2020 she was seen in  the emergency room with the chest wall pain.  At that time the chest x-ray showed atelectasis and small pleural effusion.  She was placed on prednisone 50 mg p.o. daily.  She states when she tapered prednisone her symptoms got worse.  She was given a prescription by her PCP of prednisone 20 mg p.o. daily.  I discussed this conversation with Dr. Vaughan Browner.  He reviewed the chart.  He recommended high-resolution CT which she will schedule himself.  He will also see her in the next couple of weeks.  You advised to keep her on prednisone 20 mg p.o. daily.  Patient will call us for more prednisone if she runs out of the prescription.  Sicca complex (HCC)-her sicca symptoms are mild.  Fibromyalgia-she states that fibromyalgia symptoms are fairly well controlled.  Chronic fatigue-she continues to have some fatigue due to fibromyalgia and insomnia.  Primary insomnia-good sleep hygiene was discussed.  History of gastroesophageal reflux (GERD)  Anxiety and depression  Recurrent UTI  Orders: No orders of the defined types were placed in this encounter.  No orders of the defined types were placed in this encounter.   Follow-Up Instructions: Return in about 2 months (around 11/17/2020) for Rheumatoid arthritis.   Bo Merino, MD  Note - This record has been created using Editor, commissioning.  Chart creation errors have been sought, but may not always  have been located. Such creation errors do not reflect on  the standard of medical care.

## 2020-09-16 ENCOUNTER — Ambulatory Visit: Payer: 59 | Admitting: Neurology

## 2020-09-17 ENCOUNTER — Encounter: Payer: Self-pay | Admitting: Pulmonary Disease

## 2020-09-17 ENCOUNTER — Encounter: Payer: Self-pay | Admitting: Rheumatology

## 2020-09-17 ENCOUNTER — Other Ambulatory Visit: Payer: Self-pay

## 2020-09-17 ENCOUNTER — Other Ambulatory Visit: Payer: Self-pay | Admitting: *Deleted

## 2020-09-17 ENCOUNTER — Ambulatory Visit: Payer: 59 | Admitting: Rheumatology

## 2020-09-17 VITALS — BP 127/77 | HR 100 | Resp 15 | Ht 66.0 in | Wt 176.6 lb

## 2020-09-17 DIAGNOSIS — F5101 Primary insomnia: Secondary | ICD-10-CM

## 2020-09-17 DIAGNOSIS — Z8719 Personal history of other diseases of the digestive system: Secondary | ICD-10-CM

## 2020-09-17 DIAGNOSIS — R5382 Chronic fatigue, unspecified: Secondary | ICD-10-CM

## 2020-09-17 DIAGNOSIS — R0602 Shortness of breath: Secondary | ICD-10-CM | POA: Diagnosis not present

## 2020-09-17 DIAGNOSIS — M3500 Sicca syndrome, unspecified: Secondary | ICD-10-CM | POA: Diagnosis not present

## 2020-09-17 DIAGNOSIS — Z79899 Other long term (current) drug therapy: Secondary | ICD-10-CM | POA: Diagnosis not present

## 2020-09-17 DIAGNOSIS — F32A Depression, unspecified: Secondary | ICD-10-CM

## 2020-09-17 DIAGNOSIS — M0609 Rheumatoid arthritis without rheumatoid factor, multiple sites: Secondary | ICD-10-CM | POA: Diagnosis not present

## 2020-09-17 DIAGNOSIS — M797 Fibromyalgia: Secondary | ICD-10-CM

## 2020-09-17 DIAGNOSIS — R091 Pleurisy: Secondary | ICD-10-CM

## 2020-09-17 DIAGNOSIS — F419 Anxiety disorder, unspecified: Secondary | ICD-10-CM

## 2020-09-17 DIAGNOSIS — J849 Interstitial pulmonary disease, unspecified: Secondary | ICD-10-CM

## 2020-09-17 DIAGNOSIS — N390 Urinary tract infection, site not specified: Secondary | ICD-10-CM

## 2020-09-17 NOTE — Progress Notes (Signed)
Called and spoke with Patient. Patient scheduled 09/23/20 at 1:30 with Dr. Vaughan Browner. HRCT ordered next available.

## 2020-09-17 NOTE — Progress Notes (Signed)
Patient scheduled 09/23/20 with Dr.Mannam at 1:30. HRCT ordered next available.  Patient is aware of appointment and address.

## 2020-09-17 NOTE — Progress Notes (Signed)
Received call from Dr. Estanislado Pandy about patient needing to be seen  Order HRCT for ILD  Please make appointment on 3/24 with me at 1:30 pm Let patient know about the appointments. Thanks

## 2020-09-22 ENCOUNTER — Other Ambulatory Visit: Payer: Self-pay | Admitting: *Deleted

## 2020-09-23 ENCOUNTER — Other Ambulatory Visit: Payer: Self-pay | Admitting: Neurology

## 2020-09-23 ENCOUNTER — Encounter: Payer: Self-pay | Admitting: Pulmonary Disease

## 2020-09-23 ENCOUNTER — Ambulatory Visit: Payer: 59 | Admitting: Pulmonary Disease

## 2020-09-23 ENCOUNTER — Other Ambulatory Visit: Payer: Self-pay

## 2020-09-23 VITALS — BP 140/78 | HR 107 | Temp 97.6°F | Ht 66.0 in | Wt 174.4 lb

## 2020-09-23 DIAGNOSIS — J849 Interstitial pulmonary disease, unspecified: Secondary | ICD-10-CM

## 2020-09-23 DIAGNOSIS — R0600 Dyspnea, unspecified: Secondary | ICD-10-CM | POA: Diagnosis not present

## 2020-09-23 MED ORDER — ANORO ELLIPTA 62.5-25 MCG/INH IN AEPB: 1.0000 | INHALATION_SPRAY | Freq: Every day | RESPIRATORY_TRACT | 0 refills | Status: DC

## 2020-09-23 MED ORDER — CLONAZEPAM 0.5 MG PO TABS: 0.5000 mg | ORAL_TABLET | Freq: Two times a day (BID) | ORAL | 5 refills | Status: DC | PRN

## 2020-09-23 NOTE — Progress Notes (Signed)
Courtney Myers    [id number]    May 07, 1961  Primary Care Physician:Pickard, Cammie Mcgee, MD  Referring Physician: Susy Frizzle, MD [address] [place],  [place] [address]  Chief complaint: Consult for dyspnea, chest pain  HPI: 60 year old active smoker fibromyalgia, GERD, rheumatoid arthritis She is followed for seronegative arthritis by Dr. Estanislado Pandy and is well controlled on Rinvoq and [place].  Complains of recurrent reproducible chest pain since November 2021.  She is seen in the ER in November 2021 and March 2022 for similar complaints and this was thought to be secondary to costochondritis She had a CT angiogram in December 2022 which did not show any PE.  There are some subtle changes suggestive of interstitial lung disease.  She has been recently started on prednisone with improvement in symptoms.  However she is having insomnia and mood changes from the steroids  Reports intermittent episodes of sternal and back pain, chest tightness.  Denies any cough, sputum production  Pets: 2 dogs Occupation: Web designer Exposures: No mold, hot tub, Jacuzzi.  No feather pillows or comforters Smoking history: 40-pack-year smoker.  Continues to smoke 1 pack/day Travel history: No significant travel history Relevant family history: No family history of lung disease   Outpatient Encounter Medications as of 09/23/2020  Medication Sig  . aspirin EC 81 MG tablet Take 1 tablet (81 mg total) by mouth daily. Swallow whole.  . baclofen (LIORESAL) 10 MG tablet Take 10 mg by mouth daily as needed for muscle spasms.   . clonazePAM (KLONOPIN) 0.5 MG tablet Take 1 tablet (0.5 mg total) by mouth 2 (two) times daily as needed. #60 must last 30 days.  Marland Kitchen CRANBERRY PO Take 2 capsules by mouth in the morning and at bedtime.   . DULoxetine (CYMBALTA) 60 MG capsule TAKE 1 CAPSULE(60 MG) BY MOUTH DAILY  . DYSPORT 500 units SOLR injection INJECT 500 UNITS  INTRAMUSCULARLY  EVERY 3  MONTHS (GIVEN AT MD OFFICE, DISCARD UNUSED)  . leflunomide (ARAVA) 10 MG tablet Take 1 tablet (10 mg total) by mouth daily.  . Multiple Vitamins-Minerals (MULTIVITAMINS THER. W/MINERALS) TABS Take 1 tablet by mouth at bedtime.   Marland Kitchen omeprazole (PRILOSEC) 20 MG capsule TAKE 1 CAPSULE BY MOUTH  EVERY DAY  . predniSONE (DELTASONE) 20 MG tablet Take 20 mg by mouth daily with breakfast.  . RINVOQ 15 MG TB24 TAKE 1 TABLET BY MOUTH  DAILY  . rosuvastatin (CRESTOR) 20 MG tablet Take 1 tablet (20 mg total) by mouth daily.  . traMADol (ULTRAM) 50 MG tablet Take 1 - 2 every 8 hours as needed for pain.  Marland Kitchen umeclidinium-vilanterol (ANORO ELLIPTA) 62.5-25 MCG/INH AEPB Inhale 1 puff into the lungs daily.  . Xylitol (XYLIMELTS MT) Use as directed 2 tablets in the mouth or throat at bedtime as needed (dry mouth).   . folic acid (FOLVITE) 1 MG tablet TAKE 1 TABLET(1 MG) BY MOUTH DAILY   No facility-administered encounter medications on file as of 09/23/2020.    Allergies as of 09/23/2020  . (No Known Allergies)    Past Medical History:  Diagnosis Date  . Anxiety   . Cervical dystonia   . Depression   . Fibromyalgia   . GERD (gastroesophageal reflux disease)   . History of shingles   . RA (rheumatoid arthritis) ([place])   . Synovitis of hand     Past Surgical History:  Procedure Laterality Date  . TONSILLECTOMY    .  VAGINAL HYSTERECTOMY  3/08    Family History  Problem Relation Age of Onset  . Arthritis Mother   . Heart Problems Father   . Heart disease Father   . Heart disease Paternal Uncle        MI    Social History   Socioeconomic History  . Marital status: Married    Spouse name: [name]  . Number of children: 2  . Years of education: 52  . Highest education level: Not on file  Occupational History    Employer: [employer]  Tobacco Use  . Smoking status: Current Every Day Smoker    Packs/day: 1.00    Years: 34.00    Pack years: 34.00    Types: Cigarettes    Start  date: 35  . Smokeless tobacco: Never Used  Vaping Use  . Vaping Use: Never used  Substance and Sexual Activity  . Alcohol use: Yes    Alcohol/week: 0.0 standard drinks    Comment: Rare, twice per year  . Drug use: Never  . Sexual activity: Yes    Birth control/protection: Surgical    Comment: 1st intercourse 60 yo-Fewer than 5 partners  Other Topics Concern  . 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 Right handed.    Caffeine- 4 cups daily.   Social Determinants of Health   Financial Resource Strain: Not on file  Food Insecurity: Not on file  Transportation Needs: Not on file  Physical Activity: Not on file  Stress: Not on file  Social Connections: Not on file  Intimate Partner Violence: Not on file    Review of systems: Review of Systems  Constitutional: Negative for fever and chills.  HENT: Negative.   Eyes: Negative for blurred vision.  Respiratory: as per HPI  Cardiovascular: Negative for chest pain and palpitations.  Gastrointestinal: Negative for vomiting, diarrhea, blood per rectum. Genitourinary: Negative for dysuria, urgency, frequency and hematuria.  Musculoskeletal: Negative for myalgias, back pain and joint pain.  Skin: Negative for itching and rash.  Neurological: Negative for dizziness, tremors, focal weakness, seizures and loss of consciousness.  Endo/Heme/Allergies: Negative for environmental allergies.  Psychiatric/Behavioral: Negative for depression, suicidal ideas and hallucinations.  All other systems reviewed and are negative.  Physical Exam: Blood pressure 140/78, pulse (!) 107, temperature 97.6 F (36.4 C), temperature source Temporal, height 5\' 6"  (1.676 m), weight 174 lb 6.4 oz (79.1 kg), SpO2 97 %. Gen:      No acute distress HEENT:  EOMI, sclera anicteric Neck:     No masses; no thyromegaly Lungs:    Clear to  auscultation bilaterally; normal respiratory effort CV:         Regular rate and rhythm; no murmurs Abd:      + bowel sounds; soft, non-tender; no palpable masses, no distension Ext:    No edema; adequate peripheral perfusion Skin:      Warm and dry; no rash Neuro: alert and oriented x 3 Psych: normal mood and affect  Data Reviewed: Imaging: CTA 06/16/2020-no pulmonary embolism, patchy peripheral opacities consistent with chronic interstitial lung disease, stable right lung nodules, emphysema.  I have reviewed the images personally.  PFTs:  Labs: CBC 08/02/2020-WBC 9.8, eos 0.4%, absolute eosinophil count 39  Assessment:  Evaluation of dyspnea, atypical chest pain Her chest pain appears is consistent with costochondritis, perhaps related to her rheumatoid arthritis or fibromyalgia.  The  pain has responded to steroids No PE on prior CTA  She will need evaluation for interstitial lung disease given suggestive findings on CT angiogram in December. I suspect she also has COPD that is contributing to her presentation as there are findings of emphysema and she is a heavy active smoker.  We have a high-resolution scan that is pending, I will check PFTs Trial of anoro inhaler.  She will not require inhaled corticosteroids as peripheral eosinophils are low.  Plan/Recommendations: Follow results of high-resolution CT Pulmonary function test Start Anoro inhaler  Marshell Garfinkel MD [facility] 09/23/2020, 2:45 PM  CC: Susy Frizzle, MD

## 2020-09-23 NOTE — Patient Instructions (Signed)
We will start you on an inhaler called anoro Schedule pulmonary function test for better evaluation of the lung I will review the CT chest when done  Follow-up in 1 to 2 months.

## 2020-09-28 ENCOUNTER — Encounter: Payer: Self-pay | Admitting: Neurology

## 2020-09-28 ENCOUNTER — Other Ambulatory Visit: Payer: Self-pay

## 2020-09-28 ENCOUNTER — Ambulatory Visit: Payer: 59 | Admitting: Neurology

## 2020-09-28 VITALS — BP 152/94 | HR 105 | Ht 66.0 in | Wt 174.0 lb

## 2020-09-28 DIAGNOSIS — G243 Spasmodic torticollis: Secondary | ICD-10-CM

## 2020-09-28 NOTE — Progress Notes (Signed)
**  Dysport 500 units x 1 vial, NDC [address]-4, Lot [id number], Exp 04/01/2021, specialty pharmacy.//mck,rn**

## 2020-09-28 NOTE — Progress Notes (Signed)
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  Symptom onset was without apparent triggering event, she has gradually developed this neck pulling,  manifested primarily with right laterocollis, mild retrocolis, and left shoulder elevation since 2000. She has good relief with regular Botox injection, about 4 times a year since 2003. Last injection was February 18, 2010 by Dr. Marta Antu,  She reported great relief as usual, taking 4- 5 days to get symptom relieve, lasting about 3 months.  I began to inject her since 12.2011, every 3-4 months, dosage has been limited to 150 units of BOTOX A.  She denies neck pain, gait difficulty, but with wearing off Botox, she noticed increased head tremor, and pulling towards her right shoulder.  She developed  transient difficulty lifting her neck up when using BOTOX A 200 units previously, reponding well to decreased dose of 150 units  She was enrolled into IPSEN dysport study, first injection was in April 30th 2013, she later enrolled into open label, 2nd injection was in  May 28th 2013. She did very well.  Recent few weeks,  She had experienced flareup of her rheumatoid arthritis, has received IV infusion. Last injection was in 06/10/2012,  500 units of dysport was dissolved into 2 cc of normal saline. Right longissimus capitus 0.5 cc Right splenius capitis 0.5 cc Right levator scapular 0.5 cc Right iliocostalis 0.5 cc   Dysport research study has completed, she is now coming back for repeat injection, she noticed head bobbing over the past few weeks, she denies significant neck pain, weakness,  Her rheumatoid arthritis is under better control   Last injection was in Sep 2014, she did very well, no neck extension weakness, only rarely neck shaking  UPDATE March 8th 2016: She has lost follow-up since her last  visit December 2014, She came in for treatment her cervical dystonia, she feel the tension of her neck all the time, stiffness. Denied gait difficulty, last injection was September 2014, she received 500 units of Dysport, works well for her, she is continue on medication for anxiety, rheumatoid arthritis She also complains of nighttime snoring, frequent awakening, excessive daytime sleepiness, fatigue, today's ESS is 6, FSS was 19,  UPDATE October 13 2014:   She has missed her scheduled sleep study, continue have worsening neck posturing, posterior neck pain, excessive daytime fatigue and sleepiness  UPDATE Nov 11 2014: She came in for EMG guided Dysport injection today, last injection was December 2014, she complains of worsening head titubation, bilateral hands mild postural tremor, posterior neck pain,  Update February 17 2015 She responded very well to last EMG guided Dysport injection Nov 11 2014, no significant head titubation, no significant neck pain  Update July 22 2015: Last injection was August, now she noticed returning of neck pulling and shaking, posterior neck muscle achy pain  Update October 21 2015: Last EMG guided Dysport injection was in January, she responded very well, only recent couple weeks, she noticed returning of neck pulling, shaking again, She has worsening rheumatoid arthritis, is receiving more frequent treatment, complains fatigue, diffuse body achy pain  Update March 15 2016: She responded very well to previous Dysport injection in April, no significant side effect noticed, only recent few weeks, 5 months from previous injection, she noticed return of head shaking neck muscle tightness.  UPDATE Dec 14th 2017: She responded very well  to previous injection in Sept 2017, no significant side effect noticed.  Update September 13 2016: She did well this last injection in December  Update January 15 2017: She noticed worsening head tremor, she had excessive stress,  her sister passed away from lung cancer in April 2018,  UPDATE Apr 25 2017: She responded very well with previous injection, barely has noticeable head shaking.  Update August 01, 2017: She responded very well to previous dysport injection, only recently began to have recurrent head shaking  UPDATE December 19 2017: She responded well to previous injection  UPDATE Sept 25 2019: She responded very well to previous injection  REVIEW OF SYSTEMS: Full 14 system review of systems performed and notable only for ear pain, joint pain, joint swelling, achy muscles, muscle cramps, rash, tremors, depression, anxiety  ALLERGIES: No Known Allergies  HOME MEDICATIONS: Current Outpatient Medications  Medication Sig Dispense Refill  . aspirin EC 81 MG tablet Take 1 tablet (81 mg total) by mouth daily. Swallow whole. 90 tablet 3  . baclofen (LIORESAL) 10 MG tablet Take 10 mg by mouth daily as needed for muscle spasms.     . clonazePAM (KLONOPIN) 0.5 MG tablet Take 1 tablet (0.5 mg total) by mouth 2 (two) times daily as needed. #60 must last 30 days. 60 tablet 5  . CRANBERRY PO Take 2 capsules by mouth in the morning and at bedtime.     . DULoxetine (CYMBALTA) 60 MG capsule TAKE 1 CAPSULE(60 MG) BY MOUTH DAILY 30 capsule 2  . DYSPORT 500 units SOLR injection INJECT 500 UNITS  INTRAMUSCULARLY EVERY 3  MONTHS (GIVEN AT MD OFFICE, DISCARD UNUSED) 1 each 3  . leflunomide (ARAVA) 10 MG tablet Take 1 tablet (10 mg total) by mouth daily. 90 tablet 0  . Multiple Vitamins-Minerals (MULTIVITAMINS THER. W/MINERALS) TABS Take 1 tablet by mouth at bedtime.     Marland Kitchen omeprazole (PRILOSEC) 20 MG capsule TAKE 1 CAPSULE BY MOUTH  EVERY DAY 90 capsule 3  . predniSONE (DELTASONE) 20 MG tablet Take 20 mg by mouth daily with breakfast.    . RINVOQ 15 MG TB24 TAKE 1 TABLET BY MOUTH  DAILY 90 tablet 0  . traMADol (ULTRAM) 50 MG tablet Take 1 - 2 every 8 hours as needed for pain. 30 tablet 0  . umeclidinium-vilanterol (ANORO  ELLIPTA) 62.5-25 MCG/INH AEPB Inhale 1 puff into the lungs daily. 14 each 0  . Xylitol (XYLIMELTS MT) Use as directed 2 tablets in the mouth or throat at bedtime as needed (dry mouth).     . rosuvastatin (CRESTOR) 20 MG tablet Take 1 tablet (20 mg total) by mouth daily. 90 tablet 3   No current facility-administered medications for this visit.    PAST MEDICAL HISTORY: Past Medical History:  Diagnosis Date  . Anxiety   . Cervical dystonia   . Depression   . Fibromyalgia   . GERD (gastroesophageal reflux disease)   . History of shingles   . RA (rheumatoid arthritis) ([place])   . Synovitis of hand     PAST SURGICAL HISTORY: Past Surgical History:  Procedure Laterality Date  . TONSILLECTOMY    . VAGINAL HYSTERECTOMY  3/08    FAMILY HISTORY: Family History  Problem Relation Age of Onset  . Arthritis Mother   . Heart Problems Father   . Heart disease Father   . Heart disease Paternal Uncle        MI    SOCIAL HISTORY:  Social History   Socioeconomic  History  . Marital status: Married    Spouse name: [name]  . Number of children: 2  . Years of education: 91  . Highest education level: Not on file  Occupational History    Employer: [employer]  Tobacco Use  . Smoking status: Current Every Day Smoker    Packs/day: 1.00    Years: 34.00    Pack years: 34.00    Types: Cigarettes    Start date: 63  . Smokeless tobacco: Never Used  Vaping Use  . Vaping Use: Never used  Substance and Sexual Activity  . Alcohol use: Yes    Alcohol/week: 0.0 standard drinks    Comment: Rare, twice per year  . Drug use: Never  . Sexual activity: Yes    Birth control/protection: Surgical    Comment: 1st intercourse 60 yo-Fewer than 5 partners  Other Topics Concern  . 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 Right handed.    Caffeine- 4 cups daily.   Social  Determinants of Health   Financial Resource Strain: Not on file  Food Insecurity: Not on file  Transportation Needs: Not on file  Physical Activity: Not on file  Stress: Not on file  Social Connections: Not on file  Intimate Partner Violence: Not on file     PHYSICAL EXAM   Vitals:   09/28/20 1312  BP: (!) 152/94  Pulse: (!) 105  Weight: 174 lb (78.9 kg)  Height: 5\' 6"  (1.676 m)   Not recorded     Body mass index is 28.08 kg/m.  PHYSICAL EXAMNIATION: MENTAL STATUS: Speech is normal, normal to casual conversation, and history taking  CRANIAL NERVES: mild retrocollis, right tilt, mild right lateral shift, slight left shoulder elevation  MOTOR: Normal tone and bulk and strength COORDINATION: No dysmetria   GAIT/STANCE: Posture is normal.  DIAGNOSTIC DATA (LABS, IMAGING, TESTING) - I reviewed patient records, labs, notes, testing and imaging myself where available.  ASSESSMENT AND PLAN  Emmaline Wahba is a 60 y.o. female with long-standing history of cervical dystonia, responded very well to previous EMG guided Dysport injection, last injection was May 2016, responded very well.   Mild retrocollis, right tilt, mild right lateral shift, slight left shoulder elevation  500 of Dysport was dissolved into 2.5 cc of normal saline,  Right longissimus capitus 0. 5 cc Left splenius capitis 0.5 cc Right inferior oblique capitis, 0.5 cc  Left inferior oblique capitis 0.5 cc Left levator scapular 0.5   Patient tolerate the injection well, will return to clinic in 3 months for repeat injection Please dissolve 500 units of Dysport into 2.5 cc of normal saline   Marcial Pacas, M.D. Ph.D.  [facility] [address], [place], [place] [address] Ph: [phone] Fax: [phone]      PATIENT: Katianne Barre DOB: [birth year]  HISTORICAL  Lysandra Loughmiller  is a 60 year-old right-handed Caucasian female, came in for EMG  guided Botox for cervical dystonia.  Symptom onset was without apparent triggering event, she has gradually developed this neck pulling,  manifested primarily with right laterocollis, mild retrocolis, and left shoulder elevation since 2000. She has good relief with regular Botox injection, about 4 times a year since 2003. Last injection was February 18, 2010 by Dr. Marta Antu,  She reported great relief as usual, taking 4- 5 days to get symptom relieve, lasting  about 3 months.  I began to inject her since 12.2011, every 3-4 months, dosage has been limited to 150 units of BOTOX A.  She denies neck pain, gait difficulty, but with wearing off Botox, she noticed increased head tremor, and pulling towards her right shoulder.  She developed  transient difficulty lifting her neck up when using BOTOX A 200 units previously, reponding well to decreased dose of 150 units  She was enrolled into IPSEN dysport study, first injection was in April 30th 2013, she later enrolled into open label, 2nd injection was in  May 28th 2013. She did very well.  Recent few weeks,  She had experienced flareup of her rheumatoid arthritis, has received IV infusion. Last injection was in 06/10/2012,  500 units of dysport was dissolved into 2 cc of normal saline. Right longissimus capitus 0.5 cc Right splenius capitis 0.5 cc Right levator scapular 0.5 cc Right iliocostalis 0.5 cc   Dysport research study has completed, she is now coming back for repeat injection, she noticed head bobbing over the past few weeks, she denies significant neck pain, weakness,  Her rheumatoid arthritis is under better control   Last injection was in Sep 2014, she did very well, no neck extension weakness, only rarely neck shaking  UPDATE March 8th 2016: She has lost follow-up since her last visit December 2014, She came in for treatment her cervical dystonia, she feel the tension of her neck all the time, stiffness. Denied gait difficulty, last  injection was September 2014, she received 500 units of Dysport, works well for her, she is continue on medication for anxiety, rheumatoid arthritis She also complains of nighttime snoring, frequent awakening, excessive daytime sleepiness, fatigue, today's ESS is 6, FSS was 50,  UPDATE October 13 2014:   She has missed her scheduled sleep study, continue have worsening neck posturing, posterior neck pain, excessive daytime fatigue and sleepiness  UPDATE Nov 11 2014: She came in for EMG guided Dysport injection today, last injection was December 2014, she complains of worsening head titubation, bilateral hands mild postural tremor, posterior neck pain,  Update February 17 2015 She responded very well to last EMG guided Dysport injection Nov 11 2014, no significant head titubation, no significant neck pain  Update July 22 2015: Last injection was August, now she noticed returning of neck pulling and shaking, posterior neck muscle achy pain  Update October 21 2015: Last EMG guided Dysport injection was in January, she responded very well, only recent couple weeks, she noticed returning of neck pulling, shaking again, She has worsening rheumatoid arthritis, is receiving more frequent treatment, complains fatigue, diffuse body achy pain  Update March 15 2016: She responded very well to previous Dysport injection in April, no significant side effect noticed, only recent few weeks, 5 months from previous injection, she noticed return of head shaking neck muscle tightness.  UPDATE Dec 14th 2017: She responded very well to previous injection in Sept 2017, no significant side effect noticed.  Update September 13 2016: She did well this last injection in December  Update January 15 2017: She noticed worsening head tremor, she had excessive stress, her sister passed away from lung cancer in April 2018,  UPDATE Apr 25 2017: She responded very well with previous injection, barely has noticeable head  shaking.  Update August 01, 2017: She responded very well to previous dysport injection, only recently began to have recurrent head shaking  UPDATE December 19 2017: She responded well to previous injection  UPDATE Jun 20 2018: She did very well with previous injection  UPDATE October 01 2018: She did well to previous injection  UPDATE January 01 2019: She did well with previous injection  UPDATE Oct 28th 2020: She did well to previous injections  UPDATE Jan 27th 2021: She did well to previous injections  Update October 29, 2019: She did well with previous injection, no significant side effect noted.  Update February 04, 2020, Uses Dysport 500 units, responding well  UPDATE Jun 02 2020: Uses Dysport 500 units, responding well  Update September 28, 2020: She responded well to previous injection, unfortunately she has been a longtime smoker, diagnosed with COPD, chest x-ray showed bibasilar atelectasis, and infiltration, small left pleural effusion on September 08, 2020   REVIEW OF SYSTEMS: Full 14 system review of systems performed and notable only for as above.  All rest review of system were negative.  ALLERGIES: No Known Allergies  HOME MEDICATIONS: Current Outpatient Medications  Medication Sig Dispense Refill  . aspirin EC 81 MG tablet Take 1 tablet (81 mg total) by mouth daily. Swallow whole. 90 tablet 3  . baclofen (LIORESAL) 10 MG tablet Take 10 mg by mouth daily as needed for muscle spasms.     . clonazePAM (KLONOPIN) 0.5 MG tablet Take 1 tablet (0.5 mg total) by mouth 2 (two) times daily as needed. #60 must last 30 days. 60 tablet 5  . CRANBERRY PO Take 2 capsules by mouth in the morning and at bedtime.     . DULoxetine (CYMBALTA) 60 MG capsule TAKE 1 CAPSULE(60 MG) BY MOUTH DAILY 30 capsule 2  . DYSPORT 500 units SOLR injection INJECT 500 UNITS  INTRAMUSCULARLY EVERY 3  MONTHS (GIVEN AT MD OFFICE, DISCARD UNUSED) 1 each 3  . leflunomide (ARAVA) 10 MG tablet Take 1 tablet (10 mg  total) by mouth daily. 90 tablet 0  . Multiple Vitamins-Minerals (MULTIVITAMINS THER. W/MINERALS) TABS Take 1 tablet by mouth at bedtime.     Marland Kitchen omeprazole (PRILOSEC) 20 MG capsule TAKE 1 CAPSULE BY MOUTH  EVERY DAY 90 capsule 3  . predniSONE (DELTASONE) 20 MG tablet Take 20 mg by mouth daily with breakfast.    . RINVOQ 15 MG TB24 TAKE 1 TABLET BY MOUTH  DAILY 90 tablet 0  . traMADol (ULTRAM) 50 MG tablet Take 1 - 2 every 8 hours as needed for pain. 30 tablet 0  . umeclidinium-vilanterol (ANORO ELLIPTA) 62.5-25 MCG/INH AEPB Inhale 1 puff into the lungs daily. 14 each 0  . Xylitol (XYLIMELTS MT) Use as directed 2 tablets in the mouth or throat at bedtime as needed (dry mouth).     . rosuvastatin (CRESTOR) 20 MG tablet Take 1 tablet (20 mg total) by mouth daily. 90 tablet 3   No current facility-administered medications for this visit.    PAST MEDICAL HISTORY: Past Medical History:  Diagnosis Date  . Anxiety   . Cervical dystonia   . Depression   . Fibromyalgia   . GERD (gastroesophageal reflux disease)   . History of shingles   . RA (rheumatoid arthritis) ([place])   . Synovitis of hand     PAST SURGICAL HISTORY: Past Surgical History:  Procedure Laterality Date  . TONSILLECTOMY    . VAGINAL HYSTERECTOMY  3/08    FAMILY HISTORY: Family History  Problem Relation Age of Onset  . Arthritis Mother   . Heart Problems Father   . Heart disease Father   . Heart disease Paternal Uncle  MI    SOCIAL HISTORY:  Social History   Socioeconomic History  . Marital status: Married    Spouse name: [name]  . Number of children: 2  . Years of education: 17  . Highest education level: Not on file  Occupational History    Employer: [employer]  Tobacco Use  . Smoking status: Current Every Day Smoker    Packs/day: 1.00    Years: 34.00    Pack years: 34.00    Types: Cigarettes    Start date: 49  . Smokeless tobacco: Never Used  Vaping Use  . Vaping Use: Never used   Substance and Sexual Activity  . Alcohol use: Yes    Alcohol/week: 0.0 standard drinks    Comment: Rare, twice per year  . Drug use: Never  . Sexual activity: Yes    Birth control/protection: Surgical    Comment: 1st intercourse 60 yo-Fewer than 5 partners  Other Topics Concern  . 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 Right handed.    Caffeine- 4 cups daily.   Social Determinants of Health   Financial Resource Strain: Not on file  Food Insecurity: Not on file  Transportation Needs: Not on file  Physical Activity: Not on file  Stress: Not on file  Social Connections: Not on file  Intimate Partner Violence: Not on file     PHYSICAL EXAM   Vitals:   09/28/20 1312  BP: (!) 152/94  Pulse: (!) 105  Weight: 174 lb (78.9 kg)  Height: 5\' 6"  (1.676 m)   Not recorded     Body mass index is 28.08 kg/m.  PHYSICAL EXAMNIATION:   Mild retrocollis, right tilt, mild right lateral shift, she has moderate tenderness at bilateral levator scapular muscle   ASSESSMENT AND PLAN  Regan Mcbryar is a 60 y.o. female with long-standing history of cervical dystonia, responded very well to previous EMG guided Dysport injection, l  Mild retrocollis, right tilt, mild right lateral shift, slight left shoulder elevation  500 of Dysport was dissolved into 2.5 cc of normal saline,  Right longissimus capitus 0. 5cc Right splenius capitis 0.25 c Right inferior oblique capitis 0.5 cc Left inferior oblique capitis 0. 5 cc Right levator scapula 0.25 cc Right splenius cervix 0.25 cc Left levator scapular 0. 25 cc   Patient tolerate the injection well, will return to clinic in 3 months for repeat injection Please dissolve 500 units of Dysport into 2.5 cc of normal saline   Marcial Pacas, M.D. Ph.D.  [facility] [address] [place], [place] [address] Ph: [phone] Fax: [phone]

## 2020-09-29 ENCOUNTER — Ambulatory Visit (INDEPENDENT_AMBULATORY_CARE_PROVIDER_SITE_OTHER)
Admission: RE | Admit: 2020-09-29 | Discharge: 2020-09-29 | Disposition: A | Discharge status: Home / Self Care | Payer: 59 | Source: Ambulatory Visit | Attending: Pulmonary Disease | Admitting: Pulmonary Disease

## 2020-09-29 ENCOUNTER — Other Ambulatory Visit: Payer: Self-pay

## 2020-09-29 DIAGNOSIS — J849 Interstitial pulmonary disease, unspecified: Secondary | ICD-10-CM

## 2020-09-29 MED ORDER — ANORO ELLIPTA 62.5-25 MCG/INH IN AEPB: 1.0000 | INHALATION_SPRAY | Freq: Every day | RESPIRATORY_TRACT | 3 refills | Status: DC

## 2020-10-05 ENCOUNTER — Other Ambulatory Visit: Payer: Self-pay | Admitting: *Deleted

## 2020-10-05 DIAGNOSIS — I2510 Atherosclerotic heart disease of native coronary artery without angina pectoris: Secondary | ICD-10-CM

## 2020-10-05 DIAGNOSIS — E041 Nontoxic single thyroid nodule: Secondary | ICD-10-CM

## 2020-10-08 ENCOUNTER — Other Ambulatory Visit: Payer: Self-pay | Admitting: Physician Assistant

## 2020-10-08 NOTE — Telephone Encounter (Signed)
Please call patient to schedule appt. Thank you.  Return in about 2 months (around 11/17/2020) for Rheumatoid arthritis

## 2020-10-08 NOTE — Telephone Encounter (Signed)
Will route message to our pharmacy team to see if they can help or direct her to a resource to help

## 2020-10-08 NOTE — Telephone Encounter (Signed)
Patient scheduled with pharmacy team on 10/12/20 @ 1:20pm to discuss smoking cessation strategies.

## 2020-10-08 NOTE — Telephone Encounter (Signed)
Next Visit: message sent to front desk to schedule appt,  Return in about 2 months (around 11/17/2020) for Rheumatoid arthritis    Last Visit: 09/17/2020  Last Fill:  07/05/2020  DX :Rheumatoid arthritis of multiple sites with negative rheumatoid factor   Current Dose per office note 09/17/2020,  Rinvoq 15 mg 1 tablet by mouth daily   Labs: 09/08/2020, Glucose 118, Creatinine 1.03, WBC 13.1, RBC 3.70, Hemoglobin 11.0, HCT 34.1,   TB Gold: 03/24/2020, negative  Okay to refill Rinvoq?

## 2020-10-08 NOTE — Telephone Encounter (Signed)
Please advise on patient mychart message    Good morning  I have been wearing nicotine patches for a week to quit smoking with no success.   I understand that you have a Pharmacist on staff that may could help me.  Could I get an appointment with him/her?  I really want to quit asap. Thank you!

## 2020-10-12 ENCOUNTER — Ambulatory Visit: Payer: 59 | Admitting: Pharmacist

## 2020-10-12 ENCOUNTER — Other Ambulatory Visit: Payer: Self-pay

## 2020-10-12 DIAGNOSIS — Z716 Tobacco abuse counseling: Secondary | ICD-10-CM

## 2020-10-12 MED ORDER — NICOTINE POLACRILEX 4 MG MT GUM: CHEWING_GUM | OROMUCOSAL | 2 refills | Status: DC

## 2020-10-12 MED ORDER — NICOTINE 21 MG/24HR TD PT24: 21.0000 mg | MEDICATED_PATCH | Freq: Every day | TRANSDERMAL | 0 refills | Status: DC

## 2020-10-12 NOTE — Progress Notes (Signed)
Subjective Courtney Myers presents to [facility] and seen by the pharmacist for smoking cessation counseling.    Patient started nicotine patches 10/01/20 and then stopped on 10/07/20. She does not have a history of seizures, but takes clonazepam twice daily.  [name] and her husband both smoke - he is also interested in quitting smoking and plans to establish care at [facility] and potentially connect for smoking cessation.   Her triggers include driving and generally being outside.  She states that since being placed on inhalers at pulm clinic, she's wanted to quit smoking. States that when her workplace became smoke-free, it helped cut down on smoking  Patient's serious quit attempts include: 1. In the 1990s - quit cold [place] and quit for 6 years 2. Patches + lozenge - failed to quit 3. Patches + lozenge - failed to quit   Social History   Tobacco Use  Smoking Status Current Every Day Smoker  . Packs/day: 1.00  . Years: 34.00  . Pack years: 34.00  . Types: Cigarettes  . Start date: 1979  Smokeless Tobacco Never Used     Tobacco Use History  Age when started using tobacco on a daily basis 17  Type: cigarettes.  Number of cigarettes per day 1 ppd (usually less than 1 pack ), brand Marlboro Lights.  Smokes first cigarette 3-4 minutes after waking.  Does not wake at night to smoke  Triggers include: driving, being outside (gardening), sitting on porch  Quit Attempt History   Most recent quit attempt: 3 serious quit attempts  Longest time ever been tobacco free 6 years in the 1990s  Methods tried in the past include Nicotine Inhaler, Nicotine patch.   Rates IMPORTANCE of quitting tobacco on 1-10 scale of 10  Rates READINESS of quitting tobacco on 1-10 scale of 10.  Rates CONFIDENCE of quitting; barriers include spouse/partner smokes     Immunization History  Administered Date(s) Administered  . Influenza,inj,Quad PF,6+ Mos 05/25/2018, 04/03/2019,  04/26/2020  . Influenza-Unspecified 06/24/2014, 05/01/2015, 04/10/2016, 04/06/2017  . Moderna Sars-Covid-2 Vaccination 08/29/2019, 09/26/2019, 06/19/2020  . Pneumococcal Conjugate-13 05/01/2015  . Pneumococcal Polysaccharide-23 04/10/2016  . Tdap 09/09/2015     Assessment and Plan  1. Smoking Cessation   Patient states she are ready to quit smoking.  Patient set quit date of tomorrow when she starts patch. She will start daily patch along with nicotine gum.  Discussed smoking cessation agent Chantix, Wellbutrin, and nicotine replacement therapies.  Patient is agreeable to starting patch with nicotine gum for now.  Nicotine Patch  Patient counseled on purpose, proper use, and potential adverse effects, including mild itching or redness at the point of application, headache, trouble sleeping, and/or vivid dreams    Patch Schedule for >10 cigarettes daily Weeks 1-6: one 21 mg patch daily Weeks 7-8: one 14 mg patch daily Weeks 9-10: one 7 mg patch daily  Nicotine Gum Patient counseled on purpose, proper use, and potential adverse effects including jaw soreness and upset stomach if swallowing saliva.  Instructed patient to use 4 mg if they smoke a pack a day.  Gum dosing schedule Weeks 1 to 6: Chew 1 piece of gum every 1 to 2 hours (maximum: 24 pieces/day); to increase chances of quitting, chew at least 9 pieces/day during the first 6 weeks Weeks 7 to 9: Chew 1 piece of gum every 2 to 4 hours (maximum: 24 pieces/day) Weeks 10 to 12: Chew 1 piece of gum every 4 to 8 hours (maximum:  24 pieces/day)  Provided information on 1 800-QUIT NOW support program.   2. Medication Reconciliation A drug regimen assessment was performed, including review of allergies, interactions, disease-state management, dosing and immunization history. Medications were reviewed with the patient, including name, instructions, indication, goals of therapy, potential side effects, importance of adherence, and safe  use.  Cost is not an issue for her. I sent prescription to pharmacy but patient advised that it may not be covered by her insurance. She verbalized understanding.  Phone call f/u scheduled with pharmacy team on 11/02/20 @ 1:20 pm.   Knox Saliva, PharmD, MPH Clinical Pharmacist (Rheumatology and Pulmonology)

## 2020-10-12 NOTE — Patient Instructions (Addendum)
Congratulations on deciding to quit smoking! This is a big step in improving your health!  Nicotine patch: Begin with step 1 (21 mg/day) for 6 weeks, followed by step 2 (14 mg/day) for 2 weeks; finish with step 3 (7 mg/day) for 2 weeks  Apply new patch to nonhairy, clean, dry skin on the upper body or upper outer arm; each patch should be applied to a different site. Apply immediately after removing backing from patch; press onto skin for ~10 seconds. Patch may be worn for 16 or 24 hours. If cigarette cravings occur upon awakening, wear for 24 hours; if vivid dreams or other sleep disturbances occur, remove the patch at bedtime and apply a new patch in the morning. Do not cut patch; causes rapid evaporation, rendering the patch useless. Do not wear more than 1 patch at a time; do not leave patch on for more than 24 hours (may irritate skin). Wash hands after applying or removing patch. Discard patches by folding adhesive ends together, replace in pouch and dispose of properly in trash.  Nicotine Gum: Chew 1 piece of gum when urge to smoke occurs. If strong or frequent cravings are present after 1 piece of gum, may use a second piece within the hour (do not continuously use one piece after the other). Patients who smoke their first cigarette within 30 minutes of waking should use the 4 mg strength. Use according to the following 12-week dosing schedule:  Weeks 1 to 6: Chew 1 piece of gum every 1 to 2 hours (maximum: 24 pieces/day); to increase chances of quitting, chew at least 9 pieces/day during the first 6 weeks  Weeks 7 to 9: Chew 1 piece of gum every 2 to 4 hours (maximum: 24 pieces/day)  Weeks 10 to 12: Chew 1 piece of gum every 4 to 8 hours (maximum: 24 pieces/day)  Chew slowly until it tingles, then place gum between cheek and gum until tingle is gone; repeat process until most of tingle is gone (~30 minutes). Do not eat or drink 15 minutes before using or while the gum is in  mouth.  1-800-QUIT-NOW 779-082-1675) Calling this toll-free number will connect you directly to your state quitline. All states have quitlines in place with trained coaches who provide information and help with quitting.

## 2020-10-19 ENCOUNTER — Other Ambulatory Visit: Payer: Self-pay | Admitting: *Deleted

## 2020-10-19 MED ORDER — ANORO ELLIPTA 62.5-25 MCG/INH IN AEPB: 1.0000 | INHALATION_SPRAY | Freq: Every day | RESPIRATORY_TRACT | 3 refills | Status: DC

## 2020-10-19 NOTE — Progress Notes (Signed)
Office Visit Note  Patient: Courtney Myers             Date of Birth: [birth year]           MRN: [id number]             PCP: Susy Frizzle, MD Referring: Susy Frizzle, MD Visit Date: 11/02/2020 Occupation: @GUAROCC @  Subjective:  Medication monitoring   History of Present Illness: Courtney Myers is a 60 y.o. female with history of seropositive rheumatoid arthritis and fibromyalgia.  Patient is taking Rinvoq 15 mg 1 tablet by mouth daily and Arava 10 mg 1 tablet by mouth daily.  She is tolerating both medications without any side effects.  She remains on prednisone 10 mg by mouth daily.  She denies any joint pain or joint swelling.  She denies any morning stiffness or nocturnal pain.  She has not had any fibromyalgia flares.  She is not experiencing any muscle tenderness or myalgias at this time.  She denies any fatigue currently.  She continues to have interrupted sleep and is unsure if it is due to prednisone use.  Patient reports that she has upcoming PFTs scheduled on 11/16/2020 along with an appointment with Dr. Vaughan Browner for further evaluation.  She states her shortness of breath has improved. She denies any coughing or new pulmonary symptoms.  She plans to quit smoking and has been working with Knox Saliva to try nicotine patches and gum.     Activities of Daily Living:  Patient reports morning stiffness for 0 minutes.   Patient Denies nocturnal pain.  Difficulty dressing/grooming: Denies Difficulty climbing stairs: Denies Difficulty getting out of chair: Denies Difficulty using hands for taps, buttons, cutlery, and/or writing: Denies  Review of Systems  Constitutional: Positive for fatigue.  HENT: Positive for mouth dryness and nose dryness. Negative for mouth sores.   Eyes: Negative for pain, itching and dryness.  Respiratory: Negative for shortness of breath and difficulty breathing.   Cardiovascular: Negative for chest pain and palpitations.   Gastrointestinal: Negative for blood in stool, constipation and diarrhea.  Endocrine: Negative for increased urination.  Genitourinary: Negative for difficulty urinating.  Musculoskeletal: Negative for arthralgias, joint pain, joint swelling, myalgias, morning stiffness, muscle tenderness and myalgias.  Skin: Negative for color change, rash, hair loss and redness.  Allergic/Immunologic: Negative for susceptible to infections.  Neurological: Negative for dizziness, numbness, headaches, memory loss and weakness.  Hematological: Negative for bruising/bleeding tendency.  Psychiatric/Behavioral: Negative for confusion.    PMFS History:  Patient Active Problem List   Diagnosis Date Noted  . Encounter for screening for lung cancer 12/29/2019  . Excessive sleepiness 09/08/2014  . Fibromyalgia syndrome 08/25/2014  . RA (rheumatoid arthritis) ([place])   . GERD (gastroesophageal reflux disease)   . Anxiety   . Depression   . History of shingles   . Synovitis of hand   . Cervical dystonia 12/04/2012    Past Medical History:  Diagnosis Date  . Anxiety   . Cervical dystonia   . Depression   . Fibromyalgia   . GERD (gastroesophageal reflux disease)   . History of shingles   . RA (rheumatoid arthritis) ([place])   . Synovitis of hand     Family History  Problem Relation Age of Onset  . Arthritis Mother   . Heart Problems Father   . Heart disease Father   . Heart disease Paternal Uncle        MI   Past Surgical History:  Procedure  Laterality Date  . TONSILLECTOMY    . 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 Right handed.    Caffeine- 4 cups daily.   Immunization History  Administered Date(s) Administered  . Influenza,inj,Quad PF,6+ Mos 05/25/2018, 04/03/2019, 04/26/2020  . Influenza-Unspecified 06/24/2014, 05/01/2015,  04/10/2016, 04/06/2017  . Moderna Sars-Covid-2 Vaccination 08/29/2019, 09/26/2019, 06/19/2020  . Pneumococcal Conjugate-13 05/01/2015  . Pneumococcal Polysaccharide-23 04/10/2016  . Tdap 09/09/2015     Objective: Vital Signs: BP 132/80 (BP Location: Left Arm, Patient Position: Sitting, Cuff Size: Normal)   Pulse 82   Resp 15   Ht 5\' 6"  (1.676 m)   Wt 178 lb (80.7 kg)   BMI 28.73 kg/m    Physical Exam Vitals and nursing note reviewed.  Constitutional:      Appearance: She is well-developed.  HENT:     Head: Normocephalic and atraumatic.  Eyes:     Conjunctiva/sclera: Conjunctivae normal.  Pulmonary:     Effort: Pulmonary effort is normal.  Abdominal:     Palpations: Abdomen is soft.  Musculoskeletal:     Cervical back: Normal range of motion.  Skin:    General: Skin is warm and dry.     Capillary Refill: Capillary refill takes less than 2 seconds.  Neurological:     Mental Status: She is alert and oriented to person, place, and time.  Psychiatric:        Behavior: Behavior normal.      Musculoskeletal Exam: C-spine, thoracic spine, lumbar spine have good range of motion with no discomfort.  Shoulder joints, elbow joints, wrist joints, MCPs, PIPs, DIPs have good range of motion with no synovitis.  She is able to make a complete fist bilaterally.  Hip joints have good range of motion with no discomfort.  No tenderness over trochanteric bursa bilaterally.  Knee joints have good range of motion with no warmth or effusion.  Ankle joints have good range of motion with no tenderness or swelling.   CDAI Exam: CDAI Score: 0.4  Patient Global: 2 mm; Provider Global: 2 mm Swollen: 0 ; Tender: 0  Joint Exam 11/02/2020   No joint exam has been documented for this visit   There is currently no information documented on the homunculus. Go to the Rheumatology activity and complete the homunculus joint exam.  Investigation: No additional findings.  Imaging: US THYROID  Result  Date: 10/23/2020 CLINICAL DATA:  Nodule seen on CT angiography EXAM: THYROID ULTRASOUND TECHNIQUE: Ultrasound examination of the thyroid gland and adjacent soft tissues was performed. COMPARISON:  None. FINDINGS: Parenchymal Echotexture: Mildly heterogeneous Isthmus: 0.3 cm Right lobe: 4.5 x 1.5 x 1.6 cm Left lobe: 5.2 x 2.0 x 1.9 cm _________________________________________________________ Estimated total number of nodules >/= 1 cm: 2 Number of spongiform nodules >/=  2 cm not described below (TR1): 0 Number of mixed cystic and solid nodules >/= 1.5 cm not described below (TR2): 0 _________________________________________________________ Nodule # 1: Location: Left; mid Maximum size: 1.8 cm; Other 2 dimensions: 1.6 x 1.3 cm Composition: solid/almost completely solid (2) Echogenicity: hypoechoic (2) Shape: not taller-than-wide (0) Margins: ill-defined (0) Echogenic foci: none (0) ACR TI-RADS total points: 4. ACR TI-RADS risk category: TR4 (4-6 points). ACR TI-RADS recommendations: **Given size (>/= 1.5 cm) and appearance, fine needle aspiration of this moderately suspicious nodule should be considered based on TI-RADS criteria. _________________________________________________________ Nodule #  2: Location: Left; inferior Maximum size: 2.4 cm; Other 2 dimensions: 1.7 x 1.5 cm Composition: spongiform (0) ACR TI-RADS recommendations: Does not meet criteria for FNA or imaging follow-up. _________________________________________________________ IMPRESSION: Nodule 1 (TI-RADS 4) located in the mid left thyroid lobe, measuring 1.8 x 1.6 x 1.3 cm, meets criteria for FNA. The above is in keeping with the ACR TI-RADS recommendations - J Am Coll Radiol 2017;14:587-595. Electronically Signed   By: Miachel Roux M.D.   On: 10/23/2020 09:35    Recent Labs: Lab Results  Component Value Date   WBC 13.1 (H) 09/08/2020   HGB 11.0 (L) 09/08/2020   PLT 284 09/08/2020   NA 136 09/08/2020   K 3.6 09/08/2020   CL 102 09/08/2020    CO2 25 09/08/2020   GLUCOSE 118 (H) 09/08/2020   BUN 11 09/08/2020   CREATININE 1.03 (H) 09/08/2020   BILITOT 0.4 08/02/2020   ALKPHOS 71 03/13/2019   AST 19 08/02/2020   ALT 16 08/02/2020   PROT 6.8 08/02/2020   ALBUMIN 3.6 03/13/2019   CALCIUM 9.1 09/08/2020   GFRAA 60 08/02/2020   QFTBGOLD Negative 09/14/2015   QFTBGOLDPLUS NEGATIVE 03/24/2020    Speciality Comments: Remicade 6mg /kg every 6 weeks 07/13-09/21 Rinvoq 15 mg qd 04/28/20 MTX - dcd due to increase in [place] started 06/21/20   Procedures:  No procedures performed Allergies: Patient has no known allergies.    Assessment / Plan:     Visit Diagnoses: Rheumatoid arthritis of multiple sites with negative rheumatoid factor ([place]): She has no joint tenderness or synovitis.  She is clinically doing well taking rinvoq 15 mg 1 tablet by mouth daily and arava 10 mg 1 tablet by mouth daily. She is tolerating both medications without any side effects. She remains on prednisone 10 mg by mouth daily which she has been taking for 1 month.  She is not experiencing any joint discomfort, morning stiffness, nocturnal pain, or difficulty with ADLs.  She was evaluated by Dr. Vaughan Browner on 09/23/20 for SOB and was scheduled for a high resolution chest CT on 09/29/20, which was consistent with inflammation related to smoking.  There was no evidence of ILD secondary to RA.  She has upcoming PFTs scheduled on 11/16/20.  Her shortness of breath has improved since starting to use anoro inhaler.  She plans on starting to use nicotine patches and gum for tobacco cessation.  She will remain on the current treatment regimen. We discussed starting to taper prednisone by 2.5 mg every 2 weeks since her rheumatoid arthritis is well controlled on the current regimen.  She plans to clarify with Dr. Vaughan Browner if she can start to taper prednisone prior her upcoming appointment due to previously experiencing increased SOB after trying to taper prednisone. She will follow up  with Dr. Estanislado Pandy in 3 months.  High risk medication use - Rinvoq 15 mg 1 tablet by mouth daily (started 04/28/20) and Arava 10 mg 1 tablet by mouth daily (started 06/21/20).  Inadequate response to Remicade 6mg /kg. TB gold negative on 03/24/2020.  CBC and BMP updated on 09/18/2020.  CBC and CMP will be updated today and every 3 months.  Standing orders for CBC and CMP are in place.  Lipid panel updated on 06/14/20.  - Plan: CBC with Differential/Platelet, COMPLETE METABOLIC PANEL WITH GFR She has not had any recent infections.   Shortness of breath - She was evaluated by Dr. Vaughan Browner on 09/23/20.  She had a high resolution chest CT performed on 09/29/20 which  was consistent with smoking related bronchiolitis.  Dr. Vaughan Browner reported there was no evidence of ILD associated with RA. She is scheduled for upcoming PFTs and a follow up visit with Dr. Vaughan Browner on 11/16/20.  Her shortness of breath has improved. She denies any coughing or new pulmonary symptoms. She has noticed an improvement in her symptoms since starting to use anoro inhaler. She plans on quitting smoking and will be starting to use nicotine patches and gum. She has an upcoming virtual visit with Knox Saliva for tobacco cessation counseling.  Discussed starting to taper prednisone but she would like to clarify with Dr. Vaughan Browner if he would like her to remain on prednisone until her follow up visit on 11/16/20.   Sicca complex ([place]): Mild mouth and nose dryness.  She uses xylimelts PRN for symptomatic relief.   Fibromyalgia: She hs not had any recent fibromyalgia flares.  She is not experiencing any myalgia, muscle tenderness, or tender points at this time.  Her fatigue has improved.  She continues to have interrupted sleep at night but has been waking up feeling rested.  Discussed the importance of regular exercise and good sleep hygiene. She will remain on cymbalta as prescribed.    Chronic fatigue: Improved.  She has been walking on a daily basis for  exercise.   Primary insomnia: She continues to have interrupted sleep at night.  She has not been experiencing any nocturnal pain.   Other medical conditions are listed as follows:   History of gastroesophageal reflux (GERD)  Recurrent UTI  Anxiety and depression  Orders: Orders Placed This Encounter  Procedures  . CBC with Differential/Platelet  . COMPLETE METABOLIC PANEL WITH GFR   No orders of the defined types were placed in this encounter.    Follow-Up Instructions: Return in about 3 months (around 02/02/2021) for Rheumatoid arthritis, Fibromyalgia.   Ofilia Neas, PA-C  Note - This record has been created using Dragon software.  Chart creation errors have been sought, but may not always  have been located. Such creation errors do not reflect on  the standard of medical care.

## 2020-10-22 ENCOUNTER — Ambulatory Visit
Admission: RE | Admit: 2020-10-22 | Discharge: 2020-10-22 | Disposition: A | Discharge status: Home / Self Care | Payer: 59 | Source: Ambulatory Visit | Attending: Family Medicine | Admitting: Family Medicine

## 2020-10-22 DIAGNOSIS — E041 Nontoxic single thyroid nodule: Secondary | ICD-10-CM

## 2020-10-27 ENCOUNTER — Other Ambulatory Visit: Payer: Self-pay | Admitting: Physician Assistant

## 2020-10-27 ENCOUNTER — Other Ambulatory Visit: Payer: Self-pay | Admitting: Rheumatology

## 2020-10-27 ENCOUNTER — Encounter: Payer: Self-pay | Admitting: Family Medicine

## 2020-10-27 NOTE — Telephone Encounter (Signed)
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

## 2020-10-27 NOTE — Telephone Encounter (Signed)
Next Visit: 11/02/2020  Last Visit: 09/17/2020,   Last Fill: 08/07/2020  DX: Rheumatoid arthritis of multiple sites with negative rheumatoid factor   Current Dose per office note 09/17/2020,  Arava 10 mg 1 tablet by mouth daily   Labs: 09/08/2020, WBC 13.1, RBC 3.70, Hemoglobin 11.0, HCT 34.1, Glucose 118, Creatinine 1.03,   Okay to refill Arava?

## 2020-10-28 ENCOUNTER — Other Ambulatory Visit: Payer: Self-pay | Admitting: *Deleted

## 2020-10-28 DIAGNOSIS — E041 Nontoxic single thyroid nodule: Secondary | ICD-10-CM

## 2020-10-31 DIAGNOSIS — J449 Chronic obstructive pulmonary disease, unspecified: Secondary | ICD-10-CM

## 2020-10-31 HISTORY — DX: Chronic obstructive pulmonary disease, unspecified: J44.9

## 2020-11-02 ENCOUNTER — Other Ambulatory Visit: Payer: Self-pay

## 2020-11-02 ENCOUNTER — Encounter: Payer: Self-pay | Admitting: Physician Assistant

## 2020-11-02 ENCOUNTER — Other Ambulatory Visit: Payer: 59 | Admitting: Pharmacist

## 2020-11-02 ENCOUNTER — Ambulatory Visit: Payer: 59 | Admitting: Physician Assistant

## 2020-11-02 VITALS — BP 132/80 | HR 82 | Resp 15 | Ht 66.0 in | Wt 178.0 lb

## 2020-11-02 DIAGNOSIS — M3500 Sicca syndrome, unspecified: Secondary | ICD-10-CM

## 2020-11-02 DIAGNOSIS — Z8719 Personal history of other diseases of the digestive system: Secondary | ICD-10-CM

## 2020-11-02 DIAGNOSIS — F419 Anxiety disorder, unspecified: Secondary | ICD-10-CM

## 2020-11-02 DIAGNOSIS — M0609 Rheumatoid arthritis without rheumatoid factor, multiple sites: Secondary | ICD-10-CM

## 2020-11-02 DIAGNOSIS — F32A Depression, unspecified: Secondary | ICD-10-CM

## 2020-11-02 DIAGNOSIS — R0602 Shortness of breath: Secondary | ICD-10-CM

## 2020-11-02 DIAGNOSIS — N390 Urinary tract infection, site not specified: Secondary | ICD-10-CM

## 2020-11-02 DIAGNOSIS — R5382 Chronic fatigue, unspecified: Secondary | ICD-10-CM

## 2020-11-02 DIAGNOSIS — Z79899 Other long term (current) drug therapy: Secondary | ICD-10-CM | POA: Diagnosis not present

## 2020-11-02 DIAGNOSIS — F5101 Primary insomnia: Secondary | ICD-10-CM

## 2020-11-02 DIAGNOSIS — M797 Fibromyalgia: Secondary | ICD-10-CM

## 2020-11-02 LAB — CBC WITH DIFFERENTIAL/PLATELET
Absolute Monocytes: 667 cells/uL (ref 200–950)
Basophils Absolute: 104 cells/uL (ref 0–200)
Basophils Relative: 0.9 %
Eosinophils Absolute: 207 cells/uL (ref 15–500)
Eosinophils Relative: 1.8 %
HCT: 38.2 % (ref 35.0–45.0)
Hemoglobin: 12.3 g/dL (ref 11.7–15.5)
Lymphs Abs: 2346 cells/uL (ref 850–3900)
MCH: 29.1 pg (ref 27.0–33.0)
MCHC: 32.2 g/dL (ref 32.0–36.0)
MCV: 90.5 fL (ref 80.0–100.0)
MPV: 11.1 fL (ref 7.5–12.5)
Monocytes Relative: 5.8 %
Neutro Abs: 8177 cells/uL — ABNORMAL HIGH (ref 1500–7800)
Neutrophils Relative %: 71.1 %
Platelets: 277 10*3/uL (ref 140–400)
RBC: 4.22 10*6/uL (ref 3.80–5.10)
RDW: 13.5 % (ref 11.0–15.0)
Total Lymphocyte: 20.4 %
WBC: 11.5 10*3/uL — ABNORMAL HIGH (ref 3.8–10.8)

## 2020-11-02 LAB — COMPLETE METABOLIC PANEL WITH GFR
AG Ratio: 1.6 (calc) (ref 1.0–2.5)
ALT: 16 U/L (ref 6–29)
AST: 17 U/L (ref 10–35)
Albumin: 3.9 g/dL (ref 3.6–5.1)
Alkaline phosphatase (APISO): 76 U/L (ref 37–153)
BUN/Creatinine Ratio: 13 (calc) (ref 6–22)
BUN: 15 mg/dL (ref 7–25)
CO2: 29 mmol/L (ref 20–32)
Calcium: 9.2 mg/dL (ref 8.6–10.4)
Chloride: 104 mmol/L (ref 98–110)
Creat: 1.12 mg/dL — ABNORMAL HIGH (ref 0.50–0.99)
GFR, Est African American: 61 mL/min/{1.73_m2} (ref >=60)
GFR, Est Non African American: 53 mL/min/{1.73_m2} — ABNORMAL LOW (ref >=60)
Globulin: 2.4 g/dL (calc) (ref 1.9–3.7)
Glucose, Bld: 87 mg/dL (ref 65–99)
Potassium: 4.1 mmol/L (ref 3.5–5.3)
Sodium: 140 mmol/L (ref 135–146)
Total Bilirubin: 0.3 mg/dL (ref 0.2–1.2)
Total Protein: 6.3 g/dL (ref 6.1–8.1)

## 2020-11-02 NOTE — Telephone Encounter (Signed)
Please advise on patient mychart message  Good morning I saw Courtney Myers this AM for Rheumatology follow up.  They will refill Prednisone but wanted your feedback also.  I have been taking 10 mgs for the last 30 days, doing great.  2 pills left though.   Please advise. Thank you  Tereasa Coop

## 2020-11-02 NOTE — Telephone Encounter (Signed)
I have reviewed the note.  Agree with the prednisone taper as suggested by rheumatology I will reevaluate when you return to clinic later this month.

## 2020-11-02 NOTE — Progress Notes (Incomplete)
Subjective Ms. Randle called today by [facility] pharmacy team for smoking cessation counseling.    I met with Ms. Palka on 10/12/20 and we decided on pursuing nicotine patch + gum. Ms. Devera saw Hazel Sams, Hershal Coria, at [facility] this morning, and we briefly discussed the plan  Patient started nicotine patches 10/01/20 and then stopped on 10/07/20. She does not have a history of seizures, but takes clonazepam twice daily.  Ms. Camba and her husband both smoke - he is also interested in quitting smoking and plans to establish care at [facility] and potentially connect for smoking cessation.   Her triggers include driving and generally being outside.  She states that since being placed on inhalers at pulm clinic, she's wanted to quit smoking. States that when her workplace became smoke-free, it helped cut down on smoking  Patient's serious quit attempts include: 1. In the 1990s - quit cold [place] and quit for 6 years 2. Patches + lozenge - failed to quit 3. Patches + lozenge - failed to quit   Social History   Tobacco Use  Smoking Status Current Every Day Smoker  . Packs/day: 1.00  . Years: 34.00  . Pack years: 34.00  . Types: Cigarettes  . Start date: 1979  Smokeless Tobacco Never Used     Tobacco Use History  Age when started using tobacco on a daily basis 17  Type: cigarettes.  Number of cigarettes per day 1 ppd (usually less than 1 pack ), brand Marlboro Lights.  Smokes first cigarette 3-4 minutes after waking.  Does not wake at night to smoke  Triggers include: driving, being outside (gardening), sitting on porch  Quit Attempt History   Most recent quit attempt: 3 serious quit attempts  Longest time ever been tobacco free 6 years in the 1990s  Methods tried in the past include Nicotine Inhaler, Nicotine patch.   Rates IMPORTANCE of quitting tobacco on 1-10 scale of 10  Rates READINESS of quitting tobacco on 1-10 scale of  10.  Rates CONFIDENCE of quitting; barriers include spouse/partner smokes     Immunization History  Administered Date(s) Administered  . Influenza,inj,Quad PF,6+ Mos 05/25/2018, 04/03/2019, 04/26/2020  . Influenza-Unspecified 06/24/2014, 05/01/2015, 04/10/2016, 04/06/2017  . Moderna Sars-Covid-2 Vaccination 08/29/2019, 09/26/2019, 06/19/2020  . Pneumococcal Conjugate-13 05/01/2015  . Pneumococcal Polysaccharide-23 04/10/2016  . Tdap 09/09/2015     Assessment and Plan  1. Smoking Cessation   Patient states she are ready to quit smoking.  Patient set quit date of tomorrow when she starts patch. She will start daily patch along with nicotine gum.  Discussed smoking cessation agent Chantix, Wellbutrin, and nicotine replacement therapies.  Patient is agreeable to starting patch with nicotine gum for now.  Nicotine Patch  Patient counseled on purpose, proper use, and potential adverse effects, including mild itching or redness at the point of application, headache, trouble sleeping, and/or vivid dreams    Patch Schedule for >10 cigarettes daily Weeks 1-6: one 21 mg patch daily Weeks 7-8: one 14 mg patch daily Weeks 9-10: one 7 mg patch daily  Nicotine Gum Patient counseled on purpose, proper use, and potential adverse effects including jaw soreness and upset stomach if swallowing saliva.  Instructed patient to use 4 mg if they smoke a pack a day.  Gum dosing schedule Weeks 1 to 6: Chew 1 piece of gum every 1 to 2 hours (maximum: 24 pieces/day); to increase chances of quitting, chew at least 9 pieces/day during the first 6  weeks Weeks 7 to 9: Chew 1 piece of gum every 2 to 4 hours (maximum: 24 pieces/day) Weeks 10 to 12: Chew 1 piece of gum every 4 to 8 hours (maximum: 24 pieces/day)  Provided information on 1 800-QUIT NOW support program.   2. Medication Reconciliation A drug regimen assessment was performed, including review of allergies, interactions, disease-state management,  dosing and immunization history. Medications were reviewed with the patient, including name, instructions, indication, goals of therapy, potential side effects, importance of adherence, and safe use.  Cost is not an issue for her. I sent prescription to pharmacy but patient advised that it may not be covered by her insurance. She verbalized understanding.   Phone call f/u scheduled with pharmacy team on    Knox Saliva, PharmD, MPH Clinical Pharmacist (Rheumatology and Pulmonology)

## 2020-11-02 NOTE — Patient Instructions (Signed)
Standing Labs We placed an order today for your standing lab work.   Please have your standing labs drawn in August and every 3 months   If possible, please have your labs drawn 2 weeks prior to your appointment so that the provider can discuss your results at your appointment.  We have open lab daily Monday through Thursday from 1:30-4:30 PM and Friday from 1:30-4:00 PM at the office of Dr. Shaili Deveshwar, [facility].   Please be advised, all patients with office appointments requiring lab work will take precedents over walk-in lab work.  If possible, please come for your lab work on Monday and Friday afternoons, as you may experience shorter wait times. The office is located at [address], [address], [place], [place] [address] No appointment is necessary.   Labs are drawn by [facility]. Please bring your co-pay at the time of your lab draw.  You may receive a bill from [facility] for your lab work.  If you wish to have your labs drawn at another location, please call the office 24 hours in advance to send orders.  If you have any questions regarding directions or hours of operation,  please call [phone].   As a reminder, please drink plenty of water prior to coming for your lab work. Thanks!   

## 2020-11-03 ENCOUNTER — Other Ambulatory Visit: Payer: Self-pay

## 2020-11-03 MED ORDER — PREDNISONE 5 MG PO TABS
ORAL_TABLET | ORAL | 0 refills | Status: DC
Start: 2020-11-03 — End: 2021-03-29

## 2020-11-03 NOTE — Progress Notes (Signed)
WBC count is slightly elevated likely due to prednisone use.   Creatinine is elevated-1.12 and GFR is slightly low-53. Please advise the patient to avoid all NSAIDs.     Upon chart review, Dr. Vaughan Browner agrees with starting to taper prednisone.  She will taper by 2.5 mg every 2 weeks.   Please send in a new prescription for prednisone 7.5 mg daily x2 weeks then reduce to 5 mg x 2 weeks.

## 2020-11-03 NOTE — Progress Notes (Signed)
Please review prednisone taper you advised and send to the pharmacy. Thanks!

## 2020-11-04 ENCOUNTER — Encounter: Payer: Self-pay | Admitting: *Deleted

## 2020-11-10 ENCOUNTER — Ambulatory Visit: Payer: 59 | Admitting: Physician Assistant

## 2020-11-10 ENCOUNTER — Ambulatory Visit: Payer: 59 | Admitting: Rheumatology

## 2020-11-12 ENCOUNTER — Other Ambulatory Visit (HOSPITAL_COMMUNITY)
Admission: RE | Admit: 2020-11-12 | Discharge: 2020-11-12 | Disposition: A | Discharge status: Home / Self Care | Payer: 59 | Source: Ambulatory Visit | Attending: Pulmonary Disease | Admitting: Pulmonary Disease

## 2020-11-12 DIAGNOSIS — Z01812 Encounter for preprocedural laboratory examination: Secondary | ICD-10-CM | POA: Insufficient documentation

## 2020-11-12 DIAGNOSIS — Z20822 Contact with and (suspected) exposure to covid-19: Secondary | ICD-10-CM | POA: Insufficient documentation

## 2020-11-13 LAB — SARS CORONAVIRUS 2 (TAT 6-24 HRS): SARS Coronavirus 2: NEGATIVE

## 2020-11-16 ENCOUNTER — Ambulatory Visit: Payer: 59 | Admitting: Pulmonary Disease

## 2020-11-16 ENCOUNTER — Ambulatory Visit (INDEPENDENT_AMBULATORY_CARE_PROVIDER_SITE_OTHER): Payer: 59 | Admitting: Pulmonary Disease

## 2020-11-16 ENCOUNTER — Encounter: Payer: Self-pay | Admitting: *Deleted

## 2020-11-16 ENCOUNTER — Other Ambulatory Visit: Payer: Self-pay

## 2020-11-16 VITALS — BP 136/80 | HR 98 | Temp 97.8°F | Ht 66.0 in | Wt 176.0 lb

## 2020-11-16 DIAGNOSIS — F17210 Nicotine dependence, cigarettes, uncomplicated: Secondary | ICD-10-CM

## 2020-11-16 DIAGNOSIS — J449 Chronic obstructive pulmonary disease, unspecified: Secondary | ICD-10-CM

## 2020-11-16 DIAGNOSIS — J849 Interstitial pulmonary disease, unspecified: Secondary | ICD-10-CM

## 2020-11-16 LAB — PULMONARY FUNCTION TEST
DL/VA % pred: 97 %
DL/VA: 4.03 ml/min/mmHg/L
DLCO cor % pred: 73 %
DLCO cor: 15.79 ml/min/mmHg
DLCO unc % pred: 70 %
DLCO unc: 15.23 ml/min/mmHg
FEF 25-75 Post: 0.44 L/sec
FEF 25-75 Pre: 0.89 L/sec
FEF2575-%Change-Post: -50 %
FEF2575-%Pred-Post: 18 %
FEF2575-%Pred-Pre: 37 %
FEV1-%Change-Post: -36 %
FEV1-%Pred-Post: 37 %
FEV1-%Pred-Pre: 59 %
FEV1-Post: 1.02 L
FEV1-Pre: 1.61 L
FEV1FVC-%Change-Post: -28 %
FEV1FVC-%Pred-Pre: 80 %
FEV6-%Change-Post: -8 %
FEV6-%Pred-Post: 67 %
FEV6-%Pred-Pre: 74 %
FEV6-Post: 2.29 L
FEV6-Pre: 2.51 L
FEV6FVC-%Change-Post: 0 %
FEV6FVC-%Pred-Post: 103 %
FEV6FVC-%Pred-Pre: 103 %
FVC-%Change-Post: -11 %
FVC-%Pred-Post: 65 %
FVC-%Pred-Pre: 73 %
FVC-Post: 2.29 L
FVC-Pre: 2.58 L
Post FEV1/FVC ratio: 45 %
Post FEV6/FVC ratio: 100 %
Pre FEV1/FVC ratio: 63 %
Pre FEV6/FVC Ratio: 100 %
RV % pred: 95 %
RV: 2.03 L
TLC % pred: 84 %
TLC: 4.51 L

## 2020-11-16 MED ORDER — TRELEGY ELLIPTA 200-62.5-25 MCG/INH IN AEPB: 1.0000 | INHALATION_SPRAY | Freq: Every day | RESPIRATORY_TRACT | 2 refills | Status: DC

## 2020-11-16 MED ORDER — TRELEGY ELLIPTA 200-62.5-25 MCG/INH IN AEPB: 1.0000 | INHALATION_SPRAY | Freq: Every day | RESPIRATORY_TRACT | 0 refills | Status: DC

## 2020-11-16 NOTE — Progress Notes (Signed)
Courtney Myers    [id number]    11/14/1960  Primary Care Physician:Pickard, Cammie Mcgee, MD  Referring Physician: Susy Frizzle, MD [address] [place],  [place] [address]  Chief complaint: Follow-up for COPD, chest pain  HPI: 60 year old active smoker fibromyalgia, GERD, rheumatoid arthritis She is followed for seronegative arthritis by Dr. Estanislado Pandy and is well controlled on Rinvoq and [place].  Complains of recurrent reproducible chest pain since November 2021.  She is seen in the ER in November 2021 and March 2022 for similar complaints and this was thought to be secondary to costochondritis She had a CT angiogram in December 2022 which did not show any PE.  There are some subtle changes suggestive of interstitial lung disease.  She has been recently started on prednisone with improvement in symptoms.  However she is having insomnia and mood changes from the steroids  Reports intermittent episodes of sternal and back pain, chest tightness.  Denies any cough, sputum production  Pets: 2 dogs Occupation: Web designer Exposures: No mold, hot tub, Jacuzzi.  No feather pillows or comforters Smoking history: 40-pack-year smoker.  Continues to smoke 1 pack/day Travel history: No significant travel history Relevant family history: No family history of lung disease  Interim history: Here for review of PFTs and high-res CT Continues to smoke but is trying to quit  She is now on chronic prednisone for fibromyalgia pain which is helping with the chest pain  Outpatient Encounter Medications as of 11/16/2020  Medication Sig  . aspirin EC 81 MG tablet Take 1 tablet (81 mg total) by mouth daily. Swallow whole.  . baclofen (LIORESAL) 10 MG tablet Take 10 mg by mouth daily as needed for muscle spasms.   . clonazePAM (KLONOPIN) 0.5 MG tablet Take 1 tablet (0.5 mg total) by mouth 2 (two) times daily as needed. #60 must last 30 days. (Patient taking  differently: Take 0.5 mg by mouth 2 (two) times daily. #60 must last 30 days.)  . CRANBERRY PO Take 2 capsules by mouth in the morning and at bedtime.   . DULoxetine (CYMBALTA) 60 MG capsule TAKE 1 CAPSULE(60 MG) BY MOUTH DAILY  . DYSPORT 500 units SOLR injection INJECT 500 UNITS  INTRAMUSCULARLY EVERY 3  MONTHS (GIVEN AT MD OFFICE, DISCARD UNUSED)  . leflunomide (ARAVA) 10 MG tablet TAKE 1 TABLET(10 MG) BY MOUTH DAILY  . Multiple Vitamins-Minerals (MULTIVITAMINS THER. W/MINERALS) TABS Take 1 tablet by mouth at bedtime.   . nicotine (NICODERM CQ - DOSED IN MG/24 HOURS) 21 mg/24hr patch Place 1 patch (21 mg total) onto the skin daily.  . nicotine polacrilex (NICORETTE) 4 MG gum Chew and park 1 piece of gum every 1 to 2 hours until tingling gone. Use at least 9 pieces daily. Maximum 24 pieces daily.  Marland Kitchen omeprazole (PRILOSEC) 20 MG capsule TAKE 1 CAPSULE BY MOUTH  EVERY DAY  . predniSONE (DELTASONE) 5 MG tablet Take 1.5 tabs po qd x 2 weeks, then take 1 tab po qd x 2 weeks, then take 0.5 tab po qd x 2 weeks. (Patient taking differently: Taking 7.5 mg daily)  . RINVOQ 15 MG TB24 TAKE 1 TABLET BY MOUTH  DAILY  . rosuvastatin (CRESTOR) 20 MG tablet Take 1 tablet (20 mg total) by mouth daily.  . traMADol (ULTRAM) 50 MG tablet Take 1 - 2 every 8 hours as needed for pain.  Marland Kitchen umeclidinium-vilanterol (ANORO ELLIPTA) 62.5-25 MCG/INH AEPB Inhale 1 puff into  the lungs daily.  . Xylitol (XYLIMELTS MT) Use as directed 2 tablets in the mouth or throat at bedtime as needed (dry mouth).    No facility-administered encounter medications on file as of 11/16/2020.   Physical Exam: Blood pressure 136/80, pulse 98, temperature 97.8 F (36.6 C), temperature source Temporal, height 5\' 6"  (1.676 m), weight 176 lb (79.8 kg), SpO2 96 %. Gen:      No acute distress HEENT:  EOMI, sclera anicteric Neck:     No masses; no thyromegaly Lungs:    Clear to auscultation bilaterally; normal respiratory effort CV:         Regular  rate and rhythm; no murmurs Abd:      + bowel sounds; soft, non-tender; no palpable masses, no distension Ext:    No edema; adequate peripheral perfusion Skin:      Warm and dry; no rash Neuro: alert and oriented x 3 Psych: normal mood and affect  Data Reviewed: Imaging: CTA 06/16/2020-no pulmonary embolism, patchy peripheral opacities consistent with chronic interstitial lung disease, stable right lung nodules, emphysema.   High-res CT 09/29/2020- mild centrilobular nodularity in the upper lobes, 1 vessel coronary atherosclerosis, thyroid nodule, emphysema I have reviewed the images personally  PFTs: 11/16/2020 FVC 2.58 [73%], FEV1 1.61 [59%], F/F 63, TLC 4.51 [84%], DLCO 15.23 [90%] Moderate to severe obstruction, mild diffusion defect  Labs: CBC 08/02/2020-WBC 9.8, eos 0.4%, absolute eosinophil count 39 CBC 11/02/2020-WBC 11.5, eos 1.8%, absolute eosinophil count 207  Assessment:  COPD PFTs reviewed with moderate-severe obstruction.  She is currently on anoro We will switch to Trelegy as peripheral eosinophils are slightly elevated and she may benefit from inhaled corticosteroid  Atypical chest pain Her chest pain appears is consistent with costochondritis, perhaps related to her rheumatoid arthritis or fibromyalgia.  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  There is no evidence of connective tissue disease related ILD from rheumatoid arthritis She will need to quit smoking and have a follow-up CT high-resolution to document resolution  Active smoker Discussed smoking cessation in detail She is currently on nicotine patches.  Does not want Chantix due to side effects in the past Time spent counseling-5 minutes.  Reassess at return visit  Continue annual CT follow-up  Thyroid nodule Noted on CT chest and ultrasound of the thyroid.  She is going to get fine-needle aspiration  to be scheduled by primary care  Plan/Recommendations: Stop anoro, start Trelegy Work on smoking cessation  Follow-up in 4 months  Marshell Garfinkel MD [facility] 11/16/2020, 11:16 AM  CC: Susy Frizzle, MD

## 2020-11-16 NOTE — Patient Instructions (Signed)
Full PFT performed today. °

## 2020-11-16 NOTE — Patient Instructions (Addendum)
I have reviewed your PFTs which showed COPD CT shows inflammation likely secondary to smoking Please continue to work on quitting smoking  We will stop the Anoro and start Trelegy   Follow-up in 4 months

## 2020-11-16 NOTE — Progress Notes (Signed)
Full PFT performed today. °

## 2020-11-16 NOTE — Addendum Note (Signed)
Addended by: Elton Sin on: 11/16/2020 11:43 AM   Modules accepted: Orders

## 2020-11-16 NOTE — Progress Notes (Signed)
Cardiology Office Note:    Date:  11/18/2020   ID:  Ward Chatters, DOB 20-Mar-1961, MRN [id number]  PCP:  Susy Frizzle, MD  Cardiologist:  Buford Dresser, MD  Referring MD: Susy Frizzle, MD   Chief Complaint  Patient presents with  . Follow-up  CC: Follow-up  History of Present Illness:    Courtney Myers is a 60 y.o. female with a a hx of rheumatoid arthritis, chest pain who was initially seen 12/15/2021as a new consult at the request of Susy Frizzle, MD for the evaluation and management of cardiovascular risk. She presents today for follow-up.  Today: She is accompanied by her husband. She has since quit smoking this past Tuesday 11/16/2020, and is using nicotine patches and gum. She was motivated by her poor performance on her recent pulmonary function tests. Since March she had chest and abdominal pain upon inspiration, nonexertional.  Lately, she has been feeling "Hilaria Ota" when she wakes up in the morning every day. She is feeling heart "flutters" at these times. Typically has a duration of 20-30 minutes, but does not limit her activity. She is wondering if her current thyroid issues are related to her heart flutters.  Since beginning the prednisone she has noticed some improvement in her chronic chest pain. She remains compliant and continues to tolerate all of her medications. She is followed closely by Dr. Vaughan Browner in pulmonology.  She denies any exertional symptoms. No headaches, lightheadedness, or syncope to report. Also has no lower extremity edema, orthopnea or PND.   Past Medical History:  Diagnosis Date  . Anxiety   . Cervical dystonia   . Depression   . Fibromyalgia   . GERD (gastroesophageal reflux disease)   . History of shingles   . RA (rheumatoid arthritis) ([place])   . Synovitis of hand     Past Surgical History:  Procedure Laterality Date  . TONSILLECTOMY    . VAGINAL HYSTERECTOMY  3/08    Current  Medications: Current Outpatient Medications on File Prior to Visit  Medication Sig  . aspirin EC 81 MG tablet Take 1 tablet (81 mg total) by mouth daily. Swallow whole.  . baclofen (LIORESAL) 10 MG tablet Take 10 mg by mouth daily as needed for muscle spasms.   . clonazePAM (KLONOPIN) 0.5 MG tablet Take 1 tablet (0.5 mg total) by mouth 2 (two) times daily as needed. #60 must last 30 days. (Patient taking differently: Take 0.5 mg by mouth 2 (two) times daily. #60 must last 30 days.)  . CRANBERRY PO Take 2 capsules by mouth in the morning and at bedtime.   . DULoxetine (CYMBALTA) 60 MG capsule TAKE 1 CAPSULE(60 MG) BY MOUTH DAILY  . DYSPORT 500 units SOLR injection INJECT 500 UNITS  INTRAMUSCULARLY EVERY 3  MONTHS (GIVEN AT MD OFFICE, DISCARD UNUSED)  . Fluticasone-Umeclidin-Vilant (TRELEGY ELLIPTA) 200-62.5-25 MCG/INH AEPB Inhale 1 puff into the lungs daily.  Marland Kitchen leflunomide (ARAVA) 10 MG tablet TAKE 1 TABLET(10 MG) BY MOUTH DAILY  . Multiple Vitamins-Minerals (MULTIVITAMINS THER. W/MINERALS) TABS Take 1 tablet by mouth at bedtime.   . nicotine (NICODERM CQ - DOSED IN MG/24 HOURS) 21 mg/24hr patch Place 1 patch (21 mg total) onto the skin daily.  . nicotine polacrilex (NICORETTE) 4 MG gum Chew and park 1 piece of gum every 1 to 2 hours until tingling gone. Use at least 9 pieces daily. Maximum 24 pieces daily.  Marland Kitchen omeprazole (PRILOSEC) 20 MG capsule TAKE 1 CAPSULE BY MOUTH  EVERY  DAY  . predniSONE (DELTASONE) 5 MG tablet Take 1.5 tabs po qd x 2 weeks, then take 1 tab po qd x 2 weeks, then take 0.5 tab po qd x 2 weeks. (Patient taking differently: Taking 7.5 mg daily)  . RINVOQ 15 MG TB24 TAKE 1 TABLET BY MOUTH  DAILY  . rosuvastatin (CRESTOR) 20 MG tablet Take 1 tablet (20 mg total) by mouth daily.  . traMADol (ULTRAM) 50 MG tablet Take 1 - 2 every 8 hours as needed for pain.  . Xylitol (XYLIMELTS MT) Use as directed 2 tablets in the mouth or throat at bedtime as needed (dry mouth).    No current  facility-administered medications on file prior to visit.     Allergies:   Patient has no known allergies.   Social History   Tobacco Use  . Smoking status: Former Smoker    Packs/day: 0.25    Years: 34.00    Pack years: 8.50    Types: Cigarettes    Start date: 1979    Quit date: 11/16/2020  . Smokeless tobacco: Never Used  . Tobacco comment: 5-6 cigs per day/11/16/20  Vaping Use  . Vaping Use: Never used  Substance Use Topics  . Alcohol use: Yes    Alcohol/week: 0.0 standard drinks    Comment: Rare, twice per year  . Drug use: Never    Family History: family history includes Arthritis in her mother; Heart Problems in her father; Heart disease in her father and paternal uncle.  ROS:   Please see the history of present illness. (+) Shortness of Breath (+) Heart "flutters," "quivery" feeling All other systems are reviewed and negative.    EKGs/Labs/Other Studies Reviewed:    The following studies were reviewed today: No prior cardiac studies available CT noncon 12/29/19 noted mild aortic atherosclerosis and LAD coronary calcium.  EKG:  ECG personally reviewed today 11/18/2020: EKG is not ordered today 09/08/2020: Sinus tachycardia at 101 bpm, borderline RAE  Recent Labs: 11/02/2020: ALT 16; BUN 15; Creat 1.12; Hemoglobin 12.3; Platelets 277; Potassium 4.1; Sodium 140  Recent Lipid Panel    Component Value Date/Time   CHOL 237 (H) 06/14/2020 0812   TRIG 131 06/14/2020 0812   HDL 52 06/14/2020 0812   CHOLHDL 4.6 06/14/2020 0812   VLDL 14 09/09/2015 0826   LDLCALC 159 (H) 06/14/2020 0812    Physical Exam:    VS:  BP 116/80 (BP Location: Left Arm, Patient Position: Sitting, Cuff Size: Normal)   Pulse 82   Ht 5\' 6"  (1.676 m)   Wt 181 lb (82.1 kg)   BMI 29.21 kg/m     Wt Readings from Last 3 Encounters:  11/18/20 181 lb (82.1 kg)  11/16/20 176 lb (79.8 kg)  11/02/20 178 lb (80.7 kg)    GEN: Well nourished, well developed in no acute distress HEENT: Normal,  moist mucous membranes NECK: No JVD appreciated CARDIAC: distant heart sounds, regular rhythm, normal S1 and S2, no rubs or gallops. No murmur. VASCULAR: Radial and DP pulses 2+ bilaterally. No carotid bruits RESPIRATORY:  Distant breath sounds, but clear to auscultation without rales, wheezing or rhonchi  ABDOMEN: Soft, non-tender, non-distended MUSCULOSKELETAL:  Ambulates independently SKIN: Warm and dry, no edema NEUROLOGIC:  Alert and oriented x 3. No focal neuro deficits noted. PSYCHIATRIC:  Normal affect    ASSESSMENT:    1. Family history of heart disease   2. Medication management   3. Coronary artery calcification seen on CT scan   4.  Aortic atherosclerosis (HCC)   5. Cardiac risk counseling   6. Atypical chest pain    PLAN:    Family history of heart disease (father's side) Long term inflammatory disease (rheumatoid arthritis) Prior tobacco use Aortic atherosclerosis Coronary calcium: -no anginal chest pain, does have atypical chest pain/chronic costochondritis (improves with prednisone) -tolerating aspirin/statin. Due for recheck labs today -counseled on red flag warning signs that need immediate medical attention  Cardiac risk counseling and prevention recommendations: -recommend heart healthy/Mediterranean diet, with whole grains, fruits, vegetable, fish, lean meats, nuts, and olive oil. Limit salt. -recommend moderate walking, 3-5 times/week for 30-50 minutes each session. Aim for at least 150 minutes.week. Goal should be pace of 3 miles/hours, or walking 1.5 miles in 30 minutes -recommend avoidance of tobacco products. Avoid excess alcohol. The 10-year ASCVD risk score Mikey Bussing DC Brooke Bonito., et al., 2013) is: 3.9%   Values used to calculate the score:     Age: 38 years     Sex: Female     Is Non-Hispanic African American: No     Diabetic: No     Tobacco smoker: No     Systolic Blood Pressure: 254 mmHg     Is BP treated: No     HDL Cholesterol: 52 mg/dL     Total  Cholesterol: 237 mg/dL    Plan for follow up: 1 year or sooner as needed.  Buford Dresser, MD, PhD, [facility]    Medication Adjustments/Labs and Tests Ordered: Current medicines are reviewed at length with the patient today.  Concerns regarding medicines are outlined above.  Orders Placed This Encounter  Procedures  . Hepatic function panel  . Lipid panel   No orders of the defined types were placed in this encounter.   Patient Instructions  Medication Instructions:  Your Physician recommend you continue on your current medication as directed.    *If you need a refill on your cardiac medications before your next appointment, please call your pharmacy*   Lab Work: Your physician recommends lab work today (Lipid, LFT).    Testing/Procedures: None   Follow-Up: At [facility], you and your health needs are our priority.  As part of our continuing mission to provide you with exceptional heart care, we have created designated Provider Care Teams.  These Care Teams include your primary Cardiologist (physician) and Advanced Practice Providers (APPs -  Physician Assistants and Nurse Practitioners) who all work together to provide you with the care you need, when you need it.  We recommend signing up for the patient portal called "MyChart".  Sign up information is provided on this After Visit Summary.  MyChart is used to connect with patients for Virtual Visits (Telemedicine).  Patients are able to view lab/test results, encounter notes, upcoming appointments, etc.  Non-urgent messages can be sent to your provider as well.   To learn more about what you can do with MyChart, go to [email or web address].    Your next appointment:   1 year(s) @ [address] [place], [place] [address]   The format for your next appointment:   In Person  Provider:   Buford Dresser, MD       [facility],acting as a scribe for Buford Dresser, MD.,have documented all relevant documentation on the behalf of Buford Dresser, MD,as directed by  Buford Dresser, MD while in the presence of Buford Dresser, MD.  I, Buford Dresser, MD, have reviewed all documentation for this visit. The documentation on 11/18/20  for the exam, diagnosis, procedures, and orders are all accurate and complete.  Signed, Buford Dresser, MD PhD 11/18/2020 12:44 PM    [place]

## 2020-11-18 ENCOUNTER — Other Ambulatory Visit: Payer: Self-pay

## 2020-11-18 ENCOUNTER — Encounter: Payer: Self-pay | Admitting: Cardiology

## 2020-11-18 ENCOUNTER — Ambulatory Visit: Payer: 59 | Admitting: Cardiology

## 2020-11-18 VITALS — BP 116/80 | HR 82 | Ht 66.0 in | Wt 181.0 lb

## 2020-11-18 DIAGNOSIS — Z79899 Other long term (current) drug therapy: Secondary | ICD-10-CM

## 2020-11-18 DIAGNOSIS — R0789 Other chest pain: Secondary | ICD-10-CM

## 2020-11-18 DIAGNOSIS — I2510 Atherosclerotic heart disease of native coronary artery without angina pectoris: Secondary | ICD-10-CM | POA: Diagnosis not present

## 2020-11-18 DIAGNOSIS — I700 Atherosclerosis of aorta: Secondary | ICD-10-CM

## 2020-11-18 DIAGNOSIS — Z7189 Other specified counseling: Secondary | ICD-10-CM

## 2020-11-18 DIAGNOSIS — Z8249 Family history of ischemic heart disease and other diseases of the circulatory system: Secondary | ICD-10-CM

## 2020-11-18 LAB — LIPID PANEL
Chol/HDL Ratio: 2.4 ratio (ref 0.0–4.4)
Cholesterol, Total: 149 mg/dL (ref 100–199)
HDL: 61 mg/dL (ref >39)
LDL Chol Calc (NIH): 72 mg/dL (ref 0–99)
Triglycerides: 87 mg/dL (ref 0–149)
VLDL Cholesterol Cal: 16 mg/dL (ref 5–40)

## 2020-11-18 LAB — HEPATIC FUNCTION PANEL
ALT: 16 IU/L (ref 0–32)
AST: 16 IU/L (ref 0–40)
Albumin: 4.2 g/dL (ref 3.8–4.8)
Alkaline Phosphatase: 91 IU/L (ref 44–121)
Bilirubin Total: 0.3 mg/dL (ref 0.0–1.2)
Bilirubin, Direct: <0.1 mg/dL (ref 0.00–0.40)
Total Protein: 6.6 g/dL (ref 6.0–8.5)

## 2020-11-18 NOTE — Patient Instructions (Signed)
Medication Instructions:  Your Physician recommend you continue on your current medication as directed.    *If you need a refill on your cardiac medications before your next appointment, please call your pharmacy*   Lab Work: Your physician recommends lab work today (Lipid, LFT).    Testing/Procedures: None   Follow-Up: At [facility], you and your health needs are our priority.  As part of our continuing mission to provide you with exceptional heart care, we have created designated Provider Care Teams.  These Care Teams include your primary Cardiologist (physician) and Advanced Practice Providers (APPs -  Physician Assistants and Nurse Practitioners) who all work together to provide you with the care you need, when you need it.  We recommend signing up for the patient portal called "MyChart".  Sign up information is provided on this After Visit Summary.  MyChart is used to connect with patients for Virtual Visits (Telemedicine).  Patients are able to view lab/test results, encounter notes, upcoming appointments, etc.  Non-urgent messages can be sent to your provider as well.   To learn more about what you can do with MyChart, go to [email or web address].    Your next appointment:   1 year(s) @ [address] [place], [place] [address]   The format for your next appointment:   In Person  Provider:   Buford Dresser, MD

## 2020-12-03 ENCOUNTER — Other Ambulatory Visit: Payer: Self-pay | Admitting: Family Medicine

## 2020-12-03 DIAGNOSIS — E041 Nontoxic single thyroid nodule: Secondary | ICD-10-CM

## 2020-12-03 NOTE — Telephone Encounter (Signed)
Left a voice mail for [facility] [phone]) the scheduler for Interventional Radiology appts.

## 2020-12-14 ENCOUNTER — Ambulatory Visit
Admission: RE | Admit: 2020-12-14 | Discharge: 2020-12-14 | Disposition: A | Discharge status: Home / Self Care | Payer: 59 | Source: Ambulatory Visit | Attending: Family Medicine | Admitting: Family Medicine

## 2020-12-14 ENCOUNTER — Other Ambulatory Visit (HOSPITAL_COMMUNITY)
Admission: RE | Admit: 2020-12-14 | Discharge: 2020-12-14 | Disposition: A | Discharge status: Home / Self Care | Payer: 59 | Source: Ambulatory Visit | Attending: Radiology | Admitting: Radiology

## 2020-12-14 DIAGNOSIS — E041 Nontoxic single thyroid nodule: Secondary | ICD-10-CM | POA: Diagnosis not present

## 2020-12-15 LAB — CYTOLOGY - NON PAP

## 2020-12-20 ENCOUNTER — Telehealth: Payer: Self-pay | Admitting: Neurology

## 2020-12-20 NOTE — Telephone Encounter (Signed)
Patient has Dysport appointment 6/29. I called [employer] [phone]) and spoke with Ilona Sorrel. Ilona Sorrel states the order is in benefit verification. He advised me to check back in 24-48 hours.

## 2020-12-22 ENCOUNTER — Ambulatory Visit: Payer: 59 | Admitting: Neurology

## 2020-12-22 NOTE — Telephone Encounter (Signed)
Received call from [employer] stating patient needs a PA for Dysport. I called [facility] [phone]) and spoke with [facility] to check PA for CPT [place], [id number], and [phone]. Karena Addison states PA is not required for [phone] and [phone]. She states I will need to obtain PA for J0585. Reference [id number]. I completed PA request using [employer] portal. PA was approved. PA #[id number] (12/22/20- 12/22/21).

## 2020-12-27 NOTE — Telephone Encounter (Signed)
Spoke with Banks at [facility] to schedule Dysport delivery. He states they need patient's consent for shipment. He placed me on hold and attempted to contact the patient but was unsuccessful. After our call I called the patient and LVM as well.

## 2020-12-28 NOTE — Telephone Encounter (Signed)
Pt called in to cancel Dysport appt. She states she has to stay home to take care of her mother and does not want to reschedule at this time.

## 2020-12-29 ENCOUNTER — Ambulatory Visit: Payer: 59 | Admitting: Neurology

## 2021-01-08 ENCOUNTER — Other Ambulatory Visit: Payer: Self-pay | Admitting: Physician Assistant

## 2021-01-10 ENCOUNTER — Other Ambulatory Visit: Payer: Self-pay | Admitting: Pulmonary Disease

## 2021-01-10 NOTE — Telephone Encounter (Signed)
Next Visit: 02/08/2021  Last Visit: 11/02/2020  Last Fill: 10/08/2020  [id number] arthritis of multiple sites with negative rheumatoid factor  Current Dose per office note 11/02/2020: rinvoq 15 mg 1 tablet by mouth daily  Labs: 11/02/2020 WBC count is slightly elevated likely due to prednisone use.   Creatinine is elevated-1.12 and GFR is slightly low-53  TB Gold: 03/24/2020 Neg    Okay to refill Rinvoq?

## 2021-01-26 NOTE — Progress Notes (Deleted)
Office Visit Note  Patient: Courtney Myers             Date of Birth: [birth year]           MRN: [id number]             PCP: Susy Frizzle, MD Referring: Susy Frizzle, MD Visit Date: 02/08/2021 Occupation: '@GUAROCC'$ @  Subjective:  No chief complaint on file.   History of Present Illness: Courtney Myers is a 60 y.o. female ***   Activities of Daily Living:  Patient reports morning stiffness for *** {minute/hour:19697}.   Patient {ACTIONS;DENIES/REPORTS:21021675::"Denies"} nocturnal pain.  Difficulty dressing/grooming: {ACTIONS;DENIES/REPORTS:21021675::"Denies"} Difficulty climbing stairs: {ACTIONS;DENIES/REPORTS:21021675::"Denies"} Difficulty getting out of chair: {ACTIONS;DENIES/REPORTS:21021675::"Denies"} Difficulty using hands for taps, buttons, cutlery, and/or writing: {ACTIONS;DENIES/REPORTS:21021675::"Denies"}  No Rheumatology ROS completed.   PMFS History:  Patient Active Problem List   Diagnosis Date Noted   Encounter for screening for lung cancer 12/29/2019   Excessive sleepiness 09/08/2014   Fibromyalgia syndrome 08/25/2014   RA (rheumatoid arthritis) (HCC)    GERD (gastroesophageal reflux disease)    Anxiety    Depression    History of shingles    Synovitis of hand    Cervical dystonia 12/04/2012    Past Medical History:  Diagnosis Date   Anxiety    Cervical dystonia    Depression    Fibromyalgia    GERD (gastroesophageal reflux disease)    History of shingles    RA (rheumatoid arthritis) (HCC)    Synovitis of hand     Family History  Problem Relation Age of Onset   Arthritis Mother    Heart Problems Father    Heart disease Father    Heart disease Paternal Uncle        MI   Past Surgical History:  Procedure Laterality Date   TONSILLECTOMY     VAGINAL HYSTERECTOMY  3/08   Social History   Social History Narrative   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 Right handed.    Caffeine- 4 cups daily.   Immunization History  Administered Date(s) Administered   Influenza,inj,Quad PF,6+ Mos 05/25/2018, 04/03/2019, 04/26/2020   Influenza-Unspecified 06/24/2014, 05/01/2015, 04/10/2016, 04/06/2017   Moderna Sars-Covid-2 Vaccination 08/29/2019, 09/26/2019, 06/19/2020   Pneumococcal Conjugate-13 05/01/2015   Pneumococcal Polysaccharide-23 04/10/2016   Tdap 09/09/2015     Objective: Vital Signs: There were no vitals taken for this visit.   Physical Exam   Musculoskeletal Exam: ***  CDAI Exam: CDAI Score: -- Patient Global: --; Provider Global: -- Swollen: --; Tender: -- Joint Exam 02/08/2021   No joint exam has been documented for this visit   There is currently no information documented on the homunculus. Go to the Rheumatology activity and complete the homunculus joint exam.  Investigation: No additional findings.  Imaging: No results found.  Recent Labs: Lab Results  Component Value Date   WBC 11.5 (H) 11/02/2020   HGB 12.3 11/02/2020   PLT 277 11/02/2020   NA 140 11/02/2020   K 4.1 11/02/2020   CL 104 11/02/2020   CO2 29 11/02/2020   GLUCOSE 87 11/02/2020   BUN 15 11/02/2020   CREATININE 1.12 (H) 11/02/2020   BILITOT 0.3 11/18/2020   ALKPHOS 91 11/18/2020   AST 16 11/18/2020   ALT 16 11/18/2020   PROT 6.6 11/18/2020   ALBUMIN 4.2 11/18/2020   CALCIUM 9.2 11/02/2020   GFRAA 61 11/02/2020   QFTBGOLD Negative 09/14/2015  QFTBGOLDPLUS NEGATIVE 03/24/2020    Speciality Comments: Remicade '6mg'$ /kg every 6 weeks 07/13-09/21 Rinvoq 15 mg qd 04/28/20 MTX - dcd due to increase in [place] started 06/21/20   Procedures:  No procedures performed Allergies: Patient has no known allergies.   Assessment / Plan:     Visit Diagnoses: No diagnosis found.  Orders: No orders of the defined types were placed in this encounter.  No orders of the defined types were placed in this  encounter.   Face-to-face time spent with patient was *** minutes. Greater than 50% of time was spent in counseling and coordination of care.  Follow-Up Instructions: No follow-ups on file.   Earnestine Mealing, CMA  Note - This record has been created using Editor, commissioning.  Chart creation errors have been sought, but may not always  have been located. Such creation errors do not reflect on  the standard of medical care.

## 2021-02-08 ENCOUNTER — Ambulatory Visit: Payer: 59 | Admitting: Rheumatology

## 2021-02-08 DIAGNOSIS — R0602 Shortness of breath: Secondary | ICD-10-CM

## 2021-02-08 DIAGNOSIS — F32A Depression, unspecified: Secondary | ICD-10-CM

## 2021-02-08 DIAGNOSIS — N390 Urinary tract infection, site not specified: Secondary | ICD-10-CM

## 2021-02-08 DIAGNOSIS — R5382 Chronic fatigue, unspecified: Secondary | ICD-10-CM

## 2021-02-08 DIAGNOSIS — M797 Fibromyalgia: Secondary | ICD-10-CM

## 2021-02-08 DIAGNOSIS — M0609 Rheumatoid arthritis without rheumatoid factor, multiple sites: Secondary | ICD-10-CM

## 2021-02-08 DIAGNOSIS — Z79899 Other long term (current) drug therapy: Secondary | ICD-10-CM

## 2021-02-08 DIAGNOSIS — F5101 Primary insomnia: Secondary | ICD-10-CM

## 2021-02-08 DIAGNOSIS — Z8719 Personal history of other diseases of the digestive system: Secondary | ICD-10-CM

## 2021-02-08 DIAGNOSIS — M3500 Sicca syndrome, unspecified: Secondary | ICD-10-CM

## 2021-02-21 ENCOUNTER — Other Ambulatory Visit: Payer: Self-pay | Admitting: *Deleted

## 2021-02-21 MED ORDER — LEFLUNOMIDE 10 MG PO TABS: ORAL_TABLET | ORAL | 0 refills | Status: DC

## 2021-02-21 NOTE — Telephone Encounter (Signed)
Refill request received via fax  Next Visit: due August 2022.    Last Visit: 11/02/2020   Last Fill: 10/08/2020   [id number] arthritis of multiple sites with negative rheumatoid factor   Current Dose per office note 11/02/2020: Arava 10 mg 1 tablet by mouth daily    Labs: 11/02/2020 WBC count is slightly elevated likely due to prednisone use.   Creatinine is elevated-1.12 and GFR is slightly low-53  Left message to advise patient she is due for labs and a follow up visit.   Okay to refill Arava?

## 2021-02-22 NOTE — Progress Notes (Signed)
Office Visit Note  Patient: Courtney Myers             Date of Birth: [birth year]           MRN: [id number]             PCP: Susy Frizzle, MD Referring: Susy Frizzle, MD Visit Date: 02/23/2021 Occupation: '@GUAROCC'$ @  Subjective:  Medication management.   History of Present Illness: Courtney Myers is a 60 y.o. female with a history of rheumatoid arthritis.  She states she has been doing well on the combination of Rinvoq and Arava.  She is occasional discomfort in her joints but no joint swelling.  She was also evaluated by Dr. Vaughan Browner who diagnosed her with COPD.  She has been using inhaler which has been helpful.  Activities of Daily Living:  Patient reports morning stiffness for 0 minutes.   Patient Reports nocturnal pain.  Difficulty dressing/grooming: Denies Difficulty climbing stairs: Denies Difficulty getting out of chair: Denies Difficulty using hands for taps, buttons, cutlery, and/or writing: Denies  Review of Systems  Constitutional:  Positive for fatigue.  HENT:  Positive for mouth dryness and nose dryness. Negative for mouth sores.   Eyes:  Negative for pain, itching and dryness.  Respiratory:  Positive for shortness of breath and difficulty breathing.   Cardiovascular:  Negative for chest pain and palpitations.  Gastrointestinal:  Negative for blood in stool, constipation and diarrhea.  Endocrine: Negative for increased urination.  Genitourinary:  Negative for difficulty urinating.  Musculoskeletal:  Negative for joint pain, joint pain, joint swelling, myalgias, morning stiffness, muscle tenderness and myalgias.  Skin:  Negative for color change, rash and redness.  Allergic/Immunologic: Negative for susceptible to infections.  Neurological:  Negative for dizziness, numbness, headaches, memory loss and weakness.  Hematological:  Positive for bruising/bleeding tendency.  Psychiatric/Behavioral:  Negative for confusion.    PMFS History:   Patient Active Problem List   Diagnosis Date Noted   Encounter for screening for lung cancer 12/29/2019   Excessive sleepiness 09/08/2014   Fibromyalgia syndrome 08/25/2014   RA (rheumatoid arthritis) (HCC)    GERD (gastroesophageal reflux disease)    Anxiety    Depression    History of shingles    Synovitis of hand    Cervical dystonia 12/04/2012    Past Medical History:  Diagnosis Date   Anxiety    Cervical dystonia    COPD (chronic obstructive pulmonary disease) ([place]) 10/2020   Depression    Fibromyalgia    GERD (gastroesophageal reflux disease)    History of shingles    RA (rheumatoid arthritis) (HCC)    Synovitis of hand     Family History  Problem Relation Age of Onset   Arthritis Mother    Heart Problems Father    Heart disease Father    Heart disease Paternal Uncle        MI   Past Surgical History:  Procedure Laterality Date   TONSILLECTOMY     VAGINAL HYSTERECTOMY  3/08   Social History   Social History Narrative   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 Right handed.    Caffeine- 4 cups daily.   Immunization History  Administered Date(s) Administered   Influenza,inj,Quad PF,6+ Mos 05/25/2018, 04/03/2019, 04/26/2020   Influenza-Unspecified 06/24/2014, 05/01/2015, 04/10/2016, 04/06/2017   Moderna Sars-Covid-2 Vaccination 08/29/2019, 09/26/2019, 06/19/2020   Pneumococcal Conjugate-13 05/01/2015   Pneumococcal Polysaccharide-23 04/10/2016   Tdap  09/09/2015     Objective: Vital Signs: BP 128/82 (BP Location: Left Arm, Patient Position: Sitting, Cuff Size: Normal)   Pulse 80   Ht '5\' 6"'$  (1.676 m)   Wt 175 lb (79.4 kg)   BMI 28.25 kg/m    Physical Exam Vitals and nursing note reviewed.  Constitutional:      Appearance: She is well-developed.  HENT:     Head: Normocephalic and atraumatic.  Eyes:     Conjunctiva/sclera: Conjunctivae normal.  Cardiovascular:      Rate and Rhythm: Normal rate and regular rhythm.     Heart sounds: Normal heart sounds.  Pulmonary:     Effort: Pulmonary effort is normal.     Breath sounds: Normal breath sounds.  Abdominal:     General: Bowel sounds are normal.     Palpations: Abdomen is soft.  Musculoskeletal:     Cervical back: Normal range of motion.  Lymphadenopathy:     Cervical: No cervical adenopathy.  Skin:    General: Skin is warm and dry.     Capillary Refill: Capillary refill takes less than 2 seconds.  Neurological:     Mental Status: She is alert and oriented to person, place, and time.  Psychiatric:        Behavior: Behavior normal.     Musculoskeletal Exam: C-spine thoracic and lumbar spine with good range of motion.  Shoulder joints, elbow joints, wrist joints, MCPs PIPs and DIPs with good range of motion with no synovitis.  Hip joints, knee joints, ankles, MTPs and PIPs with good range of motion with no synovitis.  CDAI Exam: CDAI Score: 0.2  Patient Global: 1 mm; Provider Global: 1 mm Swollen: 0 ; Tender: 0  Joint Exam 02/23/2021   No joint exam has been documented for this visit   There is currently no information documented on the homunculus. Go to the Rheumatology activity and complete the homunculus joint exam.  Investigation: No additional findings.  Imaging: No results found.  Recent Labs: Lab Results  Component Value Date   WBC 11.5 (H) 11/02/2020   HGB 12.3 11/02/2020   PLT 277 11/02/2020   NA 140 11/02/2020   K 4.1 11/02/2020   CL 104 11/02/2020   CO2 29 11/02/2020   GLUCOSE 87 11/02/2020   BUN 15 11/02/2020   CREATININE 1.12 (H) 11/02/2020   BILITOT 0.3 11/18/2020   ALKPHOS 91 11/18/2020   AST 16 11/18/2020   ALT 16 11/18/2020   PROT 6.6 11/18/2020   ALBUMIN 4.2 11/18/2020   CALCIUM 9.2 11/02/2020   GFRAA 61 11/02/2020   QFTBGOLD Negative 09/14/2015   QFTBGOLDPLUS NEGATIVE 03/24/2020    Speciality Comments: Remicade '6mg'$ /kg every 6 weeks 07/13-09/21 Rinvoq  15 mg qd 04/28/20 MTX - dcd due to increase in [place] started 06/21/20   Procedures:  No procedures performed Allergies: Patient has no known allergies.   Assessment / Plan:     Visit Diagnoses: Rheumatoid arthritis of multiple sites with negative rheumatoid factor (HCC)-patient had no synovitis on examination.  She has been doing well on current combination.  She denies any recent episodes of joint swelling.  She has intermittent discomfort in her joints which resolves quickly.  High risk medication use - Rinvoq 15 mg 1 tablet by mouth daily (started 04/28/20) and Arava 10 mg 1 tablet by mouth daily (started 06/21/20).  Inadequate response to Remicade '6mg'$ /kg. - Plan: CBC with Differential/Platelet, COMPLETE METABOLIC PANEL WITH GFR, QuantiFERON-TB Gold Plus today.  She will get labs  every 3 months to monitor for drug toxicity.  She has been advised to stop Rinvoq and Arava in case she develops an infection and restart once infection resolves.  Updated information regarding realization was also placed in the AVS.  Sicca complex (HCC)-over-the-counter products were discussed.  History of COPD - evaluated by Dr. Vaughan Browner on 09/23/20.  She had a high resolution chest CT performed on 09/29/20 which was consistent with smoking related bronchiolitis.  Fibromyalgia -she continues to have some discomfort from fibromyalgia.  Stretching exercises were discussed.  She is on Cymbalta '60mg'$  by mouth daily.   Chronic fatigue-related to fibromyalgia.  Primary insomnia-good sleep hygiene was discussed.  Recurrent UTI  History of gastroesophageal reflux (GERD)  Anxiety and depression-controlled rotation.  Orders: Orders Placed This Encounter  Procedures   CBC with Differential/Platelet   COMPLETE METABOLIC PANEL WITH GFR   QuantiFERON-TB Gold Plus    No orders of the defined types were placed in this encounter.    Follow-Up Instructions: Return in about 5 months (around 07/26/2021) for  Rheumatoid arthritis.   Bo Merino, MD  Note - This record has been created using Editor, commissioning.  Chart creation errors have been sought, but may not always  have been located. Such creation errors do not reflect on  the standard of medical care.

## 2021-02-23 ENCOUNTER — Ambulatory Visit: Payer: 59 | Admitting: Rheumatology

## 2021-02-23 ENCOUNTER — Telehealth: Payer: Self-pay

## 2021-02-23 ENCOUNTER — Other Ambulatory Visit: Payer: Self-pay

## 2021-02-23 ENCOUNTER — Encounter: Payer: Self-pay | Admitting: Rheumatology

## 2021-02-23 ENCOUNTER — Other Ambulatory Visit (HOSPITAL_COMMUNITY): Payer: Self-pay

## 2021-02-23 VITALS — BP 128/82 | HR 80 | Ht 66.0 in | Wt 175.0 lb

## 2021-02-23 DIAGNOSIS — R0602 Shortness of breath: Secondary | ICD-10-CM

## 2021-02-23 DIAGNOSIS — M3500 Sicca syndrome, unspecified: Secondary | ICD-10-CM

## 2021-02-23 DIAGNOSIS — Z8709 Personal history of other diseases of the respiratory system: Secondary | ICD-10-CM | POA: Diagnosis not present

## 2021-02-23 DIAGNOSIS — Z79899 Other long term (current) drug therapy: Secondary | ICD-10-CM

## 2021-02-23 DIAGNOSIS — Z8719 Personal history of other diseases of the digestive system: Secondary | ICD-10-CM

## 2021-02-23 DIAGNOSIS — M0609 Rheumatoid arthritis without rheumatoid factor, multiple sites: Secondary | ICD-10-CM | POA: Diagnosis not present

## 2021-02-23 DIAGNOSIS — N390 Urinary tract infection, site not specified: Secondary | ICD-10-CM

## 2021-02-23 DIAGNOSIS — F419 Anxiety disorder, unspecified: Secondary | ICD-10-CM

## 2021-02-23 DIAGNOSIS — F5101 Primary insomnia: Secondary | ICD-10-CM

## 2021-02-23 DIAGNOSIS — M797 Fibromyalgia: Secondary | ICD-10-CM

## 2021-02-23 DIAGNOSIS — R5382 Chronic fatigue, unspecified: Secondary | ICD-10-CM

## 2021-02-23 DIAGNOSIS — F32A Depression, unspecified: Secondary | ICD-10-CM

## 2021-02-23 NOTE — Telephone Encounter (Signed)
PA renewal automatically created by CoverMyMeds.  Submitted a Prior Authorization request to [facility] for [facility] via CoverMyMeds. Will update once we receive a response.   Key: [id number]

## 2021-02-23 NOTE — Telephone Encounter (Signed)
Received notification from [facility] regarding a prior authorization for [facility]. Authorization has been APPROVED from 02/23/2021 to 02/23/2022.   Patient must fill through [facility]: [phone]   Authorization # [id number]

## 2021-02-23 NOTE — Patient Instructions (Signed)
Standing Labs We placed an order today for your standing lab work.   Please have your standing labs drawn in November and every 3 months  If possible, please have your labs drawn 2 weeks prior to your appointment so that the provider can discuss your results at your appointment.  Please note that you may see your imaging and lab results in [facility] before we have reviewed them. We may be awaiting multiple results to interpret others before contacting you. Please allow our office up to 72 hours to thoroughly review all of the results before contacting the office for clarification of your results.  We have open lab daily: Monday through Thursday from 1:30-4:30 PM and Friday from 1:30-4:00 PM at the office of Dr. Bo Merino, [facility].   Please be advised, all patients with office appointments requiring lab work will take precedent over walk-in lab work.  If possible, please come for your lab work on Monday and Friday afternoons, as you may experience shorter wait times. The office is located at [address] [place], [place], [place] [address] No appointment is necessary.   Labs are drawn by [facility]. Please bring your co-pay at the time of your lab draw.  You may receive a bill from [facility] for your lab work.  If you wish to have your labs drawn at another location, please call the office 24 hours in advance to send orders.  If you have any questions regarding directions or hours of operation,  please call [phone].   As a reminder, please drink plenty of water prior to coming for your lab work. Thanks!   COVID-19 vaccine recommendations:   COVID-19 vaccine is recommended for everyone (unless you are allergic to a vaccine component), even if you are on a medication that suppresses your immune system.   If you are on Methotrexate, Cellcept (mycophenolate), Rinvoq, Morrie Sheldon, and Olumiant- hold the medication for 1 week after each vaccine. Hold Methotrexate for 2 weeks  after the single dose COVID-19 vaccine.   If you are on Orencia subcutaneous injection - hold medication one week prior to and one week after the first COVID-19 vaccine dose (only).   If you are on Orencia IV infusions- time vaccination administration so that the first COVID-19 vaccination will occur four weeks after the infusion and postpone the subsequent infusion by one week.   If you are on Cyclophosphamide or Rituxan infusions please contact your doctor prior to receiving the COVID-19 vaccine.   Do not take Tylenol or any anti-inflammatory medications (NSAIDs) 24 hours prior to the COVID-19 vaccination.   There is no direct evidence about the efficacy of the COVID-19 vaccine in individuals who are on medications that suppress the immune system.   Even if you are fully vaccinated, and you are on any medications that suppress your immune system, please continue to wear a mask, maintain at least six feet social distance and practice hand hygiene.   If you develop a COVID-19 infection, please contact your PCP or our office to determine if you need monoclonal antibody infusion.  The booster vaccine is now available for immunocompromised patients.   Please see the following web sites for updated information.   [email or web address]  If you have signs or symptoms of an infection or start antibiotics: First, call your PCP for workup of your infection. Hold your medication through the infection, until you complete your antibiotics, and until symptoms resolve if you take the following: Injectable medication (Actemra, Benlysta, Cimzia, Cosentyx, Enbrel, Humira,  Darcus Pester, Orencia, Remicade, Simponi, Stelara, Fanny Dance) Methotrexate Leflunomide (Arava) Mycophenolate (Cellcept) Roma Kayser, or Rinvoq  If you test POSITIVE for COVID19 and have MILD to MODERATE symptoms: First, call your PCP if you would like to receive  COVID19 treatment AND Hold your medications during the infection and for at least 1 week after your symptoms have resolved: Injectable medication (Benlysta, Cimzia, Cosentyx, Enbrel, Humira, Orencia, Remicade, Simponi, Stelara, Taltz, Tremfya) Methotrexate Leflunomide (Arava) Azathioprine Mycophenolate (Cellcept) Roma Kayser, or Rinvoq [place]la If you take Actemra or Kevzara, you DO NOT need to hold these for COVID19 infection.  If you test POSITIVE for COVID19 and have NO symptoms: First, call your PCP if you would like to receive COVID19 treatment AND Hold your medications for at least 10 days after the day that you tested positive Injectable medication (Benlysta, Cimzia, Cosentyx, Enbrel, Humira, Orencia, Remicade, Simponi, Stelara, Taltz, Tremfya) Methotrexate Leflunomide (Arava) Azathioprine Mycophenolate (Cellcept) Roma Kayser, or Rinvoq Otezla If you take Actemra or Kevzara, you DO NOT need to hold these for COVID19 infection.  Vaccines You are taking a medication(s) that can suppress your immune system.  The following immunizations are recommended: Flu annually Covid-19  Td/Tdap (tetanus, diphtheria, pertussis) every 10 years Pneumonia (Prevnar 15 then Pneumovax 23 at least 1 year apart.  Alternatively, can take Prevnar 20 without needing additional dose) Shingrix (after age 68): 2 doses from 4 weeks to 6 months apart  Please check with your PCP to make sure you are up to date.  Heart Disease Prevention   Your inflammatory disease increases your risk of heart disease which includes heart attack, stroke, atrial fibrillation (irregular heartbeats), high blood pressure, heart failure and atherosclerosis (plaque in the arteries).  It is important to reduce your risk by:   Keep blood pressure, cholesterol, and blood sugar at healthy levels   Smoking Cessation   Maintain a healthy weight  BMI 20-25   Eat a healthy diet  Plenty of fresh fruit, vegetables, and  whole grains  Limit saturated fats, foods high in sodium, and added sugars  DASH and Mediterranean diet   Increase physical activity  Recommend moderate physically activity for 150 minutes per week/ 30 minutes a day for five days a week These can be broken up into three separate ten-minute sessions during the day.   Reduce Stress  Meditation, slow breathing exercises, yoga, coloring books  Dental visits twice a year

## 2021-02-26 LAB — COMPLETE METABOLIC PANEL WITH GFR
AG Ratio: 1.5 (calc) (ref 1.0–2.5)
ALT: 18 U/L (ref 6–29)
AST: 18 U/L (ref 10–35)
Albumin: 4.1 g/dL (ref 3.6–5.1)
Alkaline phosphatase (APISO): 91 U/L (ref 37–153)
BUN: 19 mg/dL (ref 7–25)
CO2: 28 mmol/L (ref 20–32)
Calcium: 9.6 mg/dL (ref 8.6–10.4)
Chloride: 103 mmol/L (ref 98–110)
Creat: 1.05 mg/dL (ref 0.50–1.05)
Globulin: 2.7 g/dL (calc) (ref 1.9–3.7)
Glucose, Bld: 85 mg/dL (ref 65–99)
Potassium: 4.4 mmol/L (ref 3.5–5.3)
Sodium: 139 mmol/L (ref 135–146)
Total Bilirubin: 0.3 mg/dL (ref 0.2–1.2)
Total Protein: 6.8 g/dL (ref 6.1–8.1)
eGFR: 60 mL/min/{1.73_m2} (ref >=60)

## 2021-02-26 LAB — QUANTIFERON-TB GOLD PLUS
Mitogen-NIL: 9.61 IU/mL
NIL: 0.04 IU/mL
QuantiFERON-TB Gold Plus: NEGATIVE
TB1-NIL: 0 IU/mL
TB2-NIL: <0 IU/mL

## 2021-02-26 LAB — CBC WITH DIFFERENTIAL/PLATELET
Absolute Monocytes: 677 cells/uL (ref 200–950)
Basophils Absolute: 66 cells/uL (ref 0–200)
Basophils Relative: 0.7 %
Eosinophils Absolute: 85 cells/uL (ref 15–500)
Eosinophils Relative: 0.9 %
HCT: 37.8 % (ref 35.0–45.0)
Hemoglobin: 12.4 g/dL (ref 11.7–15.5)
Lymphs Abs: 2801 cells/uL (ref 850–3900)
MCH: 29.4 pg (ref 27.0–33.0)
MCHC: 32.8 g/dL (ref 32.0–36.0)
MCV: 89.6 fL (ref 80.0–100.0)
MPV: 12.1 fL (ref 7.5–12.5)
Monocytes Relative: 7.2 %
Neutro Abs: 5772 cells/uL (ref 1500–7800)
Neutrophils Relative %: 61.4 %
Platelets: 263 10*3/uL (ref 140–400)
RBC: 4.22 10*6/uL (ref 3.80–5.10)
RDW: 13 % (ref 11.0–15.0)
Total Lymphocyte: 29.8 %
WBC: 9.4 10*3/uL (ref 3.8–10.8)

## 2021-02-27 NOTE — Progress Notes (Signed)
TB Gold is negative.

## 2021-03-09 ENCOUNTER — Other Ambulatory Visit: Payer: Self-pay | Admitting: *Deleted

## 2021-03-09 MED ORDER — DULOXETINE HCL 60 MG PO CPEP: ORAL_CAPSULE | ORAL | 2 refills | Status: DC

## 2021-03-09 NOTE — Telephone Encounter (Signed)
Refill request received via fax  Next Visit: 07/27/2021  Last Visit: 02/23/2021  Last Fill: 10/27/2020  Dx: Fibromyalgia  Current Dose per office note on 02/23/2021: Cymbalta '60mg'$  by mouth daily.   Okay to refill Cymbalta?

## 2021-03-17 ENCOUNTER — Other Ambulatory Visit: Payer: Self-pay | Admitting: *Deleted

## 2021-03-17 DIAGNOSIS — K219 Gastro-esophageal reflux disease without esophagitis: Secondary | ICD-10-CM

## 2021-03-17 MED ORDER — OMEPRAZOLE 20 MG PO CPDR: 20.0000 mg | DELAYED_RELEASE_CAPSULE | Freq: Every day | ORAL | 1 refills | Status: DC

## 2021-03-23 ENCOUNTER — Telehealth: Payer: 59 | Admitting: Nurse Practitioner

## 2021-03-23 DIAGNOSIS — N3000 Acute cystitis without hematuria: Secondary | ICD-10-CM | POA: Diagnosis not present

## 2021-03-23 MED ORDER — CEPHALEXIN 500 MG PO CAPS: 500.0000 mg | ORAL_CAPSULE | Freq: Two times a day (BID) | ORAL | 0 refills | Status: DC

## 2021-03-23 NOTE — Progress Notes (Signed)

## 2021-03-26 ENCOUNTER — Other Ambulatory Visit: Payer: Self-pay | Admitting: Physician Assistant

## 2021-03-27 ENCOUNTER — Other Ambulatory Visit: Payer: Self-pay | Admitting: Physician Assistant

## 2021-03-28 NOTE — Telephone Encounter (Signed)
Next Visit: 07/27/2021  Last Visit: 02/23/2021  Last Fill: 01/10/2021  DX: Rheumatoid arthritis of multiple sites with negative rheumatoid factor  Current Dose per office note 02/23/2021: Rinvoq 15 mg 1 tablet by mouth daily   Labs: 02/23/2021 CBC and CMP WNL   TB Gold: 02/23/2021 Neg  Okay to refill Rinvoq?

## 2021-03-28 NOTE — Telephone Encounter (Signed)
Next Visit: 07/27/2021   Last Visit: 02/23/2021   Last Fill: 02/21/2021  Dx: Rheumatoid arthritis of multiple sites with negative rheumatoid factor   Current Dose per office note on 02/23/2021: Arava 10 mg 1 tablet by mouth daily   Labs: 02/23/2021: CBC and CMP WNL   Okay to refill Arava?

## 2021-03-29 ENCOUNTER — Other Ambulatory Visit: Payer: Self-pay

## 2021-03-29 ENCOUNTER — Encounter: Payer: Self-pay | Admitting: Family Medicine

## 2021-03-29 ENCOUNTER — Ambulatory Visit: Payer: 59 | Admitting: Family Medicine

## 2021-03-29 VITALS — BP 124/80 | HR 96 | Temp 97.8°F | Resp 14 | Ht 66.0 in | Wt 175.0 lb

## 2021-03-29 DIAGNOSIS — Z716 Tobacco abuse counseling: Secondary | ICD-10-CM

## 2021-03-29 MED ORDER — VARENICLINE TARTRATE 1 MG PO TABS
1.0000 mg | ORAL_TABLET | Freq: Two times a day (BID) | ORAL | 4 refills | Status: DC
Start: 2021-03-29 — End: 2021-08-25

## 2021-03-29 MED ORDER — VARENICLINE TARTRATE 0.5 MG X 11 & 1 MG X 42 PO MISC: ORAL | 0 refills | Status: DC

## 2021-03-29 NOTE — Progress Notes (Signed)
Subjective:    Patient ID: Courtney Myers, female    DOB: 07/07/1960, 60 y.o.   MRN: [id number]  HPI Patient is here today to discuss smoking cessation strategies Past Medical History:  Diagnosis Date   Anxiety    Cervical dystonia    COPD (chronic obstructive pulmonary disease) ([facility]) 10/2020   Depression    Fibromyalgia    GERD (gastroesophageal reflux disease)    History of shingles    RA (rheumatoid arthritis) ([facility])    Synovitis of hand    Past Surgical History:  Procedure Laterality Date   TONSILLECTOMY     VAGINAL HYSTERECTOMY  3/08   Current Outpatient Medications on File Prior to Visit  Medication Sig Dispense Refill   aspirin EC 81 MG tablet Take 1 tablet (81 mg total) by mouth daily. Swallow whole. 90 tablet 3   baclofen (LIORESAL) 10 MG tablet Take 10 mg by mouth daily as needed for muscle spasms.      cephALEXin (KEFLEX) 500 MG capsule Take 1 capsule (500 mg total) by mouth 2 (two) times daily. 14 capsule 0   clonazePAM (KLONOPIN) 0.5 MG tablet Take 1 tablet (0.5 mg total) by mouth 2 (two) times daily as needed. #60 must last 30 days. (Patient taking differently: Take 0.5 mg by mouth 2 (two) times daily. #60 must last 30 days.) 60 tablet 5   CRANBERRY PO Take 2 capsules by mouth in the morning and at bedtime.      DULoxetine (CYMBALTA) 60 MG capsule TAKE 1 CAPSULE(60 MG) BY MOUTH DAILY 30 capsule 2   DYSPORT 500 units SOLR injection INJECT 500 UNITS  INTRAMUSCULARLY EVERY 3  MONTHS (GIVEN AT MD OFFICE, DISCARD UNUSED) 1 each 3   leflunomide (ARAVA) 10 MG tablet TAKE 1 TABLET(10 MG) BY MOUTH DAILY 90 tablet 0   Multiple Vitamins-Minerals (MULTIVITAMINS THER. W/MINERALS) TABS Take 1 tablet by mouth at bedtime.      omeprazole (PRILOSEC) 20 MG capsule Take 1 capsule (20 mg total) by mouth daily. 90 capsule 1   RINVOQ 15 MG TB24 TAKE 1 TABLET BY MOUTH  DAILY 30 tablet 2   traMADol (ULTRAM) 50 MG tablet Take 1 - 2 every 8 hours as needed for pain. 30 tablet 0    TRELEGY ELLIPTA 200-62.5-25 MCG/INH AEPB INHALE 1 INHALATION BY  MOUTH INTO THE LUNGS DAILY 180 each 3   Xylitol (XYLIMELTS MT) Use as directed 2 tablets in the mouth or throat at bedtime as needed (dry mouth).      rosuvastatin (CRESTOR) 20 MG tablet Take 1 tablet (20 mg total) by mouth daily. 90 tablet 3   No current facility-administered medications on file prior to visit.   No Known Allergies Social History   Socioeconomic History   Marital status: Married    Spouse name: [name]   Number of children: 2   Years of education: 12   Highest education level: Not on file  Occupational History    Employer: [employer]  Tobacco Use   Smoking status: Former    Packs/day: 0.25    Years: 34.00    Pack years: 8.50    Types: Cigarettes    Start date: 1979    Quit date: 11/16/2020    Years since quitting: 0.3   Smokeless tobacco: Never   Tobacco comments:    5-6 cigs per day/11/16/20  Vaping Use   Vaping Use: Never used  Substance and Sexual Activity   Alcohol use: Yes    Alcohol/week:  0.0 standard drinks    Comment: Rare, twice per year   Drug use: Never   Sexual activity: Yes    Birth control/protection: Surgical    Comment: 1st intercourse 60 yo-Fewer than 5 partners  Other Topics Concern   Not on file  Social History Narrative   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 Right handed.    Caffeine- 4 cups daily.   Social Determinants of Health   Financial Resource Strain: Not on file  Food Insecurity: Not on file  Transportation Needs: Not on file  Physical Activity: Not on file  Stress: Not on file  Social Connections: Not on file  Intimate Partner Violence: Not on file     Review of Systems  All other systems reviewed and are negative.     Objective:   Physical Exam Vitals reviewed.  Constitutional:      Appearance: Normal appearance.  Cardiovascular:     Rate and Rhythm:  Normal rate and regular rhythm.  Pulmonary:     Effort: Pulmonary effort is normal.     Breath sounds: Normal breath sounds.  Neurological:     Mental Status: She is alert.          Assessment & Plan:  Encounter for tobacco use cessation counseling We discussed all of the patient's options including various types of nicotine replacement, Wellbutrin, and Chantix.  We also discussed the reason Chantix have been temporarily pulled from the market and the risk associated with that.  Ultimately the patient felt comfortable with trying Chantix.  I will prescribe for her the starting pack.  After the first month she can switch over to the continuing pack for a total of up to 6 months if necessary.

## 2021-04-05 ENCOUNTER — Encounter: Payer: Self-pay | Admitting: Family Medicine

## 2021-04-05 MED ORDER — CLONAZEPAM 0.5 MG PO TABS: 0.5000 mg | ORAL_TABLET | Freq: Two times a day (BID) | ORAL | 3 refills | Status: DC | PRN

## 2021-05-09 NOTE — Telephone Encounter (Addendum)
Spoke with patient, she has scheduled her next Dysport appointment for 11/28. I called [facility] @ [phone] to give verbal prescription. Dx: G24.3. Dysport 500 unit vial x1 every 3 months, x4 refills.

## 2021-05-12 ENCOUNTER — Other Ambulatory Visit: Payer: Self-pay | Admitting: Physician Assistant

## 2021-05-12 NOTE — Telephone Encounter (Signed)
Next Visit: 07/27/2021  Last Visit: 02/23/2021  Last Fill: 03/09/2021  Dx:  Fibromyalgia  Current Dose per office note on 02/23/2021: Cymbalta 60mg  by mouth daily.   Okay to refill Cymbalta?

## 2021-05-15 ENCOUNTER — Encounter: Payer: Self-pay | Admitting: Cardiology

## 2021-05-30 ENCOUNTER — Encounter: Payer: Self-pay | Admitting: Neurology

## 2021-05-30 ENCOUNTER — Ambulatory Visit: Payer: 59 | Admitting: Neurology

## 2021-05-30 VITALS — BP 141/89 | HR 85 | Ht 66.0 in | Wt 175.0 lb

## 2021-05-30 DIAGNOSIS — G243 Spasmodic torticollis: Secondary | ICD-10-CM | POA: Diagnosis not present

## 2021-05-30 NOTE — Progress Notes (Signed)
Dysport 500 units x vial NDC -[address]-1   Exp-12/30/2021 [id number]

## 2021-05-31 ENCOUNTER — Telehealth: Payer: Self-pay | Admitting: *Deleted

## 2021-05-31 MED ORDER — ABOBOTULINUMTOXINA 500 UNITS IM SOLR: 500.0000 [IU] | Freq: Once | INTRAMUSCULAR | Status: DC

## 2021-05-31 NOTE — Progress Notes (Signed)
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  Symptom onset was without apparent triggering event, she has gradually developed this neck pulling,  manifested primarily with right laterocollis, mild retrocolis, and left shoulder elevation since 2000. She has good relief with regular Botox injection, about 4 times a year since 2003. Last injection was February 18, 2010 by Dr. Marta Antu,  She reported great relief as usual, taking 4- 5 days to get symptom relieve, lasting about 3 months.  I began to inject her since 12.2011, every 3-4 months, dosage has been limited to 150 units of BOTOX A.  She denies neck pain, gait difficulty, but with wearing off Botox, she noticed increased head tremor, and pulling towards her right shoulder.  She developed  transient difficulty lifting her neck up when using BOTOX A 200 units previously, reponding well to decreased dose of 150 units  She was enrolled into IPSEN dysport study, first injection was in April 30th 2013, she later enrolled into open label, 2nd injection was in  May 28th 2013. She did very well.  Recent few weeks,  She had experienced flareup of her rheumatoid arthritis, has received IV infusion. Last injection was in 06/10/2012,  500 units of dysport was dissolved into 2 cc of normal saline. Right longissimus capitus 0.5 cc Right splenius capitis 0.5 cc Right levator scapular 0.5 cc Right iliocostalis 0.5 cc   Dysport research study has completed, she is now coming back for repeat injection, she noticed head bobbing over the past few weeks, she denies significant neck pain, weakness,  Her rheumatoid arthritis is under better control   Last injection was in Sep 2014, she did very well, no neck extension weakness, only rarely neck shaking  UPDATE March 8th 2016: She has lost follow-up since her last  visit December 2014, She came in for treatment her cervical dystonia, she feel the tension of her neck all the time, stiffness. Denied gait difficulty, last injection was September 2014, she received 500 units of Dysport, works well for her, she is continue on medication for anxiety, rheumatoid arthritis She also complains of nighttime snoring, frequent awakening, excessive daytime sleepiness, fatigue, today's ESS is 6, FSS was 19,  UPDATE October 13 2014:   She has missed her scheduled sleep study, continue have worsening neck posturing, posterior neck pain, excessive daytime fatigue and sleepiness  UPDATE Nov 11 2014: She came in for EMG guided Dysport injection today, last injection was December 2014, she complains of worsening head titubation, bilateral hands mild postural tremor, posterior neck pain,  Update February 17 2015 She responded very well to last EMG guided Dysport injection Nov 11 2014, no significant head titubation, no significant neck pain  Update July 22 2015: Last injection was August, now she noticed returning of neck pulling and shaking, posterior neck muscle achy pain  Update October 21 2015: Last EMG guided Dysport injection was in January, she responded very well, only recent couple weeks, she noticed returning of neck pulling, shaking again, She has worsening rheumatoid arthritis, is receiving more frequent treatment, complains fatigue, diffuse body achy pain  Update March 15 2016: She responded very well to previous Dysport injection in April, no significant side effect noticed, only recent few weeks, 5 months from previous injection, she noticed return of head shaking neck muscle tightness.  UPDATE Dec 14th 2017: She responded very well  to previous injection in Sept 2017, no significant side effect noticed.  Update September 13 2016: She did well this last injection in December  Update January 15 2017: She noticed worsening head tremor, she had excessive stress,  her sister passed away from lung cancer in April 2018,  UPDATE Apr 25 2017: She responded very well with previous injection, barely has noticeable head shaking.  Update August 01, 2017: She responded very well to previous dysport injection, only recently began to have recurrent head shaking  UPDATE December 19 2017: She responded well to previous injection  UPDATE Sept 25 2019: She responded very well to previous injection  REVIEW OF SYSTEMS: Full 14 system review of systems performed and notable only for ear pain, joint pain, joint swelling, achy muscles, muscle cramps, rash, tremors, depression, anxiety  ALLERGIES: No Known Allergies  HOME MEDICATIONS: Current Outpatient Medications  Medication Sig Dispense Refill   DULoxetine (CYMBALTA) 60 MG capsule TAKE 1 CAPSULE(60 MG) BY MOUTH DAILY 30 capsule 2   aspirin EC 81 MG tablet Take 1 tablet (81 mg total) by mouth daily. Swallow whole. 90 tablet 3   baclofen (LIORESAL) 10 MG tablet Take 10 mg by mouth daily as needed for muscle spasms.      cephALEXin (KEFLEX) 500 MG capsule Take 1 capsule (500 mg total) by mouth 2 (two) times daily. 14 capsule 0   clonazePAM (KLONOPIN) 0.5 MG tablet Take 1 tablet (0.5 mg total) by mouth 2 (two) times daily as needed. #60 must last 30 days. 60 tablet 3   CRANBERRY PO Take 2 capsules by mouth in the morning and at bedtime.      DYSPORT 500 units SOLR injection INJECT 500 UNITS  INTRAMUSCULARLY EVERY 3  MONTHS (GIVEN AT MD OFFICE, DISCARD UNUSED) 1 each 3   leflunomide (ARAVA) 10 MG tablet TAKE 1 TABLET(10 MG) BY MOUTH DAILY 90 tablet 0   Multiple Vitamins-Minerals (MULTIVITAMINS THER. W/MINERALS) TABS Take 1 tablet by mouth at bedtime.      omeprazole (PRILOSEC) 20 MG capsule Take 1 capsule (20 mg total) by mouth daily. 90 capsule 1   RINVOQ 15 MG TB24 TAKE 1 TABLET BY MOUTH  DAILY 30 tablet 2   rosuvastatin (CRESTOR) 20 MG tablet Take 1 tablet (20 mg total) by mouth daily. 90 tablet 3   traMADol  (ULTRAM) 50 MG tablet Take 1 - 2 every 8 hours as needed for pain. 30 tablet 0   TRELEGY ELLIPTA 200-62.5-25 MCG/INH AEPB INHALE 1 INHALATION BY  MOUTH INTO THE LUNGS DAILY 180 each 3   varenicline (CHANTIX CONTINUING MONTH PAK) 1 MG tablet Take 1 tablet (1 mg total) by mouth 2 (two) times daily. 60 tablet 4   Xylitol (XYLIMELTS MT) Use as directed 2 tablets in the mouth or throat at bedtime as needed (dry mouth).      No current facility-administered medications for this visit.    PAST MEDICAL HISTORY: Past Medical History:  Diagnosis Date   Anxiety    Cervical dystonia    COPD (chronic obstructive pulmonary disease) ([place]) 10/2020   Depression    Fibromyalgia    GERD (gastroesophageal reflux disease)    History of shingles    RA (rheumatoid arthritis) ([facility])    Synovitis of hand     PAST SURGICAL HISTORY: Past Surgical History:  Procedure Laterality Date   TONSILLECTOMY     VAGINAL HYSTERECTOMY  3/08    FAMILY HISTORY: Family History  Problem Relation Age of Onset  Arthritis Mother    Heart Problems Father    Heart disease Father    Heart disease Paternal Uncle        MI    SOCIAL HISTORY:  Social History   Socioeconomic History   Marital status: Married    Spouse name: [name]   Number of children: 2   Years of education: 12   Highest education level: Not on file  Occupational History    Employer: [employer]  Tobacco Use   Smoking status: Former    Packs/day: 0.25    Years: 34.00    Pack years: 8.50    Types: Cigarettes    Start date: 1979    Quit date: 11/16/2020    Years since quitting: 0.5   Smokeless tobacco: Never   Tobacco comments:    5-6 cigs per day/11/16/20  Vaping Use   Vaping Use: Never used  Substance and Sexual Activity   Alcohol use: Yes    Alcohol/week: 0.0 standard drinks    Comment: Rare, twice per year   Drug use: Never   Sexual activity: Yes    Birth control/protection: Surgical    Comment: 1st intercourse 60 yo-Fewer than  5 partners  Other Topics Concern   Not on file  Social History Narrative   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 Right handed.    Caffeine- 4 cups daily.   Social Determinants of Health   Financial Resource Strain: Not on file  Food Insecurity: Not on file  Transportation Needs: Not on file  Physical Activity: Not on file  Stress: Not on file  Social Connections: Not on file  Intimate Partner Violence: Not on file     PHYSICAL EXAM   Vitals:   05/30/21 1305  BP: (!) 141/89  Pulse: 85  Weight: 175 lb (79.4 kg)  Height: 5\' 6"  (1.676 m)   Not recorded     Body mass index is 28.25 kg/m.  PHYSICAL EXAMNIATION: MENTAL STATUS: Speech is normal, normal to casual conversation, and history taking  CRANIAL NERVES: mild retrocollis, right tilt, mild right lateral shift, slight left shoulder elevation  MOTOR: Normal tone and bulk and strength COORDINATION: No dysmetria   GAIT/STANCE: Posture is normal.  DIAGNOSTIC DATA (LABS, IMAGING, TESTING) - I reviewed patient records, labs, notes, testing and imaging myself where available.  ASSESSMENT AND PLAN  Courtney Myers is a 60 y.o. female with long-standing history of cervical dystonia, responded very well to previous EMG guided Dysport injection, last injection was May 2016, responded very well.   Mild retrocollis, right tilt, mild right lateral shift, slight left shoulder elevation  500 of Dysport was dissolved into 2.5 cc of normal saline,  Right longissimus capitus 0. 5 cc Left splenius capitis 0.5 cc Right inferior oblique capitis, 0.5 cc  Left inferior oblique capitis 0.5 cc Left levator scapular 0.5   Patient tolerate the injection well, will return to clinic in 3 months for repeat injection Please dissolve 500 units of Dysport into 2.5 cc of normal saline   Marcial Pacas, M.D. Ph.D.  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  Symptom onset was without apparent triggering event, she has gradually  developed this neck pulling,  manifested primarily with right laterocollis, mild retrocolis, and left shoulder elevation since 2000. She has good relief with regular Botox injection, about 4 times a year since 2003. Last injection was February 18, 2010 by Dr. Marta Antu,  She reported great relief as usual, taking 4- 5 days to get symptom relieve, lasting about 3 months.  I began to inject her since 12.2011, every 3-4 months, dosage has been limited to 150 units of BOTOX A.  She denies neck pain, gait difficulty, but with wearing off Botox, she noticed increased head tremor, and pulling towards her right shoulder.  She developed  transient difficulty lifting her neck up when using BOTOX A 200 units previously, reponding well to decreased dose of 150 units  She was enrolled into IPSEN dysport study, first injection was in April 30th 2013, she later enrolled into open label, 2nd injection was in  May 28th 2013. She did very well.  Recent few weeks,  She had experienced flareup of her rheumatoid arthritis, has received IV infusion. Last injection was in 06/10/2012,  500 units of dysport was dissolved into 2 cc of normal saline. Right longissimus capitus 0.5 cc Right splenius capitis 0.5 cc Right levator scapular 0.5 cc Right iliocostalis 0.5 cc   Dysport research study has completed, she is now coming back for repeat injection, she noticed head bobbing over the past few weeks, she denies significant neck pain, weakness,  Her rheumatoid arthritis is under better control   Last injection was in Sep 2014, she did very well, no neck extension  weakness, only rarely neck shaking  UPDATE March 8th 2016: She has lost follow-up since her last visit December 2014, She came in for treatment her cervical dystonia, she feel the tension of her neck all the time, stiffness. Denied gait difficulty, last injection was September 2014, she received 500 units of Dysport, works well for her, she is continue on medication for anxiety, rheumatoid arthritis She also complains of nighttime snoring, frequent awakening, excessive daytime sleepiness, fatigue, today's ESS is 6, FSS was 57,  UPDATE October 13 2014:   She has missed her scheduled sleep study, continue have worsening neck posturing, posterior neck pain, excessive daytime fatigue and sleepiness  UPDATE Nov 11 2014: She came in for EMG guided Dysport injection today, last injection was December 2014, she complains of worsening head titubation, bilateral hands mild postural tremor, posterior neck pain,  Update February 17 2015 She responded very well to last EMG guided Dysport injection Nov 11 2014, no significant head titubation, no significant neck pain  Update July 22 2015: Last injection was August, now she noticed returning of neck pulling and shaking, posterior neck muscle achy pain  Update October 21 2015: Last EMG guided Dysport injection was in January, she responded very well, only recent couple weeks, she noticed returning of neck pulling, shaking again, She has worsening rheumatoid arthritis, is receiving more frequent treatment, complains fatigue, diffuse body achy pain  Update March 15 2016: She responded very well to previous Dysport injection in April, no significant side effect noticed, only recent few weeks, 5 months from previous injection, she noticed return of head shaking neck muscle tightness.  UPDATE Dec 14th 2017: She responded very well to previous injection in Sept 2017, no significant side effect noticed.  Update September 13 2016: She did well this last injection  in December  Update January 15 2017: She noticed worsening head tremor, she had excessive stress, her sister passed  away from lung cancer in April 2018,  UPDATE Apr 25 2017: She responded very well with previous injection, barely has noticeable head shaking.  Update August 01, 2017: She responded very well to previous dysport injection, only recently began to have recurrent head shaking  UPDATE December 19 2017: She responded well to previous injection  UPDATE Jun 20 2018: She did very well with previous injection  UPDATE October 01 2018: She did well to previous injection  UPDATE January 01 2019: She did well with previous injection  UPDATE Oct 28th 2020: She did well to previous injections  UPDATE Jan 27th 2021: She did well to previous injections  Update October 29, 2019: She did well with previous injection, no significant side effect noted.  Update February 04, 2020, Uses Dysport 500 units, responding well  UPDATE Jun 02 2020: Uses Dysport 500 units, responding well  Update September 28, 2020: She responded well to previous injection, unfortunately she has been a longtime smoker, diagnosed with COPD, chest x-ray showed bibasilar atelectasis, and infiltration, small left pleural effusion on September 08, 2020   REVIEW OF SYSTEMS: Full 14 system review of systems performed and notable only for as above.  All rest review of system were negative.  ALLERGIES: No Known Allergies  HOME MEDICATIONS: Current Outpatient Medications  Medication Sig Dispense Refill   DULoxetine (CYMBALTA) 60 MG capsule TAKE 1 CAPSULE(60 MG) BY MOUTH DAILY 30 capsule 2   aspirin EC 81 MG tablet Take 1 tablet (81 mg total) by mouth daily. Swallow whole. 90 tablet 3   baclofen (LIORESAL) 10 MG tablet Take 10 mg by mouth daily as needed for muscle spasms.      cephALEXin (KEFLEX) 500 MG capsule Take 1 capsule (500 mg total) by mouth 2 (two) times daily. 14 capsule 0   clonazePAM (KLONOPIN) 0.5 MG tablet Take 1  tablet (0.5 mg total) by mouth 2 (two) times daily as needed. #60 must last 30 days. 60 tablet 3   CRANBERRY PO Take 2 capsules by mouth in the morning and at bedtime.      DYSPORT 500 units SOLR injection INJECT 500 UNITS  INTRAMUSCULARLY EVERY 3  MONTHS (GIVEN AT MD OFFICE, DISCARD UNUSED) 1 each 3   leflunomide (ARAVA) 10 MG tablet TAKE 1 TABLET(10 MG) BY MOUTH DAILY 90 tablet 0   Multiple Vitamins-Minerals (MULTIVITAMINS THER. W/MINERALS) TABS Take 1 tablet by mouth at bedtime.      omeprazole (PRILOSEC) 20 MG capsule Take 1 capsule (20 mg total) by mouth daily. 90 capsule 1   RINVOQ 15 MG TB24 TAKE 1 TABLET BY MOUTH  DAILY 30 tablet 2   rosuvastatin (CRESTOR) 20 MG tablet Take 1 tablet (20 mg total) by mouth daily. 90 tablet 3   traMADol (ULTRAM) 50 MG tablet Take 1 - 2 every 8 hours as needed for pain. 30 tablet 0   TRELEGY ELLIPTA 200-62.5-25 MCG/INH AEPB INHALE 1 INHALATION BY  MOUTH INTO THE LUNGS DAILY 180 each 3   varenicline (CHANTIX CONTINUING MONTH PAK) 1 MG tablet Take 1 tablet (1 mg total) by mouth 2 (two) times daily. 60 tablet 4   Xylitol (XYLIMELTS MT) Use as directed 2 tablets in the mouth or throat at bedtime as needed (dry mouth).      No current facility-administered medications for this visit.    PAST MEDICAL HISTORY: Past Medical History:  Diagnosis Date   Anxiety    Cervical dystonia    COPD (chronic obstructive pulmonary disease) ([place]) 10/2020  Depression    Fibromyalgia    GERD (gastroesophageal reflux disease)    History of shingles    RA (rheumatoid arthritis) ([facility])    Synovitis of hand     PAST SURGICAL HISTORY: Past Surgical History:  Procedure Laterality Date   TONSILLECTOMY     VAGINAL HYSTERECTOMY  3/08    FAMILY HISTORY: Family History  Problem Relation Age of Onset   Arthritis Mother    Heart Problems Father    Heart disease Father    Heart disease Paternal Uncle        MI    SOCIAL HISTORY:  Social History   Socioeconomic  History   Marital status: Married    Spouse name: [name]   Number of children: 2   Years of education: 12   Highest education level: Not on file  Occupational History    Employer: [employer]  Tobacco Use   Smoking status: Former    Packs/day: 0.25    Years: 34.00    Pack years: 8.50    Types: Cigarettes    Start date: 1979    Quit date: 11/16/2020    Years since quitting: 0.5   Smokeless tobacco: Never   Tobacco comments:    5-6 cigs per day/11/16/20  Vaping Use   Vaping Use: Never used  Substance and Sexual Activity   Alcohol use: Yes    Alcohol/week: 0.0 standard drinks    Comment: Rare, twice per year   Drug use: Never   Sexual activity: Yes    Birth control/protection: Surgical    Comment: 1st intercourse 60 yo-Fewer than 5 partners  Other Topics Concern   Not on file  Social History Narrative   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 Right handed.    Caffeine- 4 cups daily.   Social Determinants of Health   Financial Resource Strain: Not on file  Food Insecurity: Not on file  Transportation Needs: Not on file  Physical Activity: Not on file  Stress: Not on file  Social Connections: Not on file  Intimate Partner Violence: Not on file     PHYSICAL EXAM   Vitals:   05/30/21 1305  BP: (!) 141/89  Pulse: 85  Weight: 175 lb (79.4 kg)  Height: 5\' 6"  (1.676 m)   Not recorded     Body mass index is 28.25 kg/m.  PHYSICAL EXAMNIATION:   Mild retrocollis, right tilt, mild right lateral shift, she has moderate tenderness at bilateral levator scapular muscle   ASSESSMENT AND PLAN  Kariann Wecker is a 60 y.o. female with long-standing history of cervical dystonia, responded very well to previous EMG guided Dysport injection, l  Mild retrocollis, right tilt, mild left turn slight left shoulder elevation, frequent no-no titubation  500 of Dysport was dissolved  into 2.5 cc of normal saline,  Right longissimus capitus 0. 5cc Right splenius capitis 0.25 c Right inferior oblique capitis 0.5 cc Left splenius cervix 0.5 Left splenius capitis 0.25 Right splenius cervix 0.25 cc Left levator scapular 0. 25 cc   Patient tolerate the injection well, will return to clinic in 3 months for repeat injection Please dissolve 500 units of Dysport into 2.5 cc of normal saline   Marcial Pacas, M.D. Ph.D.  [facility] [address] [place], [place] [address] Ph: [phone] Fax: [phone]

## 2021-05-31 NOTE — Telephone Encounter (Signed)
Received fax from [facility]  stating they have been trying to reach patient to refill Rinvoq. They have been unsuccessful in reaching patient. Call back number is [phone]. Patient advised to contact the pharmacy at [phone] to set up shipment.

## 2021-06-04 ENCOUNTER — Telehealth: Payer: 59 | Admitting: Nurse Practitioner

## 2021-06-04 DIAGNOSIS — N3000 Acute cystitis without hematuria: Secondary | ICD-10-CM | POA: Diagnosis not present

## 2021-06-05 MED ORDER — CEPHALEXIN 500 MG PO CAPS: 500.0000 mg | ORAL_CAPSULE | Freq: Two times a day (BID) | ORAL | 0 refills | Status: AC

## 2021-06-05 NOTE — Progress Notes (Signed)
I have spent at least 5 minutes reviewing and documenting in the patient's chart.  

## 2021-06-05 NOTE — Progress Notes (Signed)
E-Visit for Urinary Problems  We are sorry that you are not feeling well.  Here is how we plan to help!  Based on what you shared with me it looks like you most likely have a simple urinary tract infection.  A UTI (Urinary Tract Infection) is a bacterial infection of the bladder.  Most cases of urinary tract infections are simple to treat but a key part of your care is to encourage you to drink plenty of fluids and watch your symptoms carefully.  I have prescribed Keflex 500 mg twice a day for 7 days.  Your symptoms should gradually improve. Call us if the burning in your urine worsens, you develop worsening fever, back pain or pelvic pain or if your symptoms do not resolve after completing the antibiotic.  Urinary tract infections can be prevented by drinking plenty of water to keep your body hydrated.  Also be sure when you wipe, wipe from front to back and don't hold it in!  If possible, empty your bladder every 4 hours.  I have reviewed your records and I see you were treated for the same/similar symptoms in September. If your symptoms reoccur or they begin to develop more frequently, I would recommend following up with your primary care physician for further evaluation to include a urine culture. HOME CARE Drink plenty of fluids Compete the full course of the antibiotics even if the symptoms resolve Remember, when you need to go.go. Holding in your urine can increase the likelihood of getting a UTI! GET HELP RIGHT AWAY IF: You cannot urinate You get a high fever Worsening back pain occurs You see blood in your urine You feel sick to your stomach or throw up You feel like you are going to pass out  MAKE SURE YOU  Understand these instructions. Will watch your condition. Will get help right away if you are not doing well or get worse.   Thank you for choosing an e-visit.  Your e-visit answers were reviewed by a board certified advanced clinical practitioner to complete your  personal care plan. Depending upon the condition, your plan could have included both over the counter or prescription medications.  Please review your pharmacy choice. Make sure the pharmacy is open so you can pick up prescription now. If there is a problem, you may contact your provider through [employer] and have the prescription routed to another pharmacy.  Your safety is important to [place]. If you have drug allergies check your prescription carefully.   For the next 24 hours you can use MyChart to ask questions about today's visit, request a non-urgent call back, or ask for a work or school excuse. You will get an email in the next two days asking about your experience. I hope that your e-visit has been valuable and will speed your recovery.

## 2021-06-14 ENCOUNTER — Telehealth (HOSPITAL_BASED_OUTPATIENT_CLINIC_OR_DEPARTMENT_OTHER): Payer: Self-pay | Admitting: Cardiology

## 2021-06-14 NOTE — Telephone Encounter (Signed)
Left message @ 10:20 am for patient to call and discuss rescheduling the 06/15/21 8:00 am appointment with Dr. Charlsie Quest, RN called on 06/13/21 and left message for patient to call

## 2021-06-15 ENCOUNTER — Ambulatory Visit (HOSPITAL_BASED_OUTPATIENT_CLINIC_OR_DEPARTMENT_OTHER): Payer: 59 | Admitting: Cardiology

## 2021-06-22 ENCOUNTER — Other Ambulatory Visit: Payer: Self-pay | Admitting: Rheumatology

## 2021-06-22 DIAGNOSIS — Z79899 Other long term (current) drug therapy: Secondary | ICD-10-CM

## 2021-06-22 NOTE — Telephone Encounter (Signed)
Next Visit: 07/27/2021   Last Visit: 02/23/2021   Last Fill: 03/28/2021  Dx:  Rheumatoid arthritis of multiple sites with negative rheumatoid factor   Current Dose per office note on 02/23/2021: Rinvoq 15 mg 1 tablet by mouth daily   Labs: 02/24/2021 CBC and CMP WNL   TB Gold: 02/24/2021 Neg   Patient advised she is due to update labs. Patient states she will come this afternoon to update.   Okay to refill Rinvoq?

## 2021-06-23 ENCOUNTER — Other Ambulatory Visit: Payer: Self-pay | Admitting: *Deleted

## 2021-06-23 DIAGNOSIS — Z79899 Other long term (current) drug therapy: Secondary | ICD-10-CM

## 2021-06-23 LAB — CBC WITH DIFFERENTIAL/PLATELET
Absolute Monocytes: 490 cells/uL (ref 200–950)
Basophils Absolute: 81 cells/uL (ref 0–200)
Basophils Relative: 1.3 %
Eosinophils Absolute: 50 cells/uL (ref 15–500)
Eosinophils Relative: 0.8 %
HCT: 36.6 % (ref 35.0–45.0)
Hemoglobin: 12.3 g/dL (ref 11.7–15.5)
Lymphs Abs: 2189 cells/uL (ref 850–3900)
MCH: 29.9 pg (ref 27.0–33.0)
MCHC: 33.6 g/dL (ref 32.0–36.0)
MCV: 89.1 fL (ref 80.0–100.0)
MPV: 12.4 fL (ref 7.5–12.5)
Monocytes Relative: 7.9 %
Neutro Abs: 3391 cells/uL (ref 1500–7800)
Neutrophils Relative %: 54.7 %
Platelets: 241 10*3/uL (ref 140–400)
RBC: 4.11 10*6/uL (ref 3.80–5.10)
RDW: 12.4 % (ref 11.0–15.0)
Total Lymphocyte: 35.3 %
WBC: 6.2 10*3/uL (ref 3.8–10.8)

## 2021-06-23 LAB — COMPLETE METABOLIC PANEL WITH GFR
AG Ratio: 1.6 (calc) (ref 1.0–2.5)
ALT: 17 U/L (ref 6–29)
AST: 22 U/L (ref 10–35)
Albumin: 4.1 g/dL (ref 3.6–5.1)
Alkaline phosphatase (APISO): 72 U/L (ref 37–153)
BUN/Creatinine Ratio: 13 (calc) (ref 6–22)
BUN: 15 mg/dL (ref 7–25)
CO2: 28 mmol/L (ref 20–32)
Calcium: 9.5 mg/dL (ref 8.6–10.4)
Chloride: 103 mmol/L (ref 98–110)
Creat: 1.15 mg/dL — ABNORMAL HIGH (ref 0.50–1.05)
Globulin: 2.6 g/dL (calc) (ref 1.9–3.7)
Glucose, Bld: 112 mg/dL — ABNORMAL HIGH (ref 65–99)
Potassium: 4 mmol/L (ref 3.5–5.3)
Sodium: 140 mmol/L (ref 135–146)
Total Bilirubin: 0.4 mg/dL (ref 0.2–1.2)
Total Protein: 6.7 g/dL (ref 6.1–8.1)
eGFR: 54 mL/min/{1.73_m2} — ABNORMAL LOW (ref >=60)

## 2021-06-24 ENCOUNTER — Other Ambulatory Visit: Payer: Self-pay

## 2021-06-24 DIAGNOSIS — Z79899 Other long term (current) drug therapy: Secondary | ICD-10-CM

## 2021-06-24 NOTE — Progress Notes (Signed)
Creatinine is elevated.  Patient should avoid all NSAIDs.  She should increase water intake.  Repeat BMP with GFR in 1 to 2 weeks.

## 2021-06-29 ENCOUNTER — Other Ambulatory Visit: Payer: Self-pay | Admitting: Cardiology

## 2021-06-29 ENCOUNTER — Other Ambulatory Visit: Payer: Self-pay | Admitting: Physician Assistant

## 2021-06-29 NOTE — Telephone Encounter (Signed)
Next Visit: 07/27/2021   Last Visit: 02/23/2021   Last Fill: 03/28/2021  Dx:  Rheumatoid arthritis of multiple sites with negative rheumatoid factor   Current Dose per office note on 02/23/2021: Arava 10 mg 1 tablet by mouth daily   Labs: 06/23/2021 Creatinine is elevated.  Patient should avoid all NSAIDs.  She should increase water intake.   Okay to refill Arava?

## 2021-07-07 ENCOUNTER — Other Ambulatory Visit: Payer: Self-pay | Admitting: *Deleted

## 2021-07-07 DIAGNOSIS — Z79899 Other long term (current) drug therapy: Secondary | ICD-10-CM

## 2021-07-07 LAB — BASIC METABOLIC PANEL WITH GFR
BUN/Creatinine Ratio: 13 (calc) (ref 6–22)
BUN: 15 mg/dL (ref 7–25)
CO2: 28 mmol/L (ref 20–32)
Calcium: 9.8 mg/dL (ref 8.6–10.4)
Chloride: 102 mmol/L (ref 98–110)
Creat: 1.12 mg/dL — ABNORMAL HIGH (ref 0.50–1.05)
Glucose, Bld: 82 mg/dL (ref 65–99)
Potassium: 4.3 mmol/L (ref 3.5–5.3)
Sodium: 138 mmol/L (ref 135–146)
eGFR: 56 mL/min/{1.73_m2} — ABNORMAL LOW (ref >=60)

## 2021-07-08 ENCOUNTER — Telehealth: Payer: Self-pay | Admitting: *Deleted

## 2021-07-08 DIAGNOSIS — Z79899 Other long term (current) drug therapy: Secondary | ICD-10-CM

## 2021-07-08 NOTE — Progress Notes (Signed)
Creatinine is better but is still elevated.  Patient was clinically doing well without any joint inflammation or swelling.  She may stop leflunomide.  We will recheck labs in 2 months.  Please encourage increased water intake.

## 2021-07-08 NOTE — Telephone Encounter (Signed)
-----   Message from Bo Merino, MD sent at 07/08/2021  6:16 AM EST ----- Creatinine is better but is still elevated.  Patient was clinically doing well without any joint inflammation or swelling.  She may stop leflunomide.  We will recheck labs in 2 months.  Please encourage increased water intake.

## 2021-07-13 NOTE — Progress Notes (Signed)
Office Visit Note  Patient: Courtney Myers             Date of Birth: [birth year]           MRN: [id number]             PCP: Susy Frizzle, MD Referring: Susy Frizzle, MD Visit Date: 07/27/2021 Occupation: @GUAROCC @  Subjective:  Dizziness   History of Present Illness: Courtney Myers is a 61 y.o. female with history of seronegative rheumatoid arthritis and fibromyalgia.  Patient is currently taking Rinvoq 15 mg 1 tablet by mouth daily.  She has not had any signs or symptoms of a rheumatoid arthritis flare recently.  She is not experiencing any joint swelling at this time.  She experiences occasional fatigue and myalgias secondary to fibromyalgia especially with weather changes.  According to the patient she has had some left ear fullness since November 2022.  This past weekend she was experiencing some dizziness and had a fall on Saturday.  She has an upcoming appointment with her PCP this week for further evaluation.    Activities of Daily Living:  Patient reports morning stiffness for 0 minutes.   Patient Denies nocturnal pain.  Difficulty dressing/grooming: Denies Difficulty climbing stairs: Denies Difficulty getting out of chair: Denies Difficulty using hands for taps, buttons, cutlery, and/or writing: Denies  Review of Systems  Constitutional:  Positive for fatigue.  HENT:  Positive for mouth dryness and nose dryness. Negative for mouth sores.   Eyes:  Negative for pain, itching, visual disturbance and dryness.  Respiratory:  Negative for cough, hemoptysis, shortness of breath and difficulty breathing.   Cardiovascular:  Negative for chest pain, palpitations, hypertension and swelling in legs/feet.  Gastrointestinal:  Negative for blood in stool, constipation and diarrhea.  Endocrine: Positive for increased urination.  Genitourinary:  Positive for difficulty urinating. Negative for painful urination.  Musculoskeletal:  Positive for myalgias and  myalgias. Negative for joint pain, joint pain, joint swelling, muscle weakness, morning stiffness and muscle tenderness.  Skin:  Negative for color change, pallor, rash, hair loss, nodules/bumps, redness, skin tightness, ulcers and sensitivity to sunlight.  Allergic/Immunologic: Positive for susceptible to infections.  Neurological:  Positive for dizziness and weakness. Negative for numbness, headaches and memory loss.  Hematological:  Positive for bruising/bleeding tendency. Negative for swollen glands.  Psychiatric/Behavioral:  Negative for depressed mood, confusion and sleep disturbance. The patient is not nervous/anxious.    PMFS History:  Patient Active Problem List   Diagnosis Date Noted   Encounter for screening for lung cancer 12/29/2019   Excessive sleepiness 09/08/2014   Fibromyalgia syndrome 08/25/2014   RA (rheumatoid arthritis) (HCC)    GERD (gastroesophageal reflux disease)    Anxiety    Depression    History of shingles    Synovitis of hand    Cervical dystonia 12/04/2012    Past Medical History:  Diagnosis Date   Anxiety    Cervical dystonia    COPD (chronic obstructive pulmonary disease) ([place]) 10/2020   Depression    Fibromyalgia    GERD (gastroesophageal reflux disease)    History of shingles    RA (rheumatoid arthritis) (HCC)    Synovitis of hand     Family History  Problem Relation Age of Onset   Arthritis Mother    Heart Problems Father    Heart disease Father    Heart disease Paternal Uncle        MI   Past Surgical History:  Procedure Laterality  Date   TONSILLECTOMY     VAGINAL HYSTERECTOMY  3/08   Social History   Social History Narrative   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 Right handed.    Caffeine- 4 cups daily.   Immunization History  Administered Date(s) Administered   Influenza,inj,Quad PF,6+ Mos 05/25/2018, 04/03/2019, 04/26/2020    Influenza-Unspecified 06/24/2014, 05/01/2015, 04/10/2016, 04/06/2017   Moderna Sars-Covid-2 Vaccination 08/29/2019, 09/26/2019, 06/19/2020   Pneumococcal Conjugate-13 05/01/2015   Pneumococcal Polysaccharide-23 04/10/2016   Tdap 09/09/2015     Objective: Vital Signs: BP (!) 143/83 (BP Location: Left Arm, Patient Position: Sitting, Cuff Size: Normal)    Pulse 87    Ht 5\' 6"  (1.676 m)    Wt 184 lb (83.5 kg)    BMI 29.70 kg/m    Physical Exam Vitals and nursing note reviewed.  Constitutional:      Appearance: She is well-developed.  HENT:     Head: Normocephalic and atraumatic.  Eyes:     Conjunctiva/sclera: Conjunctivae normal.  Pulmonary:     Effort: Pulmonary effort is normal.  Abdominal:     Palpations: Abdomen is soft.  Musculoskeletal:     Cervical back: Normal range of motion.  Skin:    General: Skin is warm and dry.     Capillary Refill: Capillary refill takes less than 2 seconds.  Neurological:     Mental Status: She is alert and oriented to person, place, and time.  Psychiatric:        Behavior: Behavior normal.     Musculoskeletal Exam: C-spine, thoracic spine, lumbar spine have good range of motion with no discomfort.  Shoulder joints, elbow joints, wrist joints, MCPs, PIPs, DIPs have good range of motion with no synovitis.  Complete fist formation bilaterally.  Hip joints have good range of motion with no groin pain.  Knee joints have good range of motion with no warmth or effusion.  Ankle joints have good range of motion with no tenderness or joint swelling.  CDAI Exam: CDAI Score: 0.4  Patient Global: 2 mm; Provider Global: 2 mm Swollen: 0 ; Tender: 0  Joint Exam 07/27/2021   No joint exam has been documented for this visit   There is currently no information documented on the homunculus. Go to the Rheumatology activity and complete the homunculus joint exam.  Investigation: No additional findings.  Imaging: No results found.  Recent Labs: Lab Results   Component Value Date   WBC 6.2 06/23/2021   HGB 12.3 06/23/2021   PLT 241 06/23/2021   NA 138 07/07/2021   K 4.3 07/07/2021   CL 102 07/07/2021   CO2 28 07/07/2021   GLUCOSE 82 07/07/2021   BUN 15 07/07/2021   CREATININE 1.12 (H) 07/07/2021   BILITOT 0.4 06/23/2021   ALKPHOS 91 11/18/2020   AST 22 06/23/2021   ALT 17 06/23/2021   PROT 6.7 06/23/2021   ALBUMIN 4.2 11/18/2020   CALCIUM 9.8 07/07/2021   GFRAA 61 11/02/2020   QFTBGOLD Negative 09/14/2015   QFTBGOLDPLUS NEGATIVE 02/23/2021    Speciality Comments: Remicade 6mg /kg every 6 weeks 07/13-09/21 Rinvoq 15 mg qd 04/28/20 MTX - dcd due to increase in [place] started 06/21/20   Procedures:  No procedures performed Allergies: Patient has no known allergies.    Assessment / Plan:     Visit Diagnoses: Rheumatoid arthritis of multiple sites with negative rheumatoid factor ([place]): She has no joint tenderness or synovitis on examination  today.  She has clinically been doing well taking Rinvoq 15 mg 1 tablet by mouth daily.  On 07/07/2021 the patient was advised to discontinue Arava due to ongoing elevation in creatinine.  She was previously doing well on combination therapy without any recent flares.  She has not noticed any increased joint pain or inflammation since discontinuing Arava.  She will remain on Rinvoq as monotherapy.  She was advised to notify us if she develops increased joint pain or joint swelling.  She will follow-up in the office in 5 months.  High risk medication use - Rinvoq 15 mg 1 tablet by mouth daily (started 04/28/20).  Currently holding arava due to elevated creatinine on 07/07/2021.  (Initially started [place] 06/21/20).  Inadequate response to Remicade 6mg /kg.  Not a good candidate for methotrexate due to elevated creatinine and low GFR. CBC and CMP drawn on 06/23/2021.  BMP updated on 07/07/2021.  Creatinine had improved but remains elevated at which time she was advised to discontinue Arava.  Her next lab  work will be due in March and every 3 months to monitor for drug toxicity.  TB Gold negative on 02/23/2021 and will continue to be monitored yearly. Lipid panel within normal limits on 11/18/2020.  Advised to forward lipid panels drawn by her PCP for [place] to continue to monitor cholesterol panel closely. Discussed the importance of close blood pressure monitoring.  Her blood pressure was 143/83 today in the office. Discussed the black box warning for jak inhibitors including the increased risk for malignancy and major cardiovascular events while taking rinvoq.  Association of heart disease with rheumatoid arthritis was discussed. Need to monitor blood pressure, cholesterol, and to exercise 30-60 minutes on daily basis was discussed.  Sicca complex ([place]): She has ongoing mouth dryness but has not had any eye dryness recently.  No progression in symptoms noted.  History of COPD - Evaluated by Dr. Vaughan Browner on 09/23/20.  She had a high resolution chest CT performed on 09/29/20 which was consistent with smoking related bronchiolitis.   Fibromyalgia -She continues to experience intermittent myalgias and muscle tenderness due to fibromyalgia.  She attributes the symptoms to weather changes.  Overall her symptoms have been manageable.  She takes Cymbalta 60 mg 1 capsule by mouth daily. A refill of cymbalta was sent to the pharmacy.  Discussed the importance of regular exercise and good sleep hygiene.  Chronic fatigue: Chronic and secondary to insomnia.  Discussed the importance of regular exercise.   Recurrent UTI: Discussed the importance of holding Rinvoq if she develops signs or symptoms of a UTI.  She can resume Rinvoq once the infection has completely cleared.  She was advised to notify us if she continues to have recurrent UTIs.  Primary insomnia: She continues to have difficulty sleeping at night.  Discussed the importance of good sleep hygiene.   Other medical conditions are listed as follows:  History  of gastroesophageal reflux (GERD)  Anxiety and depression: She remains on Cymbalta as prescribed.  A refill of Cymbalta was sent to the pharmacy today.  Dizziness: She has been experiencing fullness in the left ear since November 2022.  She has not had any fevers, chills, nasal congestion, or swollen lymph nodes.  This past weekend she started to experience episodic dizziness.  On Saturday she had a fall with no injury.  She has an upcoming appointment with her PCP this week for further evaluation.   She was advised to call her PCP today to see if she  can be evaluated urgently.  She was experiencing symptoms of vertigo well in the office today.  She was unable to drive herself to her appointment due to the symptoms. She has not tried taking meclizine or any over-the-counter products for symptomatic relief yet.  She has notified her neurologist who also recommended following up with her PCP first.  Orders: No orders of the defined types were placed in this encounter.  Meds ordered this encounter  Medications   DULoxetine (CYMBALTA) 60 MG capsule    Sig: Take 1 capsule by mouth daily    Dispense:  30 capsule    Refill:  2     Follow-Up Instructions: Return in about 5 months (around 12/25/2021) for Rheumatoid arthritis, Fibromyalgia.   Ofilia Neas, PA-C  Note - This record has been created using Dragon software.  Chart creation errors have been sought, but may not always  have been located. Such creation errors do not reflect on  the standard of medical care.

## 2021-07-18 ENCOUNTER — Ambulatory Visit (HOSPITAL_BASED_OUTPATIENT_CLINIC_OR_DEPARTMENT_OTHER): Payer: 59 | Admitting: Cardiology

## 2021-07-18 ENCOUNTER — Encounter (HOSPITAL_BASED_OUTPATIENT_CLINIC_OR_DEPARTMENT_OTHER): Payer: Self-pay | Admitting: Cardiology

## 2021-07-18 ENCOUNTER — Other Ambulatory Visit: Payer: Self-pay

## 2021-07-18 VITALS — BP 118/82 | HR 78 | Ht 66.0 in | Wt 185.5 lb

## 2021-07-18 DIAGNOSIS — I700 Atherosclerosis of aorta: Secondary | ICD-10-CM | POA: Diagnosis not present

## 2021-07-18 DIAGNOSIS — I493 Ventricular premature depolarization: Secondary | ICD-10-CM

## 2021-07-18 DIAGNOSIS — Z8249 Family history of ischemic heart disease and other diseases of the circulatory system: Secondary | ICD-10-CM | POA: Diagnosis not present

## 2021-07-18 DIAGNOSIS — I2510 Atherosclerotic heart disease of native coronary artery without angina pectoris: Secondary | ICD-10-CM | POA: Diagnosis not present

## 2021-07-18 NOTE — Progress Notes (Signed)
Cardiology Office Note:    Date:  07/18/2021   ID:  Ward Chatters, DOB 1960-08-02, MRN [id number]  PCP:  Susy Frizzle, MD  Cardiologist:  Buford Dresser, MD  Referring MD: Susy Frizzle, MD   CC: Follow-up  History of Present Illness:    Courtney Myers is a 61 y.o. female with a a hx of rheumatoid arthritis, chest pain who was initially seen 06/16/2020 as a new consult at the request of Susy Frizzle, MD for the evaluation and management of cardiovascular risk. She presents today for follow-up.  Today: Overall, she is feeling well.   She endorses dizzy spells that she describes as "moving." This occurs about one day a week, and may occur while in bed or at other times. Also, she states that her ear "feels funny." Of note, her symptoms seemed to develop around the time she began taking Chantix.  She has successfully quit smoking and states that everything feels better. Her senses of taste and smell have improved. Pulmonology continues to follow her progress and she uses her inhaler regularly. However, she believes that her pulmonary inflammation has improved as well.  Lately the most strenuous activity she is able to do includes moving tables and chairs at work. She moves cases of cleaning products, and will walk her dogs at home. She has wanted to try using a treadmill to build up her lung capacity.  She denies any palpitations, or chest pain. No headaches, syncope, orthopnea, PND, or lower extremity edema. No bleeding issues on aspirin.   Past Medical History:  Diagnosis Date   Anxiety    Cervical dystonia    COPD (chronic obstructive pulmonary disease) ([place]) 10/2020   Depression    Fibromyalgia    GERD (gastroesophageal reflux disease)    History of shingles    RA (rheumatoid arthritis) (HCC)    Synovitis of hand     Past Surgical History:  Procedure Laterality Date   TONSILLECTOMY     VAGINAL HYSTERECTOMY  3/08    Current  Medications: Current Outpatient Medications on File Prior to Visit  Medication Sig   aspirin EC 81 MG tablet Take 1 tablet (81 mg total) by mouth daily. Swallow whole.   baclofen (LIORESAL) 10 MG tablet Take 10 mg by mouth daily as needed for muscle spasms.    clonazePAM (KLONOPIN) 0.5 MG tablet Take 1 tablet (0.5 mg total) by mouth 2 (two) times daily as needed. #60 must last 30 days.   DULoxetine (CYMBALTA) 60 MG capsule TAKE 1 CAPSULE(60 MG) BY MOUTH DAILY   DYSPORT 500 units SOLR injection INJECT 500 UNITS  INTRAMUSCULARLY EVERY 3  MONTHS (GIVEN AT MD OFFICE, DISCARD UNUSED)   Multiple Vitamins-Minerals (MULTIVITAMINS THER. W/MINERALS) TABS Take 1 tablet by mouth at bedtime.    omeprazole (PRILOSEC) 20 MG capsule Take 1 capsule (20 mg total) by mouth daily.   RINVOQ 15 MG TB24 TAKE 1 TABLET BY MOUTH  DAILY   rosuvastatin (CRESTOR) 20 MG tablet TAKE 1 TABLET(20 MG) BY MOUTH DAILY   traMADol (ULTRAM) 50 MG tablet Take 1 - 2 every 8 hours as needed for pain.   TRELEGY ELLIPTA 200-62.5-25 MCG/INH AEPB INHALE 1 INHALATION BY  MOUTH INTO THE LUNGS DAILY   varenicline (CHANTIX CONTINUING MONTH PAK) 1 MG tablet Take 1 tablet (1 mg total) by mouth 2 (two) times daily.   Xylitol (XYLIMELTS MT) Use as directed 2 tablets in the mouth or throat at bedtime as needed (dry mouth).  No current facility-administered medications on file prior to visit.     Allergies:   Patient has no known allergies.   Social History   Tobacco Use   Smoking status: Former    Packs/day: 0.25    Years: 34.00    Pack years: 8.50    Types: Cigarettes    Start date: 1979    Quit date: 11/16/2020    Years since quitting: 0.6   Smokeless tobacco: Never   Tobacco comments:    5-6 cigs per day/11/16/20  Vaping Use   Vaping Use: Never used  Substance Use Topics   Alcohol use: Yes    Alcohol/week: 0.0 standard drinks    Comment: Rare, twice per year   Drug use: Never    Family History: family history includes  Arthritis in her mother; Heart Problems in her father; Heart disease in her father and paternal uncle.  ROS:   Please see the history of present illness. (+) Dizzy spells (+) Shortness of breath All other systems are reviewed and negative.    EKGs/Labs/Other Studies Reviewed:    The following studies were reviewed today:  CT Chest 09/29/2020: COMPARISON:  06/16/2020 chest CT angiogram.   FINDINGS: Cardiovascular: Normal heart size. No significant pericardial effusion/thickening. Left anterior descending coronary atherosclerosis. Great vessels are normal in course and caliber.   Mediastinum/Nodes: Hypodense 2.2 cm anterior left thyroid nodule, stable. Unremarkable esophagus. No pathologically enlarged axillary, mediastinal or hilar lymph nodes, noting limited sensitivity for the detection of hilar adenopathy on this noncontrast study.   Lungs/Pleura: No pneumothorax. No pleural effusion. Moderate centrilobular and paraseptal emphysema with mild diffuse bronchial wall thickening. No acute consolidative airspace disease or lung masses. A few tiny scattered solid peripheral right pulmonary nodules, largest 0.3 cm in the right upper lobe (series 3/image 51), stable. No new significant pulmonary nodules. Mild patchy ground-glass opacities in the peripheral upper lobes bilaterally with a confluent ground-glass centrilobular micronodular character, unchanged. No significant regions of subpleural reticulation, traction bronchiectasis, architectural distortion or frank honeycombing. No significant lobular air trapping or evidence of tracheobronchomalacia on the expiration sequence.   Upper abdomen: No acute abnormality. Stable 1.4 cm left adrenal adenoma with density -6 HU.   Musculoskeletal: No aggressive appearing focal osseous lesions. Mild thoracic spondylosis.   IMPRESSION: 1. Mild patchy confluent centrilobular ground-glass micronodularity in the upper lobes, stable,  compatible with smoking related bronchiolitis (respiratory bronchiolitis if no current pulmonary symptoms versus respiratory bronchiolitis-interstitial lung disease (RB-ILD) if current pulmonary symptoms are present). 2. One vessel coronary atherosclerosis. 3. Hypodense 2.2 cm anterior left thyroid nodule, stable. Recommend thyroid US (ref: J Am Coll Radiol. 2015 Feb;12(2): 143-50). 4. Stable left adrenal adenoma. 5. Emphysema (ICD10-J43.9).  CT noncon 12/29/19 noted mild aortic atherosclerosis and LAD coronary calcium.  EKG:  ECG is personally reviewed today 07/18/2021: sinus rhythm with frequent PVCs 11/18/2020: EKG was not ordered. 09/08/2020: Sinus tachycardia at 101 bpm, borderline RAE  Recent Labs: 06/23/2021: ALT 17; Hemoglobin 12.3; Platelets 241 07/07/2021: BUN 15; Creat 1.12; Potassium 4.3; Sodium 138   Recent Lipid Panel    Component Value Date/Time   CHOL 149 11/18/2020 0839   TRIG 87 11/18/2020 0839   HDL 61 11/18/2020 0839   CHOLHDL 2.4 11/18/2020 0839   CHOLHDL 4.6 06/14/2020 0812   VLDL 14 09/09/2015 0826   LDLCALC 72 11/18/2020 0839   LDLCALC 159 (H) 06/14/2020 0812    Physical Exam:    VS:  BP 118/82 (BP Location: Left Arm, Patient Position: Sitting, Cuff  Size: Normal)    Pulse 78    Ht 5\' 6"  (1.676 m)    Wt 185 lb 8 oz (84.1 kg)    BMI 29.94 kg/m     Wt Readings from Last 3 Encounters:  07/18/21 185 lb 8 oz (84.1 kg)  05/30/21 175 lb (79.4 kg)  03/29/21 175 lb (79.4 kg)    GEN: Well nourished, well developed in no acute distress HEENT: Normal, moist mucous membranes NECK: No JVD appreciated CARDIAC: intermittent early beats (every 4th or 5th beat) with otherwise regular rhythm, normal S1 and S2, no rubs or gallops. No murmur. VASCULAR: Radial and DP pulses 2+ bilaterally. No carotid bruits RESPIRATORY:  clear to auscultation without rales, wheezing or rhonchi  ABDOMEN: Soft, non-tender, non-distended MUSCULOSKELETAL:  Ambulates independently SKIN: Warm  and dry, no edema NEUROLOGIC:  Alert and oriented x 3. No focal neuro deficits noted. PSYCHIATRIC:  Normal affect    ASSESSMENT:    1. PVC (premature ventricular contraction)   2. Coronary artery calcification seen on CT scan   3. Aortic atherosclerosis ([place])   4. Family history of heart disease     PLAN:    Family history of heart disease (father's side) Long term inflammatory disease (rheumatoid arthritis) Prior tobacco use Aortic atherosclerosis Coronary calcium: -no further chest pain -tolerating aspirin/statin. Last LDL 72 -counseled on red flag warning signs that need immediate medical attention  PVCs -she is asymptomatic  -she has vertigo symptoms but no syncope -we discussed a monitor. She will contact me if she develops symptoms and we will pursue a monitor at that time  Cardiac risk counseling and prevention recommendations: -recommend heart healthy/Mediterranean diet, with whole grains, fruits, vegetable, fish, lean meats, nuts, and olive oil. Limit salt. -recommend moderate walking, 3-5 times/week for 30-50 minutes each session. Aim for at least 150 minutes.week. Goal should be pace of 3 miles/hours, or walking 1.5 miles in 30 minutes -recommend avoidance of tobacco products. Avoid excess alcohol  Plan for follow up: 6 months per patient preference, or sooner as needed.   Medication Adjustments/Labs and Tests Ordered: Current medicines are reviewed at length with the patient today.  Concerns regarding medicines are outlined above.   Orders Placed This Encounter  Procedures   EKG 12-Lead   No orders of the defined types were placed in this encounter.  Patient Instructions  Medication Instructions:  Your Physician recommend you continue on your current medication as directed.    *If you need a refill on your cardiac medications before your next appointment, please call your pharmacy*   Lab Work: None ordered today   Testing/Procedures: None ordered  today   Follow-Up: At [facility], you and your health needs are our priority.  As part of our continuing mission to provide you with exceptional heart care, we have created designated Provider Care Teams.  These Care Teams include your primary Cardiologist (physician) and Advanced Practice Providers (APPs -  Physician Assistants and Nurse Practitioners) who all work together to provide you with the care you need, when you need it.  We recommend signing up for the patient portal called "MyChart".  Sign up information is provided on this After Visit Summary.  MyChart is used to connect with patients for Virtual Visits (Telemedicine).  Patients are able to view lab/test results, encounter notes, upcoming appointments, etc.  Non-urgent messages can be sent to your provider as well.   To learn more about what you can do with MyChart, go to [email or web address].  Your next appointment:   6 month(s)  The format for your next appointment:   In Person  Provider:   Buford Dresser, MD        [facility],acting as a scribe for Buford Dresser, MD.,have documented all relevant documentation on the behalf of Buford Dresser, MD,as directed by  Buford Dresser, MD while in the presence of Buford Dresser, MD.  I, Buford Dresser, MD, have reviewed all documentation for this visit. The documentation on 07/18/21 for the exam, diagnosis, procedures, and orders are all accurate and complete.  Signed, Buford Dresser, MD PhD 07/18/2021 8:59 AM    [place]

## 2021-07-18 NOTE — Patient Instructions (Signed)

## 2021-07-25 ENCOUNTER — Encounter: Payer: Self-pay | Admitting: Neurology

## 2021-07-27 ENCOUNTER — Ambulatory Visit: Payer: 59 | Admitting: Physician Assistant

## 2021-07-27 ENCOUNTER — Encounter: Payer: Self-pay | Admitting: Physician Assistant

## 2021-07-27 ENCOUNTER — Other Ambulatory Visit: Payer: Self-pay

## 2021-07-27 VITALS — BP 143/83 | HR 87 | Ht 66.0 in | Wt 184.0 lb

## 2021-07-27 DIAGNOSIS — N390 Urinary tract infection, site not specified: Secondary | ICD-10-CM

## 2021-07-27 DIAGNOSIS — Z79899 Other long term (current) drug therapy: Secondary | ICD-10-CM | POA: Diagnosis not present

## 2021-07-27 DIAGNOSIS — M3500 Sicca syndrome, unspecified: Secondary | ICD-10-CM

## 2021-07-27 DIAGNOSIS — Z8719 Personal history of other diseases of the digestive system: Secondary | ICD-10-CM

## 2021-07-27 DIAGNOSIS — M797 Fibromyalgia: Secondary | ICD-10-CM

## 2021-07-27 DIAGNOSIS — F419 Anxiety disorder, unspecified: Secondary | ICD-10-CM

## 2021-07-27 DIAGNOSIS — Z8709 Personal history of other diseases of the respiratory system: Secondary | ICD-10-CM | POA: Diagnosis not present

## 2021-07-27 DIAGNOSIS — R5382 Chronic fatigue, unspecified: Secondary | ICD-10-CM

## 2021-07-27 DIAGNOSIS — M0609 Rheumatoid arthritis without rheumatoid factor, multiple sites: Secondary | ICD-10-CM | POA: Diagnosis not present

## 2021-07-27 DIAGNOSIS — R42 Dizziness and giddiness: Secondary | ICD-10-CM

## 2021-07-27 DIAGNOSIS — F5101 Primary insomnia: Secondary | ICD-10-CM

## 2021-07-27 DIAGNOSIS — F32A Depression, unspecified: Secondary | ICD-10-CM

## 2021-07-27 MED ORDER — DULOXETINE HCL 60 MG PO CPEP: ORAL_CAPSULE | ORAL | 2 refills | Status: DC

## 2021-07-29 ENCOUNTER — Other Ambulatory Visit: Payer: Self-pay

## 2021-07-29 ENCOUNTER — Ambulatory Visit: Payer: 59 | Admitting: Family Medicine

## 2021-07-29 ENCOUNTER — Encounter: Payer: Self-pay | Admitting: Family Medicine

## 2021-07-29 VITALS — BP 122/78 | HR 87 | Temp 97.4°F | Resp 18 | Ht 66.0 in | Wt 182.0 lb

## 2021-07-29 DIAGNOSIS — H8110 Benign paroxysmal vertigo, unspecified ear: Secondary | ICD-10-CM

## 2021-07-29 MED ORDER — MECLIZINE HCL 25 MG PO TABS: 25.0000 mg | ORAL_TABLET | Freq: Three times a day (TID) | ORAL | 0 refills | Status: DC | PRN

## 2021-07-29 NOTE — Progress Notes (Signed)
Subjective:    Patient ID: Courtney Myers, female    DOB: Feb 25, 1961, 61 y.o.   MRN: [id number]  HPI Patient reports feeling dizzy all week.  Symptoms started when she was in the shower.  She leaned her head back in the shower started spent.  She lost her balance and fell.  All week long, when she rolls over in bed, she feels the room spinning.  If she holds completely still the spinning..  She denies any ear pain.  She denies any headaches.  She denies any tinnitus.  She denies any sinus pain.  Recently at cardiology was she was found to be in bigeminy on EKG.  She denies feeling any palpitations since. Past Medical History:  Diagnosis Date   Anxiety    Cervical dystonia    COPD (chronic obstructive pulmonary disease) ([place]) 10/2020   Depression    Fibromyalgia    GERD (gastroesophageal reflux disease)    History of shingles    RA (rheumatoid arthritis) ([facility])    Synovitis of hand    Past Surgical History:  Procedure Laterality Date   TONSILLECTOMY     VAGINAL HYSTERECTOMY  3/08   Current Outpatient Medications on File Prior to Visit  Medication Sig Dispense Refill   aspirin EC 81 MG tablet Take 1 tablet (81 mg total) by mouth daily. Swallow whole. 90 tablet 3   baclofen (LIORESAL) 10 MG tablet Take 10 mg by mouth daily as needed for muscle spasms.      clonazePAM (KLONOPIN) 0.5 MG tablet Take 1 tablet (0.5 mg total) by mouth 2 (two) times daily as needed. #60 must last 30 days. 60 tablet 3   DULoxetine (CYMBALTA) 60 MG capsule Take 1 capsule by mouth daily 30 capsule 2   DYSPORT 500 units SOLR injection INJECT 500 UNITS  INTRAMUSCULARLY EVERY 3  MONTHS (GIVEN AT MD OFFICE, DISCARD UNUSED) 1 each 3   Multiple Vitamins-Minerals (MULTIVITAMINS THER. W/MINERALS) TABS Take 1 tablet by mouth at bedtime.      omeprazole (PRILOSEC) 20 MG capsule Take 1 capsule (20 mg total) by mouth daily. 90 capsule 1   RINVOQ 15 MG TB24 TAKE 1 TABLET BY MOUTH  DAILY 30 tablet 2   rosuvastatin  (CRESTOR) 20 MG tablet TAKE 1 TABLET(20 MG) BY MOUTH DAILY 90 tablet 3   traMADol (ULTRAM) 50 MG tablet Take 1 - 2 every 8 hours as needed for pain. 30 tablet 0   TRELEGY ELLIPTA 200-62.5-25 MCG/INH AEPB INHALE 1 INHALATION BY  MOUTH INTO THE LUNGS DAILY 180 each 3   Xylitol (XYLIMELTS MT) Use as directed 2 tablets in the mouth or throat at bedtime as needed (dry mouth).      varenicline (CHANTIX CONTINUING MONTH PAK) 1 MG tablet Take 1 tablet (1 mg total) by mouth 2 (two) times daily. (Patient not taking: Reported on 07/27/2021) 60 tablet 4   No current facility-administered medications on file prior to visit.   No Known Allergies Social History   Socioeconomic History   Marital status: Married    Spouse name: Courtney Myers   Number of children: 2   Years of education: 12   Highest education level: Not on file  Occupational History    Employer: [employer]  Tobacco Use   Smoking status: Former    Packs/day: 0.25    Years: 34.00    Pack years: 8.50    Types: Cigarettes    Start date: 1979    Quit date: 11/16/2020  Years since quitting: 0.6   Smokeless tobacco: Never   Tobacco comments:    5-6 cigs per day/11/16/20  Vaping Use   Vaping Use: Never used  Substance and Sexual Activity   Alcohol use: Yes    Alcohol/week: 0.0 standard drinks    Comment: Rare, twice per year   Drug use: Never   Sexual activity: Yes    Birth control/protection: Surgical    Comment: 1st intercourse 61 yo-Fewer than 5 partners  Other Topics Concern   Not on file  Social History Narrative   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 Right handed.    Caffeine- 4 cups daily.   Social Determinants of Health   Financial Resource Strain: Not on file  Food Insecurity: Not on file  Transportation Needs: Not on file  Physical Activity: Not on file  Stress: Not on file  Social Connections: Not on file  Intimate Partner  Violence: Not on file     Review of Systems  All other systems reviewed and are negative.     Objective:   Physical Exam Vitals reviewed.  Constitutional:      General: She is not in acute distress.    Appearance: Normal appearance. She is normal weight. She is not ill-appearing, toxic-appearing or diaphoretic.  HENT:     Right Ear: Tympanic membrane and ear canal normal.     Left Ear: Tympanic membrane and ear canal normal.     Nose: Nose normal. No congestion or rhinorrhea.     Mouth/Throat:     Mouth: Mucous membranes are moist.     Pharynx: Oropharynx is clear.  Cardiovascular:     Rate and Rhythm: Normal rate and regular rhythm.     Heart sounds: Normal heart sounds.  Pulmonary:     Effort: Pulmonary effort is normal.     Breath sounds: Normal breath sounds.  Neurological:     Mental Status: She is alert.          Assessment & Plan:  Benign paroxysmal positional vertigo, unspecified laterality I believe the patient is experiencing vertigo.  She can use meclizine 25 mg every 8 hours as needed for vertigo.  We discussed natural history and causes this.  We also discussed Epley maneuvers.  Anticipate gradual improvement over the next 7 to 10 days.  We also discussed her PVCs and if present, she is asymptomatic.

## 2021-08-01 ENCOUNTER — Encounter: Payer: Self-pay | Admitting: Family Medicine

## 2021-08-02 ENCOUNTER — Encounter (HOSPITAL_BASED_OUTPATIENT_CLINIC_OR_DEPARTMENT_OTHER): Payer: Self-pay

## 2021-08-02 ENCOUNTER — Ambulatory Visit (INDEPENDENT_AMBULATORY_CARE_PROVIDER_SITE_OTHER): Payer: 59

## 2021-08-02 DIAGNOSIS — I493 Ventricular premature depolarization: Secondary | ICD-10-CM

## 2021-08-02 NOTE — Progress Notes (Unsigned)
Enrolled patient for a 14 day Zio XT  monitor to be mailed to patients home  °

## 2021-08-02 NOTE — Telephone Encounter (Signed)
How would you like to proceed for this? Does she need an appointment or can we just have a zio mailed to her? If you would like it mailed to her I am happy to place the order!

## 2021-08-10 ENCOUNTER — Other Ambulatory Visit: Payer: Self-pay | Admitting: Family Medicine

## 2021-08-10 NOTE — Telephone Encounter (Signed)
Clonazepam refill request.  Last seen 07/29/2021, last filled 04/05/2021.

## 2021-08-13 DIAGNOSIS — I493 Ventricular premature depolarization: Secondary | ICD-10-CM | POA: Diagnosis not present

## 2021-08-21 ENCOUNTER — Other Ambulatory Visit: Payer: Self-pay | Admitting: Family Medicine

## 2021-08-21 DIAGNOSIS — K219 Gastro-esophageal reflux disease without esophagitis: Secondary | ICD-10-CM

## 2021-08-23 ENCOUNTER — Other Ambulatory Visit: Payer: Self-pay

## 2021-08-24 ENCOUNTER — Ambulatory Visit: Payer: 59 | Admitting: Neurology

## 2021-08-24 ENCOUNTER — Encounter: Payer: Self-pay | Admitting: Family Medicine

## 2021-08-24 VITALS — BP 150/67 | HR 74

## 2021-08-24 DIAGNOSIS — H8111 Benign paroxysmal vertigo, right ear: Secondary | ICD-10-CM | POA: Diagnosis not present

## 2021-08-24 DIAGNOSIS — G243 Spasmodic torticollis: Secondary | ICD-10-CM

## 2021-08-24 NOTE — Progress Notes (Signed)
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  Symptom onset was without apparent triggering event, she has gradually developed this neck pulling,  manifested primarily with right laterocollis, mild retrocolis, and left shoulder elevation since 2000. She has good relief with regular Botox injection, about 4 times a year since 2003. Last injection was February 18, 2010 by Dr. Marta Antu,  She reported great relief as usual, taking 4- 5 days to get symptom relieve, lasting about 3 months.  I began to inject her since 12.2011, every 3-4 months, dosage has been limited to 150 units of BOTOX A.  She denies neck pain, gait difficulty, but with wearing off Botox, she noticed increased head tremor, and pulling towards her right shoulder.  She developed  transient difficulty lifting her neck up when using BOTOX A 200 units previously, reponding well to decreased dose of 150 units  She was enrolled into IPSEN dysport study, first injection was in April 30th 2013, she later enrolled into open label, 2nd injection was in  May 28th 2013. She did very well.  Recent few weeks,  She had experienced flareup of her rheumatoid arthritis, has received IV infusion. Last injection was in 06/10/2012,  500 units of dysport was dissolved into 2 cc of normal saline. Right longissimus capitus 0.5 cc Right splenius capitis 0.5 cc Right levator scapular 0.5 cc Right iliocostalis 0.5 cc   Dysport research study has completed, she is now coming back for repeat injection, she noticed head bobbing over the past few weeks, she denies significant neck pain, weakness,  Her rheumatoid arthritis is under better control   Last injection was in Sep 2014, she did very well, no neck extension weakness, only rarely neck shaking  UPDATE March 8th 2016: She has lost follow-up since her last  visit December 2014, She came in for treatment her cervical dystonia, she feel the tension of her neck all the time, stiffness. Denied gait difficulty, last injection was September 2014, she received 500 units of Dysport, works well for her, she is continue on medication for anxiety, rheumatoid arthritis She also complains of nighttime snoring, frequent awakening, excessive daytime sleepiness, fatigue, today's ESS is 6, FSS was 55,  UPDATE October 13 2014:   She has missed her scheduled sleep study, continue have worsening neck posturing, posterior neck pain, excessive daytime fatigue and sleepiness  UPDATE Nov 11 2014: She came in for EMG guided Dysport injection today, last injection was December 2014, she complains of worsening head titubation, bilateral hands mild postural tremor, posterior neck pain,  Update February 17 2015 She responded very well to last EMG guided Dysport injection Nov 11 2014, no significant head titubation, no significant neck pain  Update July 22 2015: Last injection was August, now she noticed returning of neck pulling and shaking, posterior neck muscle achy pain  Update October 21 2015: Last EMG guided Dysport injection was in January, she responded very well, only recent couple weeks, she noticed returning of neck pulling, shaking again, She has worsening rheumatoid arthritis, is receiving more frequent treatment, complains fatigue, diffuse body achy pain  Update March 15 2016: She responded very well to previous Dysport injection in April, no significant side effect noticed, only recent few weeks, 5 months from previous injection, she noticed return of head shaking neck muscle tightness.  UPDATE Dec 14th 2017: She responded very well  to previous injection in Sept 2017, no significant side effect noticed.  Update September 13 2016: She did well this last injection in December  Update January 15 2017: She noticed worsening head tremor, she had excessive stress,  her sister passed away from lung cancer in April 2018,  UPDATE Apr 25 2017: She responded very well with previous injection, barely has noticeable head shaking.  Update August 01, 2017: She responded very well to previous dysport injection, only recently began to have recurrent head shaking  UPDATE December 19 2017: She responded well to previous injection  UPDATE Sept 25 2019: She responded very well to previous injection  Update February 2022 2023  She responded well to previous injection, her rheumatoid arthritis is under excellent control,  She reported sudden onset of acute vertigo on July 22, 2020 turning to the right side, nausea, unsteady gait at the beginning of the onset, over the past few weeks, her symptoms has improved, but with sudden positional change, especially turning towards the right side, it will trigger transient dizziness, she denies hearing loss.  REVIEW OF SYSTEMS: Full 14 system review of systems performed and notable only for ear pain, joint pain, joint swelling, achy muscles, muscle cramps, rash, tremors, depression, anxiety  ALLERGIES: No Known Allergies  HOME MEDICATIONS: Current Outpatient Medications  Medication Sig Dispense Refill   aspirin EC 81 MG tablet Take 1 tablet (81 mg total) by mouth daily. Swallow whole. 90 tablet 3   baclofen (LIORESAL) 10 MG tablet Take 10 mg by mouth daily as needed for muscle spasms.      clonazePAM (KLONOPIN) 0.5 MG tablet TAKE 1 TABLET(0.5 MG TOTAL) BY MOUTH TWICE DAILY AS NEEDED,#60 MUST LAST 30 DAYS 60 tablet 2   DULoxetine (CYMBALTA) 60 MG capsule Take 1 capsule by mouth daily 30 capsule 2   DYSPORT 500 units SOLR injection INJECT 500 UNITS  INTRAMUSCULARLY EVERY 3  MONTHS (GIVEN AT MD OFFICE, DISCARD UNUSED) 1 each 3   leflunomide (ARAVA) 10 MG tablet Take 10 mg by mouth daily.     meclizine (ANTIVERT) 25 MG tablet Take 1 tablet (25 mg total) by mouth 3 (three) times daily as needed for dizziness. 30 tablet 0    Multiple Vitamins-Minerals (MULTIVITAMINS THER. W/MINERALS) TABS Take 1 tablet by mouth at bedtime.      omeprazole (PRILOSEC) 20 MG capsule TAKE 1 CAPSULE BY MOUTH  DAILY 90 capsule 3   RINVOQ 15 MG TB24 TAKE 1 TABLET BY MOUTH  DAILY 30 tablet 2   rosuvastatin (CRESTOR) 20 MG tablet TAKE 1 TABLET(20 MG) BY MOUTH DAILY 90 tablet 3   traMADol (ULTRAM) 50 MG tablet Take 1 - 2 every 8 hours as needed for pain. 30 tablet 0   TRELEGY ELLIPTA 200-62.5-25 MCG/INH AEPB INHALE 1 INHALATION BY  MOUTH INTO THE LUNGS DAILY 180 each 3   varenicline (CHANTIX CONTINUING MONTH PAK) 1 MG tablet Take 1 tablet (1 mg total) by mouth 2 (two) times daily. (Patient not taking: Reported on 07/27/2021) 60 tablet 4   Xylitol (XYLIMELTS MT) Use as directed 2 tablets in the mouth or throat at bedtime as needed (dry mouth).      No current facility-administered medications for this visit.    PAST MEDICAL HISTORY: Past Medical History:  Diagnosis Date   Anxiety    Cervical dystonia    COPD (chronic obstructive pulmonary disease) ([place]) 10/2020   Depression    Fibromyalgia    GERD (gastroesophageal reflux disease)    History  of shingles    RA (rheumatoid arthritis) ([facility])    Synovitis of hand     PAST SURGICAL HISTORY: Past Surgical History:  Procedure Laterality Date   TONSILLECTOMY     VAGINAL HYSTERECTOMY  3/08    FAMILY HISTORY: Family History  Problem Relation Age of Onset   Arthritis Mother    Heart Problems Father    Heart disease Father    Heart disease Paternal Uncle        MI    SOCIAL HISTORY:  Social History   Socioeconomic History   Marital status: Married    Spouse name: [name]   Number of children: 2   Years of education: 12   Highest education level: Not on file  Occupational History    Employer: [employer]  Tobacco Use   Smoking status: Former    Packs/day: 0.25    Years: 34.00    Pack years: 8.50    Types: Cigarettes    Start date: 1979    Quit date: 11/16/2020     Years since quitting: 0.7   Smokeless tobacco: Never   Tobacco comments:    5-6 cigs per day/11/16/20  Vaping Use   Vaping Use: Never used  Substance and Sexual Activity   Alcohol use: Yes    Alcohol/week: 0.0 standard drinks    Comment: Rare, twice per year   Drug use: Never   Sexual activity: Yes    Birth control/protection: Surgical    Comment: 1st intercourse 61 yo-Fewer than 5 partners  Other Topics Concern   Not on file  Social History Narrative   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 Right handed.    Caffeine- 4 cups daily.   Social Determinants of Health   Financial Resource Strain: Not on file  Food Insecurity: Not on file  Transportation Needs: Not on file  Physical Activity: Not on file  Stress: Not on file  Social Connections: Not on file  Intimate Partner Violence: Not on file     PHYSICAL EXAM   Vitals:   08/24/21 1552  BP: (!) 150/67  Pulse: 74    Gen: NAD, conversant, well nourised, well groomed             NEUROLOGICAL EXAM:  MENTAL STATUS: Speech/cognition: Awake, alert, oriented to history taking and casual conversation   CRANIAL NERVES: She has mild retrocollis, right tilt, slight left shoulder elevation CN II: Visual fields are full to confrontation.  Pupils are round equal and briskly reactive to light. CN III, IV, VI: extraocular movement are normal. No ptosis. CN V: Facial sensation is intact to pinprick in all 3 divisions bilaterally. Corneal responses are intact.  CN VII: Face is symmetric with normal eye closure and smile. CN VIII: Hearing is normal to casual conversation CN IX, X: Palate elevates symmetrically. Phonation is normal. CN XI: Head turning and shoulder shrug are intact  MOTOR: There is no pronator drift of out-stretched arms. Muscle bulk and tone are normal. Muscle strength is normal.  COORDINATION: Rapid alternating movements and fine  finger movements are intact. There is no dysmetria on finger-to-nose and heel-knee-shin.    GAIT/STANCE: Posture is normal. Gait is steady    I performed Epley's maneuver, right ear dependent position after short latency will trigger rotatory downward beating nystagmus, habituate quickly, on the second repeat Epley's maneuver, the nystagmus and her vertigo sensation has much improved  DIAGNOSTIC DATA (LABS, IMAGING,  TESTING) - I reviewed patient records, labs, notes, testing and imaging myself where available.  ASSESSMENT AND PLAN  Courtney Myers is a 61 y.o. female with long-standing history of cervical dystonia, responded very well to previous EMG guided Dysport injection, last injection was May 2016, responded very well.   Mild retrocollis, right tilt, slight left shoulder elevation  500 of Dysport was dissolved into 2.5 cc of normal saline,  Right longissimus capitus 0. 5 cc Left splenius capitis 0.5 cc Right inferior oblique capitis, 0.5 cc  Left inferior oblique capitis 0.5 cc Left levator scapular 0.5   Benign positional vertigo  Symptom onset since July 22, 2021,  I performed Epley maneuver, right ear dependent position will trigger downward beating rotatory nystagmus, quickly improved after 2 repeat procedures   Courtney Myers, M.D. Ph.D.  [facility] [address] [place], [place] [address] Ph: [phone] Fax: [phone]      PATIENT: Courtney Myers DOB: [birth year]  HISTORICAL  Courtney Myers  is a 61 year-old right-handed Caucasian female, came in for EMG guided Botox for cervical dystonia.  Symptom onset was without apparent triggering event, she has gradually developed this neck pulling,  manifested primarily with right laterocollis, mild retrocolis, and left shoulder elevation since 2000. She has good relief with regular Botox injection, about 4 times a year since 2003. Last injection was February 18, 2010 by Dr. Marta Antu,  She reported great relief as usual, taking 4- 5 days to get symptom relieve, lasting about 3 months.  I began to inject her since 12.2011, every 3-4 months, dosage has been limited to 150 units of BOTOX A.  She denies neck pain, gait difficulty, but with wearing off Botox, she noticed increased head tremor, and pulling towards her right shoulder.  She developed  transient difficulty lifting her neck up when using BOTOX A 200 units previously, reponding well to decreased dose of 150 units  She was enrolled into IPSEN dysport study, first injection was in April 30th 2013, she later enrolled into open label, 2nd injection was in  May 28th 2013. She did very well.  Recent few weeks,  She had experienced flareup of her rheumatoid arthritis, has received IV infusion. Last injection was in 06/10/2012,  500 units of dysport was dissolved into 2 cc of normal saline. Right longissimus capitus 0.5 cc Right splenius capitis 0.5 cc Right levator scapular 0.5 cc Right iliocostalis 0.5 cc   Dysport research study has completed, she is now coming back for repeat injection, she noticed head bobbing over the past few weeks, she denies significant neck pain, weakness,  Her rheumatoid arthritis is under better control   Last injection was in Sep 2014, she did very well, no neck extension weakness, only rarely neck shaking  UPDATE March 8th 2016: She has lost follow-up since her last visit December 2014, She came in for treatment her cervical dystonia, she feel the tension of her neck all the time, stiffness. Denied gait difficulty, last injection was September 2014, she received 500 units of Dysport, works well for her, she is continue on medication for anxiety, rheumatoid arthritis She also complains of nighttime snoring, frequent awakening, excessive daytime sleepiness, fatigue, today's ESS is 6, FSS was 28,  UPDATE October 13 2014:   She has missed her scheduled sleep study, continue  have worsening neck posturing, posterior neck pain, excessive daytime fatigue and sleepiness  UPDATE Nov 11 2014: She came in for EMG guided Dysport injection  today, last injection was December 2014, she complains of worsening head titubation, bilateral hands mild postural tremor, posterior neck pain,  Update February 17 2015 She responded very well to last EMG guided Dysport injection Nov 11 2014, no significant head titubation, no significant neck pain  Update July 22 2015: Last injection was August, now she noticed returning of neck pulling and shaking, posterior neck muscle achy pain  Update October 21 2015: Last EMG guided Dysport injection was in January, she responded very well, only recent couple weeks, she noticed returning of neck pulling, shaking again, She has worsening rheumatoid arthritis, is receiving more frequent treatment, complains fatigue, diffuse body achy pain  Update March 15 2016: She responded very well to previous Dysport injection in April, no significant side effect noticed, only recent few weeks, 5 months from previous injection, she noticed return of head shaking neck muscle tightness.  UPDATE Dec 14th 2017: She responded very well to previous injection in Sept 2017, no significant side effect noticed.  Update September 13 2016: She did well this last injection in December  Update January 15 2017: She noticed worsening head tremor, she had excessive stress, her sister passed away from lung cancer in April 2018,  UPDATE Apr 25 2017: She responded very well with previous injection, barely has noticeable head shaking.  Update August 01, 2017: She responded very well to previous dysport injection, only recently began to have recurrent head shaking  UPDATE December 19 2017: She responded well to previous injection  UPDATE Jun 20 2018: She did very well with previous injection  UPDATE October 01 2018: She did well to previous injection  UPDATE January 01 2019: She did well with previous injection  UPDATE Oct 28th 2020: She did well to previous injections  UPDATE Jan 27th 2021: She did well to previous injections  Update October 29, 2019: She did well with previous injection, no significant side effect noted.  Update February 04, 2020, Uses Dysport 500 units, responding well  UPDATE Jun 02 2020: Uses Dysport 500 units, responding well  Update September 28, 2020: She responded well to previous injection, unfortunately she has been a longtime smoker, diagnosed with COPD, chest x-ray showed bibasilar atelectasis, and infiltration, small left pleural effusion on September 08, 2020   REVIEW OF SYSTEMS: Full 14 system review of systems performed and notable only for as above.  All rest review of system were negative.  ALLERGIES: No Known Allergies  HOME MEDICATIONS: Current Outpatient Medications  Medication Sig Dispense Refill   aspirin EC 81 MG tablet Take 1 tablet (81 mg total) by mouth daily. Swallow whole. 90 tablet 3   baclofen (LIORESAL) 10 MG tablet Take 10 mg by mouth daily as needed for muscle spasms.      clonazePAM (KLONOPIN) 0.5 MG tablet TAKE 1 TABLET(0.5 MG TOTAL) BY MOUTH TWICE DAILY AS NEEDED,#60 MUST LAST 30 DAYS 60 tablet 2   DULoxetine (CYMBALTA) 60 MG capsule Take 1 capsule by mouth daily 30 capsule 2   DYSPORT 500 units SOLR injection INJECT 500 UNITS  INTRAMUSCULARLY EVERY 3  MONTHS (GIVEN AT MD OFFICE, DISCARD UNUSED) 1 each 3   leflunomide (ARAVA) 10 MG tablet Take 10 mg by mouth daily.     meclizine (ANTIVERT) 25 MG tablet Take 1 tablet (25 mg total) by mouth 3 (three) times daily as needed for dizziness. 30 tablet 0   Multiple Vitamins-Minerals (MULTIVITAMINS THER. W/MINERALS) TABS Take 1 tablet by mouth at bedtime.  omeprazole (PRILOSEC) 20 MG capsule TAKE 1 CAPSULE BY MOUTH  DAILY 90 capsule 3   RINVOQ 15 MG TB24 TAKE 1 TABLET BY MOUTH  DAILY 30 tablet 2   rosuvastatin (CRESTOR) 20 MG tablet TAKE 1 TABLET(20 MG)  BY MOUTH DAILY 90 tablet 3   traMADol (ULTRAM) 50 MG tablet Take 1 - 2 every 8 hours as needed for pain. 30 tablet 0   TRELEGY ELLIPTA 200-62.5-25 MCG/INH AEPB INHALE 1 INHALATION BY  MOUTH INTO THE LUNGS DAILY 180 each 3   varenicline (CHANTIX CONTINUING MONTH PAK) 1 MG tablet Take 1 tablet (1 mg total) by mouth 2 (two) times daily. (Patient not taking: Reported on 07/27/2021) 60 tablet 4   Xylitol (XYLIMELTS MT) Use as directed 2 tablets in the mouth or throat at bedtime as needed (dry mouth).      No current facility-administered medications for this visit.    PAST MEDICAL HISTORY: Past Medical History:  Diagnosis Date   Anxiety    Cervical dystonia    COPD (chronic obstructive pulmonary disease) ([place]) 10/2020   Depression    Fibromyalgia    GERD (gastroesophageal reflux disease)    History of shingles    RA (rheumatoid arthritis) ([facility])    Synovitis of hand     PAST SURGICAL HISTORY: Past Surgical History:  Procedure Laterality Date   TONSILLECTOMY     VAGINAL HYSTERECTOMY  3/08    FAMILY HISTORY: Family History  Problem Relation Age of Onset   Arthritis Mother    Heart Problems Father    Heart disease Father    Heart disease Paternal Uncle        MI    SOCIAL HISTORY:  Social History   Socioeconomic History   Marital status: Married    Spouse name: [name]   Number of children: 2   Years of education: 12   Highest education level: Not on file  Occupational History    Employer: [employer]  Tobacco Use   Smoking status: Former    Packs/day: 0.25    Years: 34.00    Pack years: 8.50    Types: Cigarettes    Start date: 1979    Quit date: 11/16/2020    Years since quitting: 0.7   Smokeless tobacco: Never   Tobacco comments:    5-6 cigs per day/11/16/20  Vaping Use   Vaping Use: Never used  Substance and Sexual Activity   Alcohol use: Yes    Alcohol/week: 0.0 standard drinks    Comment: Rare, twice per year   Drug use: Never   Sexual activity: Yes     Birth control/protection: Surgical    Comment: 1st intercourse 61 yo-Fewer than 5 partners  Other Topics Concern   Not on file  Social History Narrative   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 Right handed.    Caffeine- 4 cups daily.   Social Determinants of Health   Financial Resource Strain: Not on file  Food Insecurity: Not on file  Transportation Needs: Not on file  Physical Activity: Not on file  Stress: Not on file  Social Connections: Not on file  Intimate Partner Violence: Not on file     PHYSICAL EXAM   Vitals:   08/24/21 1552  BP: (!) 150/67  Pulse: 74   Not recorded     There is no height or weight on file to calculate BMI.  PHYSICAL EXAMNIATION:  Mild retrocollis, right tilt, mild right lateral shift, she has moderate tenderness at bilateral levator scapular muscle   ASSESSMENT AND PLAN  Akirra Lacerda is a 61 y.o. female with long-standing history of cervical dystonia, responded very well to previous EMG guided Dysport injection, l  Mild retrocollis, right tilt, mild left turn slight left shoulder elevation, frequent no-no titubation  500 of Dysport was dissolved into 2.5 cc of normal saline,  Right longissimus capitus 0. 5cc Right splenius capitis 0.25 c Right inferior oblique capitis 0.5 cc Left splenius cervix 0.5 Left splenius capitis 0.25 Right splenius cervix 0.25 cc Left levator scapular 0. 25 cc   Patient tolerate the injection well, will return to clinic in 3 months for repeat injection Please dissolve 500 units of Dysport into 2.5 cc of normal saline  Benign positional vertigo, right ear dependent firs.t Courtney Myers, M.D. Ph.D.  [facility] [address], [place], [place] [address] Ph: [phone] Fax: [phone]

## 2021-08-24 NOTE — Progress Notes (Signed)
Dysport 500 u x 1 vial [id number] Exp-06/01/2022 sp

## 2021-08-25 DIAGNOSIS — H8111 Benign paroxysmal vertigo, right ear: Secondary | ICD-10-CM | POA: Insufficient documentation

## 2021-09-13 ENCOUNTER — Encounter (HOSPITAL_BASED_OUTPATIENT_CLINIC_OR_DEPARTMENT_OTHER): Payer: Self-pay

## 2021-09-13 ENCOUNTER — Other Ambulatory Visit: Payer: Self-pay | Admitting: Physician Assistant

## 2021-09-13 NOTE — Telephone Encounter (Signed)
Per office note on 07/27/2021:  Currently holding arava due to elevated creatinine on 07/07/2021.   ?

## 2021-09-21 ENCOUNTER — Encounter: Payer: Self-pay | Admitting: Family Medicine

## 2021-10-03 ENCOUNTER — Encounter: Payer: Self-pay | Admitting: Family Medicine

## 2021-10-03 ENCOUNTER — Ambulatory Visit (INDEPENDENT_AMBULATORY_CARE_PROVIDER_SITE_OTHER): Payer: 59 | Admitting: Family Medicine

## 2021-10-03 VITALS — BP 130/84 | HR 96 | Temp 97.1°F | Ht 72.0 in | Wt 191.2 lb

## 2021-10-03 DIAGNOSIS — Z23 Encounter for immunization: Secondary | ICD-10-CM | POA: Diagnosis not present

## 2021-10-03 DIAGNOSIS — Z1211 Encounter for screening for malignant neoplasm of colon: Secondary | ICD-10-CM | POA: Diagnosis not present

## 2021-10-03 DIAGNOSIS — Z780 Asymptomatic menopausal state: Secondary | ICD-10-CM

## 2021-10-03 DIAGNOSIS — Z1231 Encounter for screening mammogram for malignant neoplasm of breast: Secondary | ICD-10-CM | POA: Diagnosis not present

## 2021-10-03 DIAGNOSIS — Z0000 Encounter for general adult medical examination without abnormal findings: Secondary | ICD-10-CM

## 2021-10-03 DIAGNOSIS — Z13220 Encounter for screening for lipoid disorders: Secondary | ICD-10-CM

## 2021-10-03 DIAGNOSIS — Z122 Encounter for screening for malignant neoplasm of respiratory organs: Secondary | ICD-10-CM

## 2021-10-03 NOTE — Progress Notes (Signed)
? ?Subjective:  ? ? Patient ID: Courtney Myers, female    DOB: 11-Feb-1961, 61 y.o.   MRN: [id number] ? ?HPI  ? ?Patient is a very pleasant 61 year old Caucasian female who is here today for complete physical exam.  Her last mammogram was 2017.  This is overdue.  She has a history of a hysterectomy and therefore she does not require Pap smear.  Her last colonoscopy was more than 10 years ago and this is up-to-date.  Her last bone density test was more than 5 years ago and this is overdue..  She is also due for lung cancer screening.  I reviewed her immunizations.  She is due for Prevnar 20.  The remainder of her immunizations are up-to-date.  She just had a CMP and a CBC done about 2 months ago that showed stable chronic kidney disease but she is due to recheck her cholesterol.  I congratulated the patient that she has successfully quit smoking but she does have more than 20-pack-year history of smoking so she is due for lung cancer screening. ?Past Medical History:  ?Diagnosis Date  ? Anxiety   ? Cervical dystonia   ? COPD (chronic obstructive pulmonary disease) ([place]) 10/2020  ? Depression   ? Fibromyalgia   ? GERD (gastroesophageal reflux disease)   ? History of shingles   ? RA (rheumatoid arthritis) ([place])   ? Synovitis of hand   ? ?Past Surgical History:  ?Procedure Laterality Date  ? TONSILLECTOMY    ? VAGINAL HYSTERECTOMY  3/08  ? ?Current Outpatient Medications on File Prior to Visit  ?Medication Sig Dispense Refill  ? aspirin EC 81 MG tablet Take 1 tablet (81 mg total) by mouth daily. Swallow whole. 90 tablet 3  ? baclofen (LIORESAL) 10 MG tablet Take 10 mg by mouth daily as needed for muscle spasms.     ? clonazePAM (KLONOPIN) 0.5 MG tablet TAKE 1 TABLET(0.5 MG TOTAL) BY MOUTH TWICE DAILY AS NEEDED,#60 MUST LAST 30 DAYS 60 tablet 2  ? DULoxetine (CYMBALTA) 60 MG capsule Take 1 capsule by mouth daily 30 capsule 2  ? DYSPORT 500 units SOLR injection INJECT 500 UNITS  INTRAMUSCULARLY EVERY 3  MONTHS  (GIVEN AT MD OFFICE, DISCARD UNUSED) 1 each 3  ? leflunomide (ARAVA) 10 MG tablet Take 10 mg by mouth daily.    ? meclizine (ANTIVERT) 25 MG tablet Take 1 tablet (25 mg total) by mouth 3 (three) times daily as needed for dizziness. 30 tablet 0  ? Multiple Vitamins-Minerals (MULTIVITAMINS THER. W/MINERALS) TABS Take 1 tablet by mouth at bedtime.     ? omeprazole (PRILOSEC) 20 MG capsule TAKE 1 CAPSULE BY MOUTH  DAILY 90 capsule 3  ? RINVOQ 15 MG TB24 TAKE 1 TABLET BY MOUTH  DAILY 30 tablet 2  ? rosuvastatin (CRESTOR) 20 MG tablet TAKE 1 TABLET(20 MG) BY MOUTH DAILY 90 tablet 3  ? traMADol (ULTRAM) 50 MG tablet Take 1 - 2 every 8 hours as needed for pain. 30 tablet 0  ? TRELEGY ELLIPTA 200-62.5-25 MCG/INH AEPB INHALE 1 INHALATION BY  MOUTH INTO THE LUNGS DAILY 180 each 3  ? Xylitol (XYLIMELTS MT) Use as directed 2 tablets in the mouth or throat at bedtime as needed (dry mouth).     ? ?No current facility-administered medications on file prior to visit.  ? ?No Known Allergies ?Social History  ? ?Socioeconomic History  ? Marital status: Married  ?  Spouse name: [name]  ? Number of children: 2  ?  Years of education: 33  ? Highest education level: Not on file  ?Occupational History  ?  Employer: [employer]  ?Tobacco Use  ? Smoking status: Former  ?  Packs/day: 0.25  ?  Years: 34.00  ?  Pack years: 8.50  ?  Types: Cigarettes  ?  Start date: 76  ?  Quit date: 11/16/2020  ?  Years since quitting: 0.8  ? Smokeless tobacco: Never  ? Tobacco comments:  ?  5-6 cigs per day/11/16/20  ?Vaping Use  ? Vaping Use: Never used  ?Substance and Sexual Activity  ? Alcohol use: Yes  ?  Alcohol/week: 0.0 standard drinks  ?  Comment: Rare, twice per year  ? Drug use: Never  ? Sexual activity: Yes  ?  Birth control/protection: Surgical  ?  Comment: 1st intercourse 61 yo-Fewer than 5 partners  ?Other Topics Concern  ? Not on file  ?Social History Narrative  ? 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 Right handed.   ? Caffeine- 4 cups daily.  ? ?Social Determinants of Health  ? ?Financial Resource Strain: Not on file  ?Food Insecurity: Not on file  ?Transportation Needs: Not on file  ?Physical Activity: Not on file  ?Stress: Not on file  ?Social Connections: Not on file  ?Intimate Partner Violence: Not on file  ? ? ?The patient smokes.  She has a history of coronary artery disease.  Her father had numerous stents and bypasses starting in his 32s.  She had a paternal uncle who had stents in his 11s.  Her paternal grandfather also had stents and bypasses.  Therefore coronary artery disease is strong and the female members on her father side of the family usually in the late 30s or early 53s.  ? ?Review of Systems  ?All other systems reviewed and are negative. ? ?   ?Objective:  ? Physical Exam ?Vitals reviewed.  ?Constitutional:   ?   General: She is not in acute distress. ?   Appearance: Normal appearance. She is obese. She is not ill-appearing or toxic-appearing.  ?HENT:  ?   Head: Normocephalic and atraumatic.  ?   Right Ear: Tympanic membrane and ear canal normal.  ?   Left Ear: Tympanic membrane and ear canal normal.  ?   Nose: Nose normal. No congestion or rhinorrhea.  ?   Mouth/Throat:  ?   Mouth: Mucous membranes are moist.  ?   Pharynx: Oropharynx is clear. No oropharyngeal exudate or posterior oropharyngeal erythema.  ?Eyes:  ?   Extraocular Movements: Extraocular movements intact.  ?   Conjunctiva/sclera: Conjunctivae normal.  ?   Pupils: Pupils are equal, round, and reactive to light.  ?Neck:  ?   Vascular: No carotid bruit.  ?Cardiovascular:  ?   Rate and Rhythm: Normal rate and regular rhythm.  ?   Pulses: Normal pulses.  ?   Heart sounds: Normal heart sounds. No murmur heard. ?  No friction rub. No gallop.  ?Pulmonary:  ?   Effort: Pulmonary effort is normal. No respiratory distress.  ?   Breath sounds: Normal breath sounds. No stridor. No wheezing, rhonchi or  rales.  ?Chest:  ?   Chest wall: No tenderness or crepitus.  ?Abdominal:  ?   General: Abdomen is flat. Bowel sounds are normal. There is no distension.  ?   Palpations: Abdomen is soft.  ?   Tenderness: There is no abdominal tenderness.  There is no guarding or rebound.  ?   Hernia: No hernia is present.  ?Musculoskeletal:     ?   General: No swelling, tenderness, deformity or signs of injury.  ?   Cervical back: Normal range of motion and neck supple. No rigidity.  ?   Right lower leg: No edema.  ?   Left lower leg: No edema.  ?Lymphadenopathy:  ?   Cervical: No cervical adenopathy.  ?Skin: ?   General: Skin is warm.  ?   Coloration: Skin is not jaundiced.  ?   Findings: No bruising, erythema or lesion.  ?Neurological:  ?   General: No focal deficit present.  ?   Mental Status: She is alert and oriented to person, place, and time. Mental status is at baseline.  ?   Cranial Nerves: No cranial nerve deficit.  ?   Sensory: No sensory deficit.  ?   Motor: No weakness.  ?   Coordination: Coordination normal.  ?   Gait: Gait normal.  ?   Deep Tendon Reflexes: Reflexes normal.  ?Psychiatric:     ?   Mood and Affect: Mood normal.     ?   Behavior: Behavior normal.     ?   Thought Content: Thought content normal.     ?   Judgment: Judgment normal.  ? ? ? ? ? ?   ?Assessment & Plan:  ?Encounter for screening mammogram for malignant neoplasm of breast - Plan: MM Digital Screening ? ?Colon cancer screening - Plan: Ambulatory referral to Gastroenterology ? ?Postmenopausal estrogen deficiency - Plan: DG Bone Density ? ?General medical exam ? ?Screening for lung cancer - Plan: CT CHEST LUNG CA SCREEN LOW DOSE W/O CM ? ?Screening cholesterol level - Plan: Lipid panel ?Physical exam today is completely normal.  I will schedule the patient for a CT scan of the lungs to screen for lung cancer.  I will schedule her for mammogram screen for breast cancer.  She does not require a Pap smear due to her history of hysterectomy.  She is  overdue for colonoscopy so I will schedule this.  I will also schedule her for a bone density test.  I will check her cholesterol today.  She received Prevnar 20 today. ?

## 2021-10-04 LAB — LIPID PANEL
Cholesterol: 132 mg/dL (ref <200)
HDL: 71 mg/dL (ref >=50)
LDL Cholesterol (Calc): 47 mg/dL (calc)
Non-HDL Cholesterol (Calc): 61 mg/dL (calc) (ref <130)
Total CHOL/HDL Ratio: 1.9 (calc) (ref <5.0)
Triglycerides: 68 mg/dL (ref <150)

## 2021-10-04 NOTE — Addendum Note (Signed)
Addended by: Colman Cater on: 10/04/2021 08:39 AM ? ? Modules accepted: Orders ? ?

## 2021-10-05 ENCOUNTER — Encounter: Payer: Self-pay | Admitting: Family Medicine

## 2021-10-06 ENCOUNTER — Other Ambulatory Visit: Payer: Self-pay | Admitting: Family Medicine

## 2021-10-06 MED ORDER — SCOPOLAMINE 1 MG/3DAYS TD PT72: 1.0000 | MEDICATED_PATCH | TRANSDERMAL | 0 refills | Status: DC

## 2021-10-07 ENCOUNTER — Other Ambulatory Visit: Payer: Self-pay | Admitting: Physician Assistant

## 2021-10-10 NOTE — Telephone Encounter (Signed)
Next Visit: 12/29/2021 ? ?Last Visit: 07/27/2021 ? ?Last Fill: 06/22/2021 ? ?DX: Rheumatoid arthritis of multiple sites with negative rheumatoid factor  ? ?Current Dose per office note on 07/27/2021: Rinvoq 15 mg 1 tablet by mouth daily  ? ?Labs: 07/07/2021 Creatinine is better but is still elevated.  Patient was clinically doing well without any joint inflammation or swelling.  She may stop leflunomide.  We will recheck labs in 2 months.  Please encourage increased water intake. ? ?TB Gold: 02/23/2021 negative  ? ?Advised patient she is due to update labs, patient verbalized understanding and will update labs this week. Lab orders are in place.  ? ?Okay to refill rinvoq?  ?

## 2021-10-13 ENCOUNTER — Encounter: Payer: Self-pay | Admitting: Gastroenterology

## 2021-10-27 ENCOUNTER — Telehealth: Payer: Self-pay | Admitting: Neurology

## 2021-10-27 MED ORDER — DYSPORT 500 UNITS IM SOLR: INTRAMUSCULAR | 3 refills | Status: AC

## 2021-10-27 NOTE — Addendum Note (Signed)
Addended by: Verlin Grills on: 10/27/2021 04:04 PM ? ? Modules accepted: Orders ? ?

## 2021-10-27 NOTE — Telephone Encounter (Addendum)
Please send Dysport Rx to [facility].  

## 2021-10-27 NOTE — Telephone Encounter (Signed)
Refill has been sent.  °

## 2021-10-28 ENCOUNTER — Other Ambulatory Visit: Payer: Self-pay | Admitting: *Deleted

## 2021-10-28 DIAGNOSIS — Z79899 Other long term (current) drug therapy: Secondary | ICD-10-CM

## 2021-10-29 LAB — CBC WITH DIFFERENTIAL/PLATELET
Absolute Monocytes: 853 cells/uL (ref 200–950)
Basophils Absolute: 98 cells/uL (ref 0–200)
Basophils Relative: 1.2 %
Eosinophils Absolute: 98 cells/uL (ref 15–500)
Eosinophils Relative: 1.2 %
HCT: 36.5 % (ref 35.0–45.0)
Hemoglobin: 12.1 g/dL (ref 11.7–15.5)
Lymphs Abs: 2247 cells/uL (ref 850–3900)
MCH: 29.4 pg (ref 27.0–33.0)
MCHC: 33.2 g/dL (ref 32.0–36.0)
MCV: 88.6 fL (ref 80.0–100.0)
MPV: 12.3 fL (ref 7.5–12.5)
Monocytes Relative: 10.4 %
Neutro Abs: 4904 cells/uL (ref 1500–7800)
Neutrophils Relative %: 59.8 %
Platelets: 288 10*3/uL (ref 140–400)
RBC: 4.12 10*6/uL (ref 3.80–5.10)
RDW: 12.4 % (ref 11.0–15.0)
Total Lymphocyte: 27.4 %
WBC: 8.2 10*3/uL (ref 3.8–10.8)

## 2021-10-29 LAB — COMPLETE METABOLIC PANEL WITH GFR
AG Ratio: 1.3 (calc) (ref 1.0–2.5)
ALT: 17 U/L (ref 6–29)
AST: 18 U/L (ref 10–35)
Albumin: 3.9 g/dL (ref 3.6–5.1)
Alkaline phosphatase (APISO): 96 U/L (ref 37–153)
BUN/Creatinine Ratio: 20 (calc) (ref 6–22)
BUN: 22 mg/dL (ref 7–25)
CO2: 28 mmol/L (ref 20–32)
Calcium: 9.5 mg/dL (ref 8.6–10.4)
Chloride: 102 mmol/L (ref 98–110)
Creat: 1.08 mg/dL — ABNORMAL HIGH (ref 0.50–1.05)
Globulin: 3 g/dL (calc) (ref 1.9–3.7)
Glucose, Bld: 81 mg/dL (ref 65–99)
Potassium: 4.2 mmol/L (ref 3.5–5.3)
Sodium: 139 mmol/L (ref 135–146)
Total Bilirubin: 0.3 mg/dL (ref 0.2–1.2)
Total Protein: 6.9 g/dL (ref 6.1–8.1)
eGFR: 58 mL/min/{1.73_m2} — ABNORMAL LOW (ref >=60)

## 2021-10-31 NOTE — Progress Notes (Signed)
CBC WNL.  Creatinine is borderline elevated-1.08-trending down.  GFR is borderline elevated but improving-58. Rest of CMP WNL.

## 2021-11-06 ENCOUNTER — Other Ambulatory Visit: Payer: Self-pay | Admitting: Physician Assistant

## 2021-11-07 ENCOUNTER — Other Ambulatory Visit: Payer: 59

## 2021-11-07 NOTE — Telephone Encounter (Signed)
Next Visit: 12/29/2021 ?  ?Last Visit: 07/27/2021 ?  ?Last Fill: 07/27/2021 ? ?DX: Fibromyalgia  ? ?Current Dose per office note on 07/27/2021:  Cymbalta 60 mg 1 capsule by mouth daily ? ?Okay to refill Cymbalta?  ?

## 2021-11-10 ENCOUNTER — Other Ambulatory Visit: Payer: Self-pay | Admitting: Family Medicine

## 2021-11-10 NOTE — Telephone Encounter (Signed)
LOV 10/03/21 ?Last refill 08/11/21, #60, 2 refills ? ?Please review, thanks! ? ?

## 2021-11-10 NOTE — Telephone Encounter (Signed)
Requested medication (s) are due for refill today: yes ? ?Requested medication (s) are on the active medication list: yes   ? ?Last refill: 08/11/21  #60  2 refills ? ?Future visit scheduled no ? ?Notes to clinic:Not delegated, please review. Thank you. ? ?Requested Prescriptions  ?Pending Prescriptions Disp Refills  ? clonazePAM (KLONOPIN) 0.5 MG tablet [Pharmacy Med Name: CLONAZEPAM 0.'5MG'$  TABLETS] 60 tablet   ?  Sig: TAKE 1 TABLET BY MOUTH TWICE DAILY AS NEEDED FOR 30 DAYS  ?  ? Not Delegated - Psychiatry: Anxiolytics/Hypnotics 2 Failed - 11/10/2021  7:18 AM  ?  ?  Failed - This refill cannot be delegated  ?  ?  Failed - Urine Drug Screen completed in last 360 days  ?  ?  Passed - Patient is not pregnant  ?  ?  Passed - Valid encounter within last 6 months  ?  Recent Outpatient Visits   ? ?      ? 1 month ago Encounter for screening mammogram for malignant neoplasm of breast  ? [facility] Susy Frizzle, MD  ? 3 months ago Benign paroxysmal positional vertigo, unspecified laterality  ? [facility] Pickard, Cammie Mcgee, MD  ? 7 months ago Encounter for tobacco use cessation counseling  ? [facility] Pickard, Cammie Mcgee, MD  ? 1 year ago Rheumatoid arthritis involving multiple sites, unspecified whether rheumatoid factor present [facility])  ? [facility] Pickard, Cammie Mcgee, MD  ? 1 year ago Shortness of breath  ? [facility] Pickard, Cammie Mcgee, MD  ? ?  ?  ?Future Appointments   ? ?        ? In 1 month Bo Merino, MD [facility]  ? ?  ? ? ?  ?  ?  ? ? ? ? ?

## 2021-11-15 ENCOUNTER — Encounter: Payer: Self-pay | Admitting: *Deleted

## 2021-11-22 ENCOUNTER — Ambulatory Visit (AMBULATORY_SURGERY_CENTER): Payer: 59 | Admitting: *Deleted

## 2021-11-22 VITALS — Ht 66.0 in | Wt 180.0 lb

## 2021-11-22 DIAGNOSIS — Z1211 Encounter for screening for malignant neoplasm of colon: Secondary | ICD-10-CM

## 2021-11-22 MED ORDER — NA SULFATE-K SULFATE-MG SULF 17.5-3.13-1.6 GM/177ML PO SOLN: 1.0000 | Freq: Once | ORAL | 0 refills | Status: AC

## 2021-11-22 NOTE — Progress Notes (Signed)
No egg or soy allergy known to patient  No issues known to pt with past sedation with any surgeries or procedures Patient denies ever being told they had issues or difficulty with intubation  No FH of Malignant Hyperthermia Pt is not on diet pills Pt is not on  home 02  Pt is not on blood thinners  Pt denies issues with constipation  No A fib or A flutter   NO PA's for preps discussed with pt In PV today  Discussed with pt there will be an out-of-pocket cost for prep and that varies from $0 to 70 +  dollars - pt verbalized understanding  Pt instructed to use Singlecare.com or GoodRx for a price reduction on prep   PV completed over the phone. Pt verified name, DOB, address and insurance during PV today.   Pt mailed instruction packet with copy of consent form to read and not return, and instructions.   Pt encouraged to call with questions or issues.  If pt has My chart, procedure instructions sent via My Chart  Insurance confirmed with pt at PV today   

## 2021-11-23 ENCOUNTER — Other Ambulatory Visit: Payer: Self-pay | Admitting: Family Medicine

## 2021-11-23 ENCOUNTER — Ambulatory Visit: Payer: 59 | Admitting: Neurology

## 2021-11-23 DIAGNOSIS — Z1231 Encounter for screening mammogram for malignant neoplasm of breast: Secondary | ICD-10-CM

## 2021-11-23 NOTE — Telephone Encounter (Signed)
Received (1) 500 unit vial of Dysport from [facility].

## 2021-11-25 ENCOUNTER — Telehealth (HOSPITAL_BASED_OUTPATIENT_CLINIC_OR_DEPARTMENT_OTHER): Payer: Self-pay | Admitting: Cardiology

## 2021-11-25 ENCOUNTER — Other Ambulatory Visit (HOSPITAL_BASED_OUTPATIENT_CLINIC_OR_DEPARTMENT_OTHER): Payer: Self-pay

## 2021-11-25 DIAGNOSIS — I493 Ventricular premature depolarization: Secondary | ICD-10-CM

## 2021-11-25 NOTE — Telephone Encounter (Signed)
Left message for patient to call and discuss scheduling the Echocardiogram ordered by Dr. Christopher 

## 2021-12-01 ENCOUNTER — Encounter: Payer: Self-pay | Admitting: Neurology

## 2021-12-01 ENCOUNTER — Ambulatory Visit: Payer: 59 | Admitting: Neurology

## 2021-12-01 ENCOUNTER — Telehealth: Payer: Self-pay | Admitting: *Deleted

## 2021-12-01 VITALS — BP 150/87 | HR 89 | Ht 66.0 in | Wt 186.0 lb

## 2021-12-01 DIAGNOSIS — G243 Spasmodic torticollis: Secondary | ICD-10-CM | POA: Diagnosis not present

## 2021-12-01 NOTE — Progress Notes (Unsigned)
Dysport 500 units x 1 vial [id number] Exp-08/31/2023 [facility]

## 2021-12-01 NOTE — Telephone Encounter (Signed)
Received FMLA paperwork for patient. We completed the paperwork in 2022 but it has since been completed by Dr. Dennard Schaumann in March 2023. Per Dr. Estanislado Pandy, patient will need to contact her PCP to complete the paperwork.   Attempted to contact the patient and left message for patient to call the office.

## 2021-12-03 NOTE — Progress Notes (Signed)
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  Symptom onset was without apparent triggering event, she has gradually developed this neck pulling,  manifested primarily with right laterocollis, mild retrocolis, and left shoulder elevation since 2000. She has good relief with regular Botox injection, about 4 times a year since 2003. Last injection was February 18, 2010 by Dr. Marta Antu,  She reported great relief as usual, taking 4- 5 days to get symptom relieve, lasting about 3 months.  I began to inject her since 12.2011, every 3-4 months, dosage has been limited to 150 units of BOTOX A.  She denies neck pain, gait difficulty, but with wearing off Botox, she noticed increased head tremor, and pulling towards her right shoulder.  She developed  transient difficulty lifting her neck up when using BOTOX A 200 units previously, reponding well to decreased dose of 150 units  She was enrolled into IPSEN dysport study, first injection was in April 30th 2013, she later enrolled into open label, 2nd injection was in  May 28th 2013. She did very well.  Recent few weeks,  She had experienced flareup of her rheumatoid arthritis, has received IV infusion. Last injection was in 06/10/2012,  500 units of dysport was dissolved into 2 cc of normal saline. Right longissimus capitus 0.5 cc Right splenius capitis 0.5 cc Right levator scapular 0.5 cc Right iliocostalis 0.5 cc   Dysport research study has completed, she is now coming back for repeat injection, she noticed head bobbing over the past few weeks, she denies significant neck pain, weakness,  Her rheumatoid arthritis is under better control   Last injection was in Sep 2014, she did very well, no neck extension weakness, only rarely neck shaking  UPDATE March 8th 2016: She has lost follow-up since her last  visit December 2014, She came in for treatment her cervical dystonia, she feel the tension of her neck all the time, stiffness. Denied gait difficulty, last injection was September 2014, she received 500 units of Dysport, works well for her, she is continue on medication for anxiety, rheumatoid arthritis She also complains of nighttime snoring, frequent awakening, excessive daytime sleepiness, fatigue, today's ESS is 6, FSS was 19,  UPDATE October 13 2014:   She has missed her scheduled sleep study, continue have worsening neck posturing, posterior neck pain, excessive daytime fatigue and sleepiness  UPDATE Nov 11 2014: She came in for EMG guided Dysport injection today, last injection was December 2014, she complains of worsening head titubation, bilateral hands mild postural tremor, posterior neck pain,  Update February 17 2015 She responded very well to last EMG guided Dysport injection Nov 11 2014, no significant head titubation, no significant neck pain  Update July 22 2015: Last injection was August, now she noticed returning of neck pulling and shaking, posterior neck muscle achy pain  Update October 21 2015: Last EMG guided Dysport injection was in January, she responded very well, only recent couple weeks, she noticed returning of neck pulling, shaking again, She has worsening rheumatoid arthritis, is receiving more frequent treatment, complains fatigue, diffuse body achy pain  Update March 15 2016: She responded very well to previous Dysport injection in April, no significant side effect noticed, only recent few weeks, 5 months from previous injection, she noticed return of head shaking neck muscle tightness.  UPDATE Dec 14th 2017: She responded very well  to previous injection in Sept 2017, no significant side effect noticed.  Update September 13 2016: She did well this last injection in December  Update January 15 2017: She noticed worsening head tremor, she had excessive stress,  her sister passed away from lung cancer in April 2018,  UPDATE Apr 25 2017: She responded very well with previous injection, barely has noticeable head shaking.  Update August 01, 2017: She responded very well to previous dysport injection, only recently began to have recurrent head shaking  UPDATE December 19 2017: She responded well to previous injection  UPDATE Sept 25 2019: She responded very well to previous injection  Update February 2022 2023  She responded well to previous injection, her rheumatoid arthritis is under excellent control,  She reported sudden onset of acute vertigo on July 22, 2020 turning to the right side, nausea, unsteady gait at the beginning of the onset, over the past few weeks, her symptoms has improved, but with sudden positional change, especially turning towards the right side, it will trigger transient dizziness, she denies hearing loss.  REVIEW OF SYSTEMS: Full 14 system review of systems performed and notable only for ear pain, joint pain, joint swelling, achy muscles, muscle cramps, rash, tremors, depression, anxiety  ALLERGIES: No Known Allergies  HOME MEDICATIONS: Current Outpatient Medications  Medication Sig Dispense Refill   AbobotulinumtoxinA (DYSPORT) 500 units SOLR injection INJECT 500 UNITS  INTRAMUSCULARLY EVERY 3  MONTHS (GIVEN AT MD OFFICE, DISCARD UNUSED) 1 each 3   aspirin EC 81 MG tablet Take 1 tablet (81 mg total) by mouth daily. Swallow whole. 90 tablet 3   baclofen (LIORESAL) 10 MG tablet Take 10 mg by mouth daily as needed for muscle spasms.  (Patient not taking: Reported on 11/22/2021)     clonazePAM (KLONOPIN) 0.5 MG tablet TAKE 1 TABLET BY MOUTH TWICE DAILY AS NEEDED FOR 30 DAYS 60 tablet 0   CRANBERRY PO Take by mouth daily.     DULoxetine (CYMBALTA) 60 MG capsule TAKE 1 CAPSULE BY MOUTH DAILY 30 capsule 2   meclizine (ANTIVERT) 25 MG tablet Take 1 tablet (25 mg total) by mouth 3 (three) times daily as needed for dizziness.  (Patient not taking: Reported on 11/22/2021) 30 tablet 0   Multiple Vitamins-Minerals (MULTIVITAMINS THER. W/MINERALS) TABS Take 1 tablet by mouth at bedtime.      omeprazole (PRILOSEC) 20 MG capsule TAKE 1 CAPSULE BY MOUTH  DAILY 90 capsule 3   RINVOQ 15 MG TB24 TAKE 1 TABLET BY MOUTH DAILY 30 tablet 2   rosuvastatin (CRESTOR) 20 MG tablet TAKE 1 TABLET(20 MG) BY MOUTH DAILY 90 tablet 3   traMADol (ULTRAM) 50 MG tablet Take 1 - 2 every 8 hours as needed for pain. 30 tablet 0   TRELEGY ELLIPTA 200-62.5-25 MCG/INH AEPB INHALE 1 INHALATION BY  MOUTH INTO THE LUNGS DAILY 180 each 3   Xylitol (XYLIMELTS MT) Use as directed 2 tablets in the mouth or throat at bedtime as needed (dry mouth).      No current facility-administered medications for this visit.    PAST MEDICAL HISTORY: Past Medical History:  Diagnosis Date   Anxiety    Cervical dystonia    COPD (chronic obstructive pulmonary disease) ([place]) 10/2020   Depression    Fibromyalgia    GERD (gastroesophageal reflux disease)    History of shingles    RA (rheumatoid arthritis) ([facility])    Synovitis of hand    Vertigo     PAST SURGICAL HISTORY: Past  Surgical History:  Procedure Laterality Date   COLONOSCOPY  10/25/2009   TONSILLECTOMY     VAGINAL HYSTERECTOMY  09/01/2006    FAMILY HISTORY: Family History  Problem Relation Age of Onset   Arthritis Mother    Heart Problems Father    Heart disease Father    Heart disease Paternal Uncle        MI   Colon cancer Neg Hx    Colon polyps Neg Hx    Esophageal cancer Neg Hx    Rectal cancer Neg Hx    Stomach cancer Neg Hx     SOCIAL HISTORY:  Social History   Socioeconomic History   Marital status: Married    Spouse name: Courtney Myers   Number of children: 2   Years of education: 12   Highest education level: Not on file  Occupational History    Employer: [employer]  Tobacco Use   Smoking status: Former    Packs/day: 0.25    Years: 34.00    Pack years: 8.50    Types:  Cigarettes    Start date: 1979    Quit date: 11/16/2020    Years since quitting: 1.0   Smokeless tobacco: Never   Tobacco comments:    5-6 cigs per day/11/16/20  Vaping Use   Vaping Use: Never used  Substance and Sexual Activity   Alcohol use: Yes    Alcohol/week: 0.0 standard drinks    Comment: Rare, twice per year   Drug use: Never   Sexual activity: Yes    Birth control/protection: Surgical    Comment: 1st intercourse 61 yo-Fewer than 5 partners  Other Topics Concern   Not on file  Social History Narrative   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 Right handed.    Caffeine- 4 cups daily.   Social Determinants of Health   Financial Resource Strain: Not on file  Food Insecurity: Not on file  Transportation Needs: Not on file  Physical Activity: Not on file  Stress: Not on file  Social Connections: Not on file  Intimate Partner Violence: Not on file     PHYSICAL EXAM   Vitals:   12/01/21 1027  BP: (!) 150/87  Pulse: 89  Weight: 186 lb (84.4 kg)  Height: '5\' 6"'$  (1.676 m)    Gen: NAD, conversant, well nourised, well groomed             NEUROLOGICAL EXAM:  MENTAL STATUS: Speech/cognition: Awake, alert, oriented to history taking and casual conversation   CRANIAL NERVES: She has mild retrocollis, right tilt, slight left shoulder elevation CN II: Visual fields are full to confrontation.  Pupils are round equal and briskly reactive to light. CN III, IV, VI: extraocular movement are normal. No ptosis. CN V: Facial sensation is intact to pinprick in all 3 divisions bilaterally. Corneal responses are intact.  CN VII: Face is symmetric with normal eye closure and smile. CN VIII: Hearing is normal to casual conversation CN IX, X: Palate elevates symmetrically. Phonation is normal. CN XI: Head turning and shoulder shrug are intact  MOTOR: There is no pronator drift of out-stretched arms.  Muscle bulk and tone are normal. Muscle strength is normal.  COORDINATION: Rapid alternating movements and fine finger movements are intact. There is no dysmetria on finger-to-nose and heel-knee-shin.    GAIT/STANCE: Posture is normal. Gait is steady    I performed Epley's maneuver, right ear dependent position after short  latency will trigger rotatory downward beating nystagmus, habituate quickly, on the second repeat Epley's maneuver, the nystagmus and her vertigo sensation has much improved  DIAGNOSTIC DATA (LABS, IMAGING, TESTING) - I reviewed patient records, labs, notes, testing and imaging myself where available.  ASSESSMENT AND PLAN  Courtney Myers is a 61 y.o. female with long-standing history of cervical dystonia, responded very well to previous EMG guided Dysport injection, last injection was May 2016, responded very well.   Mild retrocollis, right tilt, slight left shoulder elevation  500 of Dysport was dissolved into 2.5 cc of normal saline,  Right longissimus capitus 0. 5 cc Left splenius capitis 0.5 cc Right inferior oblique capitis, 0.5 cc  Left inferior oblique capitis 0.5 cc Left levator scapular 0.5   Benign positional vertigo  Symptom onset since July 22, 2021,  I performed Epley maneuver, right ear dependent position will trigger downward beating rotatory nystagmus, quickly improved after 2 repeat procedures   Marcial Pacas, M.D. Ph.D.  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  Symptom onset was without apparent triggering event, she has gradually developed this neck pulling,  manifested primarily with right laterocollis, mild retrocolis, and left shoulder elevation since 2000.  She has good relief with regular Botox injection, about 4 times a year since 2003. Last injection was February 18, 2010 by Dr. Marta Antu,  She reported great relief as usual, taking 4- 5 days to get symptom relieve, lasting about 3 months.  I began to inject her since 12.2011, every 3-4 months, dosage has been limited to 150 units of BOTOX A.  She denies neck pain, gait difficulty, but with wearing off Botox, she noticed increased head tremor, and pulling towards her right shoulder.  She developed  transient difficulty lifting her neck up when using BOTOX A 200 units previously, reponding well to decreased dose of 150 units  She was enrolled into IPSEN dysport study, first injection was in April 30th 2013, she later enrolled into open label, 2nd injection was in  May 28th 2013. She did very well.  Recent few weeks,  She had experienced flareup of her rheumatoid arthritis, has received IV infusion. Last injection was in 06/10/2012,  500 units of dysport was dissolved into 2 cc of normal saline. Right longissimus capitus 0.5 cc Right splenius capitis 0.5 cc Right levator scapular 0.5 cc Right iliocostalis 0.5 cc   Dysport research study has completed, she is now coming back for repeat injection, she noticed head bobbing over the past few weeks, she denies significant neck pain, weakness,  Her rheumatoid arthritis is under better control   Last injection was in Sep 2014, she did very well, no neck extension weakness, only rarely neck shaking  UPDATE March 8th 2016: She has lost follow-up since her last visit December 2014, She came in for treatment her cervical dystonia, she feel the tension of her neck all the time, stiffness. Denied gait difficulty, last injection was September 2014, she received 500 units of Dysport, works well for her, she is continue on medication for anxiety, rheumatoid arthritis She also complains of nighttime snoring, frequent awakening, excessive daytime sleepiness, fatigue,  today's ESS is 6, FSS was 24,  UPDATE October 13 2014:   She has missed her  scheduled sleep study, continue have worsening neck posturing, posterior neck pain, excessive daytime fatigue and sleepiness  UPDATE Nov 11 2014: She came in for EMG guided Dysport injection today, last injection was December 2014, she complains of worsening head titubation, bilateral hands mild postural tremor, posterior neck pain,  Update February 17 2015 She responded very well to last EMG guided Dysport injection Nov 11 2014, no significant head titubation, no significant neck pain  Update July 22 2015: Last injection was August, now she noticed returning of neck pulling and shaking, posterior neck muscle achy pain  Update October 21 2015: Last EMG guided Dysport injection was in January, she responded very well, only recent couple weeks, she noticed returning of neck pulling, shaking again, She has worsening rheumatoid arthritis, is receiving more frequent treatment, complains fatigue, diffuse body achy pain  Update March 15 2016: She responded very well to previous Dysport injection in April, no significant side effect noticed, only recent few weeks, 5 months from previous injection, she noticed return of head shaking neck muscle tightness.  UPDATE Dec 14th 2017: She responded very well to previous injection in Sept 2017, no significant side effect noticed.  Update September 13 2016: She did well this last injection in December  Update January 15 2017: She noticed worsening head tremor, she had excessive stress, her sister passed away from lung cancer in April 2018,  UPDATE Apr 25 2017: She responded very well with previous injection, barely has noticeable head shaking.  Update August 01, 2017: She responded very well to previous dysport injection, only recently began to have recurrent head shaking  UPDATE December 19 2017: She responded well to previous injection  UPDATE Jun 20 2018: She did very well  with previous injection  UPDATE October 01 2018: She did well to previous injection  UPDATE January 01 2019: She did well with previous injection  UPDATE Oct 28th 2020: She did well to previous injections  UPDATE Jan 27th 2021: She did well to previous injections  Update October 29, 2019: She did well with previous injection, no significant side effect noted.  Update February 04, 2020, Uses Dysport 500 units, responding well  UPDATE Jun 02 2020: Uses Dysport 500 units, responding well  Update September 28, 2020: She responded well to previous injection, unfortunately she has been a longtime smoker, diagnosed with COPD, chest x-ray showed bibasilar atelectasis, and infiltration, small left pleural effusion on September 08, 2020  UPDATE December 01 2021: She did well with his previous injection,  PHYSICAL EXAM   Vitals:   12/01/21 1027  BP: (!) 150/87  Pulse: 89  Weight: 186 lb (84.4 kg)  Height: '5\' 6"'$  (1.676 m)   Not recorded     Body mass index is 30.02 kg/m.  PHYSICAL EXAMNIATION:   Mild retrocollis, right tilt, mild right lateral shift, she has moderate tenderness at bilateral levator scapular muscle   ASSESSMENT AND PLAN  Courtney Myers is a 61 y.o. female with long-standing history of cervical dystonia, responded very well to EMG guided Dysport injection,  Mild retrocollis, right tilt, mild left turn slight left shoulder elevation, frequent no-no titubation  500 of Dysport was dissolved into 2.5 cc of normal saline,  Right longissimus capitus 0. 5cc Right splenius capitis 0.25 c Right inferior oblique capitis 0.5 cc Left inferior oblique capitis 0.35 cc Left splenius cervix 0.25 Left splenius capitis 0.25 Right splenius cervix 0.25 cc Left levator scapular 0. 25 cc   Patient tolerate the injection well, will  return to clinic in 3 months for repeat injection Please dissolve 500 units of Dysport into 2.5 cc of normal saline  Benign positional vertigo, right ear  dependent firs.t Marcial Pacas, M.D. Ph.D.  [facility] [address] [place], [place] [address] Ph: [phone] Fax: [phone]

## 2021-12-05 ENCOUNTER — Telehealth: Payer: Self-pay | Admitting: Rheumatology

## 2021-12-05 NOTE — Telephone Encounter (Signed)
LMOM for patient to call office regarding FMLA paperwork.

## 2021-12-05 NOTE — Telephone Encounter (Signed)
Patient called the office stating she missed a call from Andrea and requests a call back.  

## 2021-12-06 NOTE — Telephone Encounter (Signed)
Attempted to contact the patient and left message for patient to call the office.  

## 2021-12-07 ENCOUNTER — Encounter: Payer: Self-pay | Admitting: Gastroenterology

## 2021-12-09 ENCOUNTER — Encounter: Payer: Self-pay | Admitting: Gastroenterology

## 2021-12-13 ENCOUNTER — Encounter: Payer: 59 | Admitting: Gastroenterology

## 2021-12-14 ENCOUNTER — Other Ambulatory Visit: Payer: Self-pay | Admitting: Family Medicine

## 2021-12-14 NOTE — Telephone Encounter (Signed)
Requested medication (s) are due for refill today - provider review   Requested medication (s) are on the active medication list -yes  Future visit scheduled -no  Last refill: 11/10/21 #60  Notes to clinic: non delegated Rx  Requested Prescriptions  Pending Prescriptions Disp Refills   clonazePAM (KLONOPIN) 0.5 MG tablet [Pharmacy Med Name: CLONAZEPAM 0.'5MG'$  TABLETS] 60 tablet     Sig: TAKE 1 TABLET BY MOUTH TWICE DAILY AS NEEDED FOR 30 DAYS     Not Delegated - Psychiatry: Anxiolytics/Hypnotics 2 Failed - 12/14/2021 10:29 AM      Failed - This refill cannot be delegated      Failed - Urine Drug Screen completed in last 360 days      Passed - Patient is not pregnant      Passed - Valid encounter within last 6 months    Recent Outpatient Visits           2 months ago Encounter for screening mammogram for malignant neoplasm of breast   [place] Susy Frizzle, MD   4 months ago Benign paroxysmal positional vertigo, unspecified laterality   [place] Pickard, Cammie Mcgee, MD   8 months ago Encounter for tobacco use cessation counseling   [place] Pickard, Cammie Mcgee, MD   1 year ago Rheumatoid arthritis involving multiple sites, unspecified whether rheumatoid factor present ([place])   [place] Pickard, Cammie Mcgee, MD   1 year ago Shortness of breath   [place], Cammie Mcgee, MD       Future Appointments             In 2 weeks Bo Merino, MD [place]   In 1 month Buford Dresser, MD [facility]               Requested Prescriptions  Pending Prescriptions Disp Refills   clonazePAM (KLONOPIN) 0.5 MG tablet [Pharmacy Med Name: CLONAZEPAM 0.'5MG'$  TABLETS] 60 tablet     Sig: TAKE 1 TABLET BY MOUTH TWICE DAILY AS NEEDED FOR 30 DAYS     Not Delegated - Psychiatry: Anxiolytics/Hypnotics 2 Failed - 12/14/2021 10:29 AM       Failed - This refill cannot be delegated      Failed - Urine Drug Screen completed in last 360 days      Passed - Patient is not pregnant      Passed - Valid encounter within last 6 months    Recent Outpatient Visits           2 months ago Encounter for screening mammogram for malignant neoplasm of breast   [place] Susy Frizzle, MD   4 months ago Benign paroxysmal positional vertigo, unspecified laterality   [place] Pickard, Cammie Mcgee, MD   8 months ago Encounter for tobacco use cessation counseling   [place] Susy Frizzle, MD   1 year ago Rheumatoid arthritis involving multiple sites, unspecified whether rheumatoid factor present [facility])   [place] Pickard, Cammie Mcgee, MD   1 year ago Shortness of breath   [place], Cammie Mcgee, MD       Future Appointments             In 2 weeks Bo Merino, MD [place]   In 62 month Buford Dresser, MD [facility]

## 2021-12-14 NOTE — Telephone Encounter (Signed)
Pt needs CPE appt medication review/refill. Pls call.  Thank you

## 2021-12-16 ENCOUNTER — Other Ambulatory Visit: Payer: Self-pay | Admitting: Family Medicine

## 2021-12-16 NOTE — Progress Notes (Signed)
Office Visit Note  Patient: Courtney Myers             Date of Birth: 06-01-1961           MRN: [id number]             PCP: Susy Frizzle, MD Referring: Susy Frizzle, MD Visit Date: 12/29/2021 Occupation: '@GUAROCC'$ @  Subjective:  medication management  History of Present Illness: Courtney Myers is a 61 y.o. female with history of seronegative rheumatoid arthritis.  She continues to be on Rinvoq 15 mg p.o. daily since October 2021.  Leflunomide was discontinued in December 2022 due to elevation in creatinine.  She continues to do well on monotherapy with Rinvoq 15 mg p.o. daily.  No recent infections you have been taking it on a regular basis Rinvoq.  She had a root canal 2 days ago and was given amoxicillin.  She did not to stop Rinvoq.  She has not had any infection.  She notices some discomfort in her joints towards the end of the day.  She has about morning stiffness lasting for about 15 minutes.  She has not noticed any joint swelling.  She has been experiencing some discomfort in her knee joints.  She noticed a rash under her right axillary region approximately 3 weeks ago.  She states that the lesions are not pruritic.  She uses hot compresses and they go down.  Activities of Daily Living:  Patient reports morning stiffness for 15 minutes.   Patient Denies nocturnal pain.  Difficulty dressing/grooming: Denies Difficulty climbing stairs: Denies Difficulty getting out of chair: Denies Difficulty using hands for taps, buttons, cutlery, and/or writing: Denies  Review of Systems  Constitutional:  Positive for fatigue.  HENT:  Positive for mouth dryness.   Eyes:  Negative for dryness.  Respiratory:  Positive for shortness of breath.        COPD  Cardiovascular:  Negative for swelling in legs/feet.  Gastrointestinal:  Negative for constipation.  Endocrine: Positive for heat intolerance and excessive thirst.  Genitourinary:  Negative for difficulty urinating.   Musculoskeletal:  Positive for joint pain, joint pain, muscle weakness, morning stiffness and muscle tenderness. Negative for gait problem and joint swelling.  Skin:  Positive for nodules/bumps. Negative for color change, rash and sensitivity to sunlight.  Allergic/Immunologic: Negative for susceptible to infections.  Neurological:  Positive for weakness.  Hematological:  Positive for bruising/bleeding tendency. Negative for swollen glands.  Psychiatric/Behavioral:  Positive for sleep disturbance. Negative for depressed mood. The patient is not nervous/anxious.     PMFS History:  Patient Active Problem List   Diagnosis Date Noted   Benign paroxysmal positional vertigo of right ear 08/25/2021   COPD (chronic obstructive pulmonary disease) ([facility]) 11/16/2020   Encounter for screening for lung cancer 12/29/2019   Excessive sleepiness 09/08/2014   Fibromyalgia syndrome 08/25/2014   RA (rheumatoid arthritis) (HCC)    GERD (gastroesophageal reflux disease)    Anxiety    Depression    History of shingles    Synovitis of hand    Cervical dystonia 12/04/2012    Past Medical History:  Diagnosis Date   Anxiety    Cervical dystonia    COPD (chronic obstructive pulmonary disease) ([facility]) 10/2020   Depression    Fibromyalgia    GERD (gastroesophageal reflux disease)    History of shingles    RA (rheumatoid arthritis) (HCC)    Synovitis of hand    Vertigo     Family History  Problem Relation Age of Onset   Arthritis Mother    Heart Problems Father    Heart disease Father    Heart disease Paternal Uncle        MI   Colon cancer Neg Hx    Colon polyps Neg Hx    Esophageal cancer Neg Hx    Rectal cancer Neg Hx    Stomach cancer Neg Hx    Past Surgical History:  Procedure Laterality Date   COLONOSCOPY  10/25/2009   TONSILLECTOMY     VAGINAL HYSTERECTOMY  09/01/2006   Social History   Social History Narrative   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 Right handed.    Caffeine- 4 cups daily.   Immunization History  Administered Date(s) Administered   Influenza,inj,Quad PF,6+ Mos 05/25/2018, 04/03/2019, 04/26/2020   Influenza-Unspecified 06/24/2014, 05/01/2015, 04/10/2016, 04/06/2017   Moderna Sars-Covid-2 Vaccination 08/29/2019, 09/26/2019, 06/19/2020   PNEUMOCOCCAL CONJUGATE-20 10/03/2021   Pneumococcal Conjugate-13 05/01/2015   Pneumococcal Polysaccharide-23 04/10/2016   Tdap 09/09/2015     Objective: Vital Signs: BP 99/66 (BP Location: Left Arm, Patient Position: Sitting, Cuff Size: Normal)   Pulse 87   Resp 16   Ht '5\' 6"'$  (1.676 m)   Wt 196 lb (88.9 kg)   BMI 31.64 kg/m    Physical Exam Vitals and nursing note reviewed.  Constitutional:      Appearance: She is well-developed.  HENT:     Head: Normocephalic and atraumatic.  Eyes:     Conjunctiva/sclera: Conjunctivae normal.  Cardiovascular:     Rate and Rhythm: Normal rate and regular rhythm.     Heart sounds: Normal heart sounds.  Pulmonary:     Effort: Pulmonary effort is normal.     Breath sounds: Normal breath sounds.  Abdominal:     General: Bowel sounds are normal.     Palpations: Abdomen is soft.  Musculoskeletal:     Cervical back: Normal range of motion.  Lymphadenopathy:     Cervical: No cervical adenopathy.  Skin:    General: Skin is warm and dry.     Capillary Refill: Capillary refill takes less than 2 seconds.     Comments: Right axillary folliculitis was noted.  Neurological:     Mental Status: She is alert and oriented to person, place, and time.  Psychiatric:        Behavior: Behavior normal.      Musculoskeletal Exam: C-spine was in good range of motion.  Shoulder joints, elbow joints, wrist joints, MCPs PIPs and DIPs with good range of motion with no synovitis.  Hip joints, knee joints, ankles, MTPs and PIPs with good range of motion with no swelling or synovitis.  CDAI Exam: CDAI  Score: 0.4  Patient Global: 2 mm; Provider Global: 2 mm Swollen: 0 ; Tender: 0  Joint Exam 12/29/2021   No joint exam has been documented for this visit   There is currently no information documented on the homunculus. Go to the Rheumatology activity and complete the homunculus joint exam.  Investigation: No additional findings.  Imaging: ECHOCARDIOGRAM COMPLETE  Result Date: 12/19/2021    ECHOCARDIOGRAM REPORT   Patient Name:   KIMIA FINAN Date of Exam: 12/19/2021 Medical Rec #:  [id number]              Height:       66.0 in Accession #:    [id number]  Weight:       186.0 lb Date of Birth:  1961/03/05              BSA:          1.939 m Patient Age:    52 years               BP:           160/92 mmHg Patient Gender: F                      HR:           77 bpm. Exam Location:  [address] Procedure: 2D Echo, 3D Echo, Cardiac Doppler and Color Doppler Indications:    I49.3 PVC's  History:        Patient has no prior history of Echocardiogram examinations.                 COPD. No cardiac history.  Sonographer:    Marygrace Drought RCS Referring Phys: [phone] [place]  1. Left ventricular ejection fraction, by estimation, is 40 to 45%. The left ventricle has mildly decreased function. The left ventricle demonstrates global hypokinesis. There is mild left ventricular hypertrophy. Left ventricular diastolic parameters are consistent with Grade I diastolic dysfunction (impaired relaxation). 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  2. Right ventricular systolic function is normal. The right ventricular size is normal. There is normal pulmonary artery systolic pressure. The estimated right ventricular systolic pressure is 73.2 mmHg.  3. The mitral valve is normal in structure. Trivial mitral valve regurgitation. No evidence of mitral stenosis.  4. The aortic valve is tricuspid. Aortic valve regurgitation is trivial. No aortic stenosis is  present.  5. The inferior vena cava is normal in size with greater than 50% respiratory variability, suggesting right atrial pressure of 3 mmHg. FINDINGS  Left Ventricle: Left ventricular ejection fraction, by estimation, is 40 to 45%. The left ventricle has mildly decreased function. The left ventricle demonstrates global hypokinesis. 3D left ventricular ejection fraction analysis performed but not reported based on interpreter judgement due to suboptimal tracking. The left ventricular internal cavity size was normal in size. There is mild left ventricular hypertrophy. Left ventricular diastolic parameters are consistent with Grade I diastolic dysfunction (impaired relaxation). Right Ventricle: The right ventricular size is normal. No increase in right ventricular wall thickness. Right ventricular systolic function is normal. There is normal pulmonary artery systolic pressure. The tricuspid regurgitant velocity is 2.73 m/s, and  with an assumed right atrial pressure of 3 mmHg, the estimated right ventricular systolic pressure is 20.2 mmHg. Left Atrium: Left atrial size was normal in size. Right Atrium: Right atrial size was normal in size. Pericardium: Trivial pericardial effusion is present. Mitral Valve: The mitral valve is normal in structure. Trivial mitral valve regurgitation. No evidence of mitral valve stenosis. Tricuspid Valve: The tricuspid valve is normal in structure. Tricuspid valve regurgitation is trivial. No evidence of tricuspid stenosis. Aortic Valve: The aortic valve is tricuspid. Aortic valve regurgitation is trivial. No aortic stenosis is present. Pulmonic Valve: The pulmonic valve was normal in structure. Pulmonic valve regurgitation is trivial. No evidence of pulmonic stenosis. Aorta: The aortic root is normal in size and structure. Venous: The inferior vena cava is normal in size with greater than 50% respiratory variability, suggesting right atrial pressure of 3 mmHg. IAS/Shunts: No atrial  level shunt detected by color flow Doppler.  LEFT VENTRICLE PLAX 2D LVIDd:  5.30 cm   Diastology LVIDs:         4.45 cm   LV e' medial:    5.44 cm/s LV PW:         1.10 cm   LV E/e' medial:  11.7 LV IVS:        1.00 cm   LV e' lateral:   3.92 cm/s LVOT diam:     2.20 cm   LV E/e' lateral: 16.2 LV SV:         74 LV SV Index:   38 LVOT Area:     3.80 cm                           3D Volume EF:                          3D EF:        35 %                          LV EDV:       151 ml                          LV ESV:       98 ml                          LV SV:        53 ml RIGHT VENTRICLE RV Basal diam:  3.20 cm RV S prime:     16.90 cm/s RVSP:           32.8 mmHg LEFT ATRIUM             Index        RIGHT ATRIUM           Index LA diam:        3.60 cm 1.86 cm/m   RA Pressure: 3.00 mmHg LA Vol (A2C):   42.5 ml 21.92 ml/m  RA Area:     11.30 cm LA Vol (A4C):   38.7 ml 19.96 ml/m  RA Volume:   24.20 ml  12.48 ml/m LA Biplane Vol: 41.8 ml 21.56 ml/m  AORTIC VALVE LVOT Vmax:   83.80 cm/s LVOT Vmean:  61.800 cm/s LVOT VTI:    0.195 m  AORTA Ao Root diam: 2.70 cm Ao Asc diam:  3.10 cm MITRAL VALVE                TRICUSPID VALVE MV Area (PHT):              TR Peak grad:   29.8 mmHg MV Decel Time:              TR Vmax:        273.00 cm/s MV E velocity: 63.40 cm/s   Estimated RAP:  3.00 mmHg MV A velocity: 103.00 cm/s  RVSP:           32.8 mmHg MV E/A ratio:  0.62                             SHUNTS                             Systemic  VTI:  0.20 m                             Systemic Diam: 2.20 cm Cherlynn Kaiser MD Electronically signed by Cherlynn Kaiser MD Signature Date/Time: 12/19/2021/12:52:21 PM    Final     Recent Labs: Lab Results  Component Value Date   WBC 8.2 10/28/2021   HGB 12.1 10/28/2021   PLT 288 10/28/2021   NA 139 10/28/2021   K 4.2 10/28/2021   CL 102 10/28/2021   CO2 28 10/28/2021   GLUCOSE 81 10/28/2021   BUN 22 10/28/2021   CREATININE 1.08 (H) 10/28/2021   BILITOT 0.3 10/28/2021    ALKPHOS 91 11/18/2020   AST 18 10/28/2021   ALT 17 10/28/2021   PROT 6.9 10/28/2021   ALBUMIN 4.2 11/18/2020   CALCIUM 9.5 10/28/2021   GFRAA 61 11/02/2020   QFTBGOLD Negative 09/14/2015   QFTBGOLDPLUS NEGATIVE 02/23/2021    Speciality Comments: Remicade '6mg'$ /kg every 6 weeks 07/13-09/21 Rinvoq 15 mg qd 04/28/20 MTX - dcd due to increase in [place] started 06/21/20-12/22- increase in cr   Procedures:  No procedures performed Allergies: Patient has no known allergies.   Assessment / Plan:     Visit Diagnoses: Rheumatoid arthritis of multiple sites with negative rheumatoid factor (HCC)-patient has been doing well on Rinvoq 15 mg p.o. daily.  She is stopped leflunomide in January 2023.  She has not had any flares of rheumatoid arthritis.  She states that she has some discomfort in her knee joints which she relates to weight gain.  She has not noticed any joint swelling.  She gets morning stiffness for few minutes and which resolves.  She has been very pleased to the response to Rinvoq.  High risk medication use - Rinvoq 15 mg 1 tablet by mouth daily (started 04/28/20).  Currently holding arava due to elevated creatinine on 07/07/2021.  (Initially started [place] 06/21/20).   -Labs obtained on October 28, 2021 were reviewed.  CBC was normal.  Creatinine improved at 1.08.  LFTs were normal.  TB gold was negative on February 23, 2021.  She was advised to get labs in July along with TB Gold.  Lipid panel was normal for 2023.  On plan: QuantiFERON-TB Gold Plus.  MACE blackbox warning-increased risk of blood clots, heart attack, stroke, lung cancer, malignancy with Rinvoq was reviewed.  Information reembolization was placed in the AVS.  She was also advised to hold Rinvoq if she develops an infection and resume after the infection resolves.  Sicca complex (HCC)-she continues to have dry mouth and dry eyes.  Over-the-counter products were discussed.  Chronic pain of both knees-she has been experiencing  some discomfort in her knee joints which she relates to weight gain.  Weight loss diet and exercise was discussed.  A handout on knee exercises was given.  Rash-she had axillary folliculitis which raises concern hidradenitis suppurativa.  I advised her to schedule an appointment with Dr. Denna Haggard.  Hot compresses were advised.  Fibromyalgia -she continues to have some generalized pain and discomfort intermittently.  Need for regular exercise was emphasized.  She is on Cymbalta 60 mg 1 capsule daily.  Chronic fatigue-related to fibromyalgia and insomnia.  Primary insomnia-good sleep hygiene was discussed.  Anxiety and depression  History of COPD -she was evaluated by Dr. Vaughan Browner on 09/23/20.   History of gastroesophageal reflux (GERD)  Orders: Orders Placed This Encounter  Procedures   QuantiFERON-TB Gold  Plus   No orders of the defined types were placed in this encounter.    Follow-Up Instructions: Return for Rheumatoid arthritis.   Bo Merino, MD  Note - This record has been created using Editor, commissioning.  Chart creation errors have been sought, but may not always  have been located. Such creation errors do not reflect on  the standard of medical care.

## 2021-12-19 ENCOUNTER — Ambulatory Visit (HOSPITAL_COMMUNITY): Payer: 59 | Attending: Internal Medicine

## 2021-12-19 ENCOUNTER — Other Ambulatory Visit: Payer: Self-pay | Admitting: Family Medicine

## 2021-12-19 ENCOUNTER — Encounter: Payer: Self-pay | Admitting: Family Medicine

## 2021-12-19 DIAGNOSIS — I493 Ventricular premature depolarization: Secondary | ICD-10-CM

## 2021-12-19 LAB — ECHOCARDIOGRAM COMPLETE
Area-P 1/2: 4.57 cm2
S' Lateral: 4.45 cm

## 2021-12-20 ENCOUNTER — Encounter: Payer: Self-pay | Admitting: Family Medicine

## 2021-12-20 ENCOUNTER — Other Ambulatory Visit: Payer: Self-pay | Admitting: Family Medicine

## 2021-12-20 MED ORDER — CLONAZEPAM 0.5 MG PO TABS
ORAL_TABLET | ORAL | 3 refills | Status: DC
Start: 2021-12-20 — End: 2022-04-24

## 2021-12-20 NOTE — Telephone Encounter (Signed)
Pls advice?   Did you want me to call pt in for appt? Pt had OV 4/23?

## 2021-12-28 ENCOUNTER — Other Ambulatory Visit: Payer: Self-pay | Admitting: Physician Assistant

## 2021-12-28 NOTE — Telephone Encounter (Signed)
Next Visit: 6/329/2023  Last Visit: 07/27/2021  Last Fill: 10/10/2021  GE:[id number] arthritis of multiple sites with negative rheumatoid factor  Current Dose per office note 07/27/2021: Rinvoq 15 mg 1 tablet by mouth daily   Labs: 10/28/2021, CBC WNL.  Creatinine is borderline elevated-1.08-trending down.  GFR is borderline elevated but improving-58. Rest of CMP WNL.  TB Gold: 02/23/2021,  TB Gold is negative.  Okay to refill Rinvoq?

## 2021-12-29 ENCOUNTER — Telehealth: Payer: 59 | Admitting: Physician Assistant

## 2021-12-29 ENCOUNTER — Encounter: Payer: Self-pay | Admitting: Rheumatology

## 2021-12-29 ENCOUNTER — Ambulatory Visit: Payer: 59 | Admitting: Rheumatology

## 2021-12-29 VITALS — BP 99/66 | HR 87 | Resp 16 | Ht 66.0 in | Wt 196.0 lb

## 2021-12-29 DIAGNOSIS — M25562 Pain in left knee: Secondary | ICD-10-CM

## 2021-12-29 DIAGNOSIS — Z8719 Personal history of other diseases of the digestive system: Secondary | ICD-10-CM

## 2021-12-29 DIAGNOSIS — Z8709 Personal history of other diseases of the respiratory system: Secondary | ICD-10-CM

## 2021-12-29 DIAGNOSIS — M797 Fibromyalgia: Secondary | ICD-10-CM

## 2021-12-29 DIAGNOSIS — M0609 Rheumatoid arthritis without rheumatoid factor, multiple sites: Secondary | ICD-10-CM

## 2021-12-29 DIAGNOSIS — F419 Anxiety disorder, unspecified: Secondary | ICD-10-CM

## 2021-12-29 DIAGNOSIS — N390 Urinary tract infection, site not specified: Secondary | ICD-10-CM

## 2021-12-29 DIAGNOSIS — R21 Rash and other nonspecific skin eruption: Secondary | ICD-10-CM

## 2021-12-29 DIAGNOSIS — R5382 Chronic fatigue, unspecified: Secondary | ICD-10-CM

## 2021-12-29 DIAGNOSIS — M3500 Sicca syndrome, unspecified: Secondary | ICD-10-CM | POA: Diagnosis not present

## 2021-12-29 DIAGNOSIS — F5101 Primary insomnia: Secondary | ICD-10-CM

## 2021-12-29 DIAGNOSIS — G8929 Other chronic pain: Secondary | ICD-10-CM

## 2021-12-29 DIAGNOSIS — Z79899 Other long term (current) drug therapy: Secondary | ICD-10-CM

## 2021-12-29 DIAGNOSIS — M25561 Pain in right knee: Secondary | ICD-10-CM | POA: Diagnosis not present

## 2021-12-29 DIAGNOSIS — F32A Depression, unspecified: Secondary | ICD-10-CM

## 2021-12-29 NOTE — Patient Instructions (Addendum)
Standing Labs We placed an order today for your standing lab work.   Please have your standing labs drawn in July and every 3 months  Please get TB Gold with your next labs your immunization  If possible, please have your labs drawn 2 weeks prior to your appointment so that the provider can discuss your results at your appointment.  Please note that you may see your imaging and lab results in [place] before we have reviewed them. We may be awaiting multiple results to interpret others before contacting you. Please allow our office up to 72 hours to thoroughly review all of the results before contacting the office for clarification of your results.  We have open lab daily: Monday through Thursday from 1:30-4:30 PM and Friday from 1:30-4:00 PM at the office of Dr. Bo Merino, [facility].   Please be advised, all patients with office appointments requiring lab work will take precedent over walk-in lab work.  If possible, please come for your lab work on Monday and Friday afternoons, as you may experience shorter wait times. The office is located at [address], [place], [place], [place] [address] No appointment is necessary.   Labs are drawn by [facility]. Please bring your co-pay at the time of your lab draw.  You may receive a bill from [facility] for your lab work.  Please note if you are on Hydroxychloroquine and and an order has been placed for a Hydroxychloroquine level, you will need to have it drawn 4 hours or more after your last dose.  If you wish to have your labs drawn at another location, please call the office 24 hours in advance to send orders.  If you have any questions regarding directions or hours of operation,  please call [phone].   As a reminder, please drink plenty of water prior to coming for your lab work. Thanks!   Vaccines You are taking a medication(s) that can suppress your immune system.  The following immunizations are recommended: Flu  annually Covid-19  Td/Tdap (tetanus, diphtheria, pertussis) every 10 years Pneumonia (Prevnar 15 then Pneumovax 23 at least 1 year apart.  Alternatively, can take Prevnar 20 without needing additional dose) Shingrix: 2 doses from 4 weeks to 6 months apart  Please check with your PCP to make sure you are up to date.   If you have signs or symptoms of an infection or start antibiotics: First, call your PCP for workup of your infection. Hold your medication through the infection, until you complete your antibiotics, and until symptoms resolve if you take the following: Injectable medication (Actemra, Benlysta, Cimzia, Cosentyx, Enbrel, Humira, Kevzara, Orencia, Remicade, Simponi, Stelara, Taltz, Tremfya) Methotrexate Leflunomide (Arava) Mycophenolate (Cellcept) Morrie Sheldon, Olumiant, or Rinvoq  COVID-19 vaccine recommendations:   COVID-19 vaccine is recommended for everyone (unless you are allergic to a vaccine component), even if you are on a medication that suppresses your immune system.   If you are on Methotrexate, Cellcept (mycophenolate), Rinvoq, Morrie Sheldon, and Olumiant- hold the medication for 1 week after each vaccine. Hold Methotrexate for 2 weeks after the single dose COVID-19 vaccine.     Do not take Tylenol or any anti-inflammatory medications (NSAIDs) 24 hours prior to the COVID-19 vaccination.     [email or web address]/[email or web address]-Patient-Resources.[email or web address]  Knee Exercises Ask your health care provider which exercises are safe for you. Do exercises exactly as told by your health care provider and adjust them as directed. It is normal to feel mild stretching, pulling, tightness, or discomfort as you do  these exercises. Stop right away if you feel sudden pain or your pain gets worse. Do not begin these exercises until told by your health care provider. Stretching and range-of-motion exercises These exercises warm up your muscles and joints and improve  the movement and flexibility of your knee. These exercises also help to relieve pain and swelling. Knee extension, prone  Lie on your abdomen (prone position) on a bed. Place your left / right knee just beyond the edge of the surface so your knee is not on the bed. You can put a towel under your left / right thigh just above your kneecap for comfort. Relax your leg muscles and allow gravity to straighten your knee (extension). You should feel a stretch behind your left / right knee. Hold this position for __________ seconds. Scoot up so your knee is supported between repetitions. Repeat __________ times. Complete this exercise __________ times a day. Knee flexion, active  Lie on your back with both legs straight. If this causes back discomfort, bend your left / right knee so your foot is flat on the floor. Slowly slide your left / right heel back toward your buttocks. Stop when you feel a gentle stretch in the front of your knee or thigh (flexion). Hold this position for __________ seconds. Slowly slide your left / right heel back to the starting position. Repeat __________ times. Complete this exercise __________ times a day. Quadriceps stretch, prone  Lie on your abdomen on a firm surface, such as a bed or padded floor. Bend your left / right knee and hold your ankle. If you cannot reach your ankle or pant leg, loop a belt around your foot and grab the belt instead. Gently pull your heel toward your buttocks. Your knee should not slide out to the side. You should feel a stretch in the front of your thigh and knee (quadriceps). Hold this position for __________ seconds. Repeat __________ times. Complete this exercise __________ times a day. Hamstring, supine  Lie on your back (supine position). Loop a belt or towel over the ball of your left / right foot. The ball of your foot is on the walking surface, right under your toes. Straighten your left / right knee and slowly pull on the belt  to raise your leg until you feel a gentle stretch behind your knee (hamstring). Do not let your knee bend while you do this. Keep your other leg flat on the floor. Hold this position for __________ seconds. Repeat __________ times. Complete this exercise __________ times a day. Strengthening exercises These exercises build strength and endurance in your knee. Endurance is the ability to use your muscles for a long time, even after they get tired. Quadriceps, isometric This exercise strengthens the muscles in front of your thigh (quadriceps) without moving your knee joint (isometric). Lie on your back with your left / right leg extended and your other knee bent. Put a rolled towel or small pillow under your knee if told by your health care provider. Slowly tense the muscles in the front of your left / right thigh. You should see your kneecap slide up toward your hip or see increased dimpling just above the knee. This motion will push the back of the knee toward the floor. For __________ seconds, hold the muscle as tight as you can without increasing your pain. Relax the muscles slowly and completely. Repeat __________ times. Complete this exercise __________ times a day. Straight leg raises This exercise strengthens the muscles in front  of your thigh (quadriceps) and the muscles that move your hips (hip flexors). Lie on your back with your left / right leg extended and your other knee bent. Tense the muscles in the front of your left / right thigh. You should see your kneecap slide up or see increased dimpling just above the knee. Your thigh may even shake a bit. Keep these muscles tight as you raise your leg 4-6 inches (10-15 cm) off the floor. Do not let your knee bend. Hold this position for __________ seconds. Keep these muscles tense as you lower your leg. Relax your muscles slowly and completely after each repetition. Repeat __________ times. Complete this exercise __________ times a  day. Hamstring, isometric  Lie on your back on a firm surface. Bend your left / right knee about __________ degrees. Dig your left / right heel into the surface as if you are trying to pull it toward your buttocks. Tighten the muscles in the back of your thighs (hamstring) to "dig" as hard as you can without increasing any pain. Hold this position for __________ seconds. Release the tension gradually and allow your muscles to relax completely for __________ seconds after each repetition. Repeat __________ times. Complete this exercise __________ times a day. Hamstring curls If told by your health care provider, do this exercise while wearing ankle weights. Begin with __________lb / kg weights. Then increase the weight by 1 lb (0.5 kg) increments. Do not wear ankle weights that are more than __________lb / kg. Lie on your abdomen with your legs straight. Bend your left / right knee as far as you can without feeling pain. Keep your hips flat against the floor. Hold this position for __________ seconds. Slowly lower your leg to the starting position. Repeat __________ times. Complete this exercise __________ times a day. Squats This exercise strengthens the muscles in front of your thigh and knee (quadriceps). Stand in front of a table, with your feet and knees pointing straight ahead. You may rest your hands on the table for balance but not for support. Slowly bend your knees and lower your hips like you are going to sit in a chair. Keep your weight over your heels, not over your toes. Keep your lower legs upright so they are parallel with the table legs. Do not let your hips go lower than your knees. Do not bend lower than told by your health care provider. If your knee pain increases, do not bend as low. Hold the squat position for __________ seconds. Slowly push with your legs to return to standing. Do not use your hands to pull yourself to standing. Repeat __________ times. Complete this  exercise __________ times a day. Wall slides This exercise strengthens the muscles in front of your thigh and knee (quadriceps). Lean your back against a smooth wall or door, and walk your feet out 18-24 inches (46-61 cm) from it. Place your feet hip-width apart. Slowly slide down the wall or door until your knees bend __________ degrees. Keep your knees over your heels, not over your toes. Keep your knees in line with your hips. Hold this position for __________ seconds. Repeat __________ times. Complete this exercise __________ times a day. Straight leg raises, side-lying This exercise strengthens the muscles that rotate the leg at the hip and move it away from your body (hip abductors). Lie on your side with your left / right leg in the top position. Lie so your head, shoulder, knee, and hip line up. You may bend  your bottom knee to help you keep your balance. Roll your hips slightly forward so your hips are stacked directly over each other and your left / right knee is facing forward. Leading with your heel, lift your top leg 4-6 inches (10-15 cm). You should feel the muscles in your outer hip lifting. Do not let your foot drift forward. Do not let your knee roll toward the ceiling. Hold this position for __________ seconds. Slowly return your leg to the starting position. Let your muscles relax completely after each repetition. Repeat __________ times. Complete this exercise __________ times a day. Straight leg raises, prone This exercise stretches the muscles that move your hips away from the front of the pelvis (hip extensors). Lie on your abdomen on a firm surface. You can put a pillow under your hips if that is more comfortable. Tense the muscles in your buttocks and lift your left / right leg about 4-6 inches (10-15 cm). Keep your knee straight as you lift your leg. Hold this position for __________ seconds. Slowly lower your leg to the starting position. Let your leg relax  completely after each repetition. Repeat __________ times. Complete this exercise __________ times a day. This information is not intended to replace advice given to you by your health care provider. Make sure you discuss any questions you have with your health care provider. Document Revised: 03/01/2021 Document Reviewed: 03/01/2021 Elsevier Patient Education  [place].

## 2022-01-13 ENCOUNTER — Other Ambulatory Visit: Payer: Self-pay | Admitting: Physician Assistant

## 2022-01-13 ENCOUNTER — Ambulatory Visit: Payer: 59 | Admitting: Family Medicine

## 2022-01-13 VITALS — BP 120/80 | HR 80 | Temp 97.6°F | Ht 66.0 in | Wt 197.0 lb

## 2022-01-13 DIAGNOSIS — L02411 Cutaneous abscess of right axilla: Secondary | ICD-10-CM | POA: Diagnosis not present

## 2022-01-13 MED ORDER — DOXYCYCLINE HYCLATE 100 MG PO TABS: 100.0000 mg | ORAL_TABLET | Freq: Two times a day (BID) | ORAL | 0 refills | Status: DC

## 2022-01-13 NOTE — Progress Notes (Signed)
Subjective:    Patient ID: Courtney Myers, female    DOB: [birth year], 61 y.o.   MRN: [id number]  HPI  The last couple weeks, the patient has had tender swollen nodules in her right axilla.  On examination there are 4 erythematous superficial nodular lesions that are tender to touch.  Each of them appears to be a small boil.  One is approximately 5 mm in size.  The others are smaller than that.  They are tender.  There is no significant fluctuance underneath the skin.  There is no lymphadenopathy.  I have the clinical appearance similar to hidradenitis suppurativa however the patient denies any previous history of this.  She has been avoiding shaving her armpits due to concern that that may be causing infection  Past Medical History:  Diagnosis Date  . Anxiety   . Cervical dystonia   . COPD (chronic obstructive pulmonary disease) ([place]) 10/2020  . Depression   . Fibromyalgia   . GERD (gastroesophageal reflux disease)   . History of shingles   . RA (rheumatoid arthritis) ([place])   . Synovitis of hand   . Vertigo    Past Surgical History:  Procedure Laterality Date  . COLONOSCOPY  10/25/2009  . TONSILLECTOMY    . VAGINAL HYSTERECTOMY  09/01/2006   Current Outpatient Medications on File Prior to Visit  Medication Sig Dispense Refill  . AbobotulinumtoxinA (DYSPORT) 500 units SOLR injection INJECT 500 UNITS  INTRAMUSCULARLY EVERY 3  MONTHS (GIVEN AT MD OFFICE, DISCARD UNUSED) 1 each 3  . amoxicillin (AMOXIL) 500 MG tablet Take 500 mg by mouth 4 (four) times daily.    Marland Kitchen aspirin EC 81 MG tablet Take 1 tablet (81 mg total) by mouth daily. Swallow whole. 90 tablet 3  . clonazePAM (KLONOPIN) 0.5 MG tablet TAKE 1 TABLET BY MOUTH TWICE DAILY AS NEEDED FOR 30 DAYS 60 tablet 3  . CRANBERRY PO Take by mouth daily.    . DULoxetine (CYMBALTA) 60 MG capsule TAKE 1 CAPSULE BY MOUTH DAILY 30 capsule 2  . Multiple Vitamins-Minerals (MULTIVITAMINS THER. W/MINERALS) TABS Take 1 tablet by mouth at  bedtime.     Marland Kitchen omeprazole (PRILOSEC) 20 MG capsule TAKE 1 CAPSULE BY MOUTH  DAILY 90 capsule 3  . RINVOQ 15 MG TB24 TAKE 1 TABLET BY MOUTH DAILY 30 tablet 2  . rosuvastatin (CRESTOR) 20 MG tablet TAKE 1 TABLET(20 MG) BY MOUTH DAILY 90 tablet 3  . traMADol (ULTRAM) 50 MG tablet Take 1 - 2 every 8 hours as needed for pain. 30 tablet 0  . TRELEGY ELLIPTA 200-62.5-25 MCG/INH AEPB INHALE 1 INHALATION BY  MOUTH INTO THE LUNGS DAILY 180 each 3  . Xylitol (XYLIMELTS MT) Use as directed 2 tablets in the mouth or throat at bedtime as needed (dry mouth).      No current facility-administered medications on file prior to visit.   No Known Allergies Social History   Socioeconomic History  . Marital status: Married    Spouse name: [name]  . Number of children: 2  . Years of education: 20  . Highest education level: Not on file  Occupational History    Employer: [employer]  Tobacco Use  . Smoking status: Former    Packs/day: 0.25    Years: 34.00    Total pack years: 8.50    Types: Cigarettes    Start date: 29    Quit date: 11/16/2020    Years since quitting: 1.1  . Smokeless tobacco: Never  .  Tobacco comments:    5-6 cigs per day/11/16/20  Vaping Use  . Vaping Use: Never used  Substance and Sexual Activity  . Alcohol use: Yes    Alcohol/week: 0.0 standard drinks of alcohol    Comment: Rare, twice per year  . Drug use: Never  . Sexual activity: Yes    Birth control/protection: Surgical    Comment: 1st intercourse 61 yo-Fewer than 5 partners  Other Topics Concern  . 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 Right handed.    Caffeine- 4 cups daily.   Social Determinants of Health   Financial Resource Strain: Not on file  Food Insecurity: Not on file  Transportation Needs: Not on file  Physical Activity: Not on file  Stress: Not on file  Social Connections:  Not on file  Intimate Partner Violence: Not on file     Review of Systems  All other systems reviewed and are negative.      Objective:   Physical Exam Vitals reviewed.  Constitutional:      General: She is not in acute distress.    Appearance: Normal appearance. She is normal weight. She is not ill-appearing, toxic-appearing or diaphoretic.  HENT:     Right Ear: Tympanic membrane and ear canal normal.     Left Ear: Tympanic membrane and ear canal normal.     Nose: Nose normal. No congestion or rhinorrhea.     Mouth/Throat:     Mouth: Mucous membranes are moist.     Pharynx: Oropharynx is clear.  Cardiovascular:     Rate and Rhythm: Normal rate and regular rhythm.     Heart sounds: Normal heart sounds.  Pulmonary:     Effort: Pulmonary effort is normal.     Breath sounds: Normal breath sounds.  Chest:    Neurological:     Mental Status: She is alert.          Assessment & Plan:  Cutaneous abscess of right axilla This is a very small, we will try doxycycline 100 mg p.o. twice daily for 7 days.  If not improving, patient can return for incision and drainage but I feel that this will likely treat the infection causing the resolve.

## 2022-01-18 ENCOUNTER — Encounter (HOSPITAL_BASED_OUTPATIENT_CLINIC_OR_DEPARTMENT_OTHER): Payer: Self-pay | Admitting: Cardiology

## 2022-01-18 ENCOUNTER — Ambulatory Visit (HOSPITAL_BASED_OUTPATIENT_CLINIC_OR_DEPARTMENT_OTHER): Payer: 59 | Admitting: Cardiology

## 2022-01-18 VITALS — BP 126/84 | HR 81 | Ht 66.0 in | Wt 199.0 lb

## 2022-01-18 DIAGNOSIS — I429 Cardiomyopathy, unspecified: Secondary | ICD-10-CM

## 2022-01-18 DIAGNOSIS — I493 Ventricular premature depolarization: Secondary | ICD-10-CM

## 2022-01-18 DIAGNOSIS — I2510 Atherosclerotic heart disease of native coronary artery without angina pectoris: Secondary | ICD-10-CM

## 2022-01-18 DIAGNOSIS — Z8249 Family history of ischemic heart disease and other diseases of the circulatory system: Secondary | ICD-10-CM | POA: Diagnosis not present

## 2022-01-18 DIAGNOSIS — M069 Rheumatoid arthritis, unspecified: Secondary | ICD-10-CM | POA: Diagnosis not present

## 2022-01-18 DIAGNOSIS — Z87891 Personal history of nicotine dependence: Secondary | ICD-10-CM

## 2022-01-18 DIAGNOSIS — I700 Atherosclerosis of aorta: Secondary | ICD-10-CM

## 2022-01-18 MED ORDER — METOPROLOL SUCCINATE ER 25 MG PO TB24: 25.0000 mg | ORAL_TABLET | Freq: Every day | ORAL | 11 refills | Status: DC

## 2022-01-18 NOTE — Patient Instructions (Addendum)
Medication Instructions:  We are going to start metoprolol succinate 25 mg daily. You can take morning or night (I'd start with morning, but if it makes you tired, try night time). If you feel worse, let me know. If you feel better but the feeling better levels off, let me know and we will go up on the dose of the metoprolol. We will meet again in 6 weeks to see if we can optimize medications any more, and we will recheck the echo in 3 mos.  *If you need a refill on your cardiac medications before your next appointment, please call your pharmacy*   Lab Work: None ordered today   Testing/Procedures: None ordered today   Follow-Up: At [facility], you and your health needs are our priority.  As part of our continuing mission to provide you with exceptional heart care, we have created designated Provider Care Teams.  These Care Teams include your primary Cardiologist (physician) and Advanced Practice Providers (APPs -  Physician Assistants and Nurse Practitioners) who all work together to provide you with the care you need, when you need it.  We recommend signing up for the patient portal called "MyChart".  Sign up information is provided on this After Visit Summary.  MyChart is used to connect with patients for Virtual Visits (Telemedicine).  Patients are able to view lab/test results, encounter notes, upcoming appointments, etc.  Non-urgent messages can be sent to your provider as well.   To learn more about what you can do with MyChart, go to [email or web address].    Your next appointment:   6 week(s)  The format for your next appointment:   In Person  Provider:   Buford Dresser, MD

## 2022-01-18 NOTE — Progress Notes (Signed)
Cardiology Office Note:    Date:  01/18/2022   ID:  Ward Chatters, DOB [birth year], MRN [id number]  PCP:  Susy Frizzle, MD  Cardiologist:  Buford Dresser, MD  Referring MD: Susy Frizzle, MD   CC: Follow-up  History of Present Illness:    Courtney Myers is a 61 y.o. female with a a hx of rheumatoid arthritis, chest pain who was initially seen 06/16/2020 as a new consult at the request of Susy Frizzle, MD for the evaluation and management of cardiovascular risk. She presents today for follow-up.  Today, she overall is doing well and is accompanied with her husband. She reports gaining 20 lbs since she has quit smoking. Her husband states that she occasionally experiences fatigue. The most strenuous activity for her is working 8-5 each week. She says its more challenging to get ready for work each morning.  She is not as active as her husband, but they have been following the mediterranean diet. This has helped them lose weight.   Of note, her and her husband have quit smoking at this time. She also plans to retire in a few months.  We reviewed her echo results today.  The patient denies chest pain, shortness of breath, nocturnal dyspnea, orthopnea or peripheral edema. There have been no palpitations, lightheadedness or syncope. She remains complaint with rosuvastatin as well as her other medications.  Past Medical History:  Diagnosis Date   Anxiety    Cervical dystonia    COPD (chronic obstructive pulmonary disease) ([place]) 10/2020   Depression    Fibromyalgia    GERD (gastroesophageal reflux disease)    History of shingles    RA (rheumatoid arthritis) (HCC)    Synovitis of hand    Vertigo     Past Surgical History:  Procedure Laterality Date   COLONOSCOPY  10/25/2009   TONSILLECTOMY     VAGINAL HYSTERECTOMY  09/01/2006    Current Medications: Current Outpatient Medications on File Prior to Visit  Medication Sig   AbobotulinumtoxinA  (DYSPORT) 500 units SOLR injection INJECT 500 UNITS  INTRAMUSCULARLY EVERY 3  MONTHS (GIVEN AT MD OFFICE, DISCARD UNUSED)   aspirin EC 81 MG tablet Take 1 tablet (81 mg total) by mouth daily. Swallow whole.   clonazePAM (KLONOPIN) 0.5 MG tablet TAKE 1 TABLET BY MOUTH TWICE DAILY AS NEEDED FOR 30 DAYS   CRANBERRY PO Take by mouth daily. (Patient not taking: Reported on 01/13/2022)   doxycycline (VIBRA-TABS) 100 MG tablet Take 1 tablet (100 mg total) by mouth 2 (two) times daily.   DULoxetine (CYMBALTA) 60 MG capsule TAKE 1 CAPSULE BY MOUTH DAILY   Multiple Vitamins-Minerals (MULTIVITAMINS THER. W/MINERALS) TABS Take 1 tablet by mouth at bedtime.    omeprazole (PRILOSEC) 20 MG capsule TAKE 1 CAPSULE BY MOUTH  DAILY   RINVOQ 15 MG TB24 TAKE 1 TABLET BY MOUTH DAILY   rosuvastatin (CRESTOR) 20 MG tablet TAKE 1 TABLET(20 MG) BY MOUTH DAILY   traMADol (ULTRAM) 50 MG tablet Take 1 - 2 every 8 hours as needed for pain.   TRELEGY ELLIPTA 200-62.5-25 MCG/INH AEPB INHALE 1 INHALATION BY  MOUTH INTO THE LUNGS DAILY   Xylitol (XYLIMELTS MT) Use as directed 2 tablets in the mouth or throat at bedtime as needed (dry mouth).    No current facility-administered medications on file prior to visit.     Allergies:   Patient has no known allergies.   Social History   Tobacco Use   Smoking status:  Former    Packs/day: 0.25    Years: 34.00    Total pack years: 8.50    Types: Cigarettes    Start date: 1979    Quit date: 11/16/2020    Years since quitting: 1.1   Smokeless tobacco: Never   Tobacco comments:    5-6 cigs per day/11/16/20  Vaping Use   Vaping Use: Never used  Substance Use Topics   Alcohol use: Yes    Alcohol/week: 0.0 standard drinks of alcohol    Comment: Rare, twice per year   Drug use: Never    Family History: family history includes Arthritis in her mother; Heart Problems in her father; Heart disease in her father and paternal uncle. There is no history of Colon cancer, Colon polyps,  Esophageal cancer, Rectal cancer, or Stomach cancer.  ROS:   Please see the history of present illness. (+) Weight gain (+) Fatigue  All other systems are reviewed and negative.    EKGs/Labs/Other Studies Reviewed:    The following studies were reviewed today: Echo 12/19/2021: IMPRESSIONS    1. Left ventricular ejection fraction, by estimation, is 40 to 45%. The  left ventricle has mildly decreased function. The left ventricle  demonstrates global hypokinesis. There is mild left ventricular  hypertrophy. Left ventricular diastolic parameters  are consistent with Grade I diastolic dysfunction (impaired relaxation).  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   2. Right ventricular systolic function is normal. The right ventricular  size is normal. There is normal pulmonary artery systolic pressure. The  estimated right ventricular systolic pressure is 19.4 mmHg.   3. The mitral valve is normal in structure. Trivial mitral valve  regurgitation. No evidence of mitral stenosis.   4. The aortic valve is tricuspid. Aortic valve regurgitation is trivial.  No aortic stenosis is present.   5. The inferior vena cava is normal in size with greater than 50%  respiratory variability, suggesting right atrial pressure of 3 mmHg.   CT Chest 09/29/2020: COMPARISON:  06/16/2020 chest CT angiogram.   FINDINGS: Cardiovascular: Normal heart size. No significant pericardial effusion/thickening. Left anterior descending coronary atherosclerosis. Great vessels are normal in course and caliber.   Mediastinum/Nodes: Hypodense 2.2 cm anterior left thyroid nodule, stable. Unremarkable esophagus. No pathologically enlarged axillary, mediastinal or hilar lymph nodes, noting limited sensitivity for the detection of hilar adenopathy on this noncontrast study.   Lungs/Pleura: No pneumothorax. No pleural effusion. Moderate centrilobular and paraseptal emphysema with mild diffuse  bronchial wall thickening. No acute consolidative airspace disease or lung masses. A few tiny scattered solid peripheral right pulmonary nodules, largest 0.3 cm in the right upper lobe (series 3/image 51), stable. No new significant pulmonary nodules. Mild patchy ground-glass opacities in the peripheral upper lobes bilaterally with a confluent ground-glass centrilobular micronodular character, unchanged. No significant regions of subpleural reticulation, traction bronchiectasis, architectural distortion or frank honeycombing. No significant lobular air trapping or evidence of tracheobronchomalacia on the expiration sequence.   Upper abdomen: No acute abnormality. Stable 1.4 cm left adrenal adenoma with density -6 HU.   Musculoskeletal: No aggressive appearing focal osseous lesions. Mild thoracic spondylosis.   IMPRESSION: 1. Mild patchy confluent centrilobular ground-glass micronodularity in the upper lobes, stable, compatible with smoking related bronchiolitis (respiratory bronchiolitis if no current pulmonary symptoms versus respiratory bronchiolitis-interstitial lung disease (RB-ILD) if current pulmonary symptoms are present). 2. One vessel coronary atherosclerosis. 3. Hypodense 2.2 cm anterior left thyroid nodule, stable. Recommend thyroid US (ref: J Am Coll Radiol. 2015 Feb;12(2): 143-50). 4.  Stable left adrenal adenoma. 5. Emphysema (ICD10-J43.9).  CT noncon 12/29/19 noted mild aortic atherosclerosis and LAD coronary calcium.  EKG:  ECG is personally reviewed today 01/18/2022 EKG: EKG was not ordered.  07/18/2021: sinus rhythm with frequent PVCs 11/18/2020: EKG was not ordered. 09/08/2020: Sinus tachycardia at 101 bpm, borderline RAE  Recent Labs: 10/28/2021: ALT 17; BUN 22; Creat 1.08; Hemoglobin 12.1; Platelets 288; Potassium 4.2; Sodium 139   Recent Lipid Panel    Component Value Date/Time   CHOL 132 10/03/2021 0959   CHOL 149 11/18/2020 0839   TRIG 68 10/03/2021 0959    HDL 71 10/03/2021 0959   HDL 61 11/18/2020 0839   CHOLHDL 1.9 10/03/2021 0959   VLDL 14 09/09/2015 0826   LDLCALC 47 10/03/2021 0959    Physical Exam:    VS:  BP 126/84   Pulse 81   Ht '5\' 6"'$  (1.676 m)   Wt 199 lb (90.3 kg)   BMI 32.12 kg/m     Wt Readings from Last 3 Encounters:  01/13/22 197 lb (89.4 kg)  12/29/21 196 lb (88.9 kg)  12/01/21 186 lb (84.4 kg)    GEN: Well nourished, well developed in no acute distress HEENT: Normal, moist mucous membranes NECK: No JVD appreciated CARDIAC: regular rhythm, normal S1 and S2, no rubs or gallops. No murmur. VASCULAR: Radial and DP pulses 2+ bilaterally. No carotid bruits RESPIRATORY:  clear to auscultation without rales, wheezing or rhonchi  ABDOMEN: Soft, non-tender, non-distended MUSCULOSKELETAL:  Ambulates independently SKIN: Warm and dry, no edema NEUROLOGIC:  Alert and oriented x 3. No focal neuro deficits noted. PSYCHIATRIC:  Normal affect    ASSESSMENT:    1. Cardiomyopathy, unspecified type ([place])   2. PVC (premature ventricular contraction)   3. Family history of heart disease   4. Rheumatoid arthritis involving multiple sites, unspecified whether rheumatoid factor present ([place])   5. History of tobacco abuse   6. Coronary artery calcification seen on CT scan   7. Aortic atherosclerosis (HCC)    PLAN:    Cardiomyopathy, with EF 40-45% 12/2021 -we reviewed this today, discussed possible etiologies. Discussed ischemic vs nonischemic, how we determine this -discussed guideline directed medical therapy -after discussion, will start metoprolol succinate. Follow up in 6 weeks. If BP room at that visit, can either increase metoprolol or add additional medication -reviewed red flag warning signs that need immediate medical attention  Family history of heart disease (father's side) Long term inflammatory disease (rheumatoid arthritis) Prior tobacco use Aortic atherosclerosis Coronary calcium: -no further chest  pain -tolerating aspirin/statin. Last LDL 47  PVCs -she is asymptomatic, but this may be potential etiology of cardiomyopathy (see above)  -she has vertigo symptoms but no syncope -we discussed a monitor. Wishes to hold on this.  Cardiac risk counseling and prevention recommendations: -recommend heart healthy/Mediterranean diet, with whole grains, fruits, vegetable, fish, lean meats, nuts, and olive oil. Limit salt. -recommend moderate walking, 3-5 times/week for 30-50 minutes each session. Aim for at least 150 minutes.week. Goal should be pace of 3 miles/hours, or walking 1.5 miles in 30 minutes -recommend avoidance of tobacco products. Avoid excess alcohol  Plan for follow up: 6 weeks per or sooner as needed.   Medication Adjustments/Labs and Tests Ordered: Current medicines are reviewed at length with the patient today.  Concerns regarding medicines are outlined above.   No orders of the defined types were placed in this encounter.  Meds ordered this encounter  Medications   metoprolol succinate (TOPROL-XL) 25 MG 24  hr tablet    Sig: Take 1 tablet (25 mg total) by mouth daily. Take with or immediately following a meal.    Dispense:  30 tablet    Refill:  11   Patient Instructions  Medication Instructions:  We are going to start metoprolol succinate 25 mg daily. You can take morning or night (I'd start with morning, but if it makes you tired, try night time). If you feel worse, let me know. If you feel better but the feeling better levels off, let me know and we will go up on the dose of the metoprolol. We will meet again in 6 weeks to see if we can optimize medications any more, and we will recheck the echo in 3 mos.  *If you need a refill on your cardiac medications before your next appointment, please call your pharmacy*   Lab Work: None ordered today   Testing/Procedures: None ordered today   Follow-Up: At [facility], you and your health needs are our priority.   As part of our continuing mission to provide you with exceptional heart care, we have created designated Provider Care Teams.  These Care Teams include your primary Cardiologist (physician) and Advanced Practice Providers (APPs -  Physician Assistants and Nurse Practitioners) who all work together to provide you with the care you need, when you need it.  We recommend signing up for the patient portal called "MyChart".  Sign up information is provided on this After Visit Summary.  MyChart is used to connect with patients for Virtual Visits (Telemedicine).  Patients are able to view lab/test results, encounter notes, upcoming appointments, etc.  Non-urgent messages can be sent to your provider as well.   To learn more about what you can do with MyChart, go to [email or web address].    Your next appointment:   6 week(s)  The format for your next appointment:   In Person  Provider:   Buford Dresser, MD          Rondell Reams as a scribe for Buford Dresser, MD.,have documented all relevant documentation on the behalf of Buford Dresser, MD,as directed by  Buford Dresser, MD while in the presence of Buford Dresser, MD.   I, Buford Dresser, MD, have reviewed all documentation for this visit. The documentation on 02/26/22 for the exam, diagnosis, procedures, and orders are all accurate and complete.   Buford Dresser, MD, PhD, [facility] at [facility] at [facility] [address] [place], [place] [address] [phone]

## 2022-01-26 ENCOUNTER — Ambulatory Visit (AMBULATORY_SURGERY_CENTER): Payer: 59 | Admitting: Gastroenterology

## 2022-01-26 ENCOUNTER — Telehealth: Payer: Self-pay | Admitting: Pharmacist

## 2022-01-26 ENCOUNTER — Encounter: Payer: Self-pay | Admitting: Gastroenterology

## 2022-01-26 VITALS — BP 121/52 | HR 70 | Temp 97.8°F | Resp 19 | Ht 66.0 in | Wt 180.0 lb

## 2022-01-26 DIAGNOSIS — D120 Benign neoplasm of cecum: Secondary | ICD-10-CM

## 2022-01-26 DIAGNOSIS — K621 Rectal polyp: Secondary | ICD-10-CM | POA: Diagnosis not present

## 2022-01-26 DIAGNOSIS — D122 Benign neoplasm of ascending colon: Secondary | ICD-10-CM

## 2022-01-26 DIAGNOSIS — D124 Benign neoplasm of descending colon: Secondary | ICD-10-CM | POA: Diagnosis not present

## 2022-01-26 DIAGNOSIS — Z1211 Encounter for screening for malignant neoplasm of colon: Secondary | ICD-10-CM

## 2022-01-26 DIAGNOSIS — D128 Benign neoplasm of rectum: Secondary | ICD-10-CM

## 2022-01-26 MED ORDER — SODIUM CHLORIDE 0.9 % IV SOLN: 500.0000 mL | Freq: Once | INTRAVENOUS | Status: AC

## 2022-01-26 NOTE — Progress Notes (Signed)
VS completed by AS.   Pt's states no medical or surgical changes since previsit or office visit.

## 2022-01-26 NOTE — Telephone Encounter (Signed)
Submitted a Prior Authorization RENEWAL request to [facility] for RINVOQ via CoverMyMeds. Will update once we receive a response.  Key: [id number]  Knox Saliva, PharmD, MPH, BCPS, CPP Clinical Pharmacist (Rheumatology and Pulmonology)

## 2022-01-26 NOTE — Progress Notes (Signed)
[facility] History and Physical   Primary Care Physician:  Susy Frizzle, MD   Reason for Procedure:  Colorectal cancer screening  Plan:    Screening colonoscopy with possible interventions as needed     HPI: Courtney Myers is a very pleasant 61 y.o. female here for screening colonoscopy. Denies any nausea, vomiting, abdominal pain, melena or bright red blood per rectum  The risks and benefits as well as alternatives of endoscopic procedure(s) have been discussed and reviewed. All questions answered. The patient agrees to proceed.    Past Medical History:  Diagnosis Date   Anxiety    Cervical dystonia    COPD (chronic obstructive pulmonary disease) ([place]) 10/2020   Depression    Fibromyalgia    GERD (gastroesophageal reflux disease)    History of shingles    RA (rheumatoid arthritis) (HCC)    Synovitis of hand    Vertigo     Past Surgical History:  Procedure Laterality Date   COLONOSCOPY  10/25/2009   TONSILLECTOMY     VAGINAL HYSTERECTOMY  09/01/2006    Prior to Admission medications   Medication Sig Start Date End Date Taking? Authorizing Provider  aspirin EC 81 MG tablet Take 1 tablet (81 mg total) by mouth daily. Swallow whole. 06/16/20  Yes Buford Dresser, MD  clonazePAM (KLONOPIN) 0.5 MG tablet TAKE 1 TABLET BY MOUTH TWICE DAILY AS NEEDED FOR 30 DAYS 12/20/21  Yes Susy Frizzle, MD  doxycycline (VIBRA-TABS) 100 MG tablet Take 1 tablet (100 mg total) by mouth 2 (two) times daily. 01/13/22  Yes Susy Frizzle, MD  DULoxetine (CYMBALTA) 60 MG capsule TAKE 1 CAPSULE BY MOUTH DAILY 11/07/21  Yes Ofilia Neas, PA-C  metoprolol succinate (TOPROL-XL) 25 MG 24 hr tablet Take 1 tablet (25 mg total) by mouth daily. Take with or immediately following a meal. 01/18/22 01/13/23 Yes Buford Dresser, MD  Multiple Vitamins-Minerals (MULTIVITAMINS THER. W/MINERALS) TABS Take 1 tablet by mouth at bedtime.    Yes [provider]   omeprazole (PRILOSEC) 20 MG capsule TAKE 1 CAPSULE BY MOUTH  DAILY 08/23/21  Yes Susy Frizzle, MD  RINVOQ 15 MG TB24 TAKE 1 TABLET BY MOUTH DAILY 12/28/21  Yes Deveshwar, Abel Presto, MD  rosuvastatin (CRESTOR) 20 MG tablet TAKE 1 TABLET(20 MG) BY MOUTH DAILY 06/29/21  Yes Buford Dresser, MD  TRELEGY ELLIPTA 200-62.5-25 MCG/INH AEPB INHALE 1 INHALATION BY  MOUTH INTO THE LUNGS DAILY 01/10/21  Yes Mannam, Praveen, MD  Xylitol (XYLIMELTS MT) Use as directed 2 tablets in the mouth or throat at bedtime as needed (dry mouth).    Yes [provider]  AbobotulinumtoxinA (DYSPORT) 500 units SOLR injection INJECT 500 UNITS  INTRAMUSCULARLY EVERY 3  MONTHS (GIVEN AT MD OFFICE, DISCARD UNUSED) 10/27/21   Marcial Pacas, MD  CRANBERRY PO Take by mouth daily. Patient not taking: Reported on 01/26/2022    [provider]  traMADol (ULTRAM) 50 MG tablet Take 1 - 2 every 8 hours as needed for pain. 10/22/17   Orlena Sheldon, PA-C    Current Outpatient Medications  Medication Sig Dispense Refill   aspirin EC 81 MG tablet Take 1 tablet (81 mg total) by mouth daily. Swallow whole. 90 tablet 3   clonazePAM (KLONOPIN) 0.5 MG tablet TAKE 1 TABLET BY MOUTH TWICE DAILY AS NEEDED FOR 30 DAYS 60 tablet 3   doxycycline (VIBRA-TABS) 100 MG tablet Take 1 tablet (100 mg total) by mouth 2 (two) times daily. 14 tablet 0  DULoxetine (CYMBALTA) 60 MG capsule TAKE 1 CAPSULE BY MOUTH DAILY 30 capsule 2   metoprolol succinate (TOPROL-XL) 25 MG 24 hr tablet Take 1 tablet (25 mg total) by mouth daily. Take with or immediately following a meal. 30 tablet 11   Multiple Vitamins-Minerals (MULTIVITAMINS THER. W/MINERALS) TABS Take 1 tablet by mouth at bedtime.      omeprazole (PRILOSEC) 20 MG capsule TAKE 1 CAPSULE BY MOUTH  DAILY 90 capsule 3   RINVOQ 15 MG TB24 TAKE 1 TABLET BY MOUTH DAILY 30 tablet 2   rosuvastatin (CRESTOR) 20 MG tablet TAKE 1 TABLET(20 MG) BY MOUTH DAILY 90 tablet 3   TRELEGY ELLIPTA 200-62.5-25  MCG/INH AEPB INHALE 1 INHALATION BY  MOUTH INTO THE LUNGS DAILY 180 each 3   Xylitol (XYLIMELTS MT) Use as directed 2 tablets in the mouth or throat at bedtime as needed (dry mouth).      AbobotulinumtoxinA (DYSPORT) 500 units SOLR injection INJECT 500 UNITS  INTRAMUSCULARLY EVERY 3  MONTHS (GIVEN AT MD OFFICE, DISCARD UNUSED) 1 each 3   CRANBERRY PO Take by mouth daily. (Patient not taking: Reported on 01/26/2022)     traMADol (ULTRAM) 50 MG tablet Take 1 - 2 every 8 hours as needed for pain. 30 tablet 0   Current Facility-Administered Medications  Medication Dose Route Frequency Provider Last Rate Last Admin   0.9 %  sodium chloride infusion  500 mL Intravenous Once Mauri Pole, MD        Allergies as of 01/26/2022   (No Known Allergies)    Family History  Problem Relation Age of Onset   Arthritis Mother    Heart Problems Father    Heart disease Father    Heart disease Paternal Uncle        MI   Colon cancer Neg Hx    Colon polyps Neg Hx    Esophageal cancer Neg Hx    Rectal cancer Neg Hx    Stomach cancer Neg Hx     Social History   Socioeconomic History   Marital status: Married    Spouse name: Courtney Myers   Number of children: 2   Years of education: 12   Highest education level: Not on file  Occupational History    Employer: [employer]  Tobacco Use   Smoking status: Former    Packs/day: 0.25    Years: 34.00    Total pack years: 8.50    Types: Cigarettes    Start date: 1979    Quit date: 11/16/2020    Years since quitting: 1.1   Smokeless tobacco: Never   Tobacco comments:    5-6 cigs per day/11/16/20  Vaping Use   Vaping Use: Never used  Substance and Sexual Activity   Alcohol use: Yes    Alcohol/week: 0.0 standard drinks of alcohol    Comment: Rare, twice per year   Drug use: Never   Sexual activity: Yes    Birth control/protection: Surgical    Comment: 1st intercourse 61 yo-Fewer than 5 partners  Other Topics Concern   Not on file  Social  History Narrative   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 Right handed.    Caffeine- 4 cups daily.   Social Determinants of Health   Financial Resource Strain: Not on file  Food Insecurity: Not on file  Transportation Needs: Not on file  Physical Activity: Not on file  Stress: Not on file  Social Connections: Not on file  Intimate Partner Violence: Not on file    Review of Systems:  All other review of systems negative except as mentioned in the HPI.  Physical Exam: Vital signs in last 24 hours: BP (!) 146/70   Pulse (!) 55   Temp 97.8 F (36.6 C) (Temporal)   Ht '5\' 6"'$  (1.676 m)   Wt 180 lb (81.6 kg)   SpO2 95%   BMI 29.05 kg/m  General:   Alert, NAD Lungs:  Clear .   Heart:  Regular rate and rhythm Abdomen:  Soft, nontender and nondistended. Neuro/Psych:  Alert and cooperative. Normal mood and affect. A and O x 3  Reviewed labs, radiology imaging, old records and pertinent past GI work up  Patient is appropriate for planned procedure(s) and anesthesia in an ambulatory setting   K. Denzil Magnuson , MD [phone]

## 2022-01-26 NOTE — Patient Instructions (Signed)
YOU HAD AN ENDOSCOPIC PROCEDURE TODAY AT [facility]:   Refer to the procedure report that was given to you for any specific questions about what was found during the examination.  If the procedure report does not answer your questions, please call your gastroenterologist to clarify.  If you requested that your care partner not be given the details of your procedure findings, then the procedure report has been included in a sealed envelope for you to review at your convenience later.  YOU SHOULD EXPECT: Some feelings of bloating in the abdomen. Passage of more gas than usual.  Walking can help get rid of the air that was put into your GI tract during the procedure and reduce the bloating. If you had a lower endoscopy (such as a colonoscopy or flexible sigmoidoscopy) you may notice spotting of blood in your stool or on the toilet paper. If you underwent a bowel prep for your procedure, you may not have a normal bowel movement for a few days.  Please Note:  You might notice some irritation and congestion in your nose or some drainage.  This is from the oxygen used during your procedure.  There is no need for concern and it should clear up in a day or so.  SYMPTOMS TO REPORT IMMEDIATELY:  Following lower endoscopy (colonoscopy or flexible sigmoidoscopy):  Excessive amounts of blood in the stool  Significant tenderness or worsening of abdominal pains  Swelling of the abdomen that is new, acute  Fever of 100F or higher   For urgent or emergent issues, a gastroenterologist can be reached at any hour by calling [phone]. Do not use MyChart messaging for urgent concerns.    DIET:  We do recommend a small meal at first, but then you may proceed to your regular diet.  Drink plenty of fluids but you should avoid alcoholic beverages for 24 hours.  MEDICATIONS: Continue present medications.  Please see handouts given to you by your recovery nurse.  Thank you for allowing [place] to  provide for your healthcare needs today.  ACTIVITY:  You should plan to take it easy for the rest of today and you should NOT DRIVE or use heavy machinery until tomorrow (because of the sedation medicines used during the test).    FOLLOW UP: Our staff will call the number listed on your records the next business day following your procedure.  We will call around 7:15- 8:00 am to check on you and address any questions or concerns that you may have regarding the information given to you following your procedure. If we do not reach you, we will leave a message.  If you develop any symptoms (ie: fever, flu-like symptoms, shortness of breath, cough etc.) before then, please call [phone].  If you test positive for Covid 19 in the 2 weeks post procedure, please call and report this information to [place].    If any biopsies were taken you will be contacted by phone or by letter within the next 1-3 weeks.  Please call us at [phone] if you have not heard about the biopsies in 3 weeks.    SIGNATURES/CONFIDENTIALITY: You and/or your care partner have signed paperwork which will be entered into your electronic medical record.  These signatures attest to the fact that that the information above on your After Visit Summary has been reviewed and is understood.  Full responsibility of the confidentiality of this discharge information lies with you and/or your care-partner.

## 2022-01-26 NOTE — Telephone Encounter (Signed)
Received notification from [facility] regarding a prior authorization for [facility]. Authorization has been APPROVED from 01/26/22 to 01/27/23.   Patient must continue to fill through [facility]: [phone]   Authorization #  [id number]  Knox Saliva, PharmD, MPH, BCPS, CPP Clinical Pharmacist (Rheumatology and Pulmonology)

## 2022-01-26 NOTE — Progress Notes (Signed)
VSS, transported to PACU °

## 2022-01-26 NOTE — Op Note (Signed)
[place] Patient Name: Courtney Myers Procedure Date: 01/26/2022 11:11 AM MRN: [id number] Endoscopist: Mauri Pole , MD Age: 61 Referring MD:  Date of Birth: [birth year] Gender: Female Account #: [id number] Procedure:                Colonoscopy Indications:              Screening for colorectal malignant neoplasm Medicines:                Monitored Anesthesia Care Procedure:                Pre-Anesthesia Assessment:                           - Prior to the procedure, a History and Physical                            was performed, and patient medications and                            allergies were reviewed. The patient's tolerance of                            previous anesthesia was also reviewed. The risks                            and benefits of the procedure and the sedation                            options and risks were discussed with the patient.                            All questions were answered, and informed consent                            was obtained. Prior Anticoagulants: The patient has                            taken no previous anticoagulant or antiplatelet                            agents. ASA Grade Assessment: II - A patient with                            mild systemic disease. After reviewing the risks                            and benefits, the patient was deemed in                            satisfactory condition to undergo the procedure.                           After obtaining informed consent, the colonoscope  was passed under direct vision. Throughout the                            procedure, the patient's blood pressure, pulse, and                            oxygen saturations were monitored continuously. The                            Olympus PCF-[id number] ([id number]) Colonoscope was                            introduced through the anus and advanced to the the                            cecum,  identified by appendiceal orifice and                            ileocecal valve. The colonoscopy was performed                            without difficulty. The patient tolerated the                            procedure well. The quality of the bowel                            preparation was good. The ileocecal valve,                            appendiceal orifice, and rectum were photographed. Scope In: 11:18:17 AM Scope Out: 11:39:29 AM Scope Withdrawal Time: 0 hours 10 minutes 29 seconds  Total Procedure Duration: 0 hours 21 minutes 12 seconds  Findings:                 The perianal and digital rectal examinations were                            normal.                           Six sessile polyps were found in the rectum,                            descending colon, ascending colon and cecum. The                            polyps were 3 to 5 mm in size. These polyps were                            removed with a cold snare. Resection and retrieval                            were complete.  Scattered small-mouthed diverticula were found in                            the sigmoid colon and descending colon.                           Non-bleeding external and internal hemorrhoids were                            found during retroflexion. The hemorrhoids were                            small. Complications:            No immediate complications. Estimated Blood Loss:     Estimated blood loss was minimal. Impression:               - Six 3 to 5 mm polyps in the rectum, in the                            descending colon, in the ascending colon and in the                            cecum, removed with a cold snare. Resected and                            retrieved.                           - Diverticulosis in the sigmoid colon and in the                            descending colon.                           - Non-bleeding external and internal  hemorrhoids. Recommendation:           - Resume previous diet.                           - Continue present medications.                           - Await pathology results.                           - Repeat colonoscopy in 3 - 5 years for                            surveillance based on pathology results. Mauri Pole, MD 01/26/2022 11:47:06 AM This report has been signed electronically.

## 2022-01-26 NOTE — Progress Notes (Signed)
Called to room to assist during endoscopic procedure.  Patient ID and intended procedure confirmed with present staff. Received instructions for my participation in the procedure from the performing physician.  

## 2022-01-27 ENCOUNTER — Encounter: Payer: Self-pay | Admitting: Gastroenterology

## 2022-01-30 ENCOUNTER — Telehealth: Payer: Self-pay

## 2022-01-30 NOTE — Telephone Encounter (Signed)
  Follow up Call-     01/26/2022   10:40 AM  Call back number  Post procedure Call Back phone  # [phone]  Permission to leave phone message Yes     Patient questions:  Do you have a fever, pain , or abdominal swelling? No. Pain Score  0 *  Have you tolerated food without any problems? Yes.    Have you been able to return to your normal activities? Yes.    Do you have any questions about your discharge instructions: Diet   No. Medications  No. Follow up visit  No.  Do you have questions or concerns about your Care? No.  Actions: * If pain score is 4 or above: No action needed, pain <4.

## 2022-02-20 ENCOUNTER — Other Ambulatory Visit: Payer: Self-pay | Admitting: Pulmonary Disease

## 2022-02-26 ENCOUNTER — Encounter (HOSPITAL_BASED_OUTPATIENT_CLINIC_OR_DEPARTMENT_OTHER): Payer: Self-pay | Admitting: Cardiology

## 2022-02-26 DIAGNOSIS — Z8249 Family history of ischemic heart disease and other diseases of the circulatory system: Secondary | ICD-10-CM | POA: Insufficient documentation

## 2022-02-26 DIAGNOSIS — I493 Ventricular premature depolarization: Secondary | ICD-10-CM | POA: Insufficient documentation

## 2022-02-26 DIAGNOSIS — Z87891 Personal history of nicotine dependence: Secondary | ICD-10-CM | POA: Insufficient documentation

## 2022-02-26 DIAGNOSIS — I429 Cardiomyopathy, unspecified: Secondary | ICD-10-CM | POA: Insufficient documentation

## 2022-02-28 ENCOUNTER — Encounter: Payer: Self-pay | Admitting: Gastroenterology

## 2022-03-01 ENCOUNTER — Other Ambulatory Visit: Payer: Self-pay | Admitting: Pulmonary Disease

## 2022-03-02 ENCOUNTER — Telehealth: Payer: Self-pay | Admitting: Neurology

## 2022-03-02 NOTE — Telephone Encounter (Signed)
I called [phone] and spoke with Letta Median to check PA for CPT [place], [id number], and [id number]. States PA is not required for codes [place], [id number]. This med should be billed as B/B discussed verbally with Tori as well. Ref for the call [phone].

## 2022-03-02 NOTE — Telephone Encounter (Signed)
Patient has an appointment next week, I called to get delivery set up but her PA needs to renewed, it expired in June.

## 2022-03-02 NOTE — Telephone Encounter (Signed)
Dysport B/B is ready to go in the fridge

## 2022-03-08 ENCOUNTER — Ambulatory Visit: Payer: 59 | Admitting: Neurology

## 2022-03-09 ENCOUNTER — Ambulatory Visit (HOSPITAL_BASED_OUTPATIENT_CLINIC_OR_DEPARTMENT_OTHER): Payer: 59 | Admitting: Cardiology

## 2022-03-16 ENCOUNTER — Telehealth: Payer: Self-pay

## 2022-03-16 NOTE — Telephone Encounter (Signed)
I called pt and lvm asking for a call back if she would like to r/s her Dysport injections.

## 2022-03-21 ENCOUNTER — Inpatient Hospital Stay: Admission: RE | Admit: 2022-03-21 | Payer: 59 | Source: Ambulatory Visit

## 2022-03-22 ENCOUNTER — Other Ambulatory Visit: Payer: Self-pay | Admitting: *Deleted

## 2022-03-22 MED ORDER — DULOXETINE HCL 60 MG PO CPEP
60.0000 mg | ORAL_CAPSULE | Freq: Every day | ORAL | 2 refills | Status: DC
Start: 2022-03-22 — End: 2022-06-22

## 2022-03-22 NOTE — Telephone Encounter (Signed)
Refill request received via fax from [place]for Cymbalta  Next Visit: 05/04/2022  Last Visit: 12/29/2021  Last Fill: 11/07/2021  Dx: Fibromyalgia  Current Dose per office note on 12/29/2021: Cymbalta 60 mg 1 capsule daily  Okay to refill Cymbalta?

## 2022-03-27 ENCOUNTER — Telehealth: Payer: Self-pay

## 2022-03-27 NOTE — Telephone Encounter (Signed)
Pt's spouse called in to let pcp know that pt is experiencing same boils under arm. Pt has also taken all of antibiotics and the boils have not gone away. Pt would also like to know if there is something that could be sent in to pharmacy that may help with this or will pt have to come in for an ov with pcp?  Cb#: [phone]

## 2022-03-28 NOTE — Telephone Encounter (Signed)
My chart message sent for pt to call to schedule appointment. Mjp,lpn

## 2022-03-30 ENCOUNTER — Ambulatory Visit: Payer: 59 | Admitting: Family Medicine

## 2022-03-30 VITALS — BP 118/62 | HR 59 | Ht 66.0 in | Wt 204.0 lb

## 2022-03-30 DIAGNOSIS — L02411 Cutaneous abscess of right axilla: Secondary | ICD-10-CM

## 2022-03-30 MED ORDER — CLINDAMYCIN PHOSPHATE 1 % EX LOTN: TOPICAL_LOTION | Freq: Two times a day (BID) | CUTANEOUS | 2 refills | Status: DC

## 2022-03-30 NOTE — Progress Notes (Signed)
Subjective:    Patient ID: Courtney Myers, female    DOB: Apr 23, 1961, 61 y.o.   MRN: [id number]  HPI 01/13/22 The last couple weeks, the patient has had tender swollen nodules in her right axilla.  On examination there are 4 erythematous superficial nodular lesions that are tender to touch.  Each of them appears to be a small boil.  One is approximately 5 mm in size.  The others are smaller than that.  They are tender.  There is no significant fluctuance underneath the skin.  There is no lymphadenopathy.  I have the clinical appearance similar to hidradenitis suppurativa however the patient denies any previous history of this.  She has been avoiding shaving her armpits due to concern that that may be causing infection  At that time:  This is a very small, we will try doxycycline 100 mg p.o. twice daily for 7 days.  If not improving, patient can return for incision and drainage but I feel that this will likely treat the infection causing the resolve.  03/30/22 Patient has mildly erythematous and inflammatory nodules in her right axilla.  There is 3 or 4 of them.  Each is small and 3 to 5 mm in diameter.  None are fluctuant.  None are extremely tender.  However the patient is concerned because it keeps coming back.  Recently had to drain a similar abscess in her husband's left axilla.  I question if they may be reexposed in each other  Past Medical History:  Diagnosis Date   Anxiety    Cervical dystonia    COPD (chronic obstructive pulmonary disease) ([place]) 10/2020   Depression    Fibromyalgia    GERD (gastroesophageal reflux disease)    History of shingles    RA (rheumatoid arthritis) (HCC)    Synovitis of hand    Vertigo    Past Surgical History:  Procedure Laterality Date   COLONOSCOPY  10/25/2009   TONSILLECTOMY     VAGINAL HYSTERECTOMY  09/01/2006   Current Outpatient Medications on File Prior to Visit  Medication Sig Dispense Refill   AbobotulinumtoxinA (DYSPORT) 500  units SOLR injection INJECT 500 UNITS  INTRAMUSCULARLY EVERY 3  MONTHS (GIVEN AT MD OFFICE, DISCARD UNUSED) 1 each 3   aspirin EC 81 MG tablet Take 1 tablet (81 mg total) by mouth daily. Swallow whole. 90 tablet 3   clonazePAM (KLONOPIN) 0.5 MG tablet TAKE 1 TABLET BY MOUTH TWICE DAILY AS NEEDED FOR 30 DAYS 60 tablet 3   CRANBERRY PO Take by mouth daily.     doxycycline (VIBRA-TABS) 100 MG tablet Take 1 tablet (100 mg total) by mouth 2 (two) times daily. 14 tablet 0   DULoxetine (CYMBALTA) 60 MG capsule Take 1 capsule (60 mg total) by mouth daily. 30 capsule 2   metoprolol succinate (TOPROL-XL) 25 MG 24 hr tablet Take 1 tablet (25 mg total) by mouth daily. Take with or immediately following a meal. 30 tablet 11   Multiple Vitamins-Minerals (MULTIVITAMINS THER. W/MINERALS) TABS Take 1 tablet by mouth at bedtime.      omeprazole (PRILOSEC) 20 MG capsule TAKE 1 CAPSULE BY MOUTH  DAILY 90 capsule 3   RINVOQ 15 MG TB24 TAKE 1 TABLET BY MOUTH DAILY 30 tablet 2   rosuvastatin (CRESTOR) 20 MG tablet TAKE 1 TABLET(20 MG) BY MOUTH DAILY 90 tablet 3   traMADol (ULTRAM) 50 MG tablet Take 1 - 2 every 8 hours as needed for pain. 30 tablet 0   TRELEGY  ELLIPTA 200-62.5-25 MCG/INH AEPB INHALE 1 INHALATION BY  MOUTH INTO THE LUNGS DAILY 180 each 3   Xylitol (XYLIMELTS MT) Use as directed 2 tablets in the mouth or throat at bedtime as needed (dry mouth).      Current Facility-Administered Medications on File Prior to Visit  Medication Dose Route Frequency Provider Last Rate Last Admin   0.9 %  sodium chloride infusion  500 mL Intravenous Once Nandigam, Venia Minks, MD       No Known Allergies Social History   Socioeconomic History   Marital status: Married    Spouse name: [name]   Number of children: 2   Years of education: 12   Highest education level: Not on file  Occupational History    Employer: [employer]  Tobacco Use   Smoking status: Former    Packs/day: 0.25    Years: 34.00    Total pack  years: 8.50    Types: Cigarettes    Start date: 1979    Quit date: 11/16/2020    Years since quitting: 1.3   Smokeless tobacco: Never   Tobacco comments:    5-6 cigs per day/11/16/20  Vaping Use   Vaping Use: Never used  Substance and Sexual Activity   Alcohol use: Yes    Alcohol/week: 0.0 standard drinks of alcohol    Comment: Rare, twice per year   Drug use: Never   Sexual activity: Yes    Birth control/protection: Surgical    Comment: 1st intercourse 61 yo-Fewer than 5 partners  Other Topics Concern   Not on file  Social History Narrative   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 Right handed.    Caffeine- 4 cups daily.   Social Determinants of Health   Financial Resource Strain: Not on file  Food Insecurity: Not on file  Transportation Needs: Not on file  Physical Activity: Not on file  Stress: Not on file  Social Connections: Not on file  Intimate Partner Violence: Not on file     Review of Systems  All other systems reviewed and are negative.      Objective:   Physical Exam Vitals reviewed.  Constitutional:      General: She is not in acute distress.    Appearance: Normal appearance. She is normal weight. She is not ill-appearing, toxic-appearing or diaphoretic.  HENT:     Right Ear: Tympanic membrane and ear canal normal.     Left Ear: Tympanic membrane and ear canal normal.     Nose: Nose normal. No congestion or rhinorrhea.     Mouth/Throat:     Mouth: Mucous membranes are moist.     Pharynx: Oropharynx is clear.  Cardiovascular:     Rate and Rhythm: Normal rate and regular rhythm.     Heart sounds: Normal heart sounds.  Pulmonary:     Effort: Pulmonary effort is normal.     Breath sounds: Normal breath sounds.  Chest:    Neurological:     Mental Status: She is alert.           Assessment & Plan:  Cutaneous abscess of right axilla I will treat the patient  similar to how I would handle hidradenitis suppurativa.  Recommended trying clindamycin 1% lotion twice daily to the affected area for 1 month.  I believe that this may be getting past back and forth between the patient and her husband

## 2022-04-18 ENCOUNTER — Other Ambulatory Visit: Payer: Self-pay | Admitting: *Deleted

## 2022-04-18 DIAGNOSIS — Z79899 Other long term (current) drug therapy: Secondary | ICD-10-CM

## 2022-04-19 NOTE — Progress Notes (Signed)
CBC WNL.  Creatinine is elevated-1.17 and GFR is low-52 . Please advise the patient to avoid the use of NSAIDs.

## 2022-04-21 NOTE — Progress Notes (Unsigned)
Office Visit Note  Patient: Courtney Myers             Date of Birth: [birth year]           MRN: [id number]             PCP: Susy Frizzle, MD Referring: Susy Frizzle, MD Visit Date: 05/04/2022 Occupation: '@GUAROCC'$ @  Subjective:  Medication monitoring   History of Present Illness: Courtney Myers is a 61 y.o. female with history of seronegative rheumatoid arthritis and fibromyalgia.  She remains on rinvoq 15 mg 1 tablet by mouth daily.  She is tolerating Rinvoq without any side effects and has not missed any doses recently.  She denies any signs or symptoms of a rheumatoid arthritis flare.  Her morning stiffness has only been lasting 5 minutes daily.  She has not had any nocturnal pain.  She denies any difficulty with ADLs. She denies any recent or recurrent infections.  She received the annual flu shot and COVID-19 vaccine booster earlier this month.     Activities of Daily Living:  Patient reports morning stiffness for 5 minutes.   Patient Denies nocturnal pain.  Difficulty dressing/grooming: Denies Difficulty climbing stairs: Denies Difficulty getting out of chair: Denies Difficulty using hands for taps, buttons, cutlery, and/or writing: Denies  Review of Systems  Constitutional:  Positive for fatigue.  HENT:  Positive for mouth dryness. Negative for mouth sores.   Eyes:  Negative for dryness.  Respiratory:  Positive for shortness of breath.   Cardiovascular:  Negative for chest pain and palpitations.  Gastrointestinal:  Negative for blood in stool, constipation and diarrhea.  Endocrine: Negative for increased urination.  Genitourinary:  Negative for involuntary urination.  Musculoskeletal:  Positive for morning stiffness. Negative for joint pain, gait problem, joint pain, joint swelling, myalgias, muscle weakness, muscle tenderness and myalgias.  Skin:  Negative for color change, rash, hair loss and sensitivity to sunlight.  Allergic/Immunologic:  Positive for susceptible to infections.  Neurological:  Positive for headaches. Negative for dizziness.  Hematological:  Negative for swollen glands.  Psychiatric/Behavioral:  Negative for depressed mood and sleep disturbance. The patient is not nervous/anxious.     PMFS History:  Patient Active Problem List   Diagnosis Date Noted   History of tobacco abuse 02/26/2022   Family history of heart disease 02/26/2022   PVC (premature ventricular contraction) 02/26/2022   Cardiomyopathy ([place]) 02/26/2022   Benign paroxysmal positional vertigo of right ear 08/25/2021   COPD (chronic obstructive pulmonary disease) ([place]) 11/16/2020   Encounter for screening for lung cancer 12/29/2019   Excessive sleepiness 09/08/2014   Fibromyalgia syndrome 08/25/2014   RA (rheumatoid arthritis) (HCC)    GERD (gastroesophageal reflux disease)    Anxiety    Depression    History of shingles    Synovitis of hand    Cervical dystonia 12/04/2012    Past Medical History:  Diagnosis Date   Anxiety    Cervical dystonia    COPD (chronic obstructive pulmonary disease) ([place]) 10/2020   Depression    Fibromyalgia    GERD (gastroesophageal reflux disease)    History of shingles    RA (rheumatoid arthritis) (HCC)    Synovitis of hand    Vertigo     Family History  Problem Relation Age of Onset   Arthritis Mother    Heart Problems Father    Heart disease Father    Heart disease Paternal Uncle        MI  Colon cancer Neg Hx    Colon polyps Neg Hx    Esophageal cancer Neg Hx    Rectal cancer Neg Hx    Stomach cancer Neg Hx    Past Surgical History:  Procedure Laterality Date   COLONOSCOPY  10/25/2009   TONSILLECTOMY     VAGINAL HYSTERECTOMY  09/01/2006   Social History   Social History Narrative   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 Right handed.    Caffeine- 4 cups daily.   Immunization History  Administered  Date(s) Administered   Influenza,inj,Quad PF,6+ Mos 05/25/2018, 04/03/2019, 04/26/2020   Influenza-Unspecified 06/24/2014, 05/01/2015, 04/10/2016, 04/06/2017   Moderna Sars-Covid-2 Vaccination 08/29/2019, 09/26/2019, 06/19/2020   PNEUMOCOCCAL CONJUGATE-20 10/03/2021   Pneumococcal Conjugate-13 05/01/2015   Pneumococcal Polysaccharide-23 04/10/2016   Tdap 09/09/2015     Objective: Vital Signs: BP 120/74 (BP Location: Left Arm, Patient Position: Sitting, Cuff Size: Normal)   Pulse 66   Resp 17   Ht '5\' 6"'$  (1.676 m)   Wt 204 lb 9.6 oz (92.8 kg)   BMI 33.02 kg/m    Physical Exam Vitals and nursing note reviewed.  Constitutional:      Appearance: She is well-developed.  HENT:     Head: Normocephalic and atraumatic.  Eyes:     Conjunctiva/sclera: Conjunctivae normal.  Cardiovascular:     Rate and Rhythm: Normal rate and regular rhythm.     Heart sounds: Normal heart sounds.  Pulmonary:     Effort: Pulmonary effort is normal.     Breath sounds: Normal breath sounds.  Abdominal:     General: Bowel sounds are normal.     Palpations: Abdomen is soft.  Musculoskeletal:     Cervical back: Normal range of motion.  Skin:    General: Skin is warm and dry.     Capillary Refill: Capillary refill takes less than 2 seconds.  Neurological:     Mental Status: She is alert and oriented to person, place, and time.  Psychiatric:        Behavior: Behavior normal.      Musculoskeletal Exam: C-spine, thoracic spine, lumbar spine have good range of motion.  No midline spinal tenderness or SI joint tenderness.  Shoulder joints, elbow joints, wrist joints, MCPs, PIPs, DIPs have good range of motion with no synovitis.  Complete fist formation bilaterally.  Hip joints have good range of motion with no groin pain.  No tenderness over trochanteric bursa bilaterally.  Knee joints have good range of motion with no warmth or effusion.  Ankle joints have good range of motion with no tenderness or joint  swelling.  No tenderness or synovitis over MTP joints.  Right fourth hammertoe noted.  CDAI Exam: CDAI Score: -- Patient Global: 3 mm; Provider Global: 3 mm Swollen: --; Tender: -- Joint Exam 05/04/2022   No joint exam has been documented for this visit   There is currently no information documented on the homunculus. Go to the Rheumatology activity and complete the homunculus joint exam.  Investigation: No additional findings.  Imaging: No results found.  Recent Labs: Lab Results  Component Value Date   WBC 8.0 04/18/2022   HGB 13.0 04/18/2022   PLT 245 04/18/2022   NA 140 04/18/2022   K 4.4 04/18/2022   CL 103 04/18/2022   CO2 27 04/18/2022   GLUCOSE 81 04/18/2022   BUN 16 04/18/2022   CREATININE 1.17 (H) 04/18/2022   BILITOT 0.4  04/18/2022   ALKPHOS 91 11/18/2020   AST 23 04/18/2022   ALT 22 04/18/2022   PROT 6.8 04/18/2022   ALBUMIN 4.2 11/18/2020   CALCIUM 9.5 04/18/2022   GFRAA 61 11/02/2020   QFTBGOLD Negative 09/14/2015   QFTBGOLDPLUS NEGATIVE 04/18/2022    Speciality Comments: Remicade '6mg'$ /kg every 6 weeks 07/13-09/21 Rinvoq 15 mg qd 04/28/20 MTX - dcd due to increase in [place] started 06/21/20-12/22- increase in cr   Procedures:  No procedures performed Allergies: Patient has no known allergies.    Assessment / Plan:     Visit Diagnoses: Rheumatoid arthritis of multiple sites with negative rheumatoid factor ([place]): She has no joint tenderness or synovitis on examination today.  She has not had any signs or symptoms of a rheumatoid arthritis flare.  Her morning stiffness has been lasting about 5 minutes daily.  She has not had any nocturnal pain or difficulty with ADLs.  Her rheumatoid arthritis remains well controlled taking Rinvoq 15 mg 1 tablet by mouth daily.  She is tolerating Rinvoq without any side effects and has not missed any doses recently.  She has not had any recent or recurrent infections.  She will remain on Rinvoq as prescribed.  She  was advised to notify us if she develops increased joint pain or joint swelling.  She will follow-up in the office in 5 months or sooner if needed.  Association of heart disease with rheumatoid arthritis was discussed. Need to monitor blood pressure, cholesterol, and to exercise 30-60 minutes on daily basis was discussed.  High risk medication use - Rinvoq 15 mg 1 tablet by mouth daily (started 04/28/20).  Previous therapy: Remicade, Arava, MTX  CBC and CMP updated on 04/18/22.  Advised to avoid the use of NSAIDs.  Her next lab work will be due in January and every 3 months.  Standing orders for CBC and CMP remain in place. TB gold negative on 04/18/22.  Lipid panel within normal limits on 10/03/2021. Blood pressure was 120/74 today in the office. She has not had any recent or recurrent infections.  Discussed the importance of holding rinvoq if she develops signs or symptoms of an infection and to resume once the infection has completely cleared.  She received the annual flu shot and COVID-19 booster earlier this month. Counseled on the increase risk of venous thrombosis. Counseled about FDA black box warning of MACE (major adverse CV events including cardiovascular death, myocardial infarction, and stroke).  Reviewed with patient that there is the possibility of an increased risk of malignancy specifically lung cancer and lymphomas but it is not well understood if this increased risk is due to the medication or the disease state.   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  She uses XyliMelts over-the-counter as needed for dry mouth relief.  Chronic pain of both knees: She has good range of motion of both knee joints on examination today.  No warmth or effusion noted.  Fibromyalgia - She has not had any recent fibromyalgia flares.  She had no generalized hyperalgesia or positive tender joints on examination today.  She remains on Cymbalta 60  mg 1 capsule daily.  Chronic fatigue: Discussed the importance of regular exercise and good sleep hygiene.  Primary insomnia: No nocturnal pain.   Anxiety and depression: She remains on Cymbalta as prescribed.  Other medical conditions are listed as follows:  History of gastroesophageal reflux (GERD)  History of COPD - She was evaluated  by Dr. Vaughan Browner on 09/23/20.   Orders: No orders of the defined types were placed in this encounter.  No orders of the defined types were placed in this encounter.    Follow-Up Instructions: Return in about 5 months (around 10/03/2022) for Rheumatoid arthritis, Fibromyalgia.   Ofilia Neas, PA-C  Note - This record has been created using Dragon software.  Chart creation errors have been sought, but may not always  have been located. Such creation errors do not reflect on  the standard of medical care.

## 2022-04-22 LAB — COMPLETE METABOLIC PANEL WITH GFR
AG Ratio: 1.6 (calc) (ref 1.0–2.5)
ALT: 22 U/L (ref 6–29)
AST: 23 U/L (ref 10–35)
Albumin: 4.2 g/dL (ref 3.6–5.1)
Alkaline phosphatase (APISO): 86 U/L (ref 37–153)
BUN/Creatinine Ratio: 14 (calc) (ref 6–22)
BUN: 16 mg/dL (ref 7–25)
CO2: 27 mmol/L (ref 20–32)
Calcium: 9.5 mg/dL (ref 8.6–10.4)
Chloride: 103 mmol/L (ref 98–110)
Creat: 1.17 mg/dL — ABNORMAL HIGH (ref 0.50–1.05)
Globulin: 2.6 g/dL (calc) (ref 1.9–3.7)
Glucose, Bld: 81 mg/dL (ref 65–99)
Potassium: 4.4 mmol/L (ref 3.5–5.3)
Sodium: 140 mmol/L (ref 135–146)
Total Bilirubin: 0.4 mg/dL (ref 0.2–1.2)
Total Protein: 6.8 g/dL (ref 6.1–8.1)
eGFR: 52 mL/min/{1.73_m2} — ABNORMAL LOW (ref >=60)

## 2022-04-22 LAB — CBC WITH DIFFERENTIAL/PLATELET
Absolute Monocytes: 728 cells/uL (ref 200–950)
Basophils Absolute: 80 cells/uL (ref 0–200)
Basophils Relative: 1 %
Eosinophils Absolute: 112 cells/uL (ref 15–500)
Eosinophils Relative: 1.4 %
HCT: 38.7 % (ref 35.0–45.0)
Hemoglobin: 13 g/dL (ref 11.7–15.5)
Lymphs Abs: 3256 cells/uL (ref 850–3900)
MCH: 29.7 pg (ref 27.0–33.0)
MCHC: 33.6 g/dL (ref 32.0–36.0)
MCV: 88.6 fL (ref 80.0–100.0)
MPV: 11.6 fL (ref 7.5–12.5)
Monocytes Relative: 9.1 %
Neutro Abs: 3824 cells/uL (ref 1500–7800)
Neutrophils Relative %: 47.8 %
Platelets: 245 10*3/uL (ref 140–400)
RBC: 4.37 10*6/uL (ref 3.80–5.10)
RDW: 12.8 % (ref 11.0–15.0)
Total Lymphocyte: 40.7 %
WBC: 8 10*3/uL (ref 3.8–10.8)

## 2022-04-22 LAB — QUANTIFERON-TB GOLD PLUS
Mitogen-NIL: >10 IU/mL
NIL: 0.07 IU/mL
QuantiFERON-TB Gold Plus: NEGATIVE
TB1-NIL: <0 IU/mL
TB2-NIL: 0 IU/mL

## 2022-04-23 NOTE — Progress Notes (Signed)
TB Gold negative

## 2022-04-24 ENCOUNTER — Other Ambulatory Visit: Payer: Self-pay | Admitting: Family Medicine

## 2022-05-03 ENCOUNTER — Other Ambulatory Visit: Payer: Self-pay | Admitting: Rheumatology

## 2022-05-03 NOTE — Telephone Encounter (Signed)
Next Visit: 05/04/2022   Last Visit: 12/29/2021   Last Fill: 12/28/2021  KT:[id number] arthritis of multiple sites with negative rheumatoid factor   Current Dose per office note on 12/29/2021: Rinvoq 15 mg 1 tablet by mouth daily  Labs: 04/18/2022 CBC WNL.  Creatinine is elevated-1.17 and GFR is low-52 .  TB Gold: 04/18/2022 Neg   Okay to refill Rinvoq?

## 2022-05-04 ENCOUNTER — Encounter: Payer: Self-pay | Admitting: Physician Assistant

## 2022-05-04 ENCOUNTER — Ambulatory Visit: Payer: 59 | Attending: Physician Assistant | Admitting: Physician Assistant

## 2022-05-04 VITALS — BP 120/74 | HR 66 | Resp 17 | Ht 66.0 in | Wt 204.6 lb

## 2022-05-04 DIAGNOSIS — R5382 Chronic fatigue, unspecified: Secondary | ICD-10-CM

## 2022-05-04 DIAGNOSIS — F5101 Primary insomnia: Secondary | ICD-10-CM

## 2022-05-04 DIAGNOSIS — G8929 Other chronic pain: Secondary | ICD-10-CM

## 2022-05-04 DIAGNOSIS — M3500 Sicca syndrome, unspecified: Secondary | ICD-10-CM

## 2022-05-04 DIAGNOSIS — F32A Depression, unspecified: Secondary | ICD-10-CM

## 2022-05-04 DIAGNOSIS — Z8719 Personal history of other diseases of the digestive system: Secondary | ICD-10-CM

## 2022-05-04 DIAGNOSIS — Z8709 Personal history of other diseases of the respiratory system: Secondary | ICD-10-CM

## 2022-05-04 DIAGNOSIS — Z79899 Other long term (current) drug therapy: Secondary | ICD-10-CM

## 2022-05-04 DIAGNOSIS — M0609 Rheumatoid arthritis without rheumatoid factor, multiple sites: Secondary | ICD-10-CM

## 2022-05-04 DIAGNOSIS — R21 Rash and other nonspecific skin eruption: Secondary | ICD-10-CM

## 2022-05-04 DIAGNOSIS — F419 Anxiety disorder, unspecified: Secondary | ICD-10-CM

## 2022-05-04 DIAGNOSIS — M25561 Pain in right knee: Secondary | ICD-10-CM | POA: Diagnosis not present

## 2022-05-04 DIAGNOSIS — M25562 Pain in left knee: Secondary | ICD-10-CM

## 2022-05-04 DIAGNOSIS — M797 Fibromyalgia: Secondary | ICD-10-CM

## 2022-05-04 NOTE — Patient Instructions (Signed)
Standing Labs We placed an order today for your standing lab work.   Please have your standing labs drawn in January and every 3 months   Please have your labs drawn 2 weeks prior to your appointment so that the provider can discuss your lab results at your appointment.  Please note that you may see your imaging and lab results in MyChart before we have reviewed them. We will contact you once all results are reviewed. Please allow our office up to 72 hours to thoroughly review all of the results before contacting the office for clarification of your results.  Lab hours are:   Monday through Thursday from 8:00 am -12:30 pm and 1:00 pm-5:00 pm and Friday from 8:00 am-12:00 pm.  Please be advised, all patients with office appointments requiring lab work will take precedent over walk-in lab work.   Labs are drawn by [facility]. Please bring your co-pay at the time of your lab draw.  You may receive a bill from [facility] for your lab work.  Please note if you are on Hydroxychloroquine and and an order has been placed for a Hydroxychloroquine level, you will need to have it drawn 4 hours or more after your last dose.  If you wish to have your labs drawn at another location, please call the office 24 hours in advance so we can fax the orders.  The office is located at [address], [address], [place], [place] [address] No appointment is necessary.    If you have any questions regarding directions or hours of operation,  please call [phone].   As a reminder, please drink plenty of water prior to coming for your lab work. Thanks!  

## 2022-05-23 ENCOUNTER — Encounter: Payer: Self-pay | Admitting: *Deleted

## 2022-05-27 ENCOUNTER — Other Ambulatory Visit: Payer: Self-pay | Admitting: Family Medicine

## 2022-06-07 ENCOUNTER — Other Ambulatory Visit: Payer: Self-pay | Admitting: *Deleted

## 2022-06-07 DIAGNOSIS — Z87891 Personal history of nicotine dependence: Secondary | ICD-10-CM

## 2022-06-07 DIAGNOSIS — Z122 Encounter for screening for malignant neoplasm of respiratory organs: Secondary | ICD-10-CM

## 2022-06-22 ENCOUNTER — Other Ambulatory Visit: Payer: Self-pay | Admitting: Physician Assistant

## 2022-06-22 NOTE — Telephone Encounter (Signed)
Next Visit: 10/03/2022  Last Visit: 05/04/2022  Last Fill: 03/22/2022  Dx: Fibromyalgia   Current Dose per office note on 05/04/2022: Cymbalta 60 mg 1 capsule daily.   Okay to refill Cymbalta?

## 2022-06-28 ENCOUNTER — Ambulatory Visit
Admission: RE | Admit: 2022-06-28 | Discharge: 2022-06-28 | Disposition: A | Discharge status: Home / Self Care | Payer: 59 | Source: Ambulatory Visit | Attending: Acute Care | Admitting: Acute Care

## 2022-06-28 DIAGNOSIS — Z122 Encounter for screening for malignant neoplasm of respiratory organs: Secondary | ICD-10-CM

## 2022-06-28 DIAGNOSIS — Z87891 Personal history of nicotine dependence: Secondary | ICD-10-CM

## 2022-07-01 ENCOUNTER — Other Ambulatory Visit: Payer: Self-pay | Admitting: Cardiology

## 2022-07-04 ENCOUNTER — Other Ambulatory Visit: Payer: Self-pay | Admitting: Acute Care

## 2022-07-04 DIAGNOSIS — Z122 Encounter for screening for malignant neoplasm of respiratory organs: Secondary | ICD-10-CM

## 2022-07-04 DIAGNOSIS — Z87891 Personal history of nicotine dependence: Secondary | ICD-10-CM

## 2022-07-04 NOTE — Telephone Encounter (Signed)
Rx request sent to pharmacy.  

## 2022-07-10 ENCOUNTER — Ambulatory Visit: Payer: 59 | Admitting: Pulmonary Disease

## 2022-07-10 ENCOUNTER — Encounter: Payer: Self-pay | Admitting: Pulmonary Disease

## 2022-07-10 VITALS — BP 118/62 | HR 71 | Temp 97.8°F | Ht 65.0 in | Wt 199.0 lb

## 2022-07-10 DIAGNOSIS — J439 Emphysema, unspecified: Secondary | ICD-10-CM

## 2022-07-10 DIAGNOSIS — J4489 Other specified chronic obstructive pulmonary disease: Secondary | ICD-10-CM | POA: Diagnosis not present

## 2022-07-10 NOTE — Patient Instructions (Signed)
I am glad you are doing well with your breathing Continue inhalers and annual CT scan Follow-up in 1 year.

## 2022-07-10 NOTE — Progress Notes (Signed)
Courtney Myers    [id number]    Feb 07, 1961  Primary Care Physician:Pickard, Cammie Mcgee, MD  Referring Physician: Susy Frizzle, MD [address] [place],  [place] [address]  Chief complaint: Follow-up for COPD, chest pain  HPI: 62 y.o.  active smoker fibromyalgia, GERD, rheumatoid arthritis She is followed for seronegative arthritis by Dr. Estanislado Pandy and is well controlled on Rinvoq and [place].  Complains of recurrent reproducible chest pain since November 2021.  She is seen in the ER in November 2021 and March 2022 for similar complaints and this was thought to be secondary to costochondritis She had a CT angiogram in December 2022 which did not show any PE.  There are some subtle changes suggestive of interstitial lung disease.  She has been recently started on prednisone with improvement in symptoms.  However she is having insomnia and mood changes from the steroids  Reports intermittent episodes of sternal and back pain, chest tightness.  Denies any cough, sputum production  Pets: 2 dogs Occupation: Web designer Exposures: No mold, hot tub, Jacuzzi.  No feather pillows or comforters Smoking history: 40-pack-year smoker.  Quit in Nov 2022 Travel history: No significant travel history Relevant family history: No family history of lung disease  Interim history: Finally quit smoking in Nov 2022 She is now off chronic prednisone for fibromyalgia pain Breathing is stable on Trelegy   Outpatient Encounter Medications as of 07/10/2022  Medication Sig   AbobotulinumtoxinA (DYSPORT) 500 units SOLR injection INJECT 500 UNITS  INTRAMUSCULARLY EVERY 3  MONTHS (GIVEN AT MD OFFICE, DISCARD UNUSED)   aspirin EC 81 MG tablet Take 1 tablet (81 mg total) by mouth daily. Swallow whole.   clonazePAM (KLONOPIN) 0.5 MG tablet TAKE 1 TABLET BY MOUTH TWICE DAILY AS NEEDED   DULoxetine (CYMBALTA) 60 MG capsule TAKE 1 CAPSULE(60 MG) BY MOUTH DAILY   metoprolol  succinate (TOPROL-XL) 25 MG 24 hr tablet Take 1 tablet (25 mg total) by mouth daily. Take with or immediately following a meal.   Multiple Vitamins-Minerals (MULTIVITAMINS THER. W/MINERALS) TABS Take 1 tablet by mouth at bedtime.    omeprazole (PRILOSEC) 20 MG capsule TAKE 1 CAPSULE BY MOUTH  DAILY   RINVOQ 15 MG TB24 TAKE 1 TABLET BY MOUTH ONCE  DAILY   rosuvastatin (CRESTOR) 20 MG tablet TAKE 1 TABLET(20 MG) BY MOUTH DAILY   traMADol (ULTRAM) 50 MG tablet Take 1 - 2 every 8 hours as needed for pain.   TRELEGY ELLIPTA 200-62.5-25 MCG/INH AEPB INHALE 1 INHALATION BY  MOUTH INTO THE LUNGS DAILY   Xylitol (XYLIMELTS MT) Use as directed 2 tablets in the mouth or throat at bedtime as needed (dry mouth).    [DISCONTINUED] clindamycin (CLEOCIN-T) 1 % lotion Apply topically 2 (two) times daily. (Patient not taking: Reported on 05/04/2022)   [DISCONTINUED] CRANBERRY PO Take by mouth daily.   [DISCONTINUED] doxycycline (VIBRA-TABS) 100 MG tablet Take 1 tablet (100 mg total) by mouth 2 (two) times daily. (Patient not taking: Reported on 05/04/2022)   Facility-Administered Encounter Medications as of 07/10/2022  Medication   0.9 %  sodium chloride infusion   Physical Exam: Blood pressure 118/62, pulse 71, temperature 97.8 F (36.6 C), temperature source Oral, height '5\' 5"'$  (1.651 m), weight 199 lb (90.3 kg), SpO2 97 %. Gen:      No acute distress HEENT:  EOMI, sclera anicteric Neck:     No masses; no thyromegaly Lungs:    Clear  to auscultation bilaterally; normal respiratory effort CV:         Regular rate and rhythm; no murmurs Abd:      + bowel sounds; soft, non-tender; no palpable masses, no distension Ext:    No edema; adequate peripheral perfusion Skin:      Warm and dry; no rash Neuro: alert and oriented x 3 Psych: normal mood and affect   Data Reviewed: Imaging: CTA 06/16/2020-no pulmonary embolism, patchy peripheral opacities consistent with chronic interstitial lung disease, stable right  lung nodules, emphysema.   High-res CT 09/29/2020- mild centrilobular nodularity in the upper lobes, 1 vessel coronary atherosclerosis, thyroid nodule, emphysema.  Screening CT chest 06/28/2022- stable pulmonary nodules, mild emphysema, coronary atherosclerosis  I have reviewed the images personally  PFTs: 11/16/2020 FVC 2.58 [73%], FEV1 1.61 [59%], F/F 63, TLC 4.51 [84%], DLCO 15.23 [90%] Moderate to severe obstruction, mild diffusion defect  Labs: CBC 08/02/2020-WBC 9.8, eos 0.4%, absolute eosinophil count 39 CBC 11/02/2020-WBC 11.5, eos 1.8%, absolute eosinophil count 207  Assessment:  COPD PFTs reviewed with moderate-severe obstruction.  Currently on Trelegy with good control of symptoms  Rheumatoid arthritis No evidence of ILD on recent imaging Continues on [place] and [place] with Dr. Estanislado Pandy.  Plan/Recommendations: Continue Trelegy, annual CT scans  Follow-up in 1 year  Marshell Garfinkel MD [facility] 07/10/2022, 9:50 AM  CC: Susy Frizzle, MD

## 2022-07-13 ENCOUNTER — Ambulatory Visit: Payer: 59 | Admitting: Family Medicine

## 2022-07-13 ENCOUNTER — Encounter: Payer: Self-pay | Admitting: Family Medicine

## 2022-07-13 VITALS — BP 120/76 | HR 72 | Ht 65.0 in | Wt 199.0 lb

## 2022-07-13 DIAGNOSIS — E7800 Pure hypercholesterolemia, unspecified: Secondary | ICD-10-CM

## 2022-07-13 NOTE — Progress Notes (Signed)
Subjective:    Patient ID: Courtney Myers, female    DOB: [birth year], 62 y.o.   MRN: [id number]  HPI  Patient is a former smoker who recently had a CT scan of the chest to screen for lung cancer.  There was a coincidental finding of atherosclerosis in the left anterior descending coronary artery.  Last year we checked her cholesterol her LDL cholesterol is 47.  She is on Crestor 20 mg a day and she continues to refrain from smoking.  She denies any angina.  She does have some mild shortness of breath with activity but denies any orthopnea or paroxysmal nocturnal dyspnea Past Medical History:  Diagnosis Date   Anxiety    Cervical dystonia    COPD (chronic obstructive pulmonary disease) ([facility]) 10/2020   Depression    Fibromyalgia    GERD (gastroesophageal reflux disease)    History of shingles    RA (rheumatoid arthritis) ([facility])    Synovitis of hand    Vertigo    Past Surgical History:  Procedure Laterality Date   COLONOSCOPY  10/25/2009   TONSILLECTOMY     VAGINAL HYSTERECTOMY  09/01/2006   Current Outpatient Medications on File Prior to Visit  Medication Sig Dispense Refill   AbobotulinumtoxinA (DYSPORT) 500 units SOLR injection INJECT 500 UNITS  INTRAMUSCULARLY EVERY 3  MONTHS (GIVEN AT MD OFFICE, DISCARD UNUSED) 1 each 3   aspirin EC 81 MG tablet Take 1 tablet (81 mg total) by mouth daily. Swallow whole. 90 tablet 3   clonazePAM (KLONOPIN) 0.5 MG tablet TAKE 1 TABLET BY MOUTH TWICE DAILY AS NEEDED 60 tablet 1   DULoxetine (CYMBALTA) 60 MG capsule TAKE 1 CAPSULE(60 MG) BY MOUTH DAILY 30 capsule 2   metoprolol succinate (TOPROL-XL) 25 MG 24 hr tablet Take 1 tablet (25 mg total) by mouth daily. Take with or immediately following a meal. 30 tablet 11   Multiple Vitamins-Minerals (MULTIVITAMINS THER. W/MINERALS) TABS Take 1 tablet by mouth at bedtime.      omeprazole (PRILOSEC) 20 MG capsule TAKE 1 CAPSULE BY MOUTH  DAILY 90 capsule 3   RINVOQ 15 MG TB24 TAKE 1 TABLET BY MOUTH  ONCE  DAILY 30 tablet 2   rosuvastatin (CRESTOR) 20 MG tablet TAKE 1 TABLET(20 MG) BY MOUTH DAILY 90 tablet 1   traMADol (ULTRAM) 50 MG tablet Take 1 - 2 every 8 hours as needed for pain. 30 tablet 0   TRELEGY ELLIPTA 200-62.5-25 MCG/INH AEPB INHALE 1 INHALATION BY  MOUTH INTO THE LUNGS DAILY 180 each 3   Xylitol (XYLIMELTS MT) Use as directed 2 tablets in the mouth or throat at bedtime as needed (dry mouth).      Current Facility-Administered Medications on File Prior to Visit  Medication Dose Route Frequency Provider Last Rate Last Admin   0.9 %  sodium chloride infusion  500 mL Intravenous Once Nandigam, Venia Minks, MD       No Known Allergies Social History   Socioeconomic History   Marital status: Married    Spouse name: [name]   Number of children: 2   Years of education: 12   Highest education level: Not on file  Occupational History    Employer: [employer]  Tobacco Use   Smoking status: Former    Packs/day: 0.25    Years: 34.00    Total pack years: 8.50    Types: Cigarettes    Start date: 1979    Quit date: 11/16/2020    Years since quitting:  1.6    Passive exposure: Never   Smokeless tobacco: Never  Vaping Use   Vaping Use: Never used  Substance and Sexual Activity   Alcohol use: Yes    Alcohol/week: 0.0 standard drinks of alcohol    Comment: Rare, twice per year   Drug use: Never   Sexual activity: Yes    Birth control/protection: Surgical    Comment: 1st intercourse 62 yo-Fewer than 5 partners  Other Topics Concern   Not on file  Social History Narrative   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 Right handed.    Caffeine- 4 cups daily.   Social Determinants of Health   Financial Resource Strain: Not on file  Food Insecurity: Not on file  Transportation Needs: Not on file  Physical Activity: Not on file  Stress: Not on file  Social Connections: Not on file  Intimate  Partner Violence: Not on file    The patient smokes.  She has a history of coronary artery disease.  Her father had numerous stents and bypasses starting in his 88s.  She had a paternal uncle who had stents in his 28s.  Her paternal grandfather also had stents and bypasses.  Therefore coronary artery disease is strong and the female members on her father side of the family usually in the late 4s or early 61s.   Review of Systems  All other systems reviewed and are negative.      Objective:   Physical Exam Vitals reviewed.  Constitutional:      General: She is not in acute distress.    Appearance: Normal appearance. She is obese. She is not ill-appearing or toxic-appearing.  HENT:     Head: Normocephalic and atraumatic.     Right Ear: Tympanic membrane and ear canal normal.     Left Ear: Tympanic membrane and ear canal normal.     Nose: Nose normal. No congestion or rhinorrhea.     Mouth/Throat:     Mouth: Mucous membranes are moist.     Pharynx: Oropharynx is clear. No oropharyngeal exudate or posterior oropharyngeal erythema.  Eyes:     Extraocular Movements: Extraocular movements intact.     Conjunctiva/sclera: Conjunctivae normal.     Pupils: Pupils are equal, round, and reactive to light.  Neck:     Vascular: No carotid bruit.  Cardiovascular:     Rate and Rhythm: Normal rate and regular rhythm.     Pulses: Normal pulses.     Heart sounds: Normal heart sounds. No murmur heard.    No friction rub. No gallop.  Pulmonary:     Effort: Pulmonary effort is normal. No respiratory distress.     Breath sounds: Normal breath sounds. No stridor. No wheezing, rhonchi or rales.  Chest:     Chest wall: No tenderness or crepitus.  Abdominal:     General: Abdomen is flat. Bowel sounds are normal. There is no distension.     Palpations: Abdomen is soft.     Tenderness: There is no abdominal tenderness. There is no guarding or rebound.     Hernia: No hernia is present.  Musculoskeletal:         General: No swelling, tenderness, deformity or signs of injury.     Cervical back: Normal range of motion and neck supple. No rigidity.     Right lower leg: No edema.     Left lower leg: No edema.  Lymphadenopathy:  Cervical: No cervical adenopathy.  Skin:    General: Skin is warm.     Coloration: Skin is not jaundiced.     Findings: No bruising, erythema or lesion.  Neurological:     General: No focal deficit present.     Mental Status: She is alert and oriented to person, place, and time. Mental status is at baseline.     Cranial Nerves: No cranial nerve deficit.     Sensory: No sensory deficit.     Motor: No weakness.     Coordination: Coordination normal.     Gait: Gait normal.     Deep Tendon Reflexes: Reflexes normal.  Psychiatric:        Mood and Affect: Mood normal.        Behavior: Behavior normal.        Thought Content: Thought content normal.        Judgment: Judgment normal.           Assessment & Plan:  Pure hypercholesterolemia - Plan: COMPLETE METABOLIC PANEL WITH GFR, Lipid panel Patient is asymptomatic.  Therefore I do not feel that this represents a flow-limiting lesion in the left anterior descending artery.  Instead I would recommend risk factor control to keep her LDL cholesterol less than 55.  Continue to avoid smoking.  Continue to control blood pressure.  Patient plans to follow with her cardiologist and will discuss the situation with them as well however I feel reassured by her lack of symptoms

## 2022-07-14 LAB — COMPLETE METABOLIC PANEL WITH GFR
AG Ratio: 1.6 (calc) (ref 1.0–2.5)
ALT: 20 U/L (ref 6–29)
AST: 21 U/L (ref 10–35)
Albumin: 4.4 g/dL (ref 3.6–5.1)
Alkaline phosphatase (APISO): 71 U/L (ref 37–153)
BUN: 18 mg/dL (ref 7–25)
CO2: 26 mmol/L (ref 20–32)
Calcium: 9.5 mg/dL (ref 8.6–10.4)
Chloride: 103 mmol/L (ref 98–110)
Creat: 0.99 mg/dL (ref 0.50–1.05)
Globulin: 2.7 g/dL (calc) (ref 1.9–3.7)
Glucose, Bld: 102 mg/dL — ABNORMAL HIGH (ref 65–99)
Potassium: 4.2 mmol/L (ref 3.5–5.3)
Sodium: 138 mmol/L (ref 135–146)
Total Bilirubin: 0.4 mg/dL (ref 0.2–1.2)
Total Protein: 7.1 g/dL (ref 6.1–8.1)
eGFR: 64 mL/min/{1.73_m2} (ref >=60)

## 2022-07-14 LAB — LIPID PANEL
Cholesterol: 138 mg/dL (ref <200)
HDL: 67 mg/dL (ref >=50)
LDL Cholesterol (Calc): 54 mg/dL (calc)
Non-HDL Cholesterol (Calc): 71 mg/dL (calc) (ref <130)
Total CHOL/HDL Ratio: 2.1 (calc) (ref <5.0)
Triglycerides: 90 mg/dL (ref <150)

## 2022-07-19 ENCOUNTER — Other Ambulatory Visit: Payer: Self-pay | Admitting: Family Medicine

## 2022-07-19 DIAGNOSIS — K219 Gastro-esophageal reflux disease without esophagitis: Secondary | ICD-10-CM

## 2022-07-31 ENCOUNTER — Other Ambulatory Visit: Payer: Self-pay | Admitting: Family Medicine

## 2022-08-05 ENCOUNTER — Other Ambulatory Visit: Payer: Self-pay | Admitting: Rheumatology

## 2022-08-05 DIAGNOSIS — Z79899 Other long term (current) drug therapy: Secondary | ICD-10-CM

## 2022-08-07 NOTE — Telephone Encounter (Addendum)
Next Visit: 10/03/2022  Last Visit: 05/04/2022  Last Fill: 05/03/2022  ER:[id number] arthritis of multiple sites with negative rheumatoid factor   Current Dose per office note on 05/04/2022: Rinvoq 15 mg 1 tablet by mouth daily   Labs: 07/13/2022 CMP: glucose 102  TB Gold: 04/18/2022 negative    Attempted to contact patient and left message to advise patient she is due to update CBC.   Okay to refill rinvoq?

## 2022-08-07 NOTE — Addendum Note (Signed)
Addended by: Earnestine Mealing on: 08/07/2022 08:14 AM   Modules accepted: Orders

## 2022-08-09 ENCOUNTER — Encounter: Payer: Self-pay | Admitting: Pulmonary Disease

## 2022-08-09 ENCOUNTER — Telehealth: Payer: Self-pay | Admitting: Neurology

## 2022-08-09 MED ORDER — TRELEGY ELLIPTA 200-62.5-25 MCG/ACT IN AEPB: 1.0000 | INHALATION_SPRAY | Freq: Every day | RESPIRATORY_TRACT | 3 refills | Status: DC

## 2022-08-15 NOTE — Telephone Encounter (Signed)
error 

## 2022-09-04 ENCOUNTER — Ambulatory Visit (HOSPITAL_BASED_OUTPATIENT_CLINIC_OR_DEPARTMENT_OTHER): Payer: 59 | Admitting: Cardiology

## 2022-09-05 ENCOUNTER — Other Ambulatory Visit: Payer: Self-pay | Admitting: Physician Assistant

## 2022-09-05 NOTE — Telephone Encounter (Signed)
Next Visit: 10/03/2022  Last Visit: 05/04/2022  Last Fill: 06/22/2022  Dx: Fibromyalgia    Current Dose per office note on 05/04/2022: Cymbalta 60 mg 1 capsule daily.    Okay to refill Cymbalta?

## 2022-09-13 ENCOUNTER — Ambulatory Visit: Payer: 59 | Admitting: Neurology

## 2022-09-13 ENCOUNTER — Encounter: Payer: Self-pay | Admitting: Neurology

## 2022-09-13 VITALS — BP 131/80 | HR 77 | Ht 65.0 in | Wt 199.0 lb

## 2022-09-13 DIAGNOSIS — G243 Spasmodic torticollis: Secondary | ICD-10-CM

## 2022-09-13 MED ORDER — ABOBOTULINUMTOXINA 500 UNITS IM SOLR
500.0000 [IU] | Freq: Once | INTRAMUSCULAR | Status: AC
  Administered 2022-09-13: 500 [IU] via INTRAMUSCULAR

## 2022-09-13 NOTE — Progress Notes (Signed)
Dysport 500 units x 1 vial [id number] [id number] Exp-03/02/2024 B/B 

## 2022-09-13 NOTE — Progress Notes (Signed)
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  Symptom onset was without apparent triggering event, she has gradually developed this neck pulling,  manifested primarily with right laterocollis, mild retrocolis, and left shoulder elevation since 2000. She has good relief with regular Botox injection, about 4 times a year since 2003. Last injection was February 18, 2010 by Dr. Marta Antu,  She reported great relief as usual, taking 4- 5 days to get symptom relieve, lasting about 3 months.  I began to inject her since 12.2011, every 3-4 months, dosage has been limited to 150 units of BOTOX A.  She denies neck pain, gait difficulty, but with wearing off Botox, she noticed increased head tremor, and pulling towards her right shoulder.  She developed  transient difficulty lifting her neck up when using BOTOX A 200 units previously, reponding well to decreased dose of 150 units  She was enrolled into IPSEN dysport study, first injection was in April 30th 2013, she later enrolled into open label, 2nd injection was in  May 28th 2013. She did very well.  Recent few weeks,  She had experienced flareup of her rheumatoid arthritis, has received IV infusion. Last injection was in 06/10/2012,  500 units of dysport was dissolved into 2 cc of normal saline. Right longissimus capitus 0.5 cc Right splenius capitis 0.5 cc Right levator scapular 0.5 cc Right iliocostalis 0.5 cc   Dysport research study has completed, she is now coming back for repeat injection, she noticed head bobbing over the past few weeks, she denies significant neck pain, weakness,  Her rheumatoid arthritis is under better control   Last injection was in Sep 2014, she did very well, no neck extension weakness, only rarely neck shaking  UPDATE March 8th 2016: She has lost follow-up since her last  visit December 2014, She came in for treatment her cervical dystonia, she feel the tension of her neck all the time, stiffness. Denied gait difficulty, last injection was September 2014, she received 500 units of Dysport, works well for her, she is continue on medication for anxiety, rheumatoid arthritis She also complains of nighttime snoring, frequent awakening, excessive daytime sleepiness, fatigue, today's ESS is 6, FSS was 73,  UPDATE October 13 2014:   She has missed her scheduled sleep study, continue have worsening neck posturing, posterior neck pain, excessive daytime fatigue and sleepiness  UPDATE Nov 11 2014: She came in for EMG guided Dysport injection today, last injection was December 2014, she complains of worsening head titubation, bilateral hands mild postural tremor, posterior neck pain,  Update February 17 2015 She responded very well to last EMG guided Dysport injection Nov 11 2014, no significant head titubation, no significant neck pain  Update July 22 2015: Last injection was August, now she noticed returning of neck pulling and shaking, posterior neck muscle achy pain  Update October 21 2015: Last EMG guided Dysport injection was in January, she responded very well, only recent couple weeks, she noticed returning of neck pulling, shaking again, She has worsening rheumatoid arthritis, is receiving more frequent treatment, complains fatigue, diffuse body achy pain  Update March 15 2016: She responded very well to previous Dysport injection in April, no significant side effect noticed, only recent few weeks, 5 months from previous injection, she noticed return of head shaking neck muscle tightness.  UPDATE Dec 14th 2017: She responded very well  to previous injection in Sept 2017, no significant side effect noticed.  Update September 13 2016: She did well this last injection in December  Update January 15 2017: She noticed worsening head tremor, she had excessive stress,  her sister passed away from lung cancer in April 2018,  UPDATE Apr 25 2017: She responded very well with previous injection, barely has noticeable head shaking.  Update August 01, 2017: She responded very well to previous dysport injection, only recently began to have recurrent head shaking  UPDATE December 19 2017: She responded well to previous injection  UPDATE Sept 25 2019: She responded very well to previous injection  Update February 2022 2023  She responded well to previous injection, her rheumatoid arthritis is under excellent control,  She reported sudden onset of acute vertigo on July 22, 2020 turning to the right side, nausea, unsteady gait at the beginning of the onset, over the past few weeks, her symptoms has improved, but with sudden positional change, especially turning towards the right side, it will trigger transient dizziness, she denies hearing loss.  REVIEW OF SYSTEMS: Full 14 system review of systems performed and notable only for ear pain, joint pain, joint swelling, achy muscles, muscle cramps, rash, tremors, depression, anxiety  ALLERGIES: No Known Allergies  HOME MEDICATIONS: Current Outpatient Medications  Medication Sig Dispense Refill   AbobotulinumtoxinA (DYSPORT) 500 units SOLR injection INJECT 500 UNITS  INTRAMUSCULARLY EVERY 3  MONTHS (GIVEN AT MD OFFICE, DISCARD UNUSED) 1 each 3   aspirin EC 81 MG tablet Take 1 tablet (81 mg total) by mouth daily. Swallow whole. 90 tablet 3   clonazePAM (KLONOPIN) 0.5 MG tablet TAKE 1 TABLET BY MOUTH TWICE DAILY AS NEEDED 60 tablet 2   DULoxetine (CYMBALTA) 60 MG capsule TAKE 1 CAPSULE(60 MG) BY MOUTH DAILY 30 capsule 2   Fluticasone-Umeclidin-Vilant (TRELEGY ELLIPTA) 200-62.5-25 MCG/ACT AEPB Inhale 1 puff into the lungs daily. 180 each 3   metoprolol succinate (TOPROL-XL) 25 MG 24 hr tablet Take 1 tablet (25 mg total) by mouth daily. Take with or immediately following a meal. 30 tablet 11   Multiple  Vitamins-Minerals (MULTIVITAMINS THER. W/MINERALS) TABS Take 1 tablet by mouth at bedtime.      omeprazole (PRILOSEC) 20 MG capsule TAKE 1 CAPSULE BY MOUTH DAILY 60 capsule 0   RINVOQ 15 MG TB24 TAKE 1 TABLET BY MOUTH DAILY 30 tablet 2   rosuvastatin (CRESTOR) 20 MG tablet TAKE 1 TABLET(20 MG) BY MOUTH DAILY 90 tablet 1   traMADol (ULTRAM) 50 MG tablet Take 1 - 2 every 8 hours as needed for pain. 30 tablet 0   Xylitol (XYLIMELTS MT) Use as directed 2 tablets in the mouth or throat at bedtime as needed (dry mouth).      Current Facility-Administered Medications  Medication Dose Route Frequency Provider Last Rate Last Admin   0.9 %  sodium chloride infusion  500 mL Intravenous Once Mauri Pole, MD        PAST MEDICAL HISTORY: Past Medical History:  Diagnosis Date   Anxiety    Cervical dystonia    COPD (chronic obstructive pulmonary disease) ([place]) 10/2020   Depression    Fibromyalgia    GERD (gastroesophageal reflux disease)    History of shingles    RA (rheumatoid arthritis) ([facility])    Synovitis of hand    Vertigo     PAST SURGICAL HISTORY: Past Surgical History:  Procedure Laterality Date   COLONOSCOPY  10/25/2009   TONSILLECTOMY  VAGINAL HYSTERECTOMY  09/01/2006    FAMILY HISTORY: Family History  Problem Relation Age of Onset   Arthritis Mother    Heart Problems Father    Heart disease Father    Heart disease Paternal Uncle        MI   Colon cancer Neg Hx    Colon polyps Neg Hx    Esophageal cancer Neg Hx    Rectal cancer Neg Hx    Stomach cancer Neg Hx     SOCIAL HISTORY:  Social History   Socioeconomic History   Marital status: Married    Spouse name: [name]   Number of children: 2   Years of education: 12   Highest education level: Not on file  Occupational History    Employer: [employer]  Tobacco Use   Smoking status: Former    Packs/day: 0.25    Years: 34.00    Total pack years: 8.50    Types: Cigarettes    Start date: 1979     Quit date: 11/16/2020    Years since quitting: 1.8    Passive exposure: Never   Smokeless tobacco: Never  Vaping Use   Vaping Use: Never used  Substance and Sexual Activity   Alcohol use: Yes    Alcohol/week: 0.0 standard drinks of alcohol    Comment: Rare, twice per year   Drug use: Never   Sexual activity: Yes    Birth control/protection: Surgical    Comment: 1st intercourse 62 yo-Fewer than 5 partners  Other Topics Concern   Not on file  Social History Narrative   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 Right handed.    Caffeine- 4 cups daily.   Social Determinants of Health   Financial Resource Strain: Not on file  Food Insecurity: Not on file  Transportation Needs: Not on file  Physical Activity: Not on file  Stress: Not on file  Social Connections: Not on file  Intimate Partner Violence: Not on file     PHYSICAL EXAM   There were no vitals filed for this visit.   Gen: NAD, conversant, well nourised, well groomed             NEUROLOGICAL EXAM:  MENTAL STATUS: Speech/cognition: Awake, alert, oriented to history taking and casual conversation   CRANIAL NERVES: She has mild retrocollis, right tilt, slight left shoulder elevation CN II: Visual fields are full to confrontation.  Pupils are round equal and briskly reactive to light. CN III, IV, VI: extraocular movement are normal. No ptosis. CN V: Facial sensation is intact to pinprick in all 3 divisions bilaterally. Corneal responses are intact.  CN VII: Face is symmetric with normal eye closure and smile. CN VIII: Hearing is normal to casual conversation CN IX, X: Palate elevates symmetrically. Phonation is normal. CN XI: Head turning and shoulder shrug are intact  MOTOR: There is no pronator drift of out-stretched arms. Muscle bulk and tone are normal. Muscle strength is normal.  COORDINATION: Rapid alternating movements and  fine finger movements are intact. There is no dysmetria on finger-to-nose and heel-knee-shin.    GAIT/STANCE: Posture is normal. Gait is steady    I performed Epley's maneuver, right ear dependent position after short latency will trigger rotatory downward beating nystagmus, habituate quickly, on the second repeat Epley's maneuver, the nystagmus and her vertigo sensation has much improved  DIAGNOSTIC DATA (LABS, IMAGING, TESTING) - I reviewed patient records, labs, notes,  testing and imaging myself where available.  ASSESSMENT AND PLAN  Davionna Crume is a 62 y.o. female with long-standing history of cervical dystonia, responded very well to previous EMG guided Dysport injection, last injection was May 2016, responded very well.   Mild retrocollis, right tilt, slight left shoulder elevation  500 of Dysport was dissolved into 2.5 cc of normal saline,  Right longissimus capitus 0. 5 cc Left splenius capitis 0.5 cc Right inferior oblique capitis, 0.5 cc  Left inferior oblique capitis 0.5 cc Left levator scapular 0.5   Benign positional vertigo  Symptom onset since July 22, 2021,  I performed Epley maneuver, right ear dependent position will trigger downward beating rotatory nystagmus, quickly improved after 2 repeat procedures   Marcial Pacas, M.D. Ph.D.  [facility] [address], [place], [place] [address] Ph: [phone] Fax: [phone]      PATIENT: Cariyah Ferdinand DOB: [birth year]  HISTORICAL  Sequoia Vilorio  is a 62 year-old right-handed Caucasian female, came in for EMG guided Botox for cervical dystonia.  Symptom onset was without apparent triggering event, she has gradually developed this neck pulling,  manifested primarily with right laterocollis, mild retrocolis, and left shoulder elevation since 2000. She has good relief with regular Botox injection, about 4 times a year since 2003. Last injection was February 18, 2010 by Dr. Marta Antu,  She reported great relief as usual, taking 4- 5 days to get symptom relieve, lasting about 3 months.  I began to inject her since 12.2011, every 3-4 months, dosage has been limited to 150 units of BOTOX A.  She denies neck pain, gait difficulty, but with wearing off Botox, she noticed increased head tremor, and pulling towards her right shoulder.  She developed  transient difficulty lifting her neck up when using BOTOX A 200 units previously, reponding well to decreased dose of 150 units  She was enrolled into IPSEN dysport study, first injection was in April 30th 2013, she later enrolled into open label, 2nd injection was in  May 28th 2013. She did very well.  Recent few weeks,  She had experienced flareup of her rheumatoid arthritis, has received IV infusion. Last injection was in 06/10/2012,  500 units of dysport was dissolved into 2 cc of normal saline. Right longissimus capitus 0.5 cc Right splenius capitis 0.5 cc Right levator scapular 0.5 cc Right iliocostalis 0.5 cc   Dysport research study has completed, she is now coming back for repeat injection, she noticed head bobbing over the past few weeks, she denies significant neck pain, weakness,  Her rheumatoid arthritis is under better control   Last injection was in Sep 2014, she did very well, no neck extension weakness, only rarely neck shaking  UPDATE March 8th 2016: She has lost follow-up since her last visit December 2014, She came in for treatment her cervical dystonia, she feel the tension of her neck all the time, stiffness. Denied gait difficulty, last injection was September 2014, she received 500 units of Dysport, works well for her, she is continue on medication for anxiety, rheumatoid arthritis She also complains of nighttime snoring, frequent awakening, excessive daytime sleepiness, fatigue, today's ESS is 6, FSS was 57,  UPDATE October 13 2014:   She has missed her scheduled sleep study,  continue have worsening neck posturing, posterior neck pain, excessive daytime fatigue and sleepiness  UPDATE Nov 11 2014: She came in for EMG guided Dysport injection today, last injection was December 2014, she complains  of worsening head titubation, bilateral hands mild postural tremor, posterior neck pain,  Update February 17 2015 She responded very well to last EMG guided Dysport injection Nov 11 2014, no significant head titubation, no significant neck pain  Update July 22 2015: Last injection was August, now she noticed returning of neck pulling and shaking, posterior neck muscle achy pain  Update October 21 2015: Last EMG guided Dysport injection was in January, she responded very well, only recent couple weeks, she noticed returning of neck pulling, shaking again, She has worsening rheumatoid arthritis, is receiving more frequent treatment, complains fatigue, diffuse body achy pain  Update March 15 2016: She responded very well to previous Dysport injection in April, no significant side effect noticed, only recent few weeks, 5 months from previous injection, she noticed return of head shaking neck muscle tightness.  UPDATE Dec 14th 2017: She responded very well to previous injection in Sept 2017, no significant side effect noticed.  Update September 13 2016: She did well this last injection in December  Update January 15 2017: She noticed worsening head tremor, she had excessive stress, her sister passed away from lung cancer in April 2018,  UPDATE Apr 25 2017: She responded very well with previous injection, barely has noticeable head shaking.  Update August 01, 2017: She responded very well to previous dysport injection, only recently began to have recurrent head shaking  UPDATE December 19 2017: She responded well to previous injection  UPDATE Jun 20 2018: She did very well with previous injection  UPDATE October 01 2018: She did well to previous injection  UPDATE January 01 2019: She did well with previous injection  UPDATE Oct 28th 2020: She did well to previous injections  UPDATE Jan 27th 2021: She did well to previous injections  Update October 29, 2019: She did well with previous injection, no significant side effect noted.  Update February 04, 2020, Uses Dysport 500 units, responding well  UPDATE Jun 02 2020: Uses Dysport 500 units, responding well  Update September 28, 2020: She responded well to previous injection, unfortunately she has been a longtime smoker, diagnosed with COPD, chest x-ray showed bibasilar atelectasis, and infiltration, small left pleural effusion on September 08, 2020  UPDATE December 01 2021: She did well with his previous injection,  UPDATE September 12 2021: She retired the end of October 2023, neck overall doing well well, only occasionally shaking, muscle tension  PHYSICAL EXAM   Today's Vitals   09/13/22 1538  BP: 131/80  Pulse: 77  Weight: 199 lb (90.3 kg)  Height: '5\' 5"'$  (1.651 m)   Body mass index is 33.12 kg/m.  PHYSICAL EXAMNIATION:   Mild retrocollis, mild right tilt,\with mild left shoulder elevation  ASSESSMENT AND PLAN  Shawntaya Mccreless is a 62 y.o. female with long-standing history of cervical dystonia, responded very well to EMG guided Dysport injection,  Mild retrocollis, right tilt, mild left shoulder elevation, occasionally no-no titubation  500 of Dysport was dissolved into 2.5 cc of normal saline,  Right longissimus capitus 0. 5cc Right splenius capitis 0.5 c Right splenius cervix 0.5 cc  Left splenius cervix 05 Left splenius capitis 0.5 Left levator scapular 0. 5 cc   Patient tolerate the injection well, will return to clinic in 3 months for repeat injection Please dissolve 500 units of Dysport into 2.5 cc of normal saline   Marcial Pacas, M.D. Ph.D.  [facility] [address], [place], [place] [address] Ph: [phone] Fax: [phone]

## 2022-09-15 ENCOUNTER — Other Ambulatory Visit: Payer: Self-pay | Admitting: Family Medicine

## 2022-09-15 DIAGNOSIS — K219 Gastro-esophageal reflux disease without esophagitis: Secondary | ICD-10-CM

## 2022-09-19 NOTE — Progress Notes (Signed)
Office Visit Note  Patient: Courtney Myers             Date of Birth: January 11, 1961           MRN: [id number]             PCP: Susy Frizzle, MD Referring: Susy Frizzle, MD Visit Date: 10/03/2022 Occupation: @GUAROCC @  Subjective:  Medication management  History of Present Illness: Courtney Myers is a 62 y.o. female with history of seronegative rheumatoid arthritis, osteoarthritis and fibromyalgia syndrome.  She continues to be on Rinvoq 15 mg p.o. daily which she has been tolerating well.  She has not had any flares since the last visit.  She denies any interruption in the therapy.  She continues to have minimal morning stiffness stiffness.  She states she has gained weight since she quit smoking and since then her knee joints have been hurting.  She has not noticed any swelling in her knees.    Activities of Daily Living:  Patient reports morning stiffness for 5-10 minutes.   Patient Reports nocturnal pain.  Difficulty dressing/grooming: Denies Difficulty climbing stairs: Denies Difficulty getting out of chair: Denies Difficulty using hands for taps, buttons, cutlery, and/or writing: Denies  Review of Systems  Constitutional:  Positive for fatigue.  HENT:  Positive for mouth dryness. Negative for mouth sores.   Eyes:  Negative for dryness.  Respiratory:  Negative for shortness of breath.   Cardiovascular:  Negative for chest pain and palpitations.  Gastrointestinal:  Negative for blood in stool, constipation and diarrhea.  Endocrine: Negative for increased urination.  Genitourinary:  Negative for involuntary urination.  Musculoskeletal:  Positive for morning stiffness. Negative for joint pain, gait problem, joint pain, joint swelling, myalgias, muscle weakness, muscle tenderness and myalgias.  Skin:  Negative for color change, rash, hair loss and sensitivity to sunlight.  Allergic/Immunologic: Negative for susceptible to infections.  Neurological:   Negative for dizziness and headaches.  Hematological:  Negative for swollen glands.  Psychiatric/Behavioral:  Negative for depressed mood and sleep disturbance. The patient is not nervous/anxious.     PMFS History:  Patient Active Problem List   Diagnosis Date Noted   History of tobacco abuse 02/26/2022   Family history of heart disease 02/26/2022   PVC (premature ventricular contraction) 02/26/2022   Cardiomyopathy 02/26/2022   Benign paroxysmal positional vertigo of right ear 08/25/2021   COPD (chronic obstructive pulmonary disease) 11/16/2020   Encounter for screening for lung cancer 12/29/2019   Excessive sleepiness 09/08/2014   Fibromyalgia syndrome 08/25/2014   RA (rheumatoid arthritis)    GERD (gastroesophageal reflux disease)    Anxiety    Depression    History of shingles    Synovitis of hand    Cervical dystonia 12/04/2012    Past Medical History:  Diagnosis Date   Anxiety    Cervical dystonia    COPD (chronic obstructive pulmonary disease) 10/2020   Depression    Fibromyalgia    GERD (gastroesophageal reflux disease)    History of shingles    RA (rheumatoid arthritis)    Synovitis of hand    Vertigo     Family History  Problem Relation Age of Onset   Arthritis Mother    Heart Problems Father    Heart disease Father    Heart disease Paternal Uncle        MI   Colon cancer Neg Hx    Colon polyps Neg Hx    Esophageal cancer Neg Hx  Rectal cancer Neg Hx    Stomach cancer Neg Hx    Past Surgical History:  Procedure Laterality Date   COLONOSCOPY  10/25/2009   TONSILLECTOMY     VAGINAL HYSTERECTOMY  09/01/2006   Social History   Social History Narrative   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 Right handed.    Caffeine- 4 cups daily.   Immunization History  Administered Date(s) Administered   Influenza,inj,Quad PF,6+ Mos 05/25/2018, 04/03/2019, 04/26/2020    Influenza-Unspecified 06/24/2014, 05/01/2015, 04/10/2016, 04/06/2017, 03/03/2022   Moderna Sars-Covid-2 Vaccination 08/29/2019, 09/26/2019, 06/19/2020   PNEUMOCOCCAL CONJUGATE-20 10/03/2021   Pneumococcal Conjugate-13 05/01/2015   Pneumococcal Polysaccharide-23 04/10/2016   Tdap 09/09/2015     Objective: Vital Signs: BP 115/72 (BP Location: Left Arm, Patient Position: Sitting, Cuff Size: Normal)   Pulse 73   Ht 5\' 6"  (1.676 m)   Wt 200 lb (90.7 kg)   BMI 32.28 kg/m    Physical Exam Vitals and nursing note reviewed.  Constitutional:      Appearance: She is well-developed.  HENT:     Head: Normocephalic and atraumatic.  Eyes:     Conjunctiva/sclera: Conjunctivae normal.  Cardiovascular:     Rate and Rhythm: Normal rate and regular rhythm.     Heart sounds: Normal heart sounds.  Pulmonary:     Effort: Pulmonary effort is normal.     Breath sounds: Normal breath sounds.  Abdominal:     General: Bowel sounds are normal.     Palpations: Abdomen is soft.  Musculoskeletal:     Cervical back: Normal range of motion.  Lymphadenopathy:     Cervical: No cervical adenopathy.  Skin:    General: Skin is warm and dry.     Capillary Refill: Capillary refill takes less than 2 seconds.  Neurological:     Mental Status: She is alert and oriented to person, place, and time.  Psychiatric:        Behavior: Behavior normal.      Musculoskeletal Exam: Cervical spine was in good range of motion.  She had no tenderness over thoracic or lumbar spine.  Shoulder joints, elbow joints, wrist joints, MCPs PIPs and DIPs been good range of motion with no synovitis.  Mild PIP and DIP thickening was noted.  Hip joints and knee joints were in good range of motion.  There was no tenderness over ankles or MTPs.  She had bilateral pes cavus and hammertoes.  CDAI Exam: CDAI Score: -- Patient Global: 2 mm; Provider Global: 2 mm Swollen: --; Tender: -- Joint Exam 10/03/2022   No joint exam has been  documented for this visit   There is currently no information documented on the homunculus. Go to the Rheumatology activity and complete the homunculus joint exam.  Investigation: No additional findings.  Imaging: XR Foot 2 Views Left  Result Date: 10/03/2022 First MTP, PIP and DIP narrowing was noted.  No intertarsal, tibiotalar or subtalar joint space narrowing was noted.  Inferior calcaneal spur was noted.  No comparison films were available. Impression: These findings are consistent with osteoarthritis of the foot.  XR Foot 2 Views Right  Result Date: 10/03/2022 First MTP, PIP and DIP narrowing was noted.  No intertarsal, tibiotalar or subtalar joint space narrowing was noted.  A small calcaneal spur was noted.  No erosive changes were noted.  No radiographic progression was noted when compared to the x-rays in 2018. Impression: These findings are consistent  with osteoarthritis of the  XR Hand 2 View Left  Result Date: 10/03/2022 CMC, PIP and DIP narrowing was noted.  No intercarpal or radiocarpal joint space narrowing was noted.  No erosive changes were noted.  No radiographic progression was noted when compared to the x-rays of 2018. Impression: These findings are consistent with osteoarthritis of the hand.  XR Hand 2 View Right  Result Date: 10/03/2022 CMC, PIP and DIP narrowing was noted.  No intercarpal or radiocarpal joint space narrowing was noted.  No erosive changes were noted.  No radiographic progression was noted when compared to the x-rays of 2018. Impression: These findings are consistent with osteoarthritis of the hand.   Recent Labs: Lab Results  Component Value Date   WBC 8.0 04/18/2022   HGB 13.0 04/18/2022   PLT 245 04/18/2022   NA 138 07/13/2022   K 4.2 07/13/2022   CL 103 07/13/2022   CO2 26 07/13/2022   GLUCOSE 102 (H) 07/13/2022   BUN 18 07/13/2022   CREATININE 0.99 07/13/2022   BILITOT 0.4 07/13/2022   ALKPHOS 91 11/18/2020   AST 21 07/13/2022   ALT 20  07/13/2022   PROT 7.1 07/13/2022   ALBUMIN 4.2 11/18/2020   CALCIUM 9.5 07/13/2022   GFRAA 61 11/02/2020   QFTBGOLD Negative 09/14/2015   QFTBGOLDPLUS NEGATIVE 04/18/2022    Speciality Comments: Remicade 6mg /kg every 6 weeks 07/13-09/21 Rinvoq 15 mg qd 04/28/20 MTX - dcd due to increase in [place] started 06/21/20-12/22- increase in cr   Procedures:  No procedures performed Allergies: Patient has no known allergies.   Assessment / Plan:     Visit Diagnoses: Rheumatoid arthritis of multiple sites with negative rheumatoid factor -she has not had a flare since the last visit.  She has minimal morning stiffness.  She has been on Rinvoq 15 mg p.o. daily since October 2021.  She has been tolerating Rinvoq well.  Will obtain x-rays today to monitor therapy.  Her last x-rays were in 2018.  Plan: XR Hand 2 View Right, XR Hand 2 View Left, XR Foot 2 Views Right, XR Foot 2 Views Left.  X-rays were consistent with osteoarthritis.  No radiographic progression was noted when compared to the x-rays of 2018.  High risk medication use - Rinvoq 15 mg 1 tablet by mouth daily (started 04/28/20).  Previous therapy: Remicade, Arava, MTX -she had CBC in October 2023 and CMP in January 2024.  TB Gold was negative on April 18, 2022.  Lipid panel was normal on July 13, 2022.  I will check CBC and CMP today.  Plan: CBC with Differential/Platelet, COMPLETE METABOLIC PANEL WITH GFR.  She was advised to get labs every 3 months.  TB Gold will be checked annually.  She was advised to hold Rinvoq if she develops an infection resume after the infection resolves.  Annual skin examination was advised.  Use of sunscreen was advised.  FDA blackbox warning for major adverse cardiovascular events, thrombosis, increased risk of mortality, serious infections and lymphoma was reviewed.  Patient has been walking on a daily basis.  Sicca complex-symptoms are  Chronic pain of both knees-she states that she has gained weight since  she quit smoking.  She has been experiencing increased discomfort in her knee joints.  No warmth swelling or effusion was noted.  She declined x-rays.  Fibromyalgia -her symptoms are manageable on Cymbalta.  She is on Cymbalta 60 mg 1 capsule daily.  Stretching exercises were advised.  Chronic fatigue-she can history of  some fatigue.  History of gastroesophageal reflux (GERD)  Anxiety and depression  History of COPD-followed by Dr. Vaughan Browner.  Former smoker - quit 11/22. 1PPD X 36 years.  Orders: Orders Placed This Encounter  Procedures   XR Hand 2 View Right   XR Hand 2 View Left   XR Foot 2 Views Right   XR Foot 2 Views Left   CBC with Differential/Platelet   COMPLETE METABOLIC PANEL WITH GFR   No orders of the defined types were placed in this encounter.    Follow-Up Instructions: Return in about 5 months (around 03/05/2023) for Rheumatoid arthritis.   Bo Merino, MD  Note - This record has been created using Editor, commissioning.  Chart creation errors have been sought, but may not always  have been located. Such creation errors do not reflect on  the standard of medical care.

## 2022-10-03 ENCOUNTER — Ambulatory Visit (INDEPENDENT_AMBULATORY_CARE_PROVIDER_SITE_OTHER): Payer: 59

## 2022-10-03 ENCOUNTER — Encounter: Payer: Self-pay | Admitting: Rheumatology

## 2022-10-03 ENCOUNTER — Ambulatory Visit: Payer: 59

## 2022-10-03 ENCOUNTER — Ambulatory Visit: Payer: 59 | Attending: Rheumatology | Admitting: Rheumatology

## 2022-10-03 VITALS — BP 115/72 | HR 73 | Ht 66.0 in | Wt 200.0 lb

## 2022-10-03 DIAGNOSIS — M79642 Pain in left hand: Secondary | ICD-10-CM

## 2022-10-03 DIAGNOSIS — F32A Depression, unspecified: Secondary | ICD-10-CM

## 2022-10-03 DIAGNOSIS — M79671 Pain in right foot: Secondary | ICD-10-CM | POA: Diagnosis not present

## 2022-10-03 DIAGNOSIS — F5101 Primary insomnia: Secondary | ICD-10-CM

## 2022-10-03 DIAGNOSIS — M25562 Pain in left knee: Secondary | ICD-10-CM

## 2022-10-03 DIAGNOSIS — M79641 Pain in right hand: Secondary | ICD-10-CM

## 2022-10-03 DIAGNOSIS — M79672 Pain in left foot: Secondary | ICD-10-CM | POA: Diagnosis not present

## 2022-10-03 DIAGNOSIS — M25561 Pain in right knee: Secondary | ICD-10-CM | POA: Diagnosis not present

## 2022-10-03 DIAGNOSIS — M0609 Rheumatoid arthritis without rheumatoid factor, multiple sites: Secondary | ICD-10-CM

## 2022-10-03 DIAGNOSIS — Z79899 Other long term (current) drug therapy: Secondary | ICD-10-CM | POA: Diagnosis not present

## 2022-10-03 DIAGNOSIS — M3500 Sicca syndrome, unspecified: Secondary | ICD-10-CM

## 2022-10-03 DIAGNOSIS — F419 Anxiety disorder, unspecified: Secondary | ICD-10-CM

## 2022-10-03 DIAGNOSIS — G8929 Other chronic pain: Secondary | ICD-10-CM

## 2022-10-03 DIAGNOSIS — N390 Urinary tract infection, site not specified: Secondary | ICD-10-CM

## 2022-10-03 DIAGNOSIS — Z8709 Personal history of other diseases of the respiratory system: Secondary | ICD-10-CM

## 2022-10-03 DIAGNOSIS — R5382 Chronic fatigue, unspecified: Secondary | ICD-10-CM

## 2022-10-03 DIAGNOSIS — Z8719 Personal history of other diseases of the digestive system: Secondary | ICD-10-CM

## 2022-10-03 DIAGNOSIS — M797 Fibromyalgia: Secondary | ICD-10-CM

## 2022-10-03 DIAGNOSIS — Z87891 Personal history of nicotine dependence: Secondary | ICD-10-CM

## 2022-10-03 NOTE — Patient Instructions (Signed)
Standing Labs We placed an order today for your standing lab work.   Please have your standing labs drawn in July and every 3 months  Please have your labs drawn 2 weeks prior to your appointment so that the provider can discuss your lab results at your appointment, if possible.  Please note that you may see your imaging and lab results in [facility] before we have reviewed them. We will contact you once all results are reviewed. Please allow our office up to 72 hours to thoroughly review all of the results before contacting the office for clarification of your results.  WALK-IN LAB HOURS  Monday through Thursday from 8:00 am -12:30 pm and 1:00 pm-5:00 pm and Friday from 8:00 am-12:00 pm.  Patients with office visits requiring labs will be seen before walk-in labs.  You may encounter longer than normal wait times. Please allow additional time. Wait times may be shorter on  Monday and Thursday afternoons.  We do not book appointments for walk-in labs. We appreciate your patience and understanding with our staff.   Labs are drawn by [facility]. Please bring your co-pay at the time of your lab draw.  You may receive a bill from [facility] for your lab work.  Please note if you are on Hydroxychloroquine and and an order has been placed for a Hydroxychloroquine level,  you will need to have it drawn 4 hours or more after your last dose.  If you wish to have your labs drawn at another location, please call the office 24 hours in advance so we can fax the orders.  The office is located at [address], [place], [place], [place] [address]   If you have any questions regarding directions or hours of operation,  please call [phone].   As a reminder, please drink plenty of water prior to coming for your lab work. Thanks!   Vaccines You are taking a medication(s) that can suppress your immune system.  The following immunizations are recommended: Flu annually Covid-19  Td/Tdap (tetanus, diphtheria,  pertussis) every 10 years Pneumonia (Prevnar 15 then Pneumovax 23 at least 1 year apart.  Alternatively, can take Prevnar 20 without needing additional dose) Shingrix: 2 doses from 4 weeks to 6 months apart  Please check with your PCP to make sure you are up to date.   If you have signs or symptoms of an infection or start antibiotics: First, call your PCP for workup of your infection. Hold your medication through the infection, until you complete your antibiotics, and until symptoms resolve if you take the following: Injectable medication (Actemra, Benlysta, Cimzia, Cosentyx, Enbrel, Humira, Kevzara, Orencia, Remicade, Simponi, Stelara, Taltz, Tremfya) Methotrexate Leflunomide (Arava) Mycophenolate (Cellcept) Roma Kayser, or Rinvoq   Because you are taking Morrie Sheldon, Rinvoq, or Olumiant, it is very important to know that this class of medications has a FDA BLACK BOX WARNING for major adverse cardiovascular events (MACE), thrombosis, mortality (including sudden cardiovascular death), serious infections, and lymphomas. MACE is defined as cardiovascular death, myocardial infarction, and stroke. Thrombosis includes deep venous thrombosis (DVT), pulmonary embolism (PE), and arterial thrombosis. If you are a current or former smoker, you are at higher risk for MACE.   Heart Disease Prevention   Your inflammatory disease increases your risk of heart disease which includes heart attack, stroke, atrial fibrillation (irregular heartbeats), high blood pressure, heart failure and atherosclerosis (plaque in the arteries).  It is important to reduce your risk by:   Keep blood pressure, cholesterol, and blood sugar  at healthy levels   Smoking Cessation   Maintain a healthy weight  BMI 20-25   Eat a healthy diet  Plenty of fresh fruit, vegetables, and whole grains  Limit saturated fats, foods high in sodium, and added sugars  DASH and Mediterranean diet   Increase physical activity  Recommend  moderate physically activity for 150 minutes per week/ 30 minutes a day for five days a week These can be broken up into three separate ten-minute sessions during the day.   Reduce Stress  Meditation, slow breathing exercises, yoga, coloring books  Dental visits twice a year

## 2022-10-04 LAB — COMPLETE METABOLIC PANEL WITH GFR
AG Ratio: 1.6 (calc) (ref 1.0–2.5)
ALT: 17 U/L (ref 6–29)
AST: 21 U/L (ref 10–35)
Albumin: 4.2 g/dL (ref 3.6–5.1)
Alkaline phosphatase (APISO): 80 U/L (ref 37–153)
BUN: 14 mg/dL (ref 7–25)
CO2: 24 mmol/L (ref 20–32)
Calcium: 9.4 mg/dL (ref 8.6–10.4)
Chloride: 104 mmol/L (ref 98–110)
Creat: 1.04 mg/dL (ref 0.50–1.05)
Globulin: 2.7 g/dL (calc) (ref 1.9–3.7)
Glucose, Bld: 85 mg/dL (ref 65–99)
Potassium: 4.2 mmol/L (ref 3.5–5.3)
Sodium: 138 mmol/L (ref 135–146)
Total Bilirubin: 0.3 mg/dL (ref 0.2–1.2)
Total Protein: 6.9 g/dL (ref 6.1–8.1)
eGFR: 60 mL/min/{1.73_m2} (ref >=60)

## 2022-10-04 LAB — CBC WITH DIFFERENTIAL/PLATELET
Absolute Monocytes: 855 cells/uL (ref 200–950)
Basophils Absolute: 73 cells/uL (ref 0–200)
Basophils Relative: 0.8 %
Eosinophils Absolute: 137 cells/uL (ref 15–500)
Eosinophils Relative: 1.5 %
HCT: 36.7 % (ref 35.0–45.0)
Hemoglobin: 12.2 g/dL (ref 11.7–15.5)
Lymphs Abs: 2775.5 cells/uL (ref 850–3900)
MCH: 29.6 pg (ref 27.0–33.0)
MCHC: 33.2 g/dL (ref 32.0–36.0)
MCV: 89.1 fL (ref 80.0–100.0)
MPV: 11.3 fL (ref 7.5–12.5)
Monocytes Relative: 9.4 %
Neutro Abs: 5260 cells/uL (ref 1500–7800)
Neutrophils Relative %: 57.8 %
Platelets: 252 10*3/uL (ref 140–400)
RBC: 4.12 10*6/uL (ref 3.80–5.10)
RDW: 12.8 % (ref 11.0–15.0)
Total Lymphocyte: 30.5 %
WBC: 9.1 10*3/uL (ref 3.8–10.8)

## 2022-10-04 NOTE — Progress Notes (Signed)
CBC and CMP are normal.

## 2022-10-06 ENCOUNTER — Encounter: Payer: Self-pay | Admitting: Family Medicine

## 2022-10-10 ENCOUNTER — Encounter: Payer: Self-pay | Admitting: Family Medicine

## 2022-10-18 ENCOUNTER — Ambulatory Visit (HOSPITAL_BASED_OUTPATIENT_CLINIC_OR_DEPARTMENT_OTHER): Payer: 59 | Admitting: Cardiology

## 2022-10-29 ENCOUNTER — Other Ambulatory Visit: Payer: Self-pay | Admitting: Rheumatology

## 2022-10-30 NOTE — Telephone Encounter (Signed)
Last Fill: 08/07/2022  Labs: 10/03/2022 CBC and CMP are normal.   TB Gold: 04/18/2022 negative    Next Visit: 03/07/2023  Last Visit: 10/03/2022  [id number] arthritis of multiple sites with negative rheumatoid factor   Current Dose per office note on 10/03/2022: Rinvoq 15 mg 1 tablet by mouth daily   Okay to refill Rinvoq?

## 2022-11-07 ENCOUNTER — Other Ambulatory Visit: Payer: Self-pay | Admitting: Family Medicine

## 2022-12-04 ENCOUNTER — Telehealth: Payer: Self-pay | Admitting: Neurology

## 2022-12-04 NOTE — Telephone Encounter (Signed)
Received approval via [facility] portal for Dysport:  [id number] (12/04/22-12/04/23).

## 2023-01-01 ENCOUNTER — Ambulatory Visit: Payer: 59 | Admitting: Neurology

## 2023-01-01 ENCOUNTER — Telehealth: Payer: Self-pay | Admitting: Pharmacy Technician

## 2023-01-01 VITALS — BP 112/73

## 2023-01-01 DIAGNOSIS — H8111 Benign paroxysmal vertigo, right ear: Secondary | ICD-10-CM

## 2023-01-01 MED ORDER — ABOBOTULINUMTOXINA 500 UNITS IM SOLR
500.0000 [IU] | Freq: Once | INTRAMUSCULAR | Status: AC
Start: 2023-01-01 — End: 2023-01-01
  Administered 2023-01-01: 500 [IU] via INTRAMUSCULAR

## 2023-01-01 NOTE — Progress Notes (Signed)
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  Symptom onset was without apparent triggering event, she has gradually developed this neck pulling,  manifested primarily with right laterocollis, mild retrocolis, and left shoulder elevation since 2000. She has good relief with regular Botox injection, about 4 times a year since 2003. Last injection was February 18, 2010 by Dr. Marta Antu,  She reported great relief as usual, taking 4- 5 days to get symptom relieve, lasting about 3 months.  I began to inject her since 12.2011, every 3-4 months, dosage has been limited to 150 units of BOTOX A.  She denies neck pain, gait difficulty, but with wearing off Botox, she noticed increased head tremor, and pulling towards her right shoulder.  She developed  transient difficulty lifting her neck up when using BOTOX A 200 units previously, reponding well to decreased dose of 150 units  She was enrolled into IPSEN dysport study, first injection was in April 30th 2013, she later enrolled into open label, 2nd injection was in  May 28th 2013. She did very well.  Recent few weeks,  She had experienced flareup of her rheumatoid arthritis, has received IV infusion. Last injection was in 06/10/2012,  500 units of dysport was dissolved into 2 cc of normal saline. Right longissimus capitus 0.5 cc Right splenius capitis 0.5 cc Right levator scapular 0.5 cc Right iliocostalis 0.5 cc   Dysport research study has completed, she is now coming back for repeat injection, she noticed head bobbing over the past few weeks, she denies significant neck pain, weakness,  Her rheumatoid arthritis is under better control   Last injection was in Sep 2014, she did very well, no neck extension weakness, only rarely neck shaking  UPDATE March 8th 2016: She has lost follow-up since her last  visit December 2014, She came in for treatment her cervical dystonia, she feel the tension of her neck all the time, stiffness. Denied gait difficulty, last injection was September 2014, she received 500 units of Dysport, works well for her, she is continue on medication for anxiety, rheumatoid arthritis She also complains of nighttime snoring, frequent awakening, excessive daytime sleepiness, fatigue, today's ESS is 6, FSS was 19,  UPDATE October 13 2014:   She has missed her scheduled sleep study, continue have worsening neck posturing, posterior neck pain, excessive daytime fatigue and sleepiness  UPDATE Nov 11 2014: She came in for EMG guided Dysport injection today, last injection was December 2014, she complains of worsening head titubation, bilateral hands mild postural tremor, posterior neck pain,  Update February 17 2015 She responded very well to last EMG guided Dysport injection Nov 11 2014, no significant head titubation, no significant neck pain  Update July 22 2015: Last injection was August, now she noticed returning of neck pulling and shaking, posterior neck muscle achy pain  Update October 21 2015: Last EMG guided Dysport injection was in January, she responded very well, only recent couple weeks, she noticed returning of neck pulling, shaking again, She has worsening rheumatoid arthritis, is receiving more frequent treatment, complains fatigue, diffuse body achy pain  Update March 15 2016: She responded very well to previous Dysport injection in April, no significant side effect noticed, only recent few weeks, 5 months from previous injection, she noticed return of head shaking neck muscle tightness.  UPDATE Dec 14th 2017: She responded very well  to previous injection in Sept 2017, no significant side effect noticed.  Update September 13 2016: She did well this last injection in December  Update January 15 2017: She noticed worsening head tremor, she had excessive stress,  her sister passed away from lung cancer in April 2018,  UPDATE Apr 25 2017: She responded very well with previous injection, barely has noticeable head shaking.  Update August 01, 2017: She responded very well to previous dysport injection, only recently began to have recurrent head shaking  UPDATE December 19 2017: She responded well to previous injection  UPDATE Sept 25 2019: She responded very well to previous injection  Update February 2022 2023  She responded well to previous injection, her rheumatoid arthritis is under excellent control,  She reported sudden onset of acute vertigo on July 22, 2020 turning to the right side, nausea, unsteady gait at the beginning of the onset, over the past few weeks, her symptoms has improved, but with sudden positional change, especially turning towards the right side, it will trigger transient dizziness, she denies hearing loss.  REVIEW OF SYSTEMS: Full 14 system review of systems performed and notable only for ear pain, joint pain, joint swelling, achy muscles, muscle cramps, rash, tremors, depression, anxiety  ALLERGIES: No Known Allergies  HOME MEDICATIONS: Current Outpatient Medications  Medication Sig Dispense Refill   AbobotulinumtoxinA (DYSPORT) 500 units SOLR injection INJECT 500 UNITS  INTRAMUSCULARLY EVERY 3  MONTHS (GIVEN AT MD OFFICE, DISCARD UNUSED) 1 each 3   aspirin EC 81 MG tablet Take 1 tablet (81 mg total) by mouth daily. Swallow whole. 90 tablet 3   clonazePAM (KLONOPIN) 0.5 MG tablet TAKE 1 TABLET BY MOUTH TWICE DAILY AS NEEDED 60 tablet 1   DULoxetine (CYMBALTA) 60 MG capsule TAKE 1 CAPSULE(60 MG) BY MOUTH DAILY 30 capsule 2   Fluticasone-Umeclidin-Vilant (TRELEGY ELLIPTA) 200-62.5-25 MCG/ACT AEPB Inhale 1 puff into the lungs daily. 180 each 3   metoprolol succinate (TOPROL-XL) 25 MG 24 hr tablet Take 1 tablet (25 mg total) by mouth daily. Take with or immediately following a meal. 30 tablet 11   Multiple  Vitamins-Minerals (MULTIVITAMINS THER. W/MINERALS) TABS Take 1 tablet by mouth at bedtime.      omeprazole (PRILOSEC) 20 MG capsule TAKE 1 CAPSULE BY MOUTH DAILY 60 capsule 5   RINVOQ 15 MG TB24 TAKE 1 TABLET BY MOUTH DAILY 90 tablet 0   rosuvastatin (CRESTOR) 20 MG tablet TAKE 1 TABLET(20 MG) BY MOUTH DAILY 90 tablet 1   traMADol (ULTRAM) 50 MG tablet Take 1 - 2 every 8 hours as needed for pain. 30 tablet 0   Xylitol (XYLIMELTS MT) Use as directed 2 tablets in the mouth or throat at bedtime as needed (dry mouth).      Current Facility-Administered Medications  Medication Dose Route Frequency Provider Last Rate Last Admin   0.9 %  sodium chloride infusion  500 mL Intravenous Once Nandigam, Kavitha V, MD       AbobotulinumtoxinA (DYSPORT) 500 units injection 500 Units  500 Units Intramuscular Once Levert Feinstein, MD        PAST MEDICAL HISTORY: Past Medical History:  Diagnosis Date   Anxiety    Cervical dystonia    COPD (chronic obstructive pulmonary disease) (HCC) 10/2020   Depression    Fibromyalgia    GERD (gastroesophageal reflux disease)    History of shingles    RA (rheumatoid arthritis) (HCC)    Synovitis of hand    Vertigo     PAST  SURGICAL HISTORY: Past Surgical History:  Procedure Laterality Date   COLONOSCOPY  10/25/2009   TONSILLECTOMY     VAGINAL HYSTERECTOMY  09/01/2006    FAMILY HISTORY: Family History  Problem Relation Age of Onset   Arthritis Mother    Heart Problems Father    Heart disease Father    Heart disease Paternal Uncle        MI   Colon cancer Neg Hx    Colon polyps Neg Hx    Esophageal cancer Neg Hx    Rectal cancer Neg Hx    Stomach cancer Neg Hx     SOCIAL HISTORY:  Social History   Socioeconomic History   Marital status: Married    Spouse name: Courtney Myers   Number of children: 2   Years of education: 12   Highest education level: Not on file  Occupational History    Employer: [employer]  Tobacco Use   Smoking status: Former     Packs/day: 0.25    Years: 34.00    Additional pack years: 0.00    Total pack years: 8.50    Types: Cigarettes    Start date: 1979    Quit date: 11/16/2020    Years since quitting: 2.1    Passive exposure: Never   Smokeless tobacco: Never  Vaping Use   Vaping Use: Never used  Substance and Sexual Activity   Alcohol use: Yes    Alcohol/week: 0.0 standard drinks of alcohol    Comment: Rare, twice per year   Drug use: Never   Sexual activity: Yes    Birth control/protection: Surgical    Comment: 1st intercourse 62 yo-Fewer than 5 partners  Other Topics Concern   Not on file  Social History Narrative   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 Right handed.    Caffeine- 4 cups daily.   Social Determinants of Health   Financial Resource Strain: Not on file  Food Insecurity: Not on file  Transportation Needs: Not on file  Physical Activity: Not on file  Stress: Not on file  Social Connections: Not on file  Intimate Partner Violence: Not on file     PHYSICAL EXAM   Vitals:   01/01/23 0845  BP: 112/73     Gen: NAD, conversant, well nourised, well groomed             NEUROLOGICAL EXAM:  MENTAL STATUS: Speech/cognition: Awake, alert, oriented to history taking and casual conversation   CRANIAL NERVES: She has mild retrocollis, right tilt, slight left shoulder elevation CN II: Visual fields are full to confrontation.  Pupils are round equal and briskly reactive to light. CN III, IV, VI: extraocular movement are normal. No ptosis. CN V: Facial sensation is intact to pinprick in all 3 divisions bilaterally. Corneal responses are intact.  CN VII: Face is symmetric with normal eye closure and smile. CN VIII: Hearing is normal to casual conversation CN IX, X: Palate elevates symmetrically. Phonation is normal. CN XI: Head turning and shoulder shrug are intact  MOTOR: There is no pronator  drift of out-stretched arms. Muscle bulk and tone are normal. Muscle strength is normal.  COORDINATION: Rapid alternating movements and fine finger movements are intact. There is no dysmetria on finger-to-nose and heel-knee-shin.    GAIT/STANCE: Posture is normal. Gait is steady    I performed Epley's maneuver, right ear dependent position after short latency will trigger rotatory downward beating nystagmus,  habituate quickly, on the second repeat Epley's maneuver, the nystagmus and her vertigo sensation has much improved  DIAGNOSTIC DATA (LABS, IMAGING, TESTING) - I reviewed patient records, labs, notes, testing and imaging myself where available.  ASSESSMENT AND PLAN  Courtney Myers is a 62 y.o. female with long-standing history of cervical dystonia, responded very well to previous EMG guided Dysport injection, last injection was May 2016, responded very well.   Mild retrocollis, right tilt, slight left shoulder elevation  500 of Dysport was dissolved into 2.5 cc of normal saline,  Right longissimus capitus 0. 5 cc Left splenius capitis 0.5 cc Right inferior oblique capitis, 0.5 cc  Left inferior oblique capitis 0.5 cc Left levator scapular 0.5   Benign positional vertigo  Symptom onset since July 22, 2021,  I performed Epley maneuver, right ear dependent position will trigger downward beating rotatory nystagmus, quickly improved after 2 repeat procedures   Levert Feinstein, M.D. Ph.D.  [facility] [address] [place], [place] [address] Ph: [phone] Fax: [phone]      PATIENT: Courtney Myers DOB: Nov 12, 1960  HISTORICAL  Courtney Myers  is a 62 year-old right-handed Caucasian female, came in for EMG guided Botox for cervical dystonia.  Symptom onset was without apparent triggering event, she has gradually developed this neck pulling,  manifested primarily with right laterocollis, mild retrocolis, and left  shoulder elevation since 2000. She has good relief with regular Botox injection, about 4 times a year since 2003. Last injection was February 18, 2010 by Dr. Deneen Harts,  She reported great relief as usual, taking 4- 5 days to get symptom relieve, lasting about 3 months.  I began to inject her since 12.2011, every 3-4 months, dosage has been limited to 150 units of BOTOX A.  She denies neck pain, gait difficulty, but with wearing off Botox, she noticed increased head tremor, and pulling towards her right shoulder.  She developed  transient difficulty lifting her neck up when using BOTOX A 200 units previously, reponding well to decreased dose of 150 units  She was enrolled into IPSEN dysport study, first injection was in April 30th 2013, she later enrolled into open label, 2nd injection was in  May 28th 2013. She did very well.  Recent few weeks,  She had experienced flareup of her rheumatoid arthritis, has received IV infusion. Last injection was in 06/10/2012,  500 units of dysport was dissolved into 2 cc of normal saline. Right longissimus capitus 0.5 cc Right splenius capitis 0.5 cc Right levator scapular 0.5 cc Right iliocostalis 0.5 cc   Dysport research study has completed, she is now coming back for repeat injection, she noticed head bobbing over the past few weeks, she denies significant neck pain, weakness,  Her rheumatoid arthritis is under better control   Last injection was in Sep 2014, she did very well, no neck extension weakness, only rarely neck shaking  UPDATE March 8th 2016: She has lost follow-up since her last visit December 2014, She came in for treatment her cervical dystonia, she feel the tension of her neck all the time, stiffness. Denied gait difficulty, last injection was September 2014, she received 500 units of Dysport, works well for her, she is continue on medication for anxiety, rheumatoid arthritis She also complains of nighttime snoring, frequent awakening,  excessive daytime sleepiness, fatigue, today's ESS is 6, FSS was 58,  UPDATE October 13 2014:   She has missed her scheduled sleep study, continue have worsening neck  posturing, posterior neck pain, excessive daytime fatigue and sleepiness  UPDATE Nov 11 2014: She came in for EMG guided Dysport injection today, last injection was December 2014, she complains of worsening head titubation, bilateral hands mild postural tremor, posterior neck pain,  Update February 17 2015 She responded very well to last EMG guided Dysport injection Nov 11 2014, no significant head titubation, no significant neck pain  Update July 22 2015: Last injection was August, now she noticed returning of neck pulling and shaking, posterior neck muscle achy pain  Update October 21 2015: Last EMG guided Dysport injection was in January, she responded very well, only recent couple weeks, she noticed returning of neck pulling, shaking again, She has worsening rheumatoid arthritis, is receiving more frequent treatment, complains fatigue, diffuse body achy pain  Update March 15 2016: She responded very well to previous Dysport injection in April, no significant side effect noticed, only recent few weeks, 5 months from previous injection, she noticed return of head shaking neck muscle tightness.  UPDATE Dec 14th 2017: She responded very well to previous injection in Sept 2017, no significant side effect noticed.  Update September 13 2016: She did well this last injection in December  Update January 15 2017: She noticed worsening head tremor, she had excessive stress, her sister passed away from lung cancer in April 2018,  UPDATE Apr 25 2017: She responded very well with previous injection, barely has noticeable head shaking.  Update August 01, 2017: She responded very well to previous dysport injection, only recently began to have recurrent head shaking  UPDATE December 19 2017: She responded well to previous  injection  UPDATE Jun 20 2018: She did very well with previous injection  UPDATE October 01 2018: She did well to previous injection  UPDATE January 01 2019: She did well with previous injection  UPDATE Oct 28th 2020: She did well to previous injections  UPDATE Jan 27th 2021: She did well to previous injections  Update October 29, 2019: She did well with previous injection, no significant side effect noted.  Update February 04, 2020, Uses Dysport 500 units, responding well  UPDATE Jun 02 2020: Uses Dysport 500 units, responding well  Update September 28, 2020: She responded well to previous injection, unfortunately she has been a longtime smoker, diagnosed with COPD, chest x-ray showed bibasilar atelectasis, and infiltration, small left pleural effusion on September 08, 2020  UPDATE December 01 2021: She did well with his previous injection,  UPDATE September 12 2021: She retired the end of October 2023, neck overall doing well well, only occasionally shaking, muscle tension  UPDATE January 01 2023: She responded well to previous injection  PHYSICAL EXAM   Today's Vitals   01/01/23 0845  BP: 112/73   There is no height or weight on file to calculate BMI.  PHYSICAL EXAMNIATION:   Mild retrocollis, mild right tilt,\with mild left shoulder elevation  ASSESSMENT AND PLAN  Jahan Leath is a 63 y.o. female with long-standing history of cervical dystonia, responded very well to EMG guided Dysport injection,  Mild retrocollis, right tilt, mild left shoulder elevation, occasionally no-no titubation ( more muscle activities on left paraspinal muscles)  500 of Dysport was dissolved into 2.5 cc of normal saline,  Right splenius cervix 0.5 cc  Left splenius cervix 05x2=1.0 cc Left splenius capitis 0.5 Left levator scapular 0. 5 cc   Patient tolerate the injection well, will return to clinic in 3 months for repeat injection Please dissolve 500 units of  Dysport into 2.5 cc of normal  saline   Levert Feinstein, M.D. Ph.D.  [facility] [address], [address] [place], [place] [address] Ph: [phone] Fax: [phone]

## 2023-01-01 NOTE — Progress Notes (Signed)
Dysport 500units x 1 vial  Ndc-[address]-1 [id number] Exp-08/02/2024 B/B  Bacteriostatic 0.9% Sodium Chloride- 2.5 mL  Lot: [id number] Expiration: 10/02/23 NDC: 0454-0981-19 Dx: G24.3 WITNESSED [id number] MARTIN

## 2023-01-01 NOTE — Telephone Encounter (Signed)
Patient Advocate Encounter  Prior Authorization for Rinvoq 15mg  has been approved with [employer].    PA# Key: [id number] - PA Case ID: [id number] Effective dates: 01/01/23 through 01/01/24

## 2023-01-01 NOTE — Telephone Encounter (Signed)
Submitted a Prior Authorization request to [facility] for [facility] via CoverMyMeds. Will update once we receive a response.  Key: [id number]

## 2023-01-07 ENCOUNTER — Other Ambulatory Visit: Payer: Self-pay | Admitting: Family Medicine

## 2023-01-08 ENCOUNTER — Other Ambulatory Visit: Payer: Self-pay | Admitting: *Deleted

## 2023-01-08 MED ORDER — DULOXETINE HCL 60 MG PO CPEP: ORAL_CAPSULE | ORAL | 2 refills | Status: DC

## 2023-01-08 NOTE — Telephone Encounter (Signed)
Requested medications are due for refill today.  Yes  Requested medications are on the active medications list.  yes  Last refill. 11/07/2022  #60 1 rf  Future visit scheduled.   no  Notes to clinic.  Refill not delegated.    Requested Prescriptions  Pending Prescriptions Disp Refills   clonazePAM (KLONOPIN) 0.5 MG tablet [Pharmacy Med Name: CLONAZEPAM 0.5MG  TABLETS] 60 tablet     Sig: TAKE 1 TABLET BY MOUTH TWICE DAILY AS NEEDED     Not Delegated - Psychiatry: Anxiolytics/Hypnotics 2 Failed - 01/07/2023 12:35 PM      Failed - This refill cannot be delegated      Failed - Urine Drug Screen completed in last 360 days      Failed - Valid encounter within last 6 months    Recent Outpatient Visits           1 year ago Encounter for screening mammogram for malignant neoplasm of breast   [facility] Donita Brooks, MD   1 year ago Benign paroxysmal positional vertigo, unspecified laterality   [facility] Pickard, Priscille Heidelberg, MD   1 year ago Encounter for tobacco use cessation counseling   [facility] Donita Brooks, MD   2 years ago Rheumatoid arthritis involving multiple sites, unspecified whether rheumatoid factor present [facility])   [facility] Pickard, Priscille Heidelberg, MD   2 years ago Shortness of breath   [facility] Pickard, Priscille Heidelberg, MD       Future Appointments             In 1 month [facility] Lenice Llamas [facility]            Passed - Patient is not pregnant

## 2023-01-08 NOTE — Telephone Encounter (Signed)
Last Fill: 09/06/2022  Next Visit: 03/07/2023  Last Visit: 10/03/2022  Dx: Rheumatoid arthritis of multiple sites with negative rheumatoid factor   Current Dose per office note on 10/03/2022: Cymbalta 60 mg 1 capsule daily   Okay to refill Cymbalta?

## 2023-01-10 ENCOUNTER — Other Ambulatory Visit (HOSPITAL_BASED_OUTPATIENT_CLINIC_OR_DEPARTMENT_OTHER): Payer: Self-pay | Admitting: Cardiology

## 2023-01-10 DIAGNOSIS — I493 Ventricular premature depolarization: Secondary | ICD-10-CM

## 2023-01-10 DIAGNOSIS — I429 Cardiomyopathy, unspecified: Secondary | ICD-10-CM

## 2023-01-17 ENCOUNTER — Ambulatory Visit: Payer: 59 | Admitting: Neurology

## 2023-01-29 ENCOUNTER — Other Ambulatory Visit: Payer: Self-pay | Admitting: Physician Assistant

## 2023-01-29 DIAGNOSIS — Z9225 Personal history of immunosupression therapy: Secondary | ICD-10-CM

## 2023-01-29 DIAGNOSIS — Z111 Encounter for screening for respiratory tuberculosis: Secondary | ICD-10-CM

## 2023-01-29 NOTE — Telephone Encounter (Signed)
Last Fill: 10/30/2022  Labs: 10/03/2022 CBC and CMP are normal.   TB Gold: 04/18/2022   Next Visit: 03/07/2023  Last Visit: 10/03/2022  [id number] arthritis of multiple sites with negative rheumatoid factor   Current Dose per office note 10/03/2022: Rinvoq 15 mg p.o. daily   Patient advised she is due to update labs. Patient states she will update them this week.   Okay to refill Rinvoq?

## 2023-02-07 ENCOUNTER — Other Ambulatory Visit: Payer: Self-pay | Admitting: *Deleted

## 2023-02-07 DIAGNOSIS — Z79899 Other long term (current) drug therapy: Secondary | ICD-10-CM

## 2023-02-07 LAB — CBC WITH DIFFERENTIAL/PLATELET
Absolute Monocytes: 811 cells/uL (ref 200–950)
Basophils Absolute: 73 cells/uL (ref 0–200)
Basophils Relative: 0.7 %
Eosinophils Absolute: 114 cells/uL (ref 15–500)
Eosinophils Relative: 1.1 %
HCT: 41.4 % (ref 35.0–45.0)
Hemoglobin: 13.7 g/dL (ref 11.7–15.5)
Lymphs Abs: 2496 cells/uL (ref 850–3900)
MCH: 29.7 pg (ref 27.0–33.0)
MCHC: 33.1 g/dL (ref 32.0–36.0)
MCV: 89.6 fL (ref 80.0–100.0)
MPV: 11.3 fL (ref 7.5–12.5)
Monocytes Relative: 7.8 %
Neutro Abs: 6906 cells/uL (ref 1500–7800)
Neutrophils Relative %: 66.4 %
Platelets: 284 10*3/uL (ref 140–400)
RBC: 4.62 10*6/uL (ref 3.80–5.10)
RDW: 13.5 % (ref 11.0–15.0)
Total Lymphocyte: 24 %
WBC: 10.4 10*3/uL (ref 3.8–10.8)

## 2023-02-08 ENCOUNTER — Other Ambulatory Visit: Payer: Self-pay | Admitting: *Deleted

## 2023-02-08 DIAGNOSIS — M3500 Sicca syndrome, unspecified: Secondary | ICD-10-CM

## 2023-02-08 DIAGNOSIS — Z79899 Other long term (current) drug therapy: Secondary | ICD-10-CM

## 2023-02-08 DIAGNOSIS — M0609 Rheumatoid arthritis without rheumatoid factor, multiple sites: Secondary | ICD-10-CM

## 2023-02-08 NOTE — Progress Notes (Signed)
Creatinine is elevated.  We are not prescribing any medications which should cause elevated creatinine.  Please advise patient to avoid all NSAIDs and increase water intake.  She should have repeat BMP with GFR in 2 weeks.  CBC is normal.

## 2023-02-10 ENCOUNTER — Other Ambulatory Visit: Payer: Self-pay | Admitting: Cardiology

## 2023-02-13 ENCOUNTER — Other Ambulatory Visit (HOSPITAL_BASED_OUTPATIENT_CLINIC_OR_DEPARTMENT_OTHER): Payer: Self-pay | Admitting: Cardiology

## 2023-02-13 DIAGNOSIS — I493 Ventricular premature depolarization: Secondary | ICD-10-CM

## 2023-02-13 DIAGNOSIS — I429 Cardiomyopathy, unspecified: Secondary | ICD-10-CM

## 2023-02-21 ENCOUNTER — Other Ambulatory Visit: Payer: Self-pay | Admitting: Physician Assistant

## 2023-02-21 NOTE — Telephone Encounter (Signed)
Last Fill: 01/29/2023 (30 day supply)  Labs: 02/07/2023 Creatinine is elevated. CBC is normal   TB Gold: 04/18/2022 Neg    Next Visit: 03/07/2023  Last Visit: 10/03/2022  [id number] arthritis of multiple sites with negative rheumatoid factor   Current Dose per office note 10/03/2022: Rinvoq 15 mg p.o. daily   Okay to refill Rinvoq?

## 2023-02-21 NOTE — Progress Notes (Signed)
Office Visit Note  Patient: Courtney Myers             Date of Birth: 02-Nov-1960           MRN: [id number]             PCP: Courtney Brooks, MD Referring: Courtney Brooks, MD Visit Date: 03/07/2023 Occupation: @GUAROCC @  Subjective:  Medication monitoring   History of Present Illness: Courtney Myers is a 62 y.o. female with history of seronegative rheumatoid arthritis and fibromyalgia.  Patient remains on Rinvoq 15 mg 1 tablet by mouth daily (started 04/28/20).  She is tolerating Rinvoq without any side effects or injection site reactions.  She has not missed any doses recently.  She denies any recent or recurrent infections.  She is planning on getting the annual flu shot.  She denies any signs or symptoms of a rheumatoid arthritis flare.  She denies any joint swelling at this time.  Patient has been active walking her dogs on a daily basis.  She denies any new medical conditions.    Activities of Daily Living:  Patient reports morning stiffness for 5-10 minutes.   Patient Reports nocturnal pain.  Difficulty dressing/grooming: Denies Difficulty climbing stairs: Denies Difficulty getting out of chair: Denies Difficulty using hands for taps, buttons, cutlery, and/or writing: Denies  Review of Systems  Constitutional:  Positive for fatigue.  HENT:  Positive for mouth dryness. Negative for mouth sores.   Eyes:  Positive for dryness.  Respiratory:  Negative for shortness of breath.   Cardiovascular:  Negative for chest pain and palpitations.  Gastrointestinal:  Negative for blood in stool, constipation and diarrhea.  Endocrine: Negative for increased urination.  Genitourinary:  Negative for involuntary urination.  Musculoskeletal:  Positive for joint pain, joint pain, joint swelling, myalgias, morning stiffness, muscle tenderness and myalgias. Negative for gait problem and muscle weakness.  Skin:  Negative for color change, rash, hair loss and sensitivity to  sunlight.  Allergic/Immunologic: Negative for susceptible to infections.  Neurological:  Positive for dizziness. Negative for headaches.  Hematological:  Negative for swollen glands.  Psychiatric/Behavioral:  Negative for depressed mood and sleep disturbance. The patient is not nervous/anxious.     PMFS History:  Patient Active Problem List   Diagnosis Date Noted   History of tobacco abuse 02/26/2022   Family history of heart disease 02/26/2022   PVC (premature ventricular contraction) 02/26/2022   Cardiomyopathy (HCC) 02/26/2022   Benign paroxysmal positional vertigo of right ear 08/25/2021   COPD (chronic obstructive pulmonary disease) (HCC) 11/16/2020   Encounter for screening for lung cancer 12/29/2019   Excessive sleepiness 09/08/2014   Fibromyalgia syndrome 08/25/2014   RA (rheumatoid arthritis) (HCC)    GERD (gastroesophageal reflux disease)    Anxiety    Depression    History of shingles    Synovitis of hand    Cervical dystonia 12/04/2012    Past Medical History:  Diagnosis Date   Anxiety    Cervical dystonia    COPD (chronic obstructive pulmonary disease) (HCC) 10/2020   Depression    Fibromyalgia    GERD (gastroesophageal reflux disease)    History of shingles    RA (rheumatoid arthritis) (HCC)    Synovitis of hand    Vertigo     Family History  Problem Relation Age of Onset   Arthritis Mother    Heart Problems Father    Heart disease Father    Heart disease Paternal Uncle  MI   Colon cancer Neg Hx    Colon polyps Neg Hx    Esophageal cancer Neg Hx    Rectal cancer Neg Hx    Stomach cancer Neg Hx    Past Surgical History:  Procedure Laterality Date   COLONOSCOPY  10/25/2009   TONSILLECTOMY     VAGINAL HYSTERECTOMY  09/01/2006   Social History   Social History Narrative   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 Right handed.    Caffeine- 4 cups daily.    Immunization History  Administered Date(s) Administered   Influenza,inj,Quad PF,6+ Mos 05/25/2018, 04/03/2019, 04/26/2020   Influenza-Unspecified 06/24/2014, 05/01/2015, 04/10/2016, 04/06/2017, 03/03/2022   Moderna Sars-Covid-2 Vaccination 08/29/2019, 09/26/2019, 06/19/2020   PNEUMOCOCCAL CONJUGATE-20 10/03/2021   Pneumococcal Conjugate-13 05/01/2015   Pneumococcal Polysaccharide-23 04/10/2016   Tdap 09/09/2015     Objective: Vital Signs: BP 108/73 (BP Location: Left Arm, Patient Position: Sitting, Cuff Size: Normal)   Pulse 69   Resp 16   Ht 5\' 6"  (1.676 m)   Wt 189 lb 9.6 oz (86 kg)   BMI 30.60 kg/m    Physical Exam Vitals and nursing note reviewed.  Constitutional:      Appearance: She is well-developed.  HENT:     Head: Normocephalic and atraumatic.  Eyes:     Conjunctiva/sclera: Conjunctivae normal.  Cardiovascular:     Rate and Rhythm: Normal rate and regular rhythm.     Heart sounds: Normal heart sounds.  Pulmonary:     Effort: Pulmonary effort is normal.     Breath sounds: Normal breath sounds.  Abdominal:     General: Bowel sounds are normal.     Palpations: Abdomen is soft.  Musculoskeletal:     Cervical back: Normal range of motion.  Lymphadenopathy:     Cervical: No cervical adenopathy.  Skin:    General: Skin is warm and dry.     Capillary Refill: Capillary refill takes less than 2 seconds.  Neurological:     Mental Status: She is alert and oriented to person, place, and time.  Psychiatric:        Behavior: Behavior normal.      Musculoskeletal Exam: C-spine, thoracic spine, lumbar spine have good range of motion.  Shoulder joints, elbow joints, wrist joints, MCPs, PIPs, DIPs have good range of motion with no synovitis.  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  Complete fist formation bilaterally.  Hip joints have good range of motion with no groin pain.  Knee joints have good range of motion with no warmth or  effusion.  Ankle joints have good range of motion with no tenderness or joint swelling.  CDAI Exam: CDAI Score: -- Patient Global: 20 / 100; Provider Global: 20 / 100 Swollen: --; Tender: -- Joint Exam 03/07/2023   No joint exam has been documented for this visit   There is currently no information documented on the homunculus. Go to the Rheumatology activity and complete the homunculus joint exam.  Investigation: No additional findings.  Imaging: No results found.  Recent Labs: Lab Results  Component Value Date   WBC 10.4 02/07/2023   HGB 13.7 02/07/2023   PLT 284 02/07/2023   NA 137 02/22/2023   K 4.3 02/22/2023   CL 102 02/22/2023   CO2 25 02/22/2023   GLUCOSE 86 02/22/2023   BUN 16 02/22/2023   CREATININE 0.94 02/22/2023   BILITOT 0.4 02/07/2023  ALKPHOS 91 11/18/2020   AST 20 02/07/2023   ALT 16 02/07/2023   PROT 6.9 02/07/2023   ALBUMIN 4.2 11/18/2020   CALCIUM 9.8 02/22/2023   GFRAA 61 11/02/2020   QFTBGOLD Negative 09/14/2015   QFTBGOLDPLUS NEGATIVE 04/18/2022    Speciality Comments: Remicade 6mg /kg every 6 weeks 07/13-09/21 Rinvoq 15 mg qd 04/28/20 MTX - dcd due to increase in Cr Arava started 06/21/20-12/22- increase in cr   Procedures:  No procedures performed Allergies: Patient has no known allergies.   Assessment / Plan:     Visit Diagnoses: Rheumatoid arthritis of multiple sites with negative rheumatoid factor (HCC): She has no joint tenderness or synovitis on examination today.  She has not had any signs or symptoms of a rheumatoid arthritis flare.  She has clinically been doing well taking Rinvoq 15 mg 1 tablet by mouth daily.  She is tolerating Rinvoq without any side effects and has not missed any doses recently.  She has not had any recent or recurrent infections.  She will remain on Rinvoq as prescribed.  She was advised to notify us if she develops signs or symptoms of a flare.  She will follow-up in the office in 5 months or sooner if  needed. Association of heart disease with rheumatoid arthritis was discussed. Need to monitor blood pressure, cholesterol, and to exercise 30-60 minutes on daily basis was discussed.   High risk medication use - Rinvoq 15 mg 1 tablet by mouth daily (started 04/28/20).  Previous therapy: Remicade, Arava, methotrexate.  TB gold negative on 04/18/22.  Order for TB Gold placed today. CBC and CMP updated on 02/07/23.  She will be due to updated lab work in November and every 3 months.  Lipid panel within normal limits on 07/13/2022. No recent or recurrent infections.  Discussed the importance of holding rinvoq if she develops signs or symptoms of an infection and to resume once the infection has completely cleared.  Counseled on the increase risk of venous thrombosis. Counseled about FDA black box warning of MACE (major adverse CV events including cardiovascular death, myocardial infarction, and stroke).  Discussed the importance of close blood pressure monitoring, cholesterol monitoring, regular exercise, and following a healthy diet.  Reviewed with patient that there is the possibility of an increased risk of malignancy specifically lung cancer and lymphomas but it is not well understood if this increased risk is due to the medication or the disease state.  Discussed the importance of remaining up-to-date with age-related cancer screenings including yearly skin examinations. Instructed patient that medication should be held for infection and prior to surgery.  Advised patient to avoid live vaccines. Recommend annual influenza, PCV 15 or PCV20 or Pneumovax 23, and Shingrix as indicated.   - Plan: QuantiFERON-TB Gold Plus  Screening for tuberculosis -Future order for TB Gold placed today.  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    Chronic pain of both knees: She has good ROM of both knee joints.  No warmth or effusion noted.   Fibromyalgia - She  has intermittent myalgias and muscle tenderness due to fibromyalgia.  Overall her symptoms have been stable.  She has been walking her dogs for exercise.  She remains on Cymbalta 60 mg 1 capsule daily which she continues to find to be helpful.  Chronic fatigue: Stable. Discussed the importance of regular exercise.    Other medical conditions are listed as follows:  History of gastroesophageal reflux (GERD)  Anxiety and  depression: She remains on Cymbalta 60 mg 1 capsule daily.  History of COPD  Former smoker  Primary insomnia   Orders: Orders Placed This Encounter  Procedures   QuantiFERON-TB Gold Plus   No orders of the defined types were placed in this encounter.   Follow-Up Instructions: Return in about 5 months (around 08/07/2023) for Rheumatoid arthritis, Fibromyalgia.   Gearldine Bienenstock, PA-C  Note - This record has been created using Dragon software.  Chart creation errors have been sought, but may not always  have been located. Such creation errors do not reflect on  the standard of medical care.

## 2023-02-22 ENCOUNTER — Other Ambulatory Visit: Payer: Self-pay | Admitting: *Deleted

## 2023-02-22 DIAGNOSIS — Z79899 Other long term (current) drug therapy: Secondary | ICD-10-CM

## 2023-02-22 DIAGNOSIS — M0609 Rheumatoid arthritis without rheumatoid factor, multiple sites: Secondary | ICD-10-CM

## 2023-02-22 DIAGNOSIS — M3500 Sicca syndrome, unspecified: Secondary | ICD-10-CM

## 2023-02-23 LAB — BASIC METABOLIC PANEL WITH GFR
BUN: 16 mg/dL (ref 7–25)
CO2: 25 mmol/L (ref 20–32)
Calcium: 9.8 mg/dL (ref 8.6–10.4)
Chloride: 102 mmol/L (ref 98–110)
Creat: 0.94 mg/dL (ref 0.50–1.05)
Glucose, Bld: 86 mg/dL (ref 65–99)
Potassium: 4.3 mmol/L (ref 3.5–5.3)
Sodium: 137 mmol/L (ref 135–146)
eGFR: 68 mL/min/{1.73_m2} (ref >=60)

## 2023-02-23 NOTE — Progress Notes (Signed)
BMP is normal.  Patient should continue to avoid NSAIDs and increase water intake.

## 2023-03-07 ENCOUNTER — Ambulatory Visit: Payer: 59 | Attending: Physician Assistant | Admitting: Physician Assistant

## 2023-03-07 ENCOUNTER — Encounter: Payer: Self-pay | Admitting: Physician Assistant

## 2023-03-07 VITALS — BP 108/73 | HR 69 | Resp 16 | Ht 66.0 in | Wt 189.6 lb

## 2023-03-07 DIAGNOSIS — F419 Anxiety disorder, unspecified: Secondary | ICD-10-CM

## 2023-03-07 DIAGNOSIS — F5101 Primary insomnia: Secondary | ICD-10-CM

## 2023-03-07 DIAGNOSIS — R5382 Chronic fatigue, unspecified: Secondary | ICD-10-CM

## 2023-03-07 DIAGNOSIS — M25561 Pain in right knee: Secondary | ICD-10-CM | POA: Diagnosis not present

## 2023-03-07 DIAGNOSIS — M797 Fibromyalgia: Secondary | ICD-10-CM

## 2023-03-07 DIAGNOSIS — Z87891 Personal history of nicotine dependence: Secondary | ICD-10-CM

## 2023-03-07 DIAGNOSIS — M0609 Rheumatoid arthritis without rheumatoid factor, multiple sites: Secondary | ICD-10-CM

## 2023-03-07 DIAGNOSIS — Z8719 Personal history of other diseases of the digestive system: Secondary | ICD-10-CM

## 2023-03-07 DIAGNOSIS — M25562 Pain in left knee: Secondary | ICD-10-CM

## 2023-03-07 DIAGNOSIS — Z79899 Other long term (current) drug therapy: Secondary | ICD-10-CM | POA: Diagnosis not present

## 2023-03-07 DIAGNOSIS — M3500 Sicca syndrome, unspecified: Secondary | ICD-10-CM | POA: Diagnosis not present

## 2023-03-07 DIAGNOSIS — Z8709 Personal history of other diseases of the respiratory system: Secondary | ICD-10-CM

## 2023-03-07 DIAGNOSIS — G8929 Other chronic pain: Secondary | ICD-10-CM

## 2023-03-07 DIAGNOSIS — F32A Depression, unspecified: Secondary | ICD-10-CM

## 2023-03-07 DIAGNOSIS — Z111 Encounter for screening for respiratory tuberculosis: Secondary | ICD-10-CM

## 2023-03-07 NOTE — Patient Instructions (Signed)

## 2023-03-13 ENCOUNTER — Other Ambulatory Visit: Payer: Self-pay | Admitting: Family Medicine

## 2023-03-14 NOTE — Telephone Encounter (Signed)
Requested medication (s) are due for refill today: yes  Requested medication (s) are on the active medication list: yes  Last refill:  01/08/23  Future visit scheduled: no  Notes to clinic:  Unable to refill per protocol, cannot delegate.      Requested Prescriptions  Pending Prescriptions Disp Refills   clonazePAM (KLONOPIN) 0.5 MG tablet [Pharmacy Med Name: CLONAZEPAM 0.5MG  TABLETS] 60 tablet     Sig: TAKE 1 TABLET BY MOUTH TWICE DAILY AS NEEDED     Not Delegated - Psychiatry: Anxiolytics/Hypnotics 2 Failed - 03/13/2023 11:52 AM      Failed - This refill cannot be delegated      Failed - Urine Drug Screen completed in last 360 days      Failed - Valid encounter within last 6 months    Recent Outpatient Visits           1 year ago Encounter for screening mammogram for malignant neoplasm of breast   [facility] Donita Brooks, MD   1 year ago Benign paroxysmal positional vertigo, unspecified laterality   [facility] Pickard, Priscille Heidelberg, MD   1 year ago Encounter for tobacco use cessation counseling   [facility] Donita Brooks, MD   2 years ago Rheumatoid arthritis involving multiple sites, unspecified whether rheumatoid factor present [facility])   [facility] Pickard, Priscille Heidelberg, MD   2 years ago Shortness of breath   [facility] Pickard, Priscille Heidelberg, MD       Future Appointments             In 4 months Pollyann Savoy, MD [facility]            Passed - Patient is not pregnant

## 2023-03-16 ENCOUNTER — Other Ambulatory Visit: Payer: Self-pay | Admitting: Physician Assistant

## 2023-03-28 ENCOUNTER — Ambulatory Visit: Payer: 59 | Admitting: Neurology

## 2023-03-28 ENCOUNTER — Encounter: Payer: Self-pay | Admitting: Neurology

## 2023-03-28 VITALS — BP 107/71 | HR 59 | Ht 66.0 in | Wt 189.6 lb

## 2023-03-28 DIAGNOSIS — G243 Spasmodic torticollis: Secondary | ICD-10-CM | POA: Diagnosis not present

## 2023-03-28 MED ORDER — ABOBOTULINUMTOXINA 500 UNITS IM SOLR
1000.0000 [IU] | Freq: Once | INTRAMUSCULAR | Status: AC
Start: 2023-03-28 — End: 2023-03-28
  Administered 2023-03-28: 1000 [IU] via INTRAMUSCULAR

## 2023-03-28 NOTE — Progress Notes (Signed)
Dysport 500units x 1 vial  [id number] [phone] Exp-02.26.2026 B/B Bacteriostatic 0.9% Sodium Chloride- 5 mL  Lot: [id number] Expiration: 04.01.2025 NDC: [id number] Dx: G24.3 WITNESSED FI:EPPIR, RN

## 2023-03-28 NOTE — Progress Notes (Signed)
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  Symptom onset was without apparent triggering event, she has gradually developed this neck pulling,  manifested primarily with right laterocollis, mild retrocolis, and left shoulder elevation since 2000. She has good relief with regular Botox injection, about 4 times a year since 2003. Last injection was February 18, 2010 by Dr. Deneen Harts,  She reported great relief as usual, taking 4- 5 days to get symptom relieve, lasting about 3 months.  I began to inject her since 12.2011, every 3-4 months, dosage has been limited to 150 units of BOTOX A.  She denies neck pain, gait difficulty, but with wearing off Botox, she noticed increased head tremor, and pulling towards her right shoulder.  She developed  transient difficulty lifting her neck up when using BOTOX A 200 units previously, reponding well to decreased dose of 150 units  She was enrolled into IPSEN dysport study, first injection was in April 30th 2013, she later enrolled into open label, 2nd injection was in  May 28th 2013. She did very well.  Recent few weeks,  She had experienced flareup of her rheumatoid arthritis, has received IV infusion. Last injection was in 06/10/2012,  500 units of dysport was dissolved into 2 cc of normal saline. Right longissimus capitus 0.5 cc Right splenius capitis 0.5 cc Right levator scapular 0.5 cc Right iliocostalis 0.5 cc   Dysport research study has completed, she is now coming back for repeat injection, she noticed head bobbing over the past few weeks, she denies significant neck pain, weakness,  Her rheumatoid arthritis is under better control   Last injection was in Sep 2014, she did very well, no neck extension weakness, only rarely neck shaking  UPDATE March 8th 2016: She has lost follow-up since her last  visit December 2014, She came in for treatment her cervical dystonia, she feel the tension of her neck all the time, stiffness. Denied gait difficulty, last injection was September 2014, she received 500 units of Dysport, works well for her, she is continue on medication for anxiety, rheumatoid arthritis She also complains of nighttime snoring, frequent awakening, excessive daytime sleepiness, fatigue, today's ESS is 6, FSS was 41,  UPDATE October 13 2014:   She has missed her scheduled sleep study, continue have worsening neck posturing, posterior neck pain, excessive daytime fatigue and sleepiness  UPDATE Nov 11 2014: She came in for EMG guided Dysport injection today, last injection was December 2014, she complains of worsening head titubation, bilateral hands mild postural tremor, posterior neck pain,  Update February 17 2015 She responded very well to last EMG guided Dysport injection Nov 11 2014, no significant head titubation, no significant neck pain  Update July 22 2015: Last injection was August, now she noticed returning of neck pulling and shaking, posterior neck muscle achy pain  Update October 21 2015: Last EMG guided Dysport injection was in January, she responded very well, only recent couple weeks, she noticed returning of neck pulling, shaking again, She has worsening rheumatoid arthritis, is receiving more frequent treatment, complains fatigue, diffuse body achy pain  Update March 15 2016: She responded very well to previous Dysport injection in April, no significant side effect noticed, only recent few weeks, 5 months from previous injection, she noticed return of head shaking neck muscle tightness.  UPDATE Dec 14th 2017: She responded very well  to previous injection in Sept 2017, no significant side effect noticed.  Update September 13 2016: She did well this last injection in December  Update January 15 2017: She noticed worsening head tremor, she had excessive stress,  her sister passed away from lung cancer in April 2018,  UPDATE Apr 25 2017: She responded very well with previous injection, barely has noticeable head shaking.  Update August 01, 2017: She responded very well to previous dysport injection, only recently began to have recurrent head shaking  UPDATE December 19 2017: She responded well to previous injection  UPDATE Sept 25 2019: She responded very well to previous injection  Update February 2022 2023  She responded well to previous injection, her rheumatoid arthritis is under excellent control,  She reported sudden onset of acute vertigo on July 22, 2020 turning to the right side, nausea, unsteady gait at the beginning of the onset, over the past few weeks, her symptoms has improved, but with sudden positional change, especially turning towards the right side, it will trigger transient dizziness, she denies hearing loss.  REVIEW OF SYSTEMS: Full 14 system review of systems performed and notable only for ear pain, joint pain, joint swelling, achy muscles, muscle cramps, rash, tremors, depression, anxiety  ALLERGIES: No Known Allergies  HOME MEDICATIONS: Current Outpatient Medications  Medication Sig Dispense Refill   AbobotulinumtoxinA (DYSPORT) 500 units SOLR injection INJECT 500 UNITS  INTRAMUSCULARLY EVERY 3  MONTHS (GIVEN AT MD OFFICE, DISCARD UNUSED) 1 each 3   aspirin EC 81 MG tablet Take 1 tablet (81 mg total) by mouth daily. Swallow whole. (Patient not taking: Reported on 03/07/2023) 90 tablet 3   clonazePAM (KLONOPIN) 0.5 MG tablet TAKE 1 TABLET BY MOUTH TWICE DAILY AS NEEDED 60 tablet 0   DULoxetine (CYMBALTA) 60 MG capsule TAKE 1 CAPSULE(60 MG) BY MOUTH DAILY 30 capsule 2   Fluticasone-Umeclidin-Vilant (TRELEGY ELLIPTA) 200-62.5-25 MCG/ACT AEPB Inhale 1 puff into the lungs daily. 180 each 3   metoprolol succinate (TOPROL-XL) 25 MG 24 hr tablet Take 1 tablet (25 mg total) by mouth daily. NEED APPOINTMENT 30 tablet 0   Multiple  Vitamins-Minerals (MULTIVITAMINS THER. W/MINERALS) TABS Take 1 tablet by mouth at bedtime.      omeprazole (PRILOSEC) 20 MG capsule TAKE 1 CAPSULE BY MOUTH DAILY 60 capsule 5   RINVOQ 15 MG TB24 TAKE 1 TABLET BY MOUTH DAILY 90 tablet 0   rosuvastatin (CRESTOR) 20 MG tablet Take 1 tablet (20 mg total) by mouth daily. NEED APPOINTMENT 90 tablet 0   traMADol (ULTRAM) 50 MG tablet Take 1 - 2 every 8 hours as needed for pain. (Patient not taking: Reported on 03/07/2023) 30 tablet 0   Xylitol (XYLIMELTS MT) Use as directed 2 tablets in the mouth or throat at bedtime as needed (dry mouth).      Current Facility-Administered Medications  Medication Dose Route Frequency Provider Last Rate Last Admin   0.9 %  sodium chloride infusion  500 mL Intravenous Once Nandigam, Kavitha V, MD       AbobotulinumtoxinA (DYSPORT) 500 units injection 1,000 Units  1,000 Units Intramuscular Once Levert Feinstein, MD        PAST MEDICAL HISTORY: Past Medical History:  Diagnosis Date   Anxiety    Cervical dystonia    COPD (chronic obstructive pulmonary disease) (HCC) 10/2020   Depression    Fibromyalgia    GERD (gastroesophageal reflux disease)    History of shingles    RA (rheumatoid arthritis) (HCC)    Synovitis  of hand    Vertigo     PAST SURGICAL HISTORY: Past Surgical History:  Procedure Laterality Date   COLONOSCOPY  10/25/2009   TONSILLECTOMY     VAGINAL HYSTERECTOMY  09/01/2006    FAMILY HISTORY: Family History  Problem Relation Age of Onset   Arthritis Mother    Heart Problems Father    Heart disease Father    Heart disease Paternal Uncle        MI   Colon cancer Neg Hx    Colon polyps Neg Hx    Esophageal cancer Neg Hx    Rectal cancer Neg Hx    Stomach cancer Neg Hx     SOCIAL HISTORY:  Social History   Socioeconomic History   Marital status: Married    Spouse name: Harvie Heck   Number of children: 2   Years of education: 12   Highest education level: Not on file  Occupational History     Employer: [employer]  Tobacco Use   Smoking status: Former    Current packs/day: 0.00    Average packs/day: 0.3 packs/day for 43.4 years (10.8 ttl pk-yrs)    Types: Cigarettes    Start date: 39    Quit date: 11/16/2020    Years since quitting: 2.3    Passive exposure: Never   Smokeless tobacco: Never  Vaping Use   Vaping status: Never Used  Substance and Sexual Activity   Alcohol use: Yes    Alcohol/week: 0.0 standard drinks of alcohol    Comment: Rare, twice per year   Drug use: Never   Sexual activity: Yes    Birth control/protection: Surgical    Comment: 1st intercourse 62 yo-Fewer than 5 partners  Other Topics Concern   Not on file  Social History Narrative   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 Right handed.    Caffeine- 4 cups daily.   Social Determinants of Health   Financial Resource Strain: Not on file  Food Insecurity: Not on file  Transportation Needs: Not on file  Physical Activity: Not on file  Stress: Not on file  Social Connections: Not on file  Intimate Partner Violence: Not on file     PHYSICAL EXAM   Vitals:   03/28/23 0847  BP: 107/71  Pulse: (!) 59  Weight: 189 lb 9.5 oz (86 kg)  Height: 5\' 6"  (1.676 m)     Gen: NAD, conversant, well nourised, well groomed             NEUROLOGICAL EXAM:  MENTAL STATUS: Speech/cognition: Awake, alert, oriented to history taking and casual conversation   CRANIAL NERVES: She has mild retrocollis, right tilt, slight left shoulder elevation CN II: Visual fields are full to confrontation.  Pupils are round equal and briskly reactive to light. CN III, IV, VI: extraocular movement are normal. No ptosis. CN V: Facial sensation is intact to pinprick in all 3 divisions bilaterally. Corneal responses are intact.  CN VII: Face is symmetric with normal eye closure and smile. CN VIII: Hearing is normal to casual  conversation CN IX, X: Palate elevates symmetrically. Phonation is normal. CN XI: Head turning and shoulder shrug are intact  MOTOR: There is no pronator drift of out-stretched arms. Muscle bulk and tone are normal. Muscle strength is normal.  COORDINATION: Rapid alternating movements and fine finger movements are intact. There is no dysmetria on finger-to-nose and heel-knee-shin.    GAIT/STANCE: Posture is  normal. Gait is steady    I performed Epley's maneuver, right ear dependent position after short latency will trigger rotatory downward beating nystagmus, habituate quickly, on the second repeat Epley's maneuver, the nystagmus and her vertigo sensation has much improved  DIAGNOSTIC DATA (LABS, IMAGING, TESTING) - I reviewed patient records, labs, notes, testing and imaging myself where available.  ASSESSMENT AND PLAN  Verva Krotzer is a 62 y.o. female with long-standing history of cervical dystonia, responded very well to previous EMG guided Dysport injection, last injection was May 2016, responded very well.   Mild retrocollis, right tilt, slight left shoulder elevation  500 of Dysport was dissolved into 2.5 cc of normal saline,  Right longissimus capitus 0. 5 cc Left splenius capitis 0.5 cc Right inferior oblique capitis, 0.5 cc  Left inferior oblique capitis 0.5 cc Left levator scapular 0.5   Benign positional vertigo  Symptom onset since July 22, 2021,  I performed Epley maneuver, right ear dependent position will trigger downward beating rotatory nystagmus, quickly improved after 2 repeat procedures   Levert Feinstein, M.D. Ph.D.  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  Symptom onset was  without apparent triggering event, she has gradually developed this neck pulling,  manifested primarily with right laterocollis, mild retrocolis, and left shoulder elevation since 2000. She has good relief with regular Botox injection, about 4 times a year since 2003. Last injection was February 18, 2010 by Dr. Deneen Harts,  She reported great relief as usual, taking 4- 5 days to get symptom relieve, lasting about 3 months.  I began to inject her since 12.2011, every 3-4 months, dosage has been limited to 150 units of BOTOX A.  She denies neck pain, gait difficulty, but with wearing off Botox, she noticed increased head tremor, and pulling towards her right shoulder.  She developed  transient difficulty lifting her neck up when using BOTOX A 200 units previously, reponding well to decreased dose of 150 units  She was enrolled into IPSEN dysport study, first injection was in April 30th 2013, she later enrolled into open label, 2nd injection was in  May 28th 2013. She did very well.  Recent few weeks,  She had experienced flareup of her rheumatoid arthritis, has received IV infusion. Last injection was in 06/10/2012,  500 units of dysport was dissolved into 2 cc of normal saline. Right longissimus capitus 0.5 cc Right splenius capitis 0.5 cc Right levator scapular 0.5 cc Right iliocostalis 0.5 cc   Dysport research study has completed, she is now coming back for repeat injection, she noticed head bobbing over the past few weeks, she denies significant neck pain, weakness,  Her rheumatoid arthritis is under better control   Last injection was in Sep 2014, she did very well, no neck extension weakness, only rarely neck shaking  UPDATE March 8th 2016: She has lost follow-up since her last visit December 2014, She came in for treatment her cervical dystonia, she feel the tension of her neck all the time, stiffness. Denied gait difficulty, last injection was September 2014, she received 500 units of  Dysport, works well for her, she is continue on medication for anxiety, rheumatoid arthritis She also complains of nighttime snoring, frequent awakening, excessive daytime sleepiness, fatigue, today's  ESS is 6, FSS was 63,  UPDATE October 13 2014:   She has missed her scheduled sleep study, continue have worsening neck posturing, posterior neck pain, excessive daytime fatigue and sleepiness  UPDATE Nov 11 2014: She came in for EMG guided Dysport injection today, last injection was December 2014, she complains of worsening head titubation, bilateral hands mild postural tremor, posterior neck pain,  Update February 17 2015 She responded very well to last EMG guided Dysport injection Nov 11 2014, no significant head titubation, no significant neck pain  Update July 22 2015: Last injection was August, now she noticed returning of neck pulling and shaking, posterior neck muscle achy pain  Update October 21 2015: Last EMG guided Dysport injection was in January, she responded very well, only recent couple weeks, she noticed returning of neck pulling, shaking again, She has worsening rheumatoid arthritis, is receiving more frequent treatment, complains fatigue, diffuse body achy pain  Update March 15 2016: She responded very well to previous Dysport injection in April, no significant side effect noticed, only recent few weeks, 5 months from previous injection, she noticed return of head shaking neck muscle tightness.  UPDATE Dec 14th 2017: She responded very well to previous injection in Sept 2017, no significant side effect noticed.  Update September 13 2016: She did well this last injection in December  Update January 15 2017: She noticed worsening head tremor, she had excessive stress, her sister passed away from lung cancer in April 2018,  UPDATE Apr 25 2017: She responded very well with previous injection, barely has noticeable head shaking.  Update August 01, 2017: She responded very  well to previous dysport injection, only recently began to have recurrent head shaking  UPDATE December 19 2017: She responded well to previous injection  UPDATE Jun 20 2018: She did very well with previous injection  UPDATE October 01 2018: She did well to previous injection  UPDATE January 01 2019: She did well with previous injection  UPDATE Oct 28th 2020: She did well to previous injections  UPDATE Jan 27th 2021: She did well to previous injections  Update October 29, 2019: She did well with previous injection, no significant side effect noted.  Update February 04, 2020, Uses Dysport 500 units, responding well  UPDATE Jun 02 2020: Uses Dysport 500 units, responding well  Update September 28, 2020: She responded well to previous injection, unfortunately she has been a longtime smoker, diagnosed with COPD, chest x-ray showed bibasilar atelectasis, and infiltration, small left pleural effusion on September 08, 2020  UPDATE December 01 2021: She did well with his previous injection,  UPDATE September 12 2021: She retired the end of October 2023, neck overall doing well well, only occasionally shaking, muscle tension  UPDATE January 01 2023: She responded well to previous injection  PHYSICAL EXAM   Today's Vitals   03/28/23 0847  BP: 107/71  Pulse: (!) 59  Weight: 189 lb 9.5 oz (86 kg)  Height: 5\' 6"  (1.676 m)   Body mass index is 30.6 kg/m.  PHYSICAL EXAMNIATION:   Mild retrocollis, mild right tilt,\with mild left shoulder elevation  ASSESSMENT AND PLAN  Kayse Deanda is a 62 y.o. female with long-standing history of cervical dystonia, responded very well to EMG guided Dysport injection,  Mild retrocollis, right tilt, mild left shoulder elevation, occasionally no-no titubation ( more muscle activities on left paraspinal muscles)  500 of Dysport was dissolved into 2.5 cc of normal saline,  Right splenius capitus 0.25 cc Right  sternocleidomastoid 0.25 cc  Left splenius cervix  05x2=1.0 cc Left splenius capitis 0.5 Left levator scapular 0. 5 cc   Patient tolerate the injection well, will return to clinic in 3 months for repeat injection Please dissolve 500 units of Dysport into 2.5 cc of normal saline   Levert Feinstein, M.D. Ph.D.  [facility] [address] [place], [place] [address] Ph: [phone] Fax: [phone]

## 2023-04-13 ENCOUNTER — Other Ambulatory Visit (HOSPITAL_BASED_OUTPATIENT_CLINIC_OR_DEPARTMENT_OTHER): Payer: Self-pay | Admitting: Cardiology

## 2023-04-13 ENCOUNTER — Encounter: Payer: Self-pay | Admitting: Rheumatology

## 2023-04-13 DIAGNOSIS — I493 Ventricular premature depolarization: Secondary | ICD-10-CM

## 2023-04-13 DIAGNOSIS — I429 Cardiomyopathy, unspecified: Secondary | ICD-10-CM

## 2023-04-13 MED ORDER — DULOXETINE HCL 60 MG PO CPEP: ORAL_CAPSULE | ORAL | 2 refills | Status: DC

## 2023-04-13 NOTE — Telephone Encounter (Signed)
Last Fill: 01/08/2023  Next Visit: 08/07/2023  Last Visit: 03/07/2023  Dx: Fibromyalgia   Current Dose per office note on 03/07/2023: Cymbalta 60 mg 1 capsule daily   Okay to refill Cymbalta?

## 2023-04-22 ENCOUNTER — Other Ambulatory Visit: Payer: Self-pay | Admitting: Family Medicine

## 2023-04-24 ENCOUNTER — Other Ambulatory Visit: Payer: Self-pay | Admitting: Family Medicine

## 2023-04-24 NOTE — Telephone Encounter (Signed)
Requested medication (s) are due for refill today: yes  Requested medication (s) are on the active medication list: yes  Last refill:  03/19/23 #60  Future visit scheduled: no  Notes to clinic: med not delegated to NT to RF   Requested Prescriptions  Pending Prescriptions Disp Refills   clonazePAM (KLONOPIN) 0.5 MG tablet [Pharmacy Med Name: CLONAZEPAM 0.5MG  TABLETS] 60 tablet     Sig: TAKE 1 TABLET BY MOUTH TWICE DAILY AS NEEDED     Not Delegated - Psychiatry: Anxiolytics/Hypnotics 2 Failed - 04/22/2023  9:42 PM      Failed - This refill cannot be delegated      Failed - Urine Drug Screen completed in last 360 days      Failed - Valid encounter within last 6 months    Recent Outpatient Visits           1 year ago Encounter for screening mammogram for malignant neoplasm of breast   [facility] Donita Brooks, MD   1 year ago Benign paroxysmal positional vertigo, unspecified laterality   [facility] Donita Brooks, MD   2 years ago Encounter for tobacco use cessation counseling   [facility] Donita Brooks, MD   2 years ago Rheumatoid arthritis involving multiple sites, unspecified whether rheumatoid factor present [facility])   [facility] Pickard, Priscille Heidelberg, MD   2 years ago Shortness of breath   [facility] Pickard, Priscille Heidelberg, MD       Future Appointments             In 3 months Pollyann Savoy, MD [facility]            Passed - Patient is not pregnant

## 2023-04-25 ENCOUNTER — Encounter: Payer: Self-pay | Admitting: Family Medicine

## 2023-04-26 ENCOUNTER — Telehealth: Payer: Self-pay

## 2023-04-26 ENCOUNTER — Other Ambulatory Visit: Payer: Self-pay | Admitting: Family Medicine

## 2023-04-26 MED ORDER — CLONAZEPAM 0.5 MG PO TABS: ORAL_TABLET | ORAL | 0 refills | Status: DC

## 2023-04-26 NOTE — Telephone Encounter (Signed)
Pt called in to check on status of this refill  clonazePAM (KLONOPIN) 0.5 MG tablet [[id number]]  LOV: 07/13/22  PHARMACY: [id number] [place] Otto, [place] - [address] AT [facility] OF [address], [place] [address] Phone: [phone]  Fax: [phone]  CB#: [phone]

## 2023-05-13 ENCOUNTER — Other Ambulatory Visit (HOSPITAL_BASED_OUTPATIENT_CLINIC_OR_DEPARTMENT_OTHER): Payer: Self-pay | Admitting: Cardiology

## 2023-05-13 DIAGNOSIS — I493 Ventricular premature depolarization: Secondary | ICD-10-CM

## 2023-05-13 DIAGNOSIS — I429 Cardiomyopathy, unspecified: Secondary | ICD-10-CM

## 2023-05-20 ENCOUNTER — Other Ambulatory Visit: Payer: Self-pay | Admitting: Physician Assistant

## 2023-05-21 NOTE — Telephone Encounter (Signed)
Last Fill: 02/21/2023  Labs: 02/07/2023  TB Gold: 04/18/2022 Neg    Next Visit: 08/07/2023  Last Visit: 03/07/2023  DX: Rheumatoid arthritis of multiple sites with negative rheumatoid factor   Current Dose per office note 03/07/2023: Rinvoq 15 mg 1 tablet by mouth daily   Patient advised she is due to update labs. Patient will update labs this week.   Okay to refill Rinvoq?

## 2023-05-28 ENCOUNTER — Other Ambulatory Visit: Payer: Self-pay | Admitting: Family Medicine

## 2023-05-29 ENCOUNTER — Other Ambulatory Visit: Payer: Self-pay | Admitting: Acute Care

## 2023-05-29 DIAGNOSIS — Z87891 Personal history of nicotine dependence: Secondary | ICD-10-CM

## 2023-05-29 DIAGNOSIS — Z122 Encounter for screening for malignant neoplasm of respiratory organs: Secondary | ICD-10-CM

## 2023-05-29 NOTE — Telephone Encounter (Signed)
Requested medication (s) are due for refill today -yes  Requested medication (s) are on the active medication list -yes  Future visit scheduled no  Last refill: 04/26/23 #60  Notes to clinic: non delegated Rx  Requested Prescriptions  Pending Prescriptions Disp Refills   clonazePAM (KLONOPIN) 0.5 MG tablet [Pharmacy Med Name: CLONAZEPAM 0.5MG  TABLETS] 60 tablet     Sig: TAKE 1 TABLET BY MOUTH TWICE DAILY AS NEEDED     Not Delegated - Psychiatry: Anxiolytics/Hypnotics 2 Failed - 05/28/2023 12:38 PM      Failed - This refill cannot be delegated      Failed - Urine Drug Screen completed in last 360 days      Failed - Valid encounter within last 6 months    Recent Outpatient Visits           1 year ago Encounter for screening mammogram for malignant neoplasm of breast   [facility] Donita Brooks, MD   1 year ago Benign paroxysmal positional vertigo, unspecified laterality   [facility] Donita Brooks, MD   2 years ago Encounter for tobacco use cessation counseling   [facility] Donita Brooks, MD   2 years ago Rheumatoid arthritis involving multiple sites, unspecified whether rheumatoid factor present (HCC)   [facility] Pickard, Priscille Heidelberg, MD   2 years ago Shortness of breath   [facility] Pickard, Priscille Heidelberg, MD       Future Appointments             In 2 months Deveshwar, Janalyn Rouse, MD [facility] - A Dept Of [place]            Passed - Patient is not pregnant         Requested Prescriptions  Pending Prescriptions Disp Refills   clonazePAM (KLONOPIN) 0.5 MG tablet [Pharmacy Med Name: CLONAZEPAM 0.5MG  TABLETS] 60 tablet     Sig: TAKE 1 TABLET BY MOUTH TWICE DAILY AS NEEDED     Not Delegated - Psychiatry: Anxiolytics/Hypnotics 2 Failed - 05/28/2023 12:38 PM      Failed - This refill cannot be delegated      Failed - Urine Drug Screen  completed in last 360 days      Failed - Valid encounter within last 6 months    Recent Outpatient Visits           1 year ago Encounter for screening mammogram for malignant neoplasm of breast   [facility] Donita Brooks, MD   1 year ago Benign paroxysmal positional vertigo, unspecified laterality   [facility] Donita Brooks, MD   2 years ago Encounter for tobacco use cessation counseling   [facility] Donita Brooks, MD   2 years ago Rheumatoid arthritis involving multiple sites, unspecified whether rheumatoid factor present (HCC)   [facility] Pickard, Priscille Heidelberg, MD   2 years ago Shortness of breath   [facility] Pickard, Priscille Heidelberg, MD       Future Appointments             In 2 months Deveshwar, Janalyn Rouse, MD [facility] - A Dept Of [place]            Passed - Patient is not pregnant

## 2023-05-30 ENCOUNTER — Other Ambulatory Visit (HOSPITAL_BASED_OUTPATIENT_CLINIC_OR_DEPARTMENT_OTHER): Payer: Self-pay | Admitting: Cardiology

## 2023-05-30 DIAGNOSIS — I493 Ventricular premature depolarization: Secondary | ICD-10-CM

## 2023-05-30 DIAGNOSIS — I429 Cardiomyopathy, unspecified: Secondary | ICD-10-CM

## 2023-06-04 ENCOUNTER — Other Ambulatory Visit: Payer: Self-pay | Admitting: *Deleted

## 2023-06-04 ENCOUNTER — Encounter: Payer: Self-pay | Admitting: Family Medicine

## 2023-06-04 DIAGNOSIS — Z9225 Personal history of immunosupression therapy: Secondary | ICD-10-CM

## 2023-06-04 DIAGNOSIS — Z111 Encounter for screening for respiratory tuberculosis: Secondary | ICD-10-CM

## 2023-06-04 DIAGNOSIS — Z79899 Other long term (current) drug therapy: Secondary | ICD-10-CM

## 2023-06-05 ENCOUNTER — Other Ambulatory Visit: Payer: Self-pay | Admitting: Family Medicine

## 2023-06-05 MED ORDER — CLONAZEPAM 0.5 MG PO TABS: ORAL_TABLET | ORAL | 0 refills | Status: DC

## 2023-06-05 NOTE — Progress Notes (Signed)
Creatinine is mildly elevated.  CBC is normal.  Patient should avoid all NSAIDs and increase water intake.

## 2023-06-06 LAB — CBC WITH DIFFERENTIAL/PLATELET
Absolute Lymphocytes: 2280 {cells}/uL (ref 850–3900)
Absolute Monocytes: 766 {cells}/uL (ref 200–950)
Basophils Absolute: 78 {cells}/uL (ref 0–200)
Basophils Relative: 0.8 %
Eosinophils Absolute: 68 {cells}/uL (ref 15–500)
Eosinophils Relative: 0.7 %
HCT: 41.8 % (ref 35.0–45.0)
Hemoglobin: 13.9 g/dL (ref 11.7–15.5)
MCH: 30.3 pg (ref 27.0–33.0)
MCHC: 33.3 g/dL (ref 32.0–36.0)
MCV: 91.3 fL (ref 80.0–100.0)
MPV: 11.6 fL (ref 7.5–12.5)
Monocytes Relative: 7.9 %
Neutro Abs: 6509 {cells}/uL (ref 1500–7800)
Neutrophils Relative %: 67.1 %
Platelets: 231 10*3/uL (ref 140–400)
RBC: 4.58 10*6/uL (ref 3.80–5.10)
RDW: 12.9 % (ref 11.0–15.0)
Total Lymphocyte: 23.5 %
WBC: 9.7 10*3/uL (ref 3.8–10.8)

## 2023-06-06 LAB — COMPLETE METABOLIC PANEL WITH GFR
AG Ratio: 1.7 (calc) (ref 1.0–2.5)
ALT: 20 U/L (ref 6–29)
AST: 25 U/L (ref 10–35)
Albumin: 4.2 g/dL (ref 3.6–5.1)
Alkaline phosphatase (APISO): 81 U/L (ref 37–153)
BUN/Creatinine Ratio: 12 (calc) (ref 6–22)
BUN: 14 mg/dL (ref 7–25)
CO2: 29 mmol/L (ref 20–32)
Calcium: 9.5 mg/dL (ref 8.6–10.4)
Chloride: 104 mmol/L (ref 98–110)
Creat: 1.13 mg/dL — ABNORMAL HIGH (ref 0.50–1.05)
Globulin: 2.5 g/dL (ref 1.9–3.7)
Glucose, Bld: 97 mg/dL (ref 65–99)
Potassium: 4.1 mmol/L (ref 3.5–5.3)
Sodium: 139 mmol/L (ref 135–146)
Total Bilirubin: 0.4 mg/dL (ref 0.2–1.2)
Total Protein: 6.7 g/dL (ref 6.1–8.1)
eGFR: 54 mL/min/{1.73_m2} — ABNORMAL LOW (ref >=60)

## 2023-06-06 LAB — QUANTIFERON-TB GOLD PLUS
Mitogen-NIL: 9.79 [IU]/mL
NIL: 0.01 [IU]/mL
QuantiFERON-TB Gold Plus: NEGATIVE
TB1-NIL: 0.01 [IU]/mL
TB2-NIL: 0.01 [IU]/mL

## 2023-06-06 NOTE — Progress Notes (Signed)
TB gold negative

## 2023-06-14 ENCOUNTER — Other Ambulatory Visit: Payer: Self-pay | Admitting: Cardiology

## 2023-06-16 ENCOUNTER — Other Ambulatory Visit: Payer: Self-pay | Admitting: Physician Assistant

## 2023-06-18 NOTE — Telephone Encounter (Signed)
Last Fill: 05/21/2023 (30 day supply)   Labs: 06/04/2023 Creatinine is mildly elevated.  CBC is normal.  Patient should avoid all NSAIDs and increase water intake.   TB Gold: 06/04/2023 negative    Next Visit: 08/07/2023  Last Visit: 03/07/2023  KZ:SWFUXNATFT arthritis of multiple sites with negative rheumatoid factor   Current Dose per office note on 03/07/2023: Rinvoq 15 mg 1 tablet by mouth daily   Okay to refill Rinvoq?

## 2023-06-19 ENCOUNTER — Other Ambulatory Visit: Payer: Self-pay | Admitting: Pulmonary Disease

## 2023-06-19 ENCOUNTER — Encounter: Payer: Self-pay | Admitting: Family Medicine

## 2023-06-19 ENCOUNTER — Ambulatory Visit: Payer: 59 | Admitting: Family Medicine

## 2023-06-19 VITALS — BP 126/78 | HR 89 | Temp 98.3°F | Ht 66.0 in | Wt 182.6 lb

## 2023-06-19 DIAGNOSIS — Z716 Tobacco abuse counseling: Secondary | ICD-10-CM

## 2023-06-19 DIAGNOSIS — Z23 Encounter for immunization: Secondary | ICD-10-CM | POA: Diagnosis not present

## 2023-06-19 MED ORDER — VARENICLINE TARTRATE (STARTER) 0.5 MG X 11 & 1 MG X 42 PO TBPK: ORAL_TABLET | ORAL | 0 refills | Status: DC

## 2023-06-19 MED ORDER — VARENICLINE TARTRATE 1 MG PO TABS: 1.0000 mg | ORAL_TABLET | Freq: Two times a day (BID) | ORAL | 5 refills | Status: DC

## 2023-06-19 NOTE — Progress Notes (Signed)
Subjective:    Patient ID: Courtney Myers, female    DOB: [birth year], 62 y.o.   MRN: [id number]  Nicotine Dependence Her urge triggers include company of smokers.    The patient quit smoking approximately 1 year ago.  In May of this year, the patient states she "fell off the wagon".  She resumed smoking.  Her husband resumed smoking.  She is now ready to try to quit again.  She would like to try Chantix.  She suffered no side effects the first time she took the medication.  It assisted her in quitting.  She is also due for a flu shot as well as capvaxive Past Medical History:  Diagnosis Date   Anxiety    Cervical dystonia    COPD (chronic obstructive pulmonary disease) (HCC) 10/2020   Depression    Fibromyalgia    GERD (gastroesophageal reflux disease)    History of shingles    RA (rheumatoid arthritis) (HCC)    Synovitis of hand    Vertigo    Past Surgical History:  Procedure Laterality Date   COLONOSCOPY  10/25/2009   TONSILLECTOMY     VAGINAL HYSTERECTOMY  09/01/2006   Current Outpatient Medications on File Prior to Visit  Medication Sig Dispense Refill   AbobotulinumtoxinA (DYSPORT) 500 units SOLR injection INJECT 500 UNITS  INTRAMUSCULARLY EVERY 3  MONTHS (GIVEN AT MD OFFICE, DISCARD UNUSED) 1 each 3   aspirin EC 81 MG tablet Take 1 tablet (81 mg total) by mouth daily. Swallow whole. 90 tablet 3   clonazePAM (KLONOPIN) 0.5 MG tablet TAKE 1 TABLET BY MOUTH TWICE DAILY AS NEEDED 60 tablet 0   DULoxetine (CYMBALTA) 60 MG capsule TAKE 1 CAPSULE(60 MG) BY MOUTH DAILY 30 capsule 2   Fluticasone-Umeclidin-Vilant (TRELEGY ELLIPTA) 200-62.5-25 MCG/ACT AEPB Inhale 1 puff into the lungs daily. 180 each 3   metoprolol succinate (TOPROL-XL) 25 MG 24 hr tablet TAKE 1 TABLET(25 MG) BY MOUTH DAILY 15 tablet 0   Multiple Vitamins-Minerals (MULTIVITAMINS THER. W/MINERALS) TABS Take 1 tablet by mouth at bedtime.      omeprazole (PRILOSEC) 20 MG capsule TAKE 1 CAPSULE BY MOUTH DAILY 60  capsule 5   RINVOQ 15 MG TB24 TAKE 1 TABLET BY MOUTH DAILY 90 tablet 0   rosuvastatin (CRESTOR) 20 MG tablet Take 1 tablet (20 mg total) by mouth daily. PATIENT NEEDS APPOINTMENT FOR FURTHER REFILLS. 30 tablet 0   traMADol (ULTRAM) 50 MG tablet Take 1 - 2 every 8 hours as needed for pain. 30 tablet 0   Xylitol (XYLIMELTS MT) Use as directed 2 tablets in the mouth or throat at bedtime as needed (dry mouth).      Current Facility-Administered Medications on File Prior to Visit  Medication Dose Route Frequency Provider Last Rate Last Admin   0.9 %  sodium chloride infusion  500 mL Intravenous Once Nandigam, Eleonore Chiquito, MD       No Known Allergies Social History   Socioeconomic History   Marital status: Married    Spouse name: Harvie Heck   Number of children: 2   Years of education: 12   Highest education level: 12th grade  Occupational History    Employer: [employer]  Tobacco Use   Smoking status: Former    Current packs/day: 0.00    Average packs/day: 0.3 packs/day for 43.4 years (10.8 ttl pk-yrs)    Types: Cigarettes    Start date: 44    Quit date: 11/16/2020    Years since quitting: 2.5  Passive exposure: Never   Smokeless tobacco: Never  Vaping Use   Vaping status: Never Used  Substance and Sexual Activity   Alcohol use: Yes    Alcohol/week: 0.0 standard drinks of alcohol    Comment: Rare, twice per year   Drug use: Never   Sexual activity: Yes    Birth control/protection: Surgical    Comment: 1st intercourse 62 yo-Fewer than 5 partners  Other Topics Concern   Not on file  Social History Narrative   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 Right handed.    Caffeine- 4 cups daily.   Social Drivers of Corporate investment banker Strain: Low Risk  (06/18/2023)   Overall Financial Resource Strain (CARDIA)    Difficulty of Paying Living Expenses: Not very hard  Food Insecurity: No Food  Insecurity (06/18/2023)   Hunger Vital Sign    Worried About Running Out of Food in the Last Year: Never true    Ran Out of Food in the Last Year: Never true  Transportation Needs: No Transportation Needs (06/18/2023)   PRAPARE - Administrator, Civil Service (Medical): No    Lack of Transportation (Non-Medical): No  Physical Activity: Insufficiently Active (06/18/2023)   Exercise Vital Sign    Days of Exercise per Week: 5 days    Minutes of Exercise per Session: 10 min  Stress: No Stress Concern Present (06/18/2023)   [employer] of Occupational Health - Occupational Stress Questionnaire    Feeling of Stress : Only a little  Social Connections: Moderately Integrated (06/18/2023)   Social Connection and Isolation Panel [NHANES]    Frequency of Communication with Friends and Family: More than three times a week    Frequency of Social Gatherings with Friends and Family: Once a week    Attends Religious Services: 1 to 4 times per year    Active Member of [employer] or Organizations: No    Attends Engineer, structural: Not on file    Marital Status: Married  Catering manager Violence: Not on file    The patient smokes.  She has a history of coronary artery disease.  Her father had numerous stents and bypasses starting in his 64s.  She had a paternal uncle who had stents in his 6s.  Her paternal grandfather also had stents and bypasses.  Therefore coronary artery disease is strong and the female members on her father side of the family usually in the late 24s or early 5s.   Review of Systems  All other systems reviewed and are negative.      Objective:   Physical Exam Vitals reviewed.  Constitutional:      General: She is not in acute distress.    Appearance: Normal appearance. She is obese. She is not ill-appearing or toxic-appearing.  HENT:     Head: Normocephalic and atraumatic.     Right Ear: Tympanic membrane and ear canal normal.     Left Ear: Tympanic  membrane and ear canal normal.     Nose: Nose normal. No congestion or rhinorrhea.     Mouth/Throat:     Mouth: Mucous membranes are moist.     Pharynx: Oropharynx is clear. No oropharyngeal exudate or posterior oropharyngeal erythema.  Eyes:     Extraocular Movements: Extraocular movements intact.     Conjunctiva/sclera: Conjunctivae normal.     Pupils: Pupils are equal, round, and reactive to light.  Neck:  Vascular: No carotid bruit.  Cardiovascular:     Rate and Rhythm: Normal rate and regular rhythm.     Pulses: Normal pulses.     Heart sounds: Normal heart sounds. No murmur heard.    No friction rub. No gallop.  Pulmonary:     Effort: Pulmonary effort is normal. No respiratory distress.     Breath sounds: Normal breath sounds. No stridor. No wheezing, rhonchi or rales.  Chest:     Chest wall: No tenderness or crepitus.  Abdominal:     General: Abdomen is flat. Bowel sounds are normal. There is no distension.     Palpations: Abdomen is soft.     Tenderness: There is no abdominal tenderness. There is no guarding or rebound.     Hernia: No hernia is present.  Musculoskeletal:        General: No swelling, tenderness, deformity or signs of injury.     Cervical back: Normal range of motion and neck supple. No rigidity.     Right lower leg: No edema.     Left lower leg: No edema.  Lymphadenopathy:     Cervical: No cervical adenopathy.  Skin:    General: Skin is warm.     Coloration: Skin is not jaundiced.     Findings: No bruising, erythema or lesion.  Neurological:     General: No focal deficit present.     Mental Status: She is alert and oriented to person, place, and time. Mental status is at baseline.     Cranial Nerves: No cranial nerve deficit.     Sensory: No sensory deficit.     Motor: No weakness.     Coordination: Coordination normal.     Gait: Gait normal.     Deep Tendon Reflexes: Reflexes normal.  Psychiatric:        Mood and Affect: Mood normal.         Behavior: Behavior normal.        Thought Content: Thought content normal.        Judgment: Judgment normal.           Assessment & Plan:  Encounter for smoking cessation counseling Begin Chantix starter pack.  In 1 month, begin Chantix continuing pack.  Recommended strategies to help quit smoking.  The patient received her flu shot today as well as the new pneumonia vaccine.

## 2023-06-19 NOTE — Addendum Note (Signed)
Addended by: Venia Carbon K on: 06/19/2023 04:42 PM   Modules accepted: Orders

## 2023-06-20 ENCOUNTER — Ambulatory Visit: Payer: 59 | Admitting: Neurology

## 2023-06-20 ENCOUNTER — Other Ambulatory Visit: Payer: Self-pay | Admitting: Physician Assistant

## 2023-06-20 VITALS — BP 120/77 | HR 92

## 2023-06-20 DIAGNOSIS — G243 Spasmodic torticollis: Secondary | ICD-10-CM

## 2023-06-20 MED ORDER — ABOBOTULINUMTOXINA 500 UNITS IM SOLR
500.0000 [IU] | Freq: Once | INTRAMUSCULAR | Status: AC
  Administered 2023-06-20: 500 [IU] via INTRAMUSCULAR

## 2023-06-20 NOTE — Progress Notes (Signed)
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  Symptom onset was without apparent triggering event, she has gradually developed this neck pulling,  manifested primarily with right laterocollis, mild retrocolis, and left shoulder elevation since 2000. She has good relief with regular Botox injection, about 4 times a year since 2003. Last injection was February 18, 2010 by Dr. Deneen Harts,  She reported great relief as usual, taking 4- 5 days to get symptom relieve, lasting about 3 months.  I began to inject her since 12.2011, every 3-4 months, dosage has been limited to 150 units of BOTOX A.  She denies neck pain, gait difficulty, but with wearing off Botox, she noticed increased head tremor, and pulling towards her right shoulder.  She developed  transient difficulty lifting her neck up when using BOTOX A 200 units previously, reponding well to decreased dose of 150 units  She was enrolled into IPSEN dysport study, first injection was in April 30th 2013, she later enrolled into open label, 2nd injection was in  May 28th 2013. She did very well.  Recent few weeks,  She had experienced flareup of her rheumatoid arthritis, has received IV infusion. Last injection was in 06/10/2012,  500 units of dysport was dissolved into 2 cc of normal saline. Right longissimus capitus 0.5 cc Right splenius capitis 0.5 cc Right levator scapular 0.5 cc Right iliocostalis 0.5 cc   Dysport research study has completed, she is now coming back for repeat injection, she noticed head bobbing over the past few weeks, she denies significant neck pain, weakness,  Her rheumatoid arthritis is under better control   Last injection was in Sep 2014, she did very well, no neck extension weakness, only rarely neck shaking  UPDATE March 8th 2016: She has lost follow-up since her last  visit December 2014, She came in for treatment her cervical dystonia, she feel the tension of her neck all the time, stiffness. Denied gait difficulty, last injection was September 2014, she received 500 units of Dysport, works well for her, she is continue on medication for anxiety, rheumatoid arthritis She also complains of nighttime snoring, frequent awakening, excessive daytime sleepiness, fatigue, today's ESS is 6, FSS was 39,  UPDATE October 13 2014:   She has missed her scheduled sleep study, continue have worsening neck posturing, posterior neck pain, excessive daytime fatigue and sleepiness  UPDATE Nov 11 2014: She came in for EMG guided Dysport injection today, last injection was December 2014, she complains of worsening head titubation, bilateral hands mild postural tremor, posterior neck pain,  Update February 17 2015 She responded very well to last EMG guided Dysport injection Nov 11 2014, no significant head titubation, no significant neck pain  Update July 22 2015: Last injection was August, now she noticed returning of neck pulling and shaking, posterior neck muscle achy pain  Update October 21 2015: Last EMG guided Dysport injection was in January, she responded very well, only recent couple weeks, she noticed returning of neck pulling, shaking again, She has worsening rheumatoid arthritis, is receiving more frequent treatment, complains fatigue, diffuse body achy pain  Update March 15 2016: She responded very well to previous Dysport injection in April, no significant side effect noticed, only recent few weeks, 5 months from previous injection, she noticed return of head shaking neck muscle tightness.  UPDATE Dec 14th 2017: She responded very well  to previous injection in Sept 2017, no significant side effect noticed.  Update September 13 2016: She did well this last injection in December  Update January 15 2017: She noticed worsening head tremor, she had excessive stress,  her sister passed away from lung cancer in April 2018,  UPDATE Apr 25 2017: She responded very well with previous injection, barely has noticeable head shaking.  Update August 01, 2017: She responded very well to previous dysport injection, only recently began to have recurrent head shaking  UPDATE December 19 2017: She responded well to previous injection  UPDATE Sept 25 2019: She responded very well to previous injection  Update February 2022 2023  She responded well to previous injection, her rheumatoid arthritis is under excellent control,  She reported sudden onset of acute vertigo on July 22, 2020 turning to the right side, nausea, unsteady gait at the beginning of the onset, over the past few weeks, her symptoms has improved, but with sudden positional change, especially turning towards the right side, it will trigger transient dizziness, she denies hearing loss.  REVIEW OF SYSTEMS: Full 14 system review of systems performed and notable only for ear pain, joint pain, joint swelling, achy muscles, muscle cramps, rash, tremors, depression, anxiety  ALLERGIES: No Known Allergies  HOME MEDICATIONS: Current Outpatient Medications  Medication Sig Dispense Refill   AbobotulinumtoxinA (DYSPORT) 500 units SOLR injection INJECT 500 UNITS  INTRAMUSCULARLY EVERY 3  MONTHS (GIVEN AT MD OFFICE, DISCARD UNUSED) 1 each 3   aspirin EC 81 MG tablet Take 1 tablet (81 mg total) by mouth daily. Swallow whole. 90 tablet 3   clonazePAM (KLONOPIN) 0.5 MG tablet TAKE 1 TABLET BY MOUTH TWICE DAILY AS NEEDED 60 tablet 0   DULoxetine (CYMBALTA) 60 MG capsule TAKE 1 CAPSULE(60 MG) BY MOUTH DAILY 30 capsule 2   Fluticasone-Umeclidin-Vilant (TRELEGY ELLIPTA) 200-62.5-25 MCG/ACT AEPB Inhale 1 puff into the lungs daily. 180 each 3   metoprolol succinate (TOPROL-XL) 25 MG 24 hr tablet TAKE 1 TABLET(25 MG) BY MOUTH DAILY 15 tablet 0   Multiple Vitamins-Minerals (MULTIVITAMINS THER. W/MINERALS) TABS Take 1  tablet by mouth at bedtime.      omeprazole (PRILOSEC) 20 MG capsule TAKE 1 CAPSULE BY MOUTH DAILY 60 capsule 5   RINVOQ 15 MG TB24 TAKE 1 TABLET BY MOUTH DAILY 90 tablet 0   rosuvastatin (CRESTOR) 20 MG tablet Take 1 tablet (20 mg total) by mouth daily. PATIENT NEEDS APPOINTMENT FOR FURTHER REFILLS. 30 tablet 0   traMADol (ULTRAM) 50 MG tablet Take 1 - 2 every 8 hours as needed for pain. 30 tablet 0   varenicline (CHANTIX CONTINUING MONTH PAK) 1 MG tablet Take 1 tablet (1 mg total) by mouth 2 (two) times daily. 60 tablet 5   Varenicline Tartrate, Starter, (CHANTIX STARTING MONTH PAK) 0.5 MG X 11 & 1 MG X 42 TBPK Take 0.5 mg tablet by mouth 1x daily for 3 days, then increase to 0.5 mg tablet 2x daily for 4 days, then increase to 1 mg tablet 2x daily. 1 each 0   Xylitol (XYLIMELTS MT) Use as directed 2 tablets in the mouth or throat at bedtime as needed (dry mouth).      Current Facility-Administered Medications  Medication Dose Route Frequency Provider Last Rate Last Admin   0.9 %  sodium chloride infusion  500 mL Intravenous Once Nandigam, Kavitha V, MD       AbobotulinumtoxinA (DYSPORT) 500 units injection 500 Units  500 Units Intramuscular Once Levert Feinstein, MD  PAST MEDICAL HISTORY: Past Medical History:  Diagnosis Date   Anxiety    Cervical dystonia    COPD (chronic obstructive pulmonary disease) (HCC) 10/2020   Depression    Fibromyalgia    GERD (gastroesophageal reflux disease)    History of shingles    RA (rheumatoid arthritis) (HCC)    Synovitis of hand    Vertigo     PAST SURGICAL HISTORY: Past Surgical History:  Procedure Laterality Date   COLONOSCOPY  10/25/2009   TONSILLECTOMY     VAGINAL HYSTERECTOMY  09/01/2006    FAMILY HISTORY: Family History  Problem Relation Age of Onset   Arthritis Mother    Heart Problems Father    Heart disease Father    Heart disease Paternal Uncle        MI   Colon cancer Neg Hx    Colon polyps Neg Hx    Esophageal cancer  Neg Hx    Rectal cancer Neg Hx    Stomach cancer Neg Hx     SOCIAL HISTORY:  Social History   Socioeconomic History   Marital status: Married    Spouse name: Harvie Heck   Number of children: 2   Years of education: 12   Highest education level: 12th grade  Occupational History    Employer: [employer]  Tobacco Use   Smoking status: Former    Current packs/day: 0.00    Average packs/day: 0.3 packs/day for 43.4 years (10.8 ttl pk-yrs)    Types: Cigarettes    Start date: 9    Quit date: 11/16/2020    Years since quitting: 2.5    Passive exposure: Never   Smokeless tobacco: Never  Vaping Use   Vaping status: Never Used  Substance and Sexual Activity   Alcohol use: Yes    Alcohol/week: 0.0 standard drinks of alcohol    Comment: Rare, twice per year   Drug use: Never   Sexual activity: Yes    Birth control/protection: Surgical    Comment: 1st intercourse 62 yo-Fewer than 5 partners  Other Topics Concern   Not on file  Social History Narrative   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 Right handed.    Caffeine- 4 cups daily.   Social Drivers of Corporate investment banker Strain: Low Risk  (06/18/2023)   Overall Financial Resource Strain (CARDIA)    Difficulty of Paying Living Expenses: Not very hard  Food Insecurity: No Food Insecurity (06/18/2023)   Hunger Vital Sign    Worried About Running Out of Food in the Last Year: Never true    Ran Out of Food in the Last Year: Never true  Transportation Needs: No Transportation Needs (06/18/2023)   PRAPARE - Administrator, Civil Service (Medical): No    Lack of Transportation (Non-Medical): No  Physical Activity: Insufficiently Active (06/18/2023)   Exercise Vital Sign    Days of Exercise per Week: 5 days    Minutes of Exercise per Session: 10 min  Stress: No Stress Concern Present (06/18/2023)   [employer] of  Occupational Health - Occupational Stress Questionnaire    Feeling of Stress : Only a little  Social Connections: Moderately Integrated (06/18/2023)   Social Connection and Isolation Panel [NHANES]    Frequency of Communication with Friends and Family: More than three times a week    Frequency of Social Gatherings with Friends and Family: Once a week  Attends Religious Services: 1 to 4 times per year    Active Member of Clubs or Organizations: No    Attends Engineer, structural: Not on file    Marital Status: Married  Catering manager Violence: Not on file     PHYSICAL EXAM   Vitals:   06/20/23 0958  BP: 120/77  Pulse: 92     Gen: NAD, conversant, well nourised, well groomed             NEUROLOGICAL EXAM:  MENTAL STATUS: Speech/cognition: Awake, alert, oriented to history taking and casual conversation   CRANIAL NERVES: She has mild retrocollis, right tilt, slight left shoulder elevation CN II: Visual fields are full to confrontation.  Pupils are round equal and briskly reactive to light. CN III, IV, VI: extraocular movement are normal. No ptosis. CN V: Facial sensation is intact to pinprick in all 3 divisions bilaterally. Corneal responses are intact.  CN VII: Face is symmetric with normal eye closure and smile. CN VIII: Hearing is normal to casual conversation CN IX, X: Palate elevates symmetrically. Phonation is normal. CN XI: Head turning and shoulder shrug are intact  MOTOR: There is no pronator drift of out-stretched arms. Muscle bulk and tone are normal. Muscle strength is normal.  COORDINATION: Rapid alternating movements and fine finger movements are intact. There is no dysmetria on finger-to-nose and heel-knee-shin.    GAIT/STANCE: Posture is normal. Gait is steady    I performed Epley's maneuver, right ear dependent position after short latency will trigger rotatory downward beating nystagmus, habituate quickly, on the second repeat Epley's  maneuver, the nystagmus and her vertigo sensation has much improved  DIAGNOSTIC DATA (LABS, IMAGING, TESTING) - I reviewed patient records, labs, notes, testing and imaging myself where available.  ASSESSMENT AND PLAN  Alexiea Rossetti is a 62 y.o. female with long-standing history of cervical dystonia, responded very well to previous EMG guided Dysport injection, last injection was May 2016, responded very well.   Mild retrocollis, right tilt, slight left shoulder elevation  500 of Dysport was dissolved into 2.5 cc of normal saline,  Right longissimus capitus 0. 5 cc Left splenius capitis 0.5 cc Right inferior oblique capitis, 0.5 cc  Left inferior oblique capitis 0.5 cc Left levator scapular 0.5   Benign positional vertigo  Symptom onset since July 22, 2021,  I performed Epley maneuver, right ear dependent position will trigger downward beating rotatory nystagmus, quickly improved after 2 repeat procedures   Levert Feinstein, M.D. Ph.D.  [facility] [address] [place], [place] [address] Ph: [phone] Fax: [phone]      PATIENT: Hasini Flitton DOB: 1961-03-06  HISTORICAL  Aleanna Surrency  is a 62 year-old right-handed Caucasian female, came in for EMG guided Botox for cervical dystonia.  Symptom onset was without apparent triggering event, she has gradually developed this neck pulling,  manifested primarily with right laterocollis, mild retrocolis, and left shoulder elevation since 2000. She has good relief with regular Botox injection, about 4 times a year since 2003. Last injection was February 18, 2010 by Dr. Deneen Harts,  She reported great relief as usual, taking 4- 5 days to get symptom relieve, lasting about 3 months.  I began to inject her since 12.2011, every 3-4 months, dosage has been limited to 150 units of BOTOX A.  She denies neck pain, gait difficulty, but with wearing off Botox, she noticed increased  head tremor, and pulling towards her right shoulder.  She  developed  transient difficulty lifting her neck up when using BOTOX A 200 units previously, reponding well to decreased dose of 150 units  She was enrolled into IPSEN dysport study, first injection was in April 30th 2013, she later enrolled into open label, 2nd injection was in  May 28th 2013. She did very well.  Recent few weeks,  She had experienced flareup of her rheumatoid arthritis, has received IV infusion. Last injection was in 06/10/2012,  500 units of dysport was dissolved into 2 cc of normal saline. Right longissimus capitus 0.5 cc Right splenius capitis 0.5 cc Right levator scapular 0.5 cc Right iliocostalis 0.5 cc   Dysport research study has completed, she is now coming back for repeat injection, she noticed head bobbing over the past few weeks, she denies significant neck pain, weakness,  Her rheumatoid arthritis is under better control   Last injection was in Sep 2014, she did very well, no neck extension weakness, only rarely neck shaking  UPDATE March 8th 2016: She has lost follow-up since her last visit December 2014, She came in for treatment her cervical dystonia, she feel the tension of her neck all the time, stiffness. Denied gait difficulty, last injection was September 2014, she received 500 units of Dysport, works well for her, she is continue on medication for anxiety, rheumatoid arthritis She also complains of nighttime snoring, frequent awakening, excessive daytime sleepiness, fatigue, today's ESS is 6, FSS was 90,  UPDATE October 13 2014:   She has missed her scheduled sleep study, continue have worsening neck posturing, posterior neck pain, excessive daytime fatigue and sleepiness  UPDATE Nov 11 2014: She came in for EMG guided Dysport injection today, last injection was December 2014, she complains of worsening head titubation, bilateral hands mild postural tremor, posterior neck pain,  Update February 17 2015 She responded very well to last EMG guided Dysport injection Nov 11 2014, no significant head titubation, no significant neck pain  Update July 22 2015: Last injection was August, now she noticed returning of neck pulling and shaking, posterior neck muscle achy pain  Update October 21 2015: Last EMG guided Dysport injection was in January, she responded very well, only recent couple weeks, she noticed returning of neck pulling, shaking again, She has worsening rheumatoid arthritis, is receiving more frequent treatment, complains fatigue, diffuse body achy pain  Update March 15 2016: She responded very well to previous Dysport injection in April, no significant side effect noticed, only recent few weeks, 5 months from previous injection, she noticed return of head shaking neck muscle tightness.  UPDATE Dec 14th 2017: She responded very well to previous injection in Sept 2017, no significant side effect noticed.  Update September 13 2016: She did well this last injection in December  Update January 15 2017: She noticed worsening head tremor, she had excessive stress, her sister passed away from lung cancer in April 2018,  UPDATE Apr 25 2017: She responded very well with previous injection, barely has noticeable head shaking.  Update August 01, 2017: She responded very well to previous dysport injection, only recently began to have recurrent head shaking  UPDATE December 19 2017: She responded well to previous injection  UPDATE Jun 20 2018: She did very well with previous injection  UPDATE October 01 2018: She did well to previous injection  UPDATE January 01 2019: She did well with previous injection  UPDATE Oct 28th 2020: She did well to previous injections  UPDATE Jan 27th 2021: She did well  to previous injections  Update October 29, 2019: She did well with previous injection, no significant side effect noted.  Update February 04, 2020, Uses Dysport 500 units, responding  well  UPDATE Jun 02 2020: Uses Dysport 500 units, responding well  Update September 28, 2020: She responded well to previous injection, unfortunately she has been a longtime smoker, diagnosed with COPD, chest x-ray showed bibasilar atelectasis, and infiltration, small left pleural effusion on September 08, 2020  UPDATE December 01 2021: She did well with his previous injection,  UPDATE September 12 2021: She retired the end of October 2023, neck overall doing well well, only occasionally shaking, muscle tension  UPDATE January 01 2023: She responded well to previous injection  UPDATE Jun 20 2023: She responded well to previous injection no significant side effect noted,  PHYSICAL EXAM   Today's Vitals   06/20/23 0958  BP: 120/77  Pulse: 92    PHYSICAL EXAMNIATION:   Mild retrocollis, mild right tilt,\with mild left shoulder elevation  ASSESSMENT AND PLAN  Maiesha Tuckerman is a 62 y.o. female with long-standing history of cervical dystonia, responded very well to EMG guided Dysport injection,  Mild retrocollis, right tilt, mild left shoulder elevation, occasionally no-no titubation ( more muscle activities on left paraspinal muscles)  500 of Dysport was dissolved into 2.5 cc of normal saline,  Right splenius capitus 0.25 cc Right sternocleidomastoid 0.25 cc  Left splenius cervix 05x2=1.0 cc Left splenius capitis 0.5 Left levator scapular 0. 5 cc   Patient tolerate the injection well, will return to clinic in 3 months for repeat injection Please dissolve 500 units of Dysport into 2.5 cc of normal saline   Levert Feinstein, M.D. Ph.D.  [facility] [address] [place], [place] [address] Ph: [phone] Fax: [phone]

## 2023-06-20 NOTE — Progress Notes (Signed)
Dysport 500units x 1 vial  [id number] Exp-08/30/24 B/B  Bacteriostatic 0.9% Sodium Chloride- 2.5 mL  Lot: [id number] Expiration: 05/03/24 NDC:  [id number] Dx:   G24.3   WITNESSED [id number] CMA

## 2023-06-25 ENCOUNTER — Ambulatory Visit: Payer: 59 | Admitting: Family Medicine

## 2023-07-05 ENCOUNTER — Ambulatory Visit
Admission: RE | Admit: 2023-07-05 | Discharge: 2023-07-05 | Disposition: A | Discharge status: Home / Self Care | Payer: 59 | Source: Ambulatory Visit | Attending: Acute Care | Admitting: Acute Care

## 2023-07-05 DIAGNOSIS — Z87891 Personal history of nicotine dependence: Secondary | ICD-10-CM

## 2023-07-05 DIAGNOSIS — Z122 Encounter for screening for malignant neoplasm of respiratory organs: Secondary | ICD-10-CM

## 2023-07-06 ENCOUNTER — Other Ambulatory Visit: Payer: Self-pay | Admitting: Family Medicine

## 2023-07-09 NOTE — Telephone Encounter (Signed)
 Requested medication (s) are due for refill today - yes  Requested medication (s) are on the active medication list -yes  Future visit scheduled -no  Last refill: 06/05/23 #60  Notes to clinic: non delegated Rx  Requested Prescriptions  Pending Prescriptions Disp Refills   clonazePAM  (KLONOPIN ) 0.5 MG tablet [Pharmacy Med Name: CLONAZEPAM  0.5MG  TABLETS] 60 tablet     Sig: TAKE 1 TABLET BY MOUTH TWICE DAILY AS NEEDED     Not Delegated - Psychiatry: Anxiolytics/Hypnotics 2 Failed - 07/09/2023  9:32 AM      Failed - This refill cannot be delegated      Failed - Urine Drug Screen completed in last 360 days      Failed - Valid encounter within last 6 months    Recent Outpatient Visits           1 year ago Encounter for screening mammogram for malignant neoplasm of breast   [facility] [name], MD   1 year ago Benign paroxysmal positional vertigo, unspecified laterality   [facility] [name], MD   2 years ago Encounter for tobacco use cessation counseling   [facility] [name], MD   2 years ago Rheumatoid arthritis involving multiple sites, unspecified whether rheumatoid factor present [facility])   [facility] Pickard, Butler DASEN, MD   3 years ago Shortness of breath   [facility] Pickard, Butler DASEN, MD       Future Appointments             In 1 week Lonni Slain, MD [facility] at [facility], [place]   In 4 weeks Dolphus Reiter, MD [facility]ology - A Dept Of [place]. [facility]            Passed - Patient is not pregnant         Requested Prescriptions  Pending Prescriptions Disp Refills   clonazePAM  (KLONOPIN ) 0.5 MG tablet [Pharmacy Med Name: CLONAZEPAM  0.5MG  TABLETS] 60 tablet     Sig: TAKE 1 TABLET BY MOUTH TWICE DAILY AS NEEDED     Not Delegated - Psychiatry: Anxiolytics/Hypnotics 2 Failed - 07/09/2023   9:32 AM      Failed - This refill cannot be delegated      Failed - Urine Drug Screen completed in last 360 days      Failed - Valid encounter within last 6 months    Recent Outpatient Visits           1 year ago Encounter for screening mammogram for malignant neoplasm of breast   [facility] [name], MD   1 year ago Benign paroxysmal positional vertigo, unspecified laterality   [facility] [name], MD   2 years ago Encounter for tobacco use cessation counseling   [facility] [name], MD   2 years ago Rheumatoid arthritis involving multiple sites, unspecified whether rheumatoid factor present [facility])   [facility] Pickard, Butler DASEN, MD   3 years ago Shortness of breath   [facility] Pickard, Butler DASEN, MD       Future Appointments             In 1 week Lonni Slain, MD [facility] & [facility] at [facility], [place]   In 4 weeks Dolphus Reiter, MD [facility] Rheumatology - A  Dept Of [place]. [facility]            Passed - Patient is not pregnant

## 2023-07-11 ENCOUNTER — Other Ambulatory Visit: Payer: Self-pay | Admitting: Family Medicine

## 2023-07-12 ENCOUNTER — Other Ambulatory Visit: Payer: Self-pay | Admitting: Family Medicine

## 2023-07-12 ENCOUNTER — Encounter: Payer: Self-pay | Admitting: Family Medicine

## 2023-07-12 MED ORDER — CLONAZEPAM 0.5 MG PO TABS: ORAL_TABLET | ORAL | 0 refills | Status: DC

## 2023-07-16 ENCOUNTER — Other Ambulatory Visit: Payer: Self-pay | Admitting: Cardiology

## 2023-07-17 ENCOUNTER — Encounter (HOSPITAL_BASED_OUTPATIENT_CLINIC_OR_DEPARTMENT_OTHER): Payer: Self-pay | Admitting: Cardiology

## 2023-07-17 ENCOUNTER — Ambulatory Visit (HOSPITAL_BASED_OUTPATIENT_CLINIC_OR_DEPARTMENT_OTHER): Payer: 59 | Admitting: Cardiology

## 2023-07-17 ENCOUNTER — Telehealth: Payer: Self-pay | Admitting: Acute Care

## 2023-07-17 VITALS — BP 102/64 | HR 75 | Ht 66.0 in | Wt 183.0 lb

## 2023-07-17 DIAGNOSIS — Z8249 Family history of ischemic heart disease and other diseases of the circulatory system: Secondary | ICD-10-CM

## 2023-07-17 DIAGNOSIS — I493 Ventricular premature depolarization: Secondary | ICD-10-CM | POA: Diagnosis not present

## 2023-07-17 DIAGNOSIS — I447 Left bundle-branch block, unspecified: Secondary | ICD-10-CM | POA: Diagnosis not present

## 2023-07-17 DIAGNOSIS — Z122 Encounter for screening for malignant neoplasm of respiratory organs: Secondary | ICD-10-CM

## 2023-07-17 DIAGNOSIS — Z87891 Personal history of nicotine dependence: Secondary | ICD-10-CM

## 2023-07-17 DIAGNOSIS — I429 Cardiomyopathy, unspecified: Secondary | ICD-10-CM | POA: Diagnosis not present

## 2023-07-17 DIAGNOSIS — I700 Atherosclerosis of aorta: Secondary | ICD-10-CM

## 2023-07-17 DIAGNOSIS — R911 Solitary pulmonary nodule: Secondary | ICD-10-CM

## 2023-07-17 DIAGNOSIS — I2510 Atherosclerotic heart disease of native coronary artery without angina pectoris: Secondary | ICD-10-CM

## 2023-07-17 MED ORDER — METOPROLOL SUCCINATE ER 25 MG PO TB24: 25.0000 mg | ORAL_TABLET | Freq: Every day | ORAL | 3 refills | Status: DC

## 2023-07-17 MED ORDER — ROSUVASTATIN CALCIUM 20 MG PO TABS: 20.0000 mg | ORAL_TABLET | Freq: Every day | ORAL | 3 refills | Status: DC

## 2023-07-17 NOTE — Progress Notes (Signed)
 Cardiology Office Note:  .   Date:  07/17/2023  ID:  Courtney Myers, DOB June 12, 1961, MRN [id number] PCP: Courtney Butler DASEN, MD  [facility] Providers Cardiologist:  [name], MD {  History of Present Illness: .   Courtney Myers is a 63 y.o. female with a a hx of rheumatoid arthritis, chest pain who was initially seen 06/16/2020 as a new consult at the request of Courtney Butler DASEN, MD for the evaluation and management of cardiovascular risk. She presents today for follow-up.  Pertinent CV history: CT chest 2022/2023/2024 with coronary calcification and aortic atherosclerosis. Echo 12/19/2021 with EF 40-45% with global hypokinesis, RV normal, RAP 3. Monitor 2023 with 15.6% PVC burden, HR range 43-143 with average 86 bpm. 2 SVT (longest 10 beats).  Today: Has not been seen in cardiology since 12/2021. On ECG today, no PVCs, but has a new LBBB. ECG from 2023 was narrow complex.  Has been caregiving for her husband and her mother, who has moved in with them. She has not been prioritizing herself. Has not been prioritizing her own health. Overall feeling ok, but has been out of her metoprolol  for about six weeks. Did not feel PVCs at all on metoprolol , but now feeling them throughout the day.   Energy level has been fine. No breathing issues. Reviewed LBBB, signs/symptoms to watch for.  Discussed echo, if abnormal, would get CT coronary. Reviewed her CT chest lung nodule from yesterday, has minimal aortic calcification and some scattered LAD/Lcx calc, not severe.  ROS: Denies chest pain, shortness of breath at rest or with normal exertion. No PND, orthopnea, LE edema or unexpected weight gain. No syncope or palpitations. ROS otherwise negative except as noted.   Studies Reviewed: SABRA    EKG:  EKG Interpretation Date/Time:  Tuesday July 17 2023 08:18:25 EST Ventricular Rate:  72 PR Interval:  144 QRS Duration:  142 QT Interval:  450 QTC Calculation: 492 R  Axis:   64  Text Interpretation: Normal sinus rhythm Possible Left atrial enlargement Left bundle branch block Confirmed by Bruckner Shelda [phone]) on 07/17/2023 8:24:32 AM    Physical Exam:   VS:  BP 102/64   Pulse 75   Ht 5' 6 (1.676 m)   Wt 183 lb (83 kg)   SpO2 98%   BMI 29.54 kg/m    Wt Readings from Last 3 Encounters:  07/17/23 183 lb (83 kg)  06/19/23 182 lb 9.6 oz (82.8 kg)  03/28/23 189 lb 9.5 oz (86 kg)    GEN: Well nourished, well developed in no acute distress HEENT: Normal, moist mucous membranes NECK: No JVD CARDIAC: regular rhythm, normal S1 and S2, no rubs or gallops. No murmur. VASCULAR: Radial and DP pulses 2+ bilaterally. No carotid bruits RESPIRATORY:  Clear to auscultation without rales, wheezing or rhonchi  ABDOMEN: Soft, non-tender, non-distended MUSCULOSKELETAL:  Ambulates independently SKIN: Warm and dry, no edema NEUROLOGIC:  Alert and oriented x 3. No focal neuro deficits noted. PSYCHIATRIC:  Normal affect    ASSESSMENT AND PLAN: .    LBBB -new -denies chest pain, HF symptoms, or syncope -will get echo. If abnormal, would pursue coronary CT, discussed today  Cardiomyopathy, with EF 40-45% 12/2021 -rechecking with echo as above -reviewed red flag warning signs that need immediate medical attention   Family history of heart disease (father's side) Long term inflammatory disease (rheumatoid arthritis) Prior tobacco use Aortic atherosclerosis Coronary calcium : -no further chest pain -tolerating aspirin /statin -if echo abnormal, would pursue  coronary CT as above to evaluate for extent of CAD   PVCs -she is asymptomatic, but this may be potential etiology of cardiomyopathy (see above)  -feels well on metoprolol   CV risk counseling and prevention -recommend heart healthy/Mediterranean diet, with whole grains, fruits, vegetable, fish, lean meats, nuts, and olive oil. Limit salt. -recommend moderate walking, 3-5 times/week for 30-50  minutes each session. Aim for at least 150 minutes.week. Goal should be pace of 3 miles/hours, or walking 1.5 miles in 30 minutes -recommend avoidance of tobacco products. Avoid excess alcohol.  Dispo: 6 months if testing unremarkable, sooner if testing with significant abnormalities  Total time of encounter: I spent 40 minutes dedicated to the care of this patient on the date of this encounter to include pre-visit review of records, face-to-face time with the patient discussing conditions above, and clinical documentation with the electronic health record. We specifically spent time today discussing her new LBBB, what this is, how we evaluate for etiology, and symptoms to watch for.   Signed, [name], MD   [name], MD, PhD, [facility] [place]  [facility]  [facility] at [facility] [address] [place], [place] [address] [phone]

## 2023-07-17 NOTE — Patient Instructions (Signed)
 Medication Instructions:  Your physician recommends that you continue on your current medications as directed. Please refer to the Current Medication list given to you today.   Lab Work: NONE   Testing/Procedures: Your physician has requested that you have an echocardiogram. Echocardiography is a painless test that uses sound waves to create images of your heart. It provides your doctor with information about the size and shape of your heart and how well your heart's chambers and valves are working. This procedure takes approximately one hour. There are no restrictions for this procedure. Please do NOT wear cologne, perfume, aftershave, or lotions (deodorant is allowed). Please arrive 15 minutes prior to your appointment time.  Please note: We ask at that you not bring children with you during ultrasound (echo/ vascular) testing. Due to room size and safety concerns, children are not allowed in the ultrasound rooms during exams. Our front office staff cannot provide observation of children in our lobby area while testing is being conducted. An adult accompanying a patient to their appointment will only be allowed in the ultrasound room at the discretion of the ultrasound technician under special circumstances. We apologize for any inconvenience.   Follow-Up: At [facility], you and your health needs are our priority.  As part of our continuing mission to provide you with exceptional heart care, we have created designated Provider Care Teams.  These Care Teams include your primary Cardiologist (physician) and Advanced Practice Providers (APPs -  Physician Assistants and Nurse Practitioners) who all work together to provide you with the care you need, when you need it.  We recommend signing up for the patient portal called MyChart.  Sign up information is provided on this After Visit Summary.  MyChart is used to connect with patients for Virtual Visits (Telemedicine).  Patients are able  to view lab/test results, encounter notes, upcoming appointments, etc.  Non-urgent messages can be sent to your provider as well.   To learn more about what you can do with MyChart, go to [email or web address].    Your next appointment:   6 month(s)  Provider:   [name], MD or Reche Finder, NP    Other Instructions

## 2023-07-17 NOTE — Telephone Encounter (Signed)
 Tiffany calling with call report. For CT scan.Tiffany phone number is [phone].

## 2023-07-17 NOTE — Telephone Encounter (Addendum)
 Spoke with patient. Advised radiology recommended 6 month follow up for multiple new nodules, largest measuring 5.63mm. Emphysema again noted. She is on stain therapy. Patient is in agreement to 6 month follow up. No questions. 6 Month Nodule F/U CT Order placed.

## 2023-07-17 NOTE — Telephone Encounter (Signed)
 IMPRESSION: 1. A few scattered new pulmonary nodules measure 5.7 mm or less in size. Lung-RADS 3, probably benign findings. Short-term follow-up in 6 months is recommended with repeat low-dose chest CT without contrast (please use the following order, CT CHEST LCS NODULE FOLLOW-UP W/O CM). These results will be called to the ordering clinician or representative by the Radiologist Assistant, and communication documented in the PACS or [employer]. 2. Aortic atherosclerosis (ICD10-I70.0). Left and descending coronary artery calcification. 3.  Emphysema (ICD10-J43.9).

## 2023-07-20 ENCOUNTER — Other Ambulatory Visit: Payer: Self-pay | Admitting: Physician Assistant

## 2023-07-20 NOTE — Telephone Encounter (Signed)
Last Fill: 04/13/2023   Next Visit: 08/07/2023   Last Visit: 03/07/2023   Dx: Fibromyalgia    Current Dose per office note on 03/07/2023: Cymbalta 60 mg 1 capsule daily    Okay to refill Cymbalta?

## 2023-07-23 ENCOUNTER — Other Ambulatory Visit: Payer: Self-pay | Admitting: Cardiology

## 2023-07-23 DIAGNOSIS — I700 Atherosclerosis of aorta: Secondary | ICD-10-CM

## 2023-07-23 DIAGNOSIS — I2510 Atherosclerotic heart disease of native coronary artery without angina pectoris: Secondary | ICD-10-CM

## 2023-07-24 NOTE — Progress Notes (Signed)
 Office Visit Note  Patient: Courtney Myers             Date of Birth: 1961/03/06           MRN: [id number]             PCP: [name], MD Referring: [name], MD Visit Date: 08/07/2023 Occupation: @GUAROCC @  Subjective:  Medication management  History of Present Illness: Courtney Myers is a 63 y.o. female with seronegative rheumatoid arthritis, osteoarthritis and fibromyalgia.  She states she has been doing well on Rinvoq  without any flares.  She has not experienced any side effects from Rinvoq .  She is on Rinvoq  15 mg 1 tablet p.o. daily for almost 2 years now.  She denies morning stiffness.  There is no history of palpitations.  She had recent evaluation by the cardiologist and the echocardiogram results are pending.  She denies history of recurrent infections.    Activities of Daily Living:  Patient reports morning stiffness for 0 minutes.   Patient Reports nocturnal pain.  Difficulty dressing/grooming: Denies Difficulty climbing stairs: Denies Difficulty getting out of chair: Denies Difficulty using hands for taps, buttons, cutlery, and/or writing: Denies  Review of Systems  Constitutional:  Positive for fatigue.  HENT:  Positive for mouth dryness. Negative for mouth sores.   Eyes:  Negative for pain and dryness.  Respiratory:  Positive for shortness of breath.        On exertion   Cardiovascular:  Positive for palpitations. Negative for chest pain.  Gastrointestinal:  Positive for constipation and diarrhea. Negative for blood in stool.  Endocrine: Negative for increased urination.  Genitourinary:  Negative for involuntary urination.  Musculoskeletal:  Negative for joint pain, gait problem, joint pain, joint swelling, myalgias, muscle weakness, morning stiffness, muscle tenderness and myalgias.  Skin:  Negative for color change, rash, hair loss and sensitivity to sunlight.  Allergic/Immunologic: Negative for susceptible to infections.   Neurological:  Negative for dizziness and headaches.  Hematological:  Negative for swollen glands.  Psychiatric/Behavioral:  Positive for depressed mood. Negative for sleep disturbance. The patient is nervous/anxious.     PMFS History:  Patient Active Problem List   Diagnosis Date Noted   History of tobacco abuse 02/26/2022   Family history of heart disease 02/26/2022   PVC (premature ventricular contraction) 02/26/2022   Cardiomyopathy (HCC) 02/26/2022   Benign paroxysmal positional vertigo of right ear 08/25/2021   COPD (chronic obstructive pulmonary disease) (HCC) 11/16/2020   Encounter for screening for lung cancer 12/29/2019   Excessive sleepiness 09/08/2014   Fibromyalgia syndrome 08/25/2014   RA (rheumatoid arthritis) (HCC)    GERD (gastroesophageal reflux disease)    Anxiety    Depression    History of shingles    Synovitis of hand    Cervical dystonia 12/04/2012    Past Medical History:  Diagnosis Date   Anxiety    Cervical dystonia    COPD (chronic obstructive pulmonary disease) (HCC) 10/2020   Depression    Fibromyalgia    GERD (gastroesophageal reflux disease)    History of shingles    RA (rheumatoid arthritis) (HCC)    Synovitis of hand    Vertigo     Family History  Problem Relation Age of Onset   Arthritis Mother    Heart Problems Father    Heart disease Father    Heart disease Paternal Uncle        MI   Colon cancer Neg Hx  Colon polyps Neg Hx    Esophageal cancer Neg Hx    Rectal cancer Neg Hx    Stomach cancer Neg Hx    Past Surgical History:  Procedure Laterality Date   COLONOSCOPY  10/25/2009   TONSILLECTOMY     VAGINAL HYSTERECTOMY  09/01/2006   Social History   Social History Narrative   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 Right handed.    Caffeine- 4 cups daily.   Immunization History  Administered Date(s) Administered   Influenza, Seasonal,  Injecte, Preservative Fre 06/19/2023   Influenza,inj,Quad PF,6+ Mos 05/25/2018, 04/03/2019, 04/26/2020   Influenza-Unspecified 06/24/2014, 05/01/2015, 04/10/2016, 04/06/2017, 03/03/2022   Moderna Sars-Covid-2 Vaccination 08/29/2019, 09/26/2019, 06/19/2020   PNEUMOCOCCAL CONJUGATE-20 10/03/2021   Pneumococcal Conjugate Pcv21, Polysaccharide Crm197 Conjugaf 06/19/2023   Pneumococcal Conjugate-13 05/01/2015   Pneumococcal Polysaccharide-23 04/10/2016   Tdap 09/09/2015     Objective: Vital Signs: BP 110/72 (BP Location: Left Arm, Patient Position: Sitting, Cuff Size: Normal)   Pulse 71   Resp 16   Ht 5' 6 (1.676 m)   Wt 182 lb 9.6 oz (82.8 kg)   BMI 29.47 kg/m    Physical Exam Vitals and nursing note reviewed.  Constitutional:      Appearance: She is well-developed.  HENT:     Head: Normocephalic and atraumatic.  Eyes:     Conjunctiva/sclera: Conjunctivae normal.  Cardiovascular:     Rate and Rhythm: Normal rate and regular rhythm.     Heart sounds: Normal heart sounds.  Pulmonary:     Effort: Pulmonary effort is normal.     Breath sounds: Normal breath sounds.  Abdominal:     General: Bowel sounds are normal.     Palpations: Abdomen is soft.  Musculoskeletal:     Cervical back: Normal range of motion.  Lymphadenopathy:     Cervical: No cervical adenopathy.  Skin:    General: Skin is warm and dry.     Capillary Refill: Capillary refill takes less than 2 seconds.  Neurological:     Mental Status: She is alert and oriented to person, place, and time.  Psychiatric:        Behavior: Behavior normal.      Musculoskeletal Exam: Cervical spine was in good range of motion.  She had no discomfort with range of motion of thoracic and lumbar spine.  Shoulder joints, elbow joints, wrist joints, MCPs PIPs and DIPs with good range of motion with no synovitis.  Hip joints and knee joints in good range of motion without any warmth swelling or effusion.  There was no tenderness over  ankles or MTPs.  CDAI Exam: CDAI Score: -- Patient Global: 10 / 100; Provider Global: 10 / 100 Swollen: --; Tender: -- Joint Exam 08/07/2023   No joint exam has been documented for this visit   There is currently no information documented on the homunculus. Go to the Rheumatology activity and complete the homunculus joint exam.  Investigation: No additional findings.  Imaging: ECHOCARDIOGRAM COMPLETE Result Date: 08/03/2023    ECHOCARDIOGRAM REPORT   Patient Name:   Courtney Myers Date of Exam: 08/03/2023 Medical Rec #:  [id number]              Height:       66.0 in Accession #:    [id number]             Weight:       183.0 lb Date  of Birth:  1960-12-03              BSA:          1.926 m Patient Age:    62 years               BP:           102/64 mmHg Patient Gender: F                      HR:           53 bpm. Exam Location:  Outpatient Procedure: 2D Echo, 3D Echo, Cardiac Doppler, Color Doppler and Intracardiac            Opacification Agent Indications:    R06.9 DOE; I42.9 Cardiomyopathy (unspecified)  History:        Patient has prior history of Echocardiogram examinations, most                 recent 12/19/2021. Cardiomyopathy, COPD, Arrythmias:PVC,                 Signs/Symptoms:Dyspnea; Risk Factors:Former Smoker. Patient                 denies chest pain and leg edema. She does have DOE.  Sonographer:    Annabella Cater RVT, RDCS (AE), RDMS Referring Phys: [phone] BRIDGETTE CHRISTOPHER IMPRESSIONS  1. Left ventricular ejection fraction, by estimation, is 35 to 40%. The left ventricle has moderately decreased function. The left ventricle demonstrates regional wall motion abnormalities (see scoring diagram/findings for description). The left ventricular internal cavity size was moderately dilated. Left ventricular diastolic parameters are consistent with Grade I diastolic dysfunction (impaired relaxation). There is global hypokinesis and severe hypokinesis of the left ventricular, mid  anteroseptal wall and inferoseptal wall. There is severe hypokinesis of the left ventricular, apical anterior wall and septal wall. There is severe hypokinesis of the left ventricular, apical segment. There is severe hypokinesis of the left ventricular, entire inferior wall.  2. Right ventricular systolic function is normal. The right ventricular size is normal.  3. Left atrial size was moderately dilated.  4. The mitral valve is normal in structure. Mild mitral valve regurgitation. No evidence of mitral stenosis.  5. The aortic valve is tricuspid. Aortic valve regurgitation is not visualized. Aortic valve sclerosis/calcification is present, without any evidence of aortic stenosis. Aortic valve area, by VTI measures 3.54 cm. Aortic valve mean gradient measures 4.0 mmHg. Aortic valve Vmax measures 1.45 m/s.  6. The inferior vena cava is normal in size with greater than 50% respiratory variability, suggesting right atrial pressure of 3 mmHg. Comparison(s): EF 40%, global hypokinesis, mild LVH, prominent apical anomalous cord. FINDINGS  Left Ventricle: Left ventricular ejection fraction, by estimation, is 35 to 40%. The left ventricle has moderately decreased function. The left ventricle demonstrates regional wall motion abnormalities. Severe hypokinesis of the left ventricular, mid anteroseptal wall and inferoseptal wall. Severe hypokinesis of the left ventricular, apical anterior wall and septal wall. Severe hypokinesis of the left ventricular, entire inferior wall. Definity  contrast agent was given IV to delineate the left ventricular endocardial borders. 3D ejection fraction reviewed and evaluated as part of the interpretation. Alternate measurement of EF is felt to be most reflective of LV function. The left ventricular internal cavity size was moderately dilated. There is no left ventricular hypertrophy. Abnormal (paradoxical) septal motion, consistent with left bundle branch block. Left ventricular diastolic  parameters are consistent with Grade I diastolic  dysfunction (impaired relaxation). Normal left ventricular filling pressure. Right Ventricle: The right ventricular size is normal. No increase in right ventricular wall thickness. Right ventricular systolic function is normal. Left Atrium: Left atrial size was moderately dilated. Right Atrium: Right atrial size was normal in size. Pericardium: There is no evidence of pericardial effusion. Mitral Valve: The mitral valve is normal in structure. Mild mitral valve regurgitation. No evidence of mitral valve stenosis. Tricuspid Valve: The tricuspid valve is normal in structure. Tricuspid valve regurgitation is trivial. No evidence of tricuspid stenosis. Aortic Valve: The aortic valve is tricuspid. Aortic valve regurgitation is not visualized. Aortic valve sclerosis/calcification is present, without any evidence of aortic stenosis. Aortic valve mean gradient measures 4.0 mmHg. Aortic valve peak gradient measures 8.4 mmHg. Aortic valve area, by VTI measures 3.54 cm. Pulmonic Valve: The pulmonic valve was normal in structure. Pulmonic valve regurgitation is mild. No evidence of pulmonic stenosis. Aorta: The aortic root is normal in size and structure. Venous: The inferior vena cava is normal in size with greater than 50% respiratory variability, suggesting right atrial pressure of 3 mmHg. IAS/Shunts: No atrial level shunt detected by color flow Doppler.  LEFT VENTRICLE PLAX 2D LVIDd:         5.96 cm   Diastology LVIDs:         4.74 cm   LV e' medial:    6.15 cm/s LV PW:         0.79 cm   LV E/e' medial:  12.0 LV IVS:        0.93 cm   LV e' lateral:   6.83 cm/s LVOT diam:     2.50 cm   LV E/e' lateral: 10.8 LV SV:         96 LV SV Index:   50 LVOT Area:     4.91 cm                           3D Volume EF:                          3D EF:        45 %                          LV EDV:       196 ml                          LV ESV:       107 ml                          LV SV:         89 ml RIGHT VENTRICLE RV S prime:     15.80 cm/s TAPSE (M-mode): 2.9 cm LEFT ATRIUM             Index        RIGHT ATRIUM           Index LA diam:        3.90 cm 2.03 cm/m   RA Area:     16.20 cm LA Vol (A2C):   69.3 ml 35.99 ml/m  RA Volume:   44.90 ml  23.32 ml/m LA Vol (A4C):   91.1 ml 47.31 ml/m LA Biplane Vol: 83.0 ml 43.10 ml/m  AORTIC VALVE  PULMONIC VALVE AV Area (Vmax):    2.88 cm     PV Vmax:          1.06 m/s AV Area (Vmean):   3.08 cm     PV Peak grad:     4.5 mmHg AV Area (VTI):     3.54 cm     PR End Diast Vel: 3.90 msec AV Vmax:           145.00 cm/s AV Vmean:          87.200 cm/s AV VTI:            0.272 m AV Peak Grad:      8.4 mmHg AV Mean Grad:      4.0 mmHg LVOT Vmax:         85.10 cm/s LVOT Vmean:        54.700 cm/s LVOT VTI:          0.196 m LVOT/AV VTI ratio: 0.72  AORTA Ao Root diam: 3.10 cm Ao Asc diam:  3.40 cm Ao Arch diam: 3.2 cm MITRAL VALVE               TRICUSPID VALVE MV Area (PHT): 3.06 cm    TR Peak grad:   28.1 mmHg MV Decel Time: 248 msec    TR Vmax:        265.00 cm/s MR Peak grad: 70.9 mmHg MR Vmax:      421.00 cm/s  SHUNTS MV E velocity: 73.90 cm/s  Systemic VTI:  0.20 m MV A velocity: 94.80 cm/s  Systemic Diam: 2.50 cm MV E/A ratio:  0.78 Wilbert Bihari MD Electronically signed by Wilbert Bihari MD Signature Date/Time: 08/03/2023/4:26:44 PM    Final     Recent Labs: Lab Results  Component Value Date   WBC 9.7 06/04/2023   HGB 13.9 06/04/2023   PLT 231 06/04/2023   NA 137 07/30/2023   K 4.4 07/30/2023   CL 101 07/30/2023   CO2 28 07/30/2023   GLUCOSE 82 07/30/2023   BUN 15 07/30/2023   CREATININE 1.13 (H) 07/30/2023   BILITOT 0.4 06/04/2023   ALKPHOS 91 11/18/2020   AST 25 06/04/2023   ALT 20 06/04/2023   PROT 6.7 06/04/2023   ALBUMIN 4.2 11/18/2020   CALCIUM  9.6 07/30/2023   GFRAA 61 11/02/2020   QFTBGOLD Negative 09/14/2015   QFTBGOLDPLUS NEGATIVE 06/04/2023      Speciality Comments: Remicade  6mg /kg every 6 weeks  07/13-09/21 Rinvoq  15 mg qd 04/28/20 MTX - dcd due to increase in Cr Arava  started 06/21/20-12/22- increase in cr   Procedures:  No procedures performed Allergies: Patient has no known allergies.   Assessment / Plan:     Visit Diagnoses: Rheumatoid arthritis of multiple sites with negative rheumatoid factor (HCC)-patient denies having a rheumatoid arthritis flare since her last visit.  No synovitis was noted on the examination today.  She has been doing well on Rinvoq  15 mg p.o. daily.  Patient denies any interruption in the treatment.  High risk medication use - Rinvoq  15 mg 1 tablet by mouth daily (started 04/28/20).  Previous therapy: Remicade , Arava , methotrexate .June 04, 2023 CBC normal, CMP normal except creatinine 1.13.  TB Gold negative July 30, 2023 creatinine 1.13, lipid panel normal with LDL 71.  Labs were reviewed with the patient.  The creatinine remains mildly elevated.  She will discuss elevated creatinine with Dr. Duanne.  She was advised to have repeat labs in March and every 3 months to monitor  for drug toxicity.  Information on immunization was placed in the AVS.  She was advised to hold Rinvoq  if she develops an infection resume after the infection resolves.  Annual sunscreen to screen for skin cancer was advised while she is on Rinvoq .  Use of sunscreen and sun protection was discussed.  FDA blackbox warning with Rinvoq  including increased risk of major adverse cardiovascular events, thrombosis, pulmonary embolism, serious infection, lymphoma, MI and a stroke were discussed.  Screening for tuberculosis-TB Gold was negative on June 04, 2023.  Sicca complex (HCC)-she uses calomel patch and over-the-counter products which are helpful.  Chronic pain of both knees-she denies any discomfort in her knee joints.  Fibromyalgia -she states that generalized pain and discomfort from fibromyalgia is manageable on Cymbalta  60 mg 1 capsule daily.  Need for regular exercise and  stretching was discussed.  Chronic fatigue-she continues to have some fatigue most likely related to fibromyalgia.  History of gastroesophageal reflux (GERD)  Anxiety and depression -manageable with Cymbalta  60 mg 1 capsule daily.  History of COPD-she is to have some shortness of breath.  She had recent cardiology evaluation.  She states echocardiogram results are pending.  Former smoker-she is on Chantix .  Primary insomnia-hygiene was discussed.  Orders: No orders of the defined types were placed in this encounter.  No orders of the defined types were placed in this encounter.    Follow-Up Instructions: Return in about 5 months (around 01/04/2024) for Rheumatoid arthritis.   Maya Nash, MD  Note - This record has been created using Animal nutritionist.  Chart creation errors have been sought, but may not always  have been located. Such creation errors do not reflect on  the standard of medical care.

## 2023-07-30 ENCOUNTER — Other Ambulatory Visit: Payer: Self-pay

## 2023-07-30 DIAGNOSIS — R7989 Other specified abnormal findings of blood chemistry: Secondary | ICD-10-CM

## 2023-07-30 DIAGNOSIS — Z79899 Other long term (current) drug therapy: Secondary | ICD-10-CM

## 2023-07-31 LAB — LIPID PANEL
Cholesterol: 163 mg/dL (ref <200)
HDL: 69 mg/dL (ref >=50)
LDL Cholesterol (Calc): 71 mg/dL
Non-HDL Cholesterol (Calc): 94 mg/dL (ref <130)
Total CHOL/HDL Ratio: 2.4 (calc) (ref <5.0)
Triglycerides: 146 mg/dL (ref <150)

## 2023-07-31 LAB — RENAL FUNCTION PANEL
Albumin: 4.4 g/dL (ref 3.6–5.1)
BUN/Creatinine Ratio: 13 (calc) (ref 6–22)
BUN: 15 mg/dL (ref 7–25)
CO2: 28 mmol/L (ref 20–32)
Calcium: 9.6 mg/dL (ref 8.6–10.4)
Chloride: 101 mmol/L (ref 98–110)
Creat: 1.13 mg/dL — ABNORMAL HIGH (ref 0.50–1.05)
Glucose, Bld: 82 mg/dL (ref 65–99)
Phosphorus: 4.6 mg/dL — ABNORMAL HIGH (ref 2.5–4.5)
Potassium: 4.4 mmol/L (ref 3.5–5.3)
Sodium: 137 mmol/L (ref 135–146)

## 2023-07-31 NOTE — Progress Notes (Signed)
Creatinine remains slightly elevated-1.13-stable.   Phosphorus is borderline elevated Rest of renal function panel WNL  Lipid panel WNL

## 2023-08-03 ENCOUNTER — Ambulatory Visit (INDEPENDENT_AMBULATORY_CARE_PROVIDER_SITE_OTHER): Payer: 59

## 2023-08-03 DIAGNOSIS — I493 Ventricular premature depolarization: Secondary | ICD-10-CM | POA: Diagnosis not present

## 2023-08-03 DIAGNOSIS — I447 Left bundle-branch block, unspecified: Secondary | ICD-10-CM | POA: Diagnosis not present

## 2023-08-03 DIAGNOSIS — I429 Cardiomyopathy, unspecified: Secondary | ICD-10-CM

## 2023-08-03 LAB — ECHOCARDIOGRAM COMPLETE
AR max vel: 2.88 cm2
AV Area VTI: 3.54 cm2
AV Area mean vel: 3.08 cm2
AV Mean grad: 4 mm[Hg]
AV Peak grad: 8.4 mm[Hg]
Ao pk vel: 1.45 m/s
Area-P 1/2: 3.06 cm2
MV M vel: 4.21 m/s
MV Peak grad: 70.9 mm[Hg]
S' Lateral: 4.74 cm

## 2023-08-03 MED ORDER — PERFLUTREN LIPID MICROSPHERE
1.0000 mL | INTRAVENOUS | Status: AC | PRN
  Administered 2023-08-03: 4 mL via INTRAVENOUS

## 2023-08-07 ENCOUNTER — Encounter: Payer: Self-pay | Admitting: Rheumatology

## 2023-08-07 ENCOUNTER — Ambulatory Visit: Payer: 59 | Attending: Rheumatology | Admitting: Rheumatology

## 2023-08-07 VITALS — BP 110/72 | HR 71 | Resp 16 | Ht 66.0 in | Wt 182.6 lb

## 2023-08-07 DIAGNOSIS — M3500 Sicca syndrome, unspecified: Secondary | ICD-10-CM

## 2023-08-07 DIAGNOSIS — Z87891 Personal history of nicotine dependence: Secondary | ICD-10-CM

## 2023-08-07 DIAGNOSIS — Z111 Encounter for screening for respiratory tuberculosis: Secondary | ICD-10-CM | POA: Diagnosis not present

## 2023-08-07 DIAGNOSIS — M797 Fibromyalgia: Secondary | ICD-10-CM

## 2023-08-07 DIAGNOSIS — Z79899 Other long term (current) drug therapy: Secondary | ICD-10-CM | POA: Diagnosis not present

## 2023-08-07 DIAGNOSIS — M0609 Rheumatoid arthritis without rheumatoid factor, multiple sites: Secondary | ICD-10-CM

## 2023-08-07 DIAGNOSIS — F5101 Primary insomnia: Secondary | ICD-10-CM

## 2023-08-07 DIAGNOSIS — Z8709 Personal history of other diseases of the respiratory system: Secondary | ICD-10-CM

## 2023-08-07 DIAGNOSIS — Z8719 Personal history of other diseases of the digestive system: Secondary | ICD-10-CM

## 2023-08-07 DIAGNOSIS — F419 Anxiety disorder, unspecified: Secondary | ICD-10-CM

## 2023-08-07 DIAGNOSIS — R5382 Chronic fatigue, unspecified: Secondary | ICD-10-CM

## 2023-08-07 DIAGNOSIS — F32A Depression, unspecified: Secondary | ICD-10-CM

## 2023-08-07 DIAGNOSIS — G8929 Other chronic pain: Secondary | ICD-10-CM

## 2023-08-07 DIAGNOSIS — M25562 Pain in left knee: Secondary | ICD-10-CM

## 2023-08-07 DIAGNOSIS — M25561 Pain in right knee: Secondary | ICD-10-CM

## 2023-08-07 NOTE — Patient Instructions (Addendum)
Standing Labs We placed an order today for your standing lab work.   Please have your standing labs drawn in March and every 3 months  Please have your labs drawn 2 weeks prior to your appointment so that the provider can discuss your lab results at your appointment, if possible.  Please note that you may see your imaging and lab results in MyChart before we have reviewed them. We will contact you once all results are reviewed. Please allow our office up to 72 hours to thoroughly review all of the results before contacting the office for clarification of your results.  WALK-IN LAB HOURS  Monday through Thursday from 8:00 am -12:30 pm and 1:00 pm-5:00 pm and Friday from 8:00 am-12:00 pm.  Patients with office visits requiring labs will be seen before walk-in labs.  You may encounter longer than normal wait times. Please allow additional time. Wait times may be shorter on  Monday and Thursday afternoons.  We do not book appointments for walk-in labs. We appreciate your patience and understanding with our staff.   Labs are drawn by [facility]. Please bring your co-pay at the time of your lab draw.  You may receive a bill from [facility] for your lab work.  Please note if you are on Hydroxychloroquine and and an order has been placed for a Hydroxychloroquine level,  you will need to have it drawn 4 hours or more after your last dose.  If you wish to have your labs drawn at another location, please call the office 24 hours in advance so we can fax the orders.  The office is located at [address], [address], [place], [place] [address]   If you have any questions regarding directions or hours of operation,  please call [phone].   As a reminder, please drink plenty of water prior to coming for your lab work. Thanks!   Vaccines You are taking a medication(s) that can suppress your immune system.  The following immunizations are recommended: Flu annually Covid-19  RSV Td/Tdap (tetanus,  diphtheria, pertussis) every 10 years Pneumonia (Prevnar 15 then Pneumovax 23 at least 1 year apart.  Alternatively, can take Prevnar 20 without needing additional dose) Shingrix: 2 doses from 4 weeks to 6 months apart  Please check with your PCP to make sure you are up to date.  If you have signs or symptoms of an infection or start antibiotics: First, call your PCP for workup of your infection. Hold your medication through the infection, until you complete your antibiotics, and until symptoms resolve if you take the following: Injectable medication (Actemra, Benlysta, Cimzia, Cosentyx, Enbrel, Humira, Kevzara, Orencia, Remicade, Simponi, Stelara, Taltz, Tremfya) Methotrexate Leflunomide (Arava) Mycophenolate (Cellcept) Osborne [place], or Rinvoq   Because you are taking Harriette Ohara, Rinvoq, or Olumiant, it is very important to know that this class of medications has a FDA BLACK BOX WARNING for major adverse cardiovascular events (MACE), thrombosis, mortality (including sudden cardiovascular death), serious infections, and lymphomas. MACE is defined as cardiovascular death, myocardial infarction, and stroke. Thrombosis includes deep venous thrombosis (DVT), pulmonary embolism (PE), and arterial thrombosis. If you are a current or former smoker, you are at higher risk for MACE.   Please get annual skin examination by dermatologist to screen for skin cancer while you are on Rinvoq.  Please use sunscreen and sun protection.

## 2023-08-13 ENCOUNTER — Other Ambulatory Visit: Payer: Self-pay | Admitting: Family Medicine

## 2023-08-13 DIAGNOSIS — K219 Gastro-esophageal reflux disease without esophagitis: Secondary | ICD-10-CM

## 2023-08-19 ENCOUNTER — Telehealth: Payer: 59 | Admitting: Nurse Practitioner

## 2023-08-19 DIAGNOSIS — J101 Influenza due to other identified influenza virus with other respiratory manifestations: Secondary | ICD-10-CM | POA: Diagnosis not present

## 2023-08-19 MED ORDER — PREDNISONE 20 MG PO TABS: 20.0000 mg | ORAL_TABLET | Freq: Every day | ORAL | 0 refills | Status: AC

## 2023-08-19 MED ORDER — OSELTAMIVIR PHOSPHATE 75 MG PO CAPS: 75.0000 mg | ORAL_CAPSULE | Freq: Once | ORAL | 0 refills | Status: AC

## 2023-08-19 MED ORDER — OSELTAMIVIR PHOSPHATE 30 MG PO CAPS
30.0000 mg | ORAL_CAPSULE | Freq: Two times a day (BID) | ORAL | 0 refills | Status: AC
Start: 2023-08-19 — End: 2023-08-24

## 2023-08-19 NOTE — Patient Instructions (Signed)
Courtney Myers, thank you for joining Claiborne Rigg, NP for today's virtual visit.  While this provider is not your primary care provider (PCP), if your PCP is located in our provider database this encounter information will be shared with them immediately following your visit.   A [facility] MyChart account gives you access to today's visit and all your visits, tests, and labs performed at [facility] " click here if you don't have a [facility] MyChart account or go to [email or web address]  Consent: (Patient) Courtney Myers provided verbal consent for this virtual visit at the beginning of the encounter.  Current Medications:  Current Outpatient Medications:    oseltamivir (TAMIFLU) 30 MG capsule, Take 1 capsule (30 mg total) by mouth 2 (two) times daily for 5 days., Disp: 10 capsule, Rfl: 0   oseltamivir (TAMIFLU) 75 MG capsule, Take 1 capsule (75 mg total) by mouth once for 1 dose. Then continue tamiflu 30 mg capsules twice daily, Disp: 1 capsule, Rfl: 0   predniSONE (DELTASONE) 20 MG tablet, Take 1 tablet (20 mg total) by mouth daily with breakfast for 5 days., Disp: 5 tablet, Rfl: 0   AbobotulinumtoxinA (DYSPORT) 500 units SOLR injection, INJECT 500 UNITS  INTRAMUSCULARLY EVERY 3  MONTHS (GIVEN AT MD OFFICE, DISCARD UNUSED), Disp: 1 each, Rfl: 3   aspirin EC 81 MG tablet, Take 1 tablet (81 mg total) by mouth daily. Swallow whole., Disp: 90 tablet, Rfl: 3   clonazePAM (KLONOPIN) 0.5 MG tablet, TAKE 1 TABLET BY MOUTH TWICE DAILY AS NEEDED, Disp: 60 tablet, Rfl: 2   DULoxetine (CYMBALTA) 60 MG capsule, TAKE 1 CAPSULE(60 MG) BY MOUTH DAILY, Disp: 30 capsule, Rfl: 2   Fluticasone-Umeclidin-Vilant (TRELEGY ELLIPTA) 200-62.5-25 MCG/ACT AEPB, USE 1 INHALATION BY MOUTH DAILY, Disp: 180 each, Rfl: 0   metoprolol succinate (TOPROL-XL) 25 MG 24 hr tablet, Take 1 tablet (25 mg total) by mouth daily., Disp: 90 tablet, Rfl: 3   Multiple Vitamins-Minerals (MULTIVITAMINS  THER. W/MINERALS) TABS, Take 1 tablet by mouth at bedtime. , Disp: , Rfl:    omeprazole (PRILOSEC) 20 MG capsule, TAKE 1 CAPSULE BY MOUTH DAILY, Disp: 90 capsule, Rfl: 3   RINVOQ 15 MG TB24, TAKE 1 TABLET BY MOUTH DAILY, Disp: 90 tablet, Rfl: 0   rosuvastatin (CRESTOR) 20 MG tablet, Take 1 tablet (20 mg total) by mouth daily., Disp: 90 tablet, Rfl: 3   varenicline (CHANTIX CONTINUING MONTH PAK) 1 MG tablet, Take 1 tablet (1 mg total) by mouth 2 (two) times daily., Disp: 60 tablet, Rfl: 5   Xylitol (XYLIMELTS MT), Use as directed 2 tablets in the mouth or throat at bedtime as needed (dry mouth). , Disp: , Rfl:   Current Facility-Administered Medications:    0.9 %  sodium chloride infusion, 500 mL, Intravenous, Once, Nandigam, Kavitha V, MD   Medications ordered in this encounter:  Meds ordered this encounter  Medications   predniSONE (DELTASONE) 20 MG tablet    Sig: Take 1 tablet (20 mg total) by mouth daily with breakfast for 5 days.    Dispense:  5 tablet    Refill:  0    Supervising Provider:   Merrilee Jansky [id number]   oseltamivir (TAMIFLU) 30 MG capsule    Sig: Take 1 capsule (30 mg total) by mouth 2 (two) times daily for 5 days.    Dispense:  10 capsule    Refill:  0    Supervising Provider:   Merrilee Jansky [id number]   oseltamivir (  TAMIFLU) 75 MG capsule    Sig: Take 1 capsule (75 mg total) by mouth once for 1 dose. Then continue tamiflu 30 mg capsules twice daily    Dispense:  1 capsule    Refill:  0    Supervising Provider:   Merrilee Jansky [[id number]]     *If you need refills on other medications prior to your next appointment, please contact your pharmacy*  Follow-Up: Call back or seek an in-person evaluation if the symptoms worsen or if the condition fails to improve as anticipated.  [facility] [phone]  Other Instructions INSTRUCTIONS: use a humidifier for nasal congestion Drink plenty of fluids, rest and wash hands frequently to avoid  the spread of infection Alternate tylenol and Motrin for relief of fever    If you have been instructed to have an in-person evaluation today at a local Urgent Care facility, please use the link below. It will take you to a list of all of our available [facility], including address, phone number and hours of operation. Please do not delay care.  [facility]  If you or a family member do not have a primary care provider, use the link below to schedule a visit and establish care. When you choose a [facility] primary care physician or advanced practice provider, you gain a long-term partner in health. Find a Primary Care Provider  Learn more about [facility] in-office and virtual care options: [facility] - Get Care Now

## 2023-08-19 NOTE — Progress Notes (Signed)
Virtual Visit Consent   Courtney Myers, you are scheduled for a virtual visit with a [facility] provider today. Just as with appointments in the office, your consent must be obtained to participate. Your consent will be active for this visit and any virtual visit you may have with one of our providers in the next 365 days. If you have a MyChart account, a copy of this consent can be sent to you electronically.  As this is a virtual visit, video technology does not allow for your provider to perform a traditional examination. This may limit your provider's ability to fully assess your condition. If your provider identifies any concerns that need to be evaluated in person or the need to arrange testing (such as labs, EKG, etc.), we will make arrangements to do so. Although advances in technology are sophisticated, we cannot ensure that it will always work on either your end or our end. If the connection with a video visit is poor, the visit may have to be switched to a telephone visit. With either a video or telephone visit, we are not always able to ensure that we have a secure connection.  By engaging in this virtual visit, you consent to the provision of healthcare and authorize for your insurance to be billed (if applicable) for the services provided during this visit. Depending on your insurance coverage, you may receive a charge related to this service.  I need to obtain your verbal consent now. Are you willing to proceed with your visit today? [name] has provided verbal consent on 08/19/2023 for a virtual visit (video or telephone). Claiborne Rigg, NP  Date: 08/19/2023 12:52 PM   Virtual Visit via Video Note   I, Claiborne Rigg, connected with  Courtney Myers  ([id number], 1961/01/12) on 08/19/23 at 12:45 PM EST by a video-enabled telemedicine application and verified that I am speaking with the correct person using two identifiers.  Location: Patient: Virtual  Visit Location Patient: Home Provider: Virtual Visit Location Provider: Home Office   I discussed the limitations of evaluation and management by telemedicine and the availability of in person appointments. The patient expressed understanding and agreed to proceed.    History of Present Illness: Courtney Myers is a 63 y.o. who identifies as a female who was assigned female at birth, and is being seen today for FLU exposure and symptoms  [name]'s Husband tested positive 2 days ago for flu A.  Today Mrs. Courtney Myers Woke up with severe flu symptoms of fever 100.2, sore throat, cough, chills, body aches lightheadedness. Taking Tylenol with little relief.     Problems:  Patient Active Problem List   Diagnosis Date Noted   History of tobacco abuse 02/26/2022   Family history of heart disease 02/26/2022   PVC (premature ventricular contraction) 02/26/2022   Cardiomyopathy (HCC) 02/26/2022   Benign paroxysmal positional vertigo of right ear 08/25/2021   COPD (chronic obstructive pulmonary disease) (HCC) 11/16/2020   Encounter for screening for lung cancer 12/29/2019   Excessive sleepiness 09/08/2014   Fibromyalgia syndrome 08/25/2014   RA (rheumatoid arthritis) (HCC)    GERD (gastroesophageal reflux disease)    Anxiety    Depression    History of shingles    Synovitis of hand    Cervical dystonia 12/04/2012    Allergies: No Known Allergies Medications:  Current Outpatient Medications:    oseltamivir (TAMIFLU) 30 MG capsule, Take 1 capsule (30 mg total) by mouth 2 (two) times daily  for 5 days., Disp: 10 capsule, Rfl: 0   oseltamivir (TAMIFLU) 75 MG capsule, Take 1 capsule (75 mg total) by mouth once for 1 dose. Then continue tamiflu 30 mg capsules twice daily, Disp: 1 capsule, Rfl: 0   predniSONE (DELTASONE) 20 MG tablet, Take 1 tablet (20 mg total) by mouth daily with breakfast for 5 days., Disp: 5 tablet, Rfl: 0   AbobotulinumtoxinA (DYSPORT) 500 units SOLR injection, INJECT  500 UNITS  INTRAMUSCULARLY EVERY 3  MONTHS (GIVEN AT MD OFFICE, DISCARD UNUSED), Disp: 1 each, Rfl: 3   aspirin EC 81 MG tablet, Take 1 tablet (81 mg total) by mouth daily. Swallow whole., Disp: 90 tablet, Rfl: 3   clonazePAM (KLONOPIN) 0.5 MG tablet, TAKE 1 TABLET BY MOUTH TWICE DAILY AS NEEDED, Disp: 60 tablet, Rfl: 2   DULoxetine (CYMBALTA) 60 MG capsule, TAKE 1 CAPSULE(60 MG) BY MOUTH DAILY, Disp: 30 capsule, Rfl: 2   Fluticasone-Umeclidin-Vilant (TRELEGY ELLIPTA) 200-62.5-25 MCG/ACT AEPB, USE 1 INHALATION BY MOUTH DAILY, Disp: 180 each, Rfl: 0   metoprolol succinate (TOPROL-XL) 25 MG 24 hr tablet, Take 1 tablet (25 mg total) by mouth daily., Disp: 90 tablet, Rfl: 3   Multiple Vitamins-Minerals (MULTIVITAMINS THER. W/MINERALS) TABS, Take 1 tablet by mouth at bedtime. , Disp: , Rfl:    omeprazole (PRILOSEC) 20 MG capsule, TAKE 1 CAPSULE BY MOUTH DAILY, Disp: 90 capsule, Rfl: 3   RINVOQ 15 MG TB24, TAKE 1 TABLET BY MOUTH DAILY, Disp: 90 tablet, Rfl: 0   rosuvastatin (CRESTOR) 20 MG tablet, Take 1 tablet (20 mg total) by mouth daily., Disp: 90 tablet, Rfl: 3   varenicline (CHANTIX CONTINUING MONTH PAK) 1 MG tablet, Take 1 tablet (1 mg total) by mouth 2 (two) times daily., Disp: 60 tablet, Rfl: 5   Xylitol (XYLIMELTS MT), Use as directed 2 tablets in the mouth or throat at bedtime as needed (dry mouth). , Disp: , Rfl:   Current Facility-Administered Medications:    0.9 %  sodium chloride infusion, 500 mL, Intravenous, Once, Nandigam, Kavitha V, MD  Observations/Objective: Patient is well-developed, well-nourished in no acute distress.  Resting comfortably at home.  Head is normocephalic, atraumatic.  No labored breathing.  Speech is clear and coherent with logical content.  Patient is alert and oriented at baseline.    Assessment and Plan: 1. URI due to influenza A virus (Primary) - predniSONE (DELTASONE) 20 MG tablet; Take 1 tablet (20 mg total) by mouth daily with breakfast for 5 days.   Dispense: 5 tablet; Refill: 0 - oseltamivir (TAMIFLU) 30 MG capsule; Take 1 capsule (30 mg total) by mouth 2 (two) times daily for 5 days.  Dispense: 10 capsule; Refill: 0 - oseltamivir (TAMIFLU) 75 MG capsule; Take 1 capsule (75 mg total) by mouth once for 1 dose. Then continue tamiflu 30 mg capsules twice daily  Dispense: 1 capsule; Refill: 0 INSTRUCTIONS: use a humidifier for nasal congestion Drink plenty of fluids, rest and wash hands frequently to avoid the spread of infection Alternate tylenol and Motrin for relief of fever   Follow Up Instructions: I discussed the assessment and treatment plan with the patient. The patient was provided an opportunity to ask questions and all were answered. The patient agreed with the plan and demonstrated an understanding of the instructions.  A copy of instructions were sent to the patient via MyChart unless otherwise noted below.    The patient was advised to call back or seek an in-person evaluation if the symptoms worsen or if  the condition fails to improve as anticipated.    Claiborne Rigg, NP

## 2023-09-05 ENCOUNTER — Encounter (HOSPITAL_BASED_OUTPATIENT_CLINIC_OR_DEPARTMENT_OTHER): Payer: Self-pay

## 2023-09-05 DIAGNOSIS — R0609 Other forms of dyspnea: Secondary | ICD-10-CM

## 2023-09-11 ENCOUNTER — Other Ambulatory Visit: Payer: Self-pay | Admitting: Pulmonary Disease

## 2023-09-12 ENCOUNTER — Ambulatory Visit: Payer: 59 | Admitting: Neurology

## 2023-09-12 ENCOUNTER — Telehealth: Payer: Self-pay | Admitting: Family Medicine

## 2023-09-12 ENCOUNTER — Other Ambulatory Visit (HOSPITAL_BASED_OUTPATIENT_CLINIC_OR_DEPARTMENT_OTHER): Payer: Self-pay

## 2023-09-12 DIAGNOSIS — I700 Atherosclerosis of aorta: Secondary | ICD-10-CM

## 2023-09-12 DIAGNOSIS — I2510 Atherosclerotic heart disease of native coronary artery without angina pectoris: Secondary | ICD-10-CM

## 2023-09-12 DIAGNOSIS — I493 Ventricular premature depolarization: Secondary | ICD-10-CM

## 2023-09-12 DIAGNOSIS — I429 Cardiomyopathy, unspecified: Secondary | ICD-10-CM

## 2023-09-12 MED ORDER — ROSUVASTATIN CALCIUM 20 MG PO TABS: 20.0000 mg | ORAL_TABLET | Freq: Every day | ORAL | 3 refills | Status: DC

## 2023-09-12 MED ORDER — METOPROLOL SUCCINATE ER 25 MG PO TB24
25.0000 mg | ORAL_TABLET | Freq: Every day | ORAL | 3 refills | Status: DC
Start: 2023-09-12 — End: 2024-01-18

## 2023-09-12 NOTE — Telephone Encounter (Signed)
 Prescription Request  09/12/2023  LOV: 06/19/2023  What is the name of the medication or equipment?   varenicline (CHANTIX CONTINUING MONTH PAK) 1 MG tablet   clonazePAM (KLONOPIN) 0.5 MG tablet [[id number]]  **New scripts requested**  Have you contacted your pharmacy to request a refill? Yes   Which pharmacy would you like this sent to?  [facility] - [place], [place] - [address] [address] Phone: [phone] Fax: [phone]    Patient notified that their request is being sent to the clinical staff for review and that they should receive a response within 2 business days.   Please advise pharmacist.

## 2023-09-13 ENCOUNTER — Other Ambulatory Visit: Payer: Self-pay | Admitting: Pulmonary Disease

## 2023-09-13 ENCOUNTER — Other Ambulatory Visit: Payer: Self-pay | Admitting: Family Medicine

## 2023-09-13 MED ORDER — CLONAZEPAM 0.5 MG PO TABS: 0.5000 mg | ORAL_TABLET | Freq: Two times a day (BID) | ORAL | 2 refills | Status: DC | PRN

## 2023-09-13 MED ORDER — VARENICLINE TARTRATE 1 MG PO TABS: 1.0000 mg | ORAL_TABLET | Freq: Two times a day (BID) | ORAL | 5 refills | Status: DC

## 2023-09-14 ENCOUNTER — Other Ambulatory Visit: Payer: Self-pay | Admitting: Physician Assistant

## 2023-09-14 NOTE — Telephone Encounter (Signed)
 Last Fill: 06/18/2023  Labs: 06/04/2023 Creatinine is mildly elevated.  CBC is normal.  Patient should avoid all NSAIDs and increase water intake. I called patient, labs are due, patient will come in next week.  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

## 2023-09-17 ENCOUNTER — Other Ambulatory Visit: Payer: Self-pay

## 2023-09-17 ENCOUNTER — Ambulatory Visit: Payer: 59 | Admitting: Neurology

## 2023-09-17 VITALS — BP 114/62 | HR 81 | Ht 66.0 in | Wt 186.0 lb

## 2023-09-17 DIAGNOSIS — G243 Spasmodic torticollis: Secondary | ICD-10-CM | POA: Diagnosis not present

## 2023-09-17 DIAGNOSIS — Z79899 Other long term (current) drug therapy: Secondary | ICD-10-CM

## 2023-09-17 MED ORDER — ABOBOTULINUMTOXINA 500 UNITS IM SOLR
500.0000 [IU] | Freq: Once | INTRAMUSCULAR | Status: AC
  Administered 2023-09-17: 500 [IU] via INTRAMUSCULAR

## 2023-09-17 NOTE — Progress Notes (Signed)
 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  Symptom onset was without apparent triggering event, she has gradually developed this neck pulling,  manifested primarily with right laterocollis, mild retrocolis, and left shoulder elevation since 2000. She has good relief with regular Botox injection, about 4 times a year since 2003. Last injection was February 18, 2010 by Dr. Deneen Harts,  She reported great relief as usual, taking 4- 5 days to get symptom relieve, lasting about 3 months.  I began to inject her since 12.2011, every 3-4 months, dosage has been limited to 150 units of BOTOX A.  She denies neck pain, gait difficulty, but with wearing off Botox, she noticed increased head tremor, and pulling towards her right shoulder.  She developed  transient difficulty lifting her neck up when using BOTOX A 200 units previously, reponding well to decreased dose of 150 units  She was enrolled into IPSEN dysport study, first injection was in April 30th 2013, she later enrolled into open label, 2nd injection was in  May 28th 2013. She did very well.  Recent few weeks,  She had experienced flareup of her rheumatoid arthritis, has received IV infusion. Last injection was in 06/10/2012,  500 units of dysport was dissolved into 2 cc of normal saline. Right longissimus capitus 0.5 cc Right splenius capitis 0.5 cc Right levator scapular 0.5 cc Right iliocostalis 0.5 cc   Dysport research study has completed, she is now coming back for repeat injection, she noticed head bobbing over the past few weeks, she denies significant neck pain, weakness,  Her rheumatoid arthritis is under better control   Last injection was in Sep 2014, she did very well, no neck extension weakness, only rarely neck shaking  UPDATE March 8th 2016: She has lost follow-up since her last  visit December 2014, She came in for treatment her cervical dystonia, she feel the tension of her neck all the time, stiffness. Denied gait difficulty, last injection was September 2014, she received 500 units of Dysport, works well for her, she is continue on medication for anxiety, rheumatoid arthritis She also complains of nighttime snoring, frequent awakening, excessive daytime sleepiness, fatigue, today's ESS is 6, FSS was 38,  UPDATE October 13 2014:   She has missed her scheduled sleep study, continue have worsening neck posturing, posterior neck pain, excessive daytime fatigue and sleepiness  UPDATE Nov 11 2014: She came in for EMG guided Dysport injection today, last injection was December 2014, she complains of worsening head titubation, bilateral hands mild postural tremor, posterior neck pain,  Update February 17 2015 She responded very well to last EMG guided Dysport injection Nov 11 2014, no significant head titubation, no significant neck pain  Update July 22 2015: Last injection was August, now she noticed returning of neck pulling and shaking, posterior neck muscle achy pain  Update October 21 2015: Last EMG guided Dysport injection was in January, she responded very well, only recent couple weeks, she noticed returning of neck pulling, shaking again, She has worsening rheumatoid arthritis, is receiving more frequent treatment, complains fatigue, diffuse body achy pain  Update March 15 2016: She responded very well to previous Dysport injection in April, no significant side effect noticed, only recent few weeks, 5 months from previous injection, she noticed return of head shaking neck muscle tightness.  UPDATE Dec 14th 2017: She responded very well  to previous injection in Sept 2017, no significant side effect noticed.  Update September 13 2016: She did well this last injection in December  Update January 15 2017: She noticed worsening head tremor, she had excessive stress,  her sister passed away from lung cancer in April 2018,  UPDATE Apr 25 2017: She responded very well with previous injection, barely has noticeable head shaking.  Update August 01, 2017: She responded very well to previous dysport injection, only recently began to have recurrent head shaking  UPDATE December 19 2017: She responded well to previous injection  UPDATE Sept 25 2019: She responded very well to previous injection  Update February 2022 2023  She responded well to previous injection, her rheumatoid arthritis is under excellent control,  She reported sudden onset of acute vertigo on July 22, 2020 turning to the right side, nausea, unsteady gait at the beginning of the onset, over the past few weeks, her symptoms has improved, but with sudden positional change, especially turning towards the right side, it will trigger transient dizziness, she denies hearing loss.  REVIEW OF SYSTEMS: Full 14 system review of systems performed and notable only for ear pain, joint pain, joint swelling, achy muscles, muscle cramps, rash, tremors, depression, anxiety  ALLERGIES: No Known Allergies  HOME MEDICATIONS: Current Outpatient Medications  Medication Sig Dispense Refill   AbobotulinumtoxinA (DYSPORT) 500 units SOLR injection INJECT 500 UNITS  INTRAMUSCULARLY EVERY 3  MONTHS (GIVEN AT MD OFFICE, DISCARD UNUSED) 1 each 3   aspirin EC 81 MG tablet Take 1 tablet (81 mg total) by mouth daily. Swallow whole. 90 tablet 3   clonazePAM (KLONOPIN) 0.5 MG tablet Take 1 tablet (0.5 mg total) by mouth 2 (two) times daily as needed. 60 tablet 2   DULoxetine (CYMBALTA) 60 MG capsule TAKE 1 CAPSULE(60 MG) BY MOUTH DAILY 30 capsule 2   Fluticasone-Umeclidin-Vilant (TRELEGY ELLIPTA) 200-62.5-25 MCG/ACT AEPB USE 1 INHALATION BY MOUTH DAILY 180 each 0   metoprolol succinate (TOPROL-XL) 25 MG 24 hr tablet Take 1 tablet (25 mg total) by mouth daily. 90 tablet 3   Multiple Vitamins-Minerals (MULTIVITAMINS THER.  W/MINERALS) TABS Take 1 tablet by mouth at bedtime.      omeprazole (PRILOSEC) 20 MG capsule TAKE 1 CAPSULE BY MOUTH DAILY 90 capsule 3   RINVOQ 15 MG TB24 TAKE 1 TABLET BY MOUTH DAILY 30 tablet 0   rosuvastatin (CRESTOR) 20 MG tablet Take 1 tablet (20 mg total) by mouth daily. 90 tablet 3   varenicline (CHANTIX CONTINUING MONTH PAK) 1 MG tablet Take 1 tablet (1 mg total) by mouth 2 (two) times daily. 60 tablet 5   Xylitol (XYLIMELTS MT) Use as directed 2 tablets in the mouth or throat at bedtime as needed (dry mouth).      Current Facility-Administered Medications  Medication Dose Route Frequency Provider Last Rate Last Admin   0.9 %  sodium chloride infusion  500 mL Intravenous Once Nandigam, Kavitha V, MD       AbobotulinumtoxinA (DYSPORT) 500 units injection 500 Units  500 Units Intramuscular Once Levert Feinstein, MD        PAST MEDICAL HISTORY: Past Medical History:  Diagnosis Date   Anxiety    Cervical dystonia    COPD (chronic obstructive pulmonary disease) (HCC) 10/2020   Depression    Fibromyalgia    GERD (gastroesophageal reflux disease)    History of shingles    RA (rheumatoid arthritis) (HCC)    Synovitis of hand    Vertigo  PAST SURGICAL HISTORY: Past Surgical History:  Procedure Laterality Date   COLONOSCOPY  10/25/2009   TONSILLECTOMY     VAGINAL HYSTERECTOMY  09/01/2006    FAMILY HISTORY: Family History  Problem Relation Age of Onset   Arthritis Mother    Heart Problems Father    Heart disease Father    Heart disease Paternal Uncle        MI   Colon cancer Neg Hx    Colon polyps Neg Hx    Esophageal cancer Neg Hx    Rectal cancer Neg Hx    Stomach cancer Neg Hx     SOCIAL HISTORY:  Social History   Socioeconomic History   Marital status: Married    Spouse name: Harvie Heck   Number of children: 2   Years of education: 12   Highest education level: 12th grade  Occupational History    Employer: [employer]  Tobacco Use   Smoking status: Former     Current packs/day: 0.00    Average packs/day: 0.3 packs/day for 43.4 years (10.8 ttl pk-yrs)    Types: Cigarettes    Start date: 59    Quit date: 11/16/2020    Years since quitting: 2.8    Passive exposure: Current   Smokeless tobacco: Never  Vaping Use   Vaping status: Never Used  Substance and Sexual Activity   Alcohol use: Yes    Alcohol/week: 0.0 standard drinks of alcohol    Comment: Rare, twice per year   Drug use: Never   Sexual activity: Yes    Birth control/protection: Surgical    Comment: 1st intercourse 63 yo-Fewer than 5 partners  Other Topics Concern   Not on file  Social History Narrative   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 Right handed.    Caffeine- 4 cups daily.   Social Drivers of Corporate investment banker Strain: Low Risk  (06/18/2023)   Overall Financial Resource Strain (CARDIA)    Difficulty of Paying Living Expenses: Not very hard  Food Insecurity: No Food Insecurity (06/18/2023)   Hunger Vital Sign    Worried About Running Out of Food in the Last Year: Never true    Ran Out of Food in the Last Year: Never true  Transportation Needs: No Transportation Needs (06/18/2023)   PRAPARE - Administrator, Civil Service (Medical): No    Lack of Transportation (Non-Medical): No  Physical Activity: Insufficiently Active (06/18/2023)   Exercise Vital Sign    Days of Exercise per Week: 5 days    Minutes of Exercise per Session: 10 min  Stress: No Stress Concern Present (06/18/2023)   [employer] of Occupational Health - Occupational Stress Questionnaire    Feeling of Stress : Only a little  Social Connections: Moderately Integrated (06/18/2023)   Social Connection and Isolation Panel [NHANES]    Frequency of Communication with Friends and Family: More than three times a week    Frequency of Social Gatherings with Friends and Family: Once a week     Attends Religious Services: 1 to 4 times per year    Active Member of [employer] or Organizations: No    Attends Banker Meetings: Not on file    Marital Status: Married  Intimate Partner Violence: Not on file     PHYSICAL EXAM   Vitals:   09/17/23 1314  BP: 114/62  Pulse: 81  Weight: 186 lb (84.4 kg)  Height: 5\' 6"  (1.676 m)     Gen: NAD, conversant, well nourised, well groomed             NEUROLOGICAL EXAM:  MENTAL STATUS: Speech/cognition: Awake, alert, oriented to history taking and casual conversation   CRANIAL NERVES: She has mild retrocollis, right tilt, slight left shoulder elevation CN II: Visual fields are full to confrontation.  Pupils are round equal and briskly reactive to light. CN III, IV, VI: extraocular movement are normal. No ptosis. CN V: Facial sensation is intact to pinprick in all 3 divisions bilaterally. Corneal responses are intact.  CN VII: Face is symmetric with normal eye closure and smile. CN VIII: Hearing is normal to casual conversation CN IX, X: Palate elevates symmetrically. Phonation is normal. CN XI: Head turning and shoulder shrug are intact  MOTOR: There is no pronator drift of out-stretched arms. Muscle bulk and tone are normal. Muscle strength is normal.  COORDINATION: Rapid alternating movements and fine finger movements are intact. There is no dysmetria on finger-to-nose and heel-knee-shin.    GAIT/STANCE: Posture is normal. Gait is steady    I performed Epley's maneuver, right ear dependent position after short latency will trigger rotatory downward beating nystagmus, habituate quickly, on the second repeat Epley's maneuver, the nystagmus and her vertigo sensation has much improved  DIAGNOSTIC DATA (LABS, IMAGING, TESTING) - I reviewed patient records, labs, notes, testing and imaging myself where available.  ASSESSMENT AND PLAN  Courtney Myers is a 63 y.o. female with long-standing history of cervical  dystonia, responded very well to previous EMG guided Dysport injection, last injection was May 2016, responded very well.   Mild retrocollis, right tilt, slight left shoulder elevation  500 of Dysport was dissolved into 2.5 cc of normal saline,  Right longissimus capitus 0. 5 cc Left splenius capitis 0.5 cc Right inferior oblique capitis, 0.5 cc  Left inferior oblique capitis 0.5 cc Left levator scapular 0.5   Benign positional vertigo  Symptom onset since July 22, 2021,  I performed Epley maneuver, right ear dependent position will trigger downward beating rotatory nystagmus, quickly improved after 2 repeat procedures   Levert Feinstein, M.D. Ph.D.  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  Symptom onset was without apparent triggering event, she has gradually developed this neck pulling,  manifested primarily with right laterocollis, mild retrocolis, and left shoulder elevation since 2000. She has good relief with regular Botox injection, about 4 times a year since 2003. Last injection was February 18, 2010 by Dr. Deneen Harts,  She reported great relief as usual, taking 4- 5 days to get symptom relieve, lasting about 3 months.  I began to inject her since 12.2011, every 3-4 months, dosage has been limited to 150 units of BOTOX A.  She denies neck pain, gait difficulty, but with wearing off Botox, she noticed increased head tremor, and pulling towards her right shoulder.  She developed  transient difficulty lifting her neck up when using BOTOX A 200 units previously, reponding well to decreased dose of 150 units  She was enrolled into IPSEN dysport study, first injection was in April 30th 2013, she later enrolled  into open label, 2nd injection was in  May 28th 2013. She did very well.  Recent few weeks,  She had experienced flareup of her rheumatoid arthritis, has received IV infusion. Last injection was in 06/10/2012,  500 units of dysport was dissolved into 2 cc of normal saline. Right longissimus capitus 0.5 cc Right splenius capitis 0.5 cc Right levator scapular 0.5 cc Right iliocostalis 0.5 cc   Dysport research study has completed, she is now coming back for repeat injection, she noticed head bobbing over the past few weeks, she denies significant neck pain, weakness,  Her rheumatoid arthritis is under better control   Last injection was in Sep 2014, she did very well, no neck extension weakness, only rarely neck shaking  UPDATE March 8th 2016: She has lost follow-up since her last visit December 2014, She came in for treatment her cervical dystonia, she feel the tension of her neck all the time, stiffness. Denied gait difficulty, last injection was September 2014, she received 500 units of Dysport, works well for her, she is continue on medication for anxiety, rheumatoid arthritis She also complains of nighttime snoring, frequent awakening, excessive daytime sleepiness, fatigue, today's ESS is 6, FSS was 69,  UPDATE October 13 2014:   She has missed her scheduled sleep study, continue have worsening neck posturing, posterior neck pain, excessive daytime fatigue and sleepiness  UPDATE Nov 11 2014: She came in for EMG guided Dysport injection today, last injection was December 2014, she complains of worsening head titubation, bilateral hands mild postural tremor, posterior neck pain,  Update February 17 2015 She responded very well to last EMG guided Dysport injection Nov 11 2014, no significant head titubation, no significant neck pain  Update July 22 2015: Last injection was August, now she noticed returning of neck pulling and shaking, posterior neck muscle achy pain  Update October 21 2015: Last EMG guided Dysport injection was in January, she responded very well, only recent couple weeks, she noticed returning of neck pulling, shaking again, She has worsening rheumatoid arthritis, is receiving more frequent treatment, complains fatigue, diffuse body achy pain  Update March 15 2016: She responded very well to previous Dysport injection in April, no significant side effect noticed, only recent few weeks, 5 months from previous injection, she noticed return of head shaking neck muscle tightness.  UPDATE Dec 14th 2017: She responded very well to previous injection in Sept 2017, no significant side effect noticed.  Update September 13 2016: She did well this last injection in December  Update January 15 2017: She noticed worsening head tremor, she had excessive stress, her sister passed away from lung cancer in April 2018,  UPDATE Apr 25 2017: She responded very well with previous injection, barely has noticeable head shaking.  Update August 01, 2017: She responded very well to previous dysport injection, only recently began to have recurrent head shaking  UPDATE December 19 2017: She responded well to previous injection  UPDATE Jun 20 2018: She did very well with previous injection  UPDATE October 01 2018: She did well to previous injection  UPDATE January 01 2019: She did well with previous injection  UPDATE Oct 28th 2020: She did well to previous injections  UPDATE Jan 27th 2021: She did well to previous injections  Update October 29, 2019: She did well with previous injection, no significant side effect noted.  Update February 04, 2020, Uses Dysport 500 units, responding well  UPDATE Jun 02 2020: Uses Dysport 500 units, responding well  Update September 28, 2020: She responded well to previous injection, unfortunately she has been a  longtime smoker, diagnosed with COPD, chest x-ray showed bibasilar atelectasis, and infiltration, small left pleural effusion on September 08, 2020  UPDATE December 01 2021: She did well with his previous injection,  UPDATE September 12 2021: She retired the end of October 2023, neck overall doing well well, only occasionally shaking, muscle tension  UPDATE January 01 2023: She responded well to previous injection  UPDATE Jun 20 2023: She responded well to previous injection no significant side effect noted,  UPDATE September 17 2023: Denied neck pain, but increased head titubation turning towards the right,  PHYSICAL EXAM   Today's Vitals   09/17/23 1314  BP: 114/62  Pulse: 81  Weight: 186 lb (84.4 kg)  Height: 5\' 6"  (1.676 m)    PHYSICAL EXAMNIATION:   Mild retrocollis, mild right tilt,\with mild left shoulder elevation  ASSESSMENT AND PLAN  Emberlin Verner is a 63 y.o. female with long-standing history of cervical dystonia, responded very well to EMG guided Dysport injection,  Mild retrocollis, right tilt, mild left shoulder elevation, occasionally no-no titubation  500 of Dysport was dissolved into 2.5 cc of normal saline,  Right splenius capitus 0.25 ccx2=0.5 Right levator scapular 0.25 cc Right splenius cervix 0.25 ccx2=0.5 cc  Left splenius cervix 0.25 Left splenius capitis 0.5 Left levator scapular 0.5 cc   Patient tolerate the injection well, will return to clinic in 3 months for repeat injection Please dissolve 500 units of Dysport into 2.5 cc of normal saline   Levert Feinstein, M.D. Ph.D.  [facility] [address], [address] [place], [place] [address] Ph: [phone] Fax: [phone]

## 2023-09-17 NOTE — Progress Notes (Signed)
 Dysport 500 units x 1 vial  [phone] [id number] Exp-04-01-2025 B/B or S/P Bacteriostatic 0.9% Sodium Chloride- 2.5 mL  Lot: [id number] Expiration: 05/03/2024 NDC: 4696-2952-84 Dx: G24.3 WITNESSED XL:[id number] H CMA

## 2023-09-17 NOTE — Telephone Encounter (Unsigned)
 Copied from CRM [id number]. Topic: Clinical - Prescription Issue >> Sep 17, 2023  2:18 PM Gildardo Pounds wrote: Reason for CRM: Carney Bern with [facility], the varenicline (CHANTIX CONTINUING MONTH PAK) 1 MG tablet is not available. The varenicline 1 MG tablet; 2 times a day with 56 tablets with 5 refills needs authorization for the medication to be dispensed with what the pharmacy has available. If not, the prescription needs to be filled at a local pharmacy for the patient. Callback [phone] ref # [id number]

## 2023-09-18 LAB — BASIC METABOLIC PANEL
BUN/Creatinine Ratio: 12 (ref 12–28)
BUN: 12 mg/dL (ref 8–27)
CO2: 21 mmol/L (ref 20–29)
Calcium: 9.3 mg/dL (ref 8.7–10.3)
Chloride: 99 mmol/L (ref 96–106)
Creatinine, Ser: 0.98 mg/dL (ref 0.57–1.00)
Glucose: 117 mg/dL — ABNORMAL HIGH (ref 70–99)
Potassium: 4.6 mmol/L (ref 3.5–5.2)
Sodium: 137 mmol/L (ref 134–144)
eGFR: 64 mL/min/{1.73_m2} (ref >59)

## 2023-09-18 LAB — CBC WITH DIFFERENTIAL/PLATELET
Absolute Lymphocytes: 2160 {cells}/uL (ref 850–3900)
Absolute Monocytes: 735 {cells}/uL (ref 200–950)
Basophils Absolute: 53 {cells}/uL (ref 0–200)
Basophils Relative: 0.7 %
Eosinophils Absolute: 83 {cells}/uL (ref 15–500)
Eosinophils Relative: 1.1 %
HCT: 38.4 % (ref 35.0–45.0)
Hemoglobin: 12.7 g/dL (ref 11.7–15.5)
MCH: 30.2 pg (ref 27.0–33.0)
MCHC: 33.1 g/dL (ref 32.0–36.0)
MCV: 91.2 fL (ref 80.0–100.0)
MPV: 10.8 fL (ref 7.5–12.5)
Monocytes Relative: 9.8 %
Neutro Abs: 4470 {cells}/uL (ref 1500–7800)
Neutrophils Relative %: 59.6 %
Platelets: 274 10*3/uL (ref 140–400)
RBC: 4.21 10*6/uL (ref 3.80–5.10)
RDW: 12.3 % (ref 11.0–15.0)
Total Lymphocyte: 28.8 %
WBC: 7.5 10*3/uL (ref 3.8–10.8)

## 2023-09-18 LAB — COMPLETE METABOLIC PANEL WITH GFR
AG Ratio: 1.6 (calc) (ref 1.0–2.5)
ALT: 18 U/L (ref 6–29)
AST: 23 U/L (ref 10–35)
Albumin: 4.1 g/dL (ref 3.6–5.1)
Alkaline phosphatase (APISO): 71 U/L (ref 37–153)
BUN: 12 mg/dL (ref 7–25)
CO2: 27 mmol/L (ref 20–32)
Calcium: 9.2 mg/dL (ref 8.6–10.4)
Chloride: 100 mmol/L (ref 98–110)
Creat: 1.05 mg/dL (ref 0.50–1.05)
Globulin: 2.5 g/dL (ref 1.9–3.7)
Glucose, Bld: 123 mg/dL — ABNORMAL HIGH (ref 65–99)
Potassium: 4.3 mmol/L (ref 3.5–5.3)
Sodium: 136 mmol/L (ref 135–146)
Total Bilirubin: 0.4 mg/dL (ref 0.2–1.2)
Total Protein: 6.6 g/dL (ref 6.1–8.1)
eGFR: 59 mL/min/{1.73_m2} — ABNORMAL LOW (ref >=60)

## 2023-09-18 NOTE — Progress Notes (Signed)
 CBC is normal.  Glucose is mildly elevated probably not a fasting sample.  Creatinine remains mildly elevated.  Please forward results to her PCP.

## 2023-09-20 ENCOUNTER — Encounter (HOSPITAL_COMMUNITY): Payer: Self-pay

## 2023-09-21 ENCOUNTER — Telehealth (HOSPITAL_COMMUNITY): Payer: Self-pay | Admitting: *Deleted

## 2023-09-21 NOTE — Telephone Encounter (Signed)
 Reaching out to patient to offer assistance regarding upcoming cardiac imaging study; pt verbalizes understanding of appt date/time, parking situation and where to check in, pre-test NPO status and medications ordered, and verified current allergies; name and call back number provided for further questions should they arise  Larey Brick RN Navigator Cardiac Imaging [facility] [phone] office [phone] cell  Patient to take an extra dose of her daily metoprolol succinate two hours prior to her cardiac CT scan. She is aware to arrive at 2 PM.

## 2023-09-24 ENCOUNTER — Ambulatory Visit (HOSPITAL_COMMUNITY)
Admission: RE | Admit: 2023-09-24 | Discharge: 2023-09-24 | Disposition: A | Discharge status: Home / Self Care | Source: Ambulatory Visit | Attending: Cardiology | Admitting: Cardiology

## 2023-09-24 DIAGNOSIS — R0609 Other forms of dyspnea: Secondary | ICD-10-CM | POA: Diagnosis present

## 2023-09-24 MED ORDER — NITROGLYCERIN 0.4 MG SL SUBL
SUBLINGUAL_TABLET | SUBLINGUAL | Status: AC
Start: 2023-09-24 — End: ?
  Filled 2023-09-24: qty 2

## 2023-09-24 MED ORDER — NITROGLYCERIN 0.4 MG SL SUBL
0.8000 mg | SUBLINGUAL_TABLET | Freq: Once | SUBLINGUAL | Status: AC
  Administered 2023-09-24: 0.8 mg via SUBLINGUAL

## 2023-09-24 MED ORDER — IOHEXOL 350 MG/ML SOLN
95.0000 mL | Freq: Once | INTRAVENOUS | Status: AC | PRN
  Administered 2023-09-24: 95 mL via INTRAVENOUS

## 2023-09-24 NOTE — Progress Notes (Signed)
 Patient tolerated CT well. Vital signs stable encourage to drink water throughout day.Reasons explained and verbalized understanding. Ambulated steady gait.

## 2023-09-28 ENCOUNTER — Other Ambulatory Visit: Payer: Self-pay | Admitting: *Deleted

## 2023-09-28 MED ORDER — DULOXETINE HCL 60 MG PO CPEP: 60.0000 mg | ORAL_CAPSULE | Freq: Every day | ORAL | 0 refills | Status: DC

## 2023-09-28 NOTE — Telephone Encounter (Signed)
 Refill request received via fax from [facility] for Cymbalta  Last Fill: 07/20/2023  Next Visit: 01/07/2024  Last Visit: 08/07/2023  Dx: Fibromyalgia   Current Dose per office note on 08/07/2023: Cymbalta 60 mg 1 capsule daily.   Okay to refill Cymbalta?

## 2023-10-01 ENCOUNTER — Encounter (HOSPITAL_BASED_OUTPATIENT_CLINIC_OR_DEPARTMENT_OTHER): Payer: Self-pay

## 2023-10-14 ENCOUNTER — Other Ambulatory Visit: Payer: Self-pay | Admitting: Physician Assistant

## 2023-10-16 ENCOUNTER — Other Ambulatory Visit: Payer: Self-pay | Admitting: Physician Assistant

## 2023-10-16 NOTE — Telephone Encounter (Signed)
 Last Fill: 09/14/2023 (30 day supply)  Labs: 09/17/2023 CBC is normal.  Glucose is mildly elevated probably not a fasting sample.  Creatinine remains mildly elevated.     TB Gold: 06/04/2023 Neg    Next Visit: 01/07/2024  Last Visit: 08/07/2023  DX: Rheumatoid arthritis of multiple sites with negative rheumatoid factor   Current Dose per office note 08/07/2023: Rinvoq 15 mg 1 tablet by mouth daily   Okay to refill Rinvoq?

## 2023-10-23 ENCOUNTER — Encounter: Payer: Self-pay | Admitting: Family Medicine

## 2023-10-24 ENCOUNTER — Other Ambulatory Visit: Payer: Self-pay

## 2023-10-24 DIAGNOSIS — Z716 Tobacco abuse counseling: Secondary | ICD-10-CM

## 2023-10-24 MED ORDER — VARENICLINE TARTRATE 1 MG PO TABS: 1.0000 mg | ORAL_TABLET | Freq: Two times a day (BID) | ORAL | 5 refills | Status: DC

## 2023-11-09 ENCOUNTER — Telehealth: Payer: Self-pay

## 2023-11-09 NOTE — Telephone Encounter (Signed)
 Prescription Request  11/09/2023  LOV: 05/26/23  What is the name of the medication or equipment? varenicline  (CHANTIX  CONTINUING MONTH PAK) 1 MG tablet [[id number]]   Have you contacted your pharmacy to request a refill? Yes   Which pharmacy would you like this sent to?  [facility] [place], [place] - [address] [place] [address] Phone: [phone] Fax: [phone]    Patient notified that their request is being sent to the clinical staff for review and that they should receive a response within 2 business days.   Please advise at [facility] [phone]

## 2023-11-09 NOTE — Telephone Encounter (Signed)
 [facility] called and spoke to [facility], Patient Care Coordinator about the refill(s) varenicline  requested. She says it was received and is in process and once it's completed it will be shipped for the patient to receive it between 5/15-18/25 and the patient will receive a call to verify shipping information.

## 2023-11-13 ENCOUNTER — Telehealth: Payer: Self-pay

## 2023-11-13 NOTE — Telephone Encounter (Signed)
 Sent MyChart message requesting new insurance info- [employer] inactive.

## 2023-11-13 NOTE — Telephone Encounter (Signed)
 [facility] contacted the office and requested updated insurance information on the patient. Contacted the patient and advised her to call [facility] at [phone] to give them her updated insurance information. Patient verbalized understanding.

## 2023-11-20 ENCOUNTER — Other Ambulatory Visit: Payer: Self-pay | Admitting: Family Medicine

## 2023-11-20 ENCOUNTER — Encounter: Payer: Self-pay | Admitting: Family Medicine

## 2023-11-20 MED ORDER — CLONAZEPAM 0.5 MG PO TABS: 0.5000 mg | ORAL_TABLET | Freq: Two times a day (BID) | ORAL | 2 refills | Status: DC | PRN

## 2023-12-04 ENCOUNTER — Telehealth: Payer: Self-pay | Admitting: Pharmacist

## 2023-12-04 ENCOUNTER — Other Ambulatory Visit: Payer: Self-pay | Admitting: Pharmacist

## 2023-12-04 ENCOUNTER — Other Ambulatory Visit (HOSPITAL_COMMUNITY): Payer: Self-pay

## 2023-12-04 NOTE — Telephone Encounter (Signed)
 Attempted to run PA renewal through [facility] for Rinvoq  but showing that member is not found.  MyChart sent to patient for update insurance information  Geraldene Kleine, PharmD, MPH, BCPS, CPP Clinical Pharmacist (Rheumatology and Pulmonology)

## 2023-12-10 ENCOUNTER — Ambulatory Visit: Payer: Self-pay | Admitting: Neurology

## 2023-12-17 NOTE — Telephone Encounter (Signed)
 Received completed patient forms via email. Provider form signed by Jacinta Martinis, PA-C  Submitted Patient Assistance Application to [employer] for RINVOQ  along with provider portion, patient portion, medication list, insurance card copy (medicare a/b only since patient does not have rx coverage). Will update patient when we receive a response.  Rinvoq  rx sent for one month supply only because patient is due for labs  Phone: [phone] Fax: [phone]  Geraldene Kleine, PharmD, MPH, BCPS, CPP Clinical Pharmacist (Rheumatology and Pulmonology)

## 2023-12-19 NOTE — Telephone Encounter (Signed)
 Received a fax from  [employer] regarding an approval for RINVOQ  patient assistance from 12/18/2023 to 07/02/2024. Approval letter sent to scan center.  Phone: [phone] Fax: [phone]  Patient aware of approval.  Geraldene Kleine, PharmD, MPH, BCPS, CPP Clinical Pharmacist (Rheumatology and Pulmonology)

## 2023-12-24 ENCOUNTER — Other Ambulatory Visit: Payer: Self-pay | Admitting: *Deleted

## 2023-12-24 DIAGNOSIS — Z79899 Other long term (current) drug therapy: Secondary | ICD-10-CM

## 2023-12-24 NOTE — Progress Notes (Deleted)
 Office Visit Note  Patient: Courtney Myers             Date of Birth: 06-May-1961           MRN: [id number]             PCP: [name], MD Referring: [name], MD Visit Date: 01/07/2024 Occupation: @GUAROCC @  Subjective:    History of Present Illness: Courtney Myers is a 63 y.o. female with history of seronegative rheumatoid arthritis and fibromyalgia.  Patient is currently taking Rinvoq  15 mg 1 tablet by mouth daily.    CBC and CMP were drawn on 12/24/2023--within normal limits TB Gold negative on 07/30/2023. Discussed the importance of holding Rinvoq  if she develops signs or symptoms of an infection and to resume once the infection is completely cleared.   Activities of Daily Living:  Patient reports morning stiffness for *** {minute/hour:19697}.   Patient {ACTIONS;DENIES/REPORTS:21021675::Denies} nocturnal pain.  Difficulty dressing/grooming: {ACTIONS;DENIES/REPORTS:21021675::Denies} Difficulty climbing stairs: {ACTIONS;DENIES/REPORTS:21021675::Denies} Difficulty getting out of chair: {ACTIONS;DENIES/REPORTS:21021675::Denies} Difficulty using hands for taps, buttons, cutlery, and/or writing: {ACTIONS;DENIES/REPORTS:21021675::Denies}  No Rheumatology ROS completed.   PMFS History:  Patient Active Problem List   Diagnosis Date Noted   History of tobacco abuse 02/26/2022   Family history of heart disease 02/26/2022   PVC (premature ventricular contraction) 02/26/2022   Cardiomyopathy (HCC) 02/26/2022   Benign paroxysmal positional vertigo of right ear 08/25/2021   COPD (chronic obstructive pulmonary disease) (HCC) 11/16/2020   Encounter for screening for lung cancer 12/29/2019   Excessive sleepiness 09/08/2014   Fibromyalgia syndrome 08/25/2014   RA (rheumatoid arthritis) (HCC)    GERD (gastroesophageal reflux disease)    Anxiety    Depression    History of shingles    Synovitis of hand    Cervical dystonia 12/04/2012    Past  Medical History:  Diagnosis Date   Anxiety    Cervical dystonia    COPD (chronic obstructive pulmonary disease) (HCC) 10/2020   Depression    Fibromyalgia    GERD (gastroesophageal reflux disease)    History of shingles    RA (rheumatoid arthritis) (HCC)    Synovitis of hand    Vertigo     Family History  Problem Relation Age of Onset   Arthritis Mother    Heart Problems Father    Heart disease Father    Heart disease Paternal Uncle        MI   Colon cancer Neg Hx    Colon polyps Neg Hx    Esophageal cancer Neg Hx    Rectal cancer Neg Hx    Stomach cancer Neg Hx    Past Surgical History:  Procedure Laterality Date   COLONOSCOPY  10/25/2009   TONSILLECTOMY     VAGINAL HYSTERECTOMY  09/01/2006   Social History   Social History Narrative   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 Right handed.    Caffeine- 4 cups daily.   Immunization History  Administered Date(s) Administered   Influenza, Seasonal, Injecte, Preservative Fre 06/19/2023   Influenza,inj,Quad PF,6+ Mos 05/25/2018, 04/03/2019, 04/26/2020   Influenza-Unspecified 06/24/2014, 05/01/2015, 04/10/2016, 04/06/2017, 03/03/2022   Moderna Sars-Covid-2 Vaccination 08/29/2019, 09/26/2019, 06/19/2020   PNEUMOCOCCAL CONJUGATE-20 10/03/2021   Pneumococcal Conjugate Pcv21, Polysaccharide Crm197 Conjugaf 06/19/2023   Pneumococcal Conjugate-13 05/01/2015   Pneumococcal Polysaccharide-23 04/10/2016   Tdap 09/09/2015     Objective: Vital Signs: There were no vitals taken for this visit.  Physical Exam Vitals and nursing note reviewed.  Constitutional:      Appearance: She is well-developed.  HENT:     Head: Normocephalic and atraumatic.  Eyes:     Conjunctiva/sclera: Conjunctivae normal.  Cardiovascular:     Rate and Rhythm: Normal rate and regular rhythm.     Heart sounds: Normal heart sounds.  Pulmonary:     Effort: Pulmonary  effort is normal.     Breath sounds: Normal breath sounds.  Abdominal:     General: Bowel sounds are normal.     Palpations: Abdomen is soft.  Musculoskeletal:     Cervical back: Normal range of motion.  Lymphadenopathy:     Cervical: No cervical adenopathy.  Skin:    General: Skin is warm and dry.     Capillary Refill: Capillary refill takes less than 2 seconds.  Neurological:     Mental Status: She is alert and oriented to person, place, and time.  Psychiatric:        Behavior: Behavior normal.      Musculoskeletal Exam: ***  CDAI Exam: CDAI Score: -- Patient Global: --; Provider Global: -- Swollen: --; Tender: -- Joint Exam 01/07/2024   No joint exam has been documented for this visit   There is currently no information documented on the homunculus. Go to the Rheumatology activity and complete the homunculus joint exam.  Investigation: No additional findings.  Imaging: No results found.  Recent Labs: Lab Results  Component Value Date   WBC 7.5 09/17/2023   HGB 12.7 09/17/2023   PLT 274 09/17/2023   NA 136 09/17/2023   K 4.3 09/17/2023   CL 100 09/17/2023   CO2 27 09/17/2023   GLUCOSE 123 (H) 09/17/2023   BUN 12 09/17/2023   CREATININE 1.05 09/17/2023   BILITOT 0.4 09/17/2023   ALKPHOS 91 11/18/2020   AST 23 09/17/2023   ALT 18 09/17/2023   PROT 6.6 09/17/2023   ALBUMIN 4.2 11/18/2020   CALCIUM  9.2 09/17/2023   GFRAA 61 11/02/2020   QFTBGOLD Negative 09/14/2015   QFTBGOLDPLUS NEGATIVE 06/04/2023    Speciality Comments: Remicade  6mg /kg every 6 weeks 07/13-09/21 Rinvoq  15 mg qd 04/28/20 MTX - dcd due to increase in Cr Arava  started 06/21/20-12/22- increase in cr   Procedures:  No procedures performed Allergies: Patient has no known allergies.   Assessment / Plan:     Visit Diagnoses: Rheumatoid arthritis of multiple sites with negative rheumatoid factor (HCC)  High risk medication use  Sicca complex (HCC)  Chronic pain of both  knees  Fibromyalgia  Chronic fatigue  History of gastroesophageal reflux (GERD)  Anxiety and depression  History of COPD  Former smoker  Primary insomnia  Orders: No orders of the defined types were placed in this encounter.  No orders of the defined types were placed in this encounter.   Face-to-face time spent with patient was *** minutes. Greater than 50% of time was spent in counseling and coordination of care.  Follow-Up Instructions: No follow-ups on file.   Waddell CHRISTELLA Craze, PA-C  Note - This record has been created using Dragon software.  Chart creation errors have been sought, but may not always  have been located. Such creation errors do not reflect on  the standard of medical care.

## 2023-12-25 ENCOUNTER — Ambulatory Visit: Payer: Self-pay | Admitting: Physician Assistant

## 2023-12-25 LAB — CBC WITH DIFFERENTIAL/PLATELET
Absolute Lymphocytes: 1875 {cells}/uL (ref 850–3900)
Absolute Monocytes: 740 {cells}/uL (ref 200–950)
Basophils Absolute: 69 {cells}/uL (ref 0–200)
Basophils Relative: 0.8 %
Eosinophils Absolute: 69 {cells}/uL (ref 15–500)
Eosinophils Relative: 0.8 %
HCT: 40.4 % (ref 35.0–45.0)
Hemoglobin: 13.1 g/dL (ref 11.7–15.5)
MCH: 30 pg (ref 27.0–33.0)
MCHC: 32.4 g/dL (ref 32.0–36.0)
MCV: 92.4 fL (ref 80.0–100.0)
MPV: 10.9 fL (ref 7.5–12.5)
Monocytes Relative: 8.6 %
Neutro Abs: 5848 {cells}/uL (ref 1500–7800)
Neutrophils Relative %: 68 %
Platelets: 247 10*3/uL (ref 140–400)
RBC: 4.37 10*6/uL (ref 3.80–5.10)
RDW: 12.5 % (ref 11.0–15.0)
Total Lymphocyte: 21.8 %
WBC: 8.6 10*3/uL (ref 3.8–10.8)

## 2023-12-25 LAB — COMPLETE METABOLIC PANEL WITHOUT GFR
AG Ratio: 1.9 (calc) (ref 1.0–2.5)
ALT: 19 U/L (ref 6–29)
AST: 21 U/L (ref 10–35)
Albumin: 4.3 g/dL (ref 3.6–5.1)
Alkaline phosphatase (APISO): 76 U/L (ref 37–153)
BUN: 15 mg/dL (ref 7–25)
CO2: 25 mmol/L (ref 20–32)
Calcium: 9.5 mg/dL (ref 8.6–10.4)
Chloride: 104 mmol/L (ref 98–110)
Creat: 0.94 mg/dL (ref 0.50–1.05)
Globulin: 2.3 g/dL (ref 1.9–3.7)
Glucose, Bld: 96 mg/dL (ref 65–99)
Potassium: 4.3 mmol/L (ref 3.5–5.3)
Sodium: 137 mmol/L (ref 135–146)
Total Bilirubin: 0.4 mg/dL (ref 0.2–1.2)
Total Protein: 6.6 g/dL (ref 6.1–8.1)

## 2024-01-07 ENCOUNTER — Ambulatory Visit: Payer: 59 | Admitting: Physician Assistant

## 2024-01-07 ENCOUNTER — Telehealth: Payer: Self-pay

## 2024-01-07 DIAGNOSIS — Z87891 Personal history of nicotine dependence: Secondary | ICD-10-CM

## 2024-01-07 DIAGNOSIS — Z79899 Other long term (current) drug therapy: Secondary | ICD-10-CM

## 2024-01-07 DIAGNOSIS — Z8709 Personal history of other diseases of the respiratory system: Secondary | ICD-10-CM

## 2024-01-07 DIAGNOSIS — R5382 Chronic fatigue, unspecified: Secondary | ICD-10-CM

## 2024-01-07 DIAGNOSIS — M797 Fibromyalgia: Secondary | ICD-10-CM

## 2024-01-07 DIAGNOSIS — M0609 Rheumatoid arthritis without rheumatoid factor, multiple sites: Secondary | ICD-10-CM

## 2024-01-07 DIAGNOSIS — F419 Anxiety disorder, unspecified: Secondary | ICD-10-CM

## 2024-01-07 DIAGNOSIS — Z8719 Personal history of other diseases of the digestive system: Secondary | ICD-10-CM

## 2024-01-07 DIAGNOSIS — G8929 Other chronic pain: Secondary | ICD-10-CM

## 2024-01-07 DIAGNOSIS — M3500 Sicca syndrome, unspecified: Secondary | ICD-10-CM

## 2024-01-07 DIAGNOSIS — F5101 Primary insomnia: Secondary | ICD-10-CM

## 2024-01-07 NOTE — Telephone Encounter (Signed)
 Spoke with patient to schedule 6 month follow up scan. Pt states she is currently uninsured. States her Medicare got messed up. She is awaiting her new cards. Pt request call back in August to schedule scan.

## 2024-01-08 ENCOUNTER — Other Ambulatory Visit: Payer: Self-pay

## 2024-01-08 MED ORDER — RINVOQ 15 MG PO TB24: 1.0000 | ORAL_TABLET | Freq: Every day | ORAL | 0 refills | Status: DC

## 2024-01-08 NOTE — Progress Notes (Unsigned)
 Office Visit Note  Patient: Courtney Myers             Date of Birth: June 21, 1961           MRN: [id number]             PCP: [name], MD Referring: [name], MD Visit Date: 01/17/2024 Occupation: @GUAROCC @  Subjective:  Medication monitoring  History of Present Illness: Ayianna Darnold is a 63 y.o. female with history of seronegative rheumatoid arthritis and fibromyalgia.  Patient is currently taking Rinvoq  15 mg 1 tablet by mouth daily.  She continues to tolerate Rinvoq  without any side effects and has not had any recent gaps in therapy.  Patient states that her mother recently passed away so she has been grieving the loss.  She has been working with a Camera operator but states that her fibromyalgia has been flaring due to the stress and grief she is under.  She remains on Cymbalta  60 mg 1 capsule daily but states that she has had a lapse of insurance and will need to pay for a month supply out-of-pocket. Patient states she has been experiencing increased pain and stiffness in the left index finger.  She denies any joint swelling.  She denies any injury prior to the onset of symptoms.  Patient states that prior to starting Rinvoq  she had significant pain and inflammation in this finger.  She denies any recurrence of extensor tenosynovitis.  Overall she continues to find Rinvoq  to be effective at managing her symptoms.    Activities of Daily Living:  Patient reports morning stiffness for 10 minutes.   Patient Denies nocturnal pain.  Difficulty dressing/grooming: Denies Difficulty climbing stairs: Reports Difficulty getting out of chair: Denies Difficulty using hands for taps, buttons, cutlery, and/or writing: Denies  Review of Systems  Constitutional:  Positive for fatigue.  HENT:  Positive for mouth sores and mouth dryness.   Eyes:  Negative for dryness.  Respiratory:  Negative for shortness of breath.   Cardiovascular:  Negative for chest pain and  palpitations.  Gastrointestinal:  Negative for blood in stool, constipation and diarrhea.  Endocrine: Positive for increased urination.  Genitourinary:  Negative for involuntary urination.  Musculoskeletal:  Positive for joint pain, gait problem, joint pain, joint swelling, myalgias, muscle weakness, morning stiffness, muscle tenderness and myalgias.  Skin:  Negative for color change, rash, hair loss and sensitivity to sunlight.  Allergic/Immunologic: Negative for susceptible to infections.  Neurological:  Positive for dizziness. Negative for headaches.  Hematological:  Negative for swollen glands.  Psychiatric/Behavioral:  Positive for depressed mood. Negative for sleep disturbance. The patient is nervous/anxious.     PMFS History:  Patient Active Problem List   Diagnosis Date Noted   History of tobacco abuse 02/26/2022   Family history of heart disease 02/26/2022   PVC (premature ventricular contraction) 02/26/2022   Cardiomyopathy (HCC) 02/26/2022   Benign paroxysmal positional vertigo of right ear 08/25/2021   COPD (chronic obstructive pulmonary disease) (HCC) 11/16/2020   Encounter for screening for lung cancer 12/29/2019   Excessive sleepiness 09/08/2014   Fibromyalgia syndrome 08/25/2014   RA (rheumatoid arthritis) (HCC)    GERD (gastroesophageal reflux disease)    Anxiety    Depression    History of shingles    Synovitis of hand    Cervical dystonia 12/04/2012    Past Medical History:  Diagnosis Date   Anxiety    Cervical dystonia    COPD (chronic obstructive pulmonary disease) (HCC)  10/2020   Depression    Fibromyalgia    GERD (gastroesophageal reflux disease)    History of shingles    RA (rheumatoid arthritis) (HCC)    Synovitis of hand    Vertigo     Family History  Problem Relation Age of Onset   Arthritis Mother    Heart Problems Father    Heart disease Father    Heart disease Paternal Uncle        MI   Colon cancer Neg Hx    Colon polyps Neg Hx     Esophageal cancer Neg Hx    Rectal cancer Neg Hx    Stomach cancer Neg Hx    Past Surgical History:  Procedure Laterality Date   COLONOSCOPY  10/25/2009   TONSILLECTOMY     VAGINAL HYSTERECTOMY  09/01/2006   Social History   Social History Narrative   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 Right handed.    Caffeine- 4 cups daily.   Immunization History  Administered Date(s) Administered   Influenza, Seasonal, Injecte, Preservative Fre 06/19/2023   Influenza,inj,Quad PF,6+ Mos 05/25/2018, 04/03/2019, 04/26/2020   Influenza-Unspecified 06/24/2014, 05/01/2015, 04/10/2016, 04/06/2017, 03/03/2022   Moderna Sars-Covid-2 Vaccination 08/29/2019, 09/26/2019, 06/19/2020   PNEUMOCOCCAL CONJUGATE-20 10/03/2021   Pneumococcal Conjugate Pcv21, Polysaccharide Crm197 Conjugaf 06/19/2023   Pneumococcal Conjugate-13 05/01/2015   Pneumococcal Polysaccharide-23 04/10/2016   Tdap 09/09/2015     Objective: Vital Signs: BP 100/64 (BP Location: Left Arm, Patient Position: Sitting, Cuff Size: Normal)   Pulse 82   Resp 16   Ht 5' 6 (1.676 m)   Wt 190 lb (86.2 kg)   BMI 30.67 kg/m    Physical Exam Vitals and nursing note reviewed.  Constitutional:      Appearance: She is well-developed.  HENT:     Head: Normocephalic and atraumatic.  Eyes:     Conjunctiva/sclera: Conjunctivae normal.  Cardiovascular:     Rate and Rhythm: Normal rate and regular rhythm.     Heart sounds: Normal heart sounds.  Pulmonary:     Effort: Pulmonary effort is normal.     Breath sounds: Normal breath sounds.  Abdominal:     General: Bowel sounds are normal.     Palpations: Abdomen is soft.  Musculoskeletal:     Cervical back: Normal range of motion.  Lymphadenopathy:     Cervical: No cervical adenopathy.  Skin:    General: Skin is warm and dry.     Capillary Refill: Capillary refill takes less than 2 seconds.  Neurological:      Mental Status: She is alert and oriented to person, place, and time.  Psychiatric:        Behavior: Behavior normal.      Musculoskeletal Exam: C-spine, thoracic spine, lumbar spine have good range of motion.  Shoulder joints, elbow joints, wrist joints and MCPs, PIPs, DIPs have good range of motion with no synovitis.  Complete fist formation bilaterally.  Tenderness of the left second MCP and PIP joint.  No synovitis noted.  Hip joints have good range of motion no groin pain.  Knee joints have good range of motion no warmth or effusion.  Ankle joints have good range of motion with no tenderness or joint swelling.  CDAI Exam: CDAI Score: -- Patient Global: --; Provider Global: -- Swollen: 0 ; Tender: 2  Joint Exam 01/17/2024      Right  Left  MCP 2  Tender  PIP 2 (finger)      Tender     Investigation: No additional findings.  Imaging: No results found.  Recent Labs: Lab Results  Component Value Date   WBC 8.6 12/24/2023   HGB 13.1 12/24/2023   PLT 247 12/24/2023   NA 137 12/24/2023   K 4.3 12/24/2023   CL 104 12/24/2023   CO2 25 12/24/2023   GLUCOSE 96 12/24/2023   BUN 15 12/24/2023   CREATININE 0.94 12/24/2023   BILITOT 0.4 12/24/2023   ALKPHOS 91 11/18/2020   AST 21 12/24/2023   ALT 19 12/24/2023   PROT 6.6 12/24/2023   ALBUMIN 4.2 11/18/2020   CALCIUM  9.5 12/24/2023   GFRAA 61 11/02/2020   QFTBGOLD Negative 09/14/2015   QFTBGOLDPLUS NEGATIVE 06/04/2023    Speciality Comments: Remicade  6mg /kg every 6 weeks 07/13-09/21 Rinvoq  15 mg qd 04/28/20 MTX - dcd due to increase in Cr Arava  started 06/21/20-12/22- increase in cr   Procedures:  No procedures performed Allergies: Patient has no known allergies.   Assessment / Plan:     Visit Diagnoses: Rheumatoid arthritis of multiple sites with negative rheumatoid factor [facility]): Patient presents today with increased pain in the left index finger.  She has tenderness over the left second MCP and PIP joint  but no synovitis was noted.  No recurrence of extensor tenosynovitis noted.  She has been taking Rinvoq  15 mg 1 tablet by mouth daily.  She continues to tolerate Rinvoq  without any side effects or gaps in therapy.  No recent or recurrent infections.  Patient has been grieving the loss of her mother and has been having more frequent fibromyalgia flares.  A refill of Cymbalta  was provided to the patient today.  She will remain on rinvoq  as prescribed.  She was advised to notify us  if she develops any new or worsening symptoms.  She will follow up in 5 months or sooner if needed.  High risk medication use - Rinvoq  15 mg 1 tablet by mouth daily (started 04/28/20).  Previous therapy: Remicade , Arava , methotrexate . CBC and CMP were drawn on 12/24/2023--within normal limits.  Her next lab work will be due in September and every 3 months.  Lipid panel on 07/30/2023. TB gold negative on 06/04/23  No recurrent infections.  Discussed the importance of holding Rinvoq  if she develops signs or symptoms of an infection and to resume once the infection is completely cleared.  Sicca complex [facility]): Chronic mouth dryness. She uses xylimelts as needed for symptomatic relief.  Chronic pain of both knees: She has good range of motion of both knee joints on examination today.  No warmth or effusion noted.  Fibromyalgia: She has been experiencing more frequent episodes of myofascial pain and fatigue which she attributes to fibromyalgia flares.  She has been grieving the loss of her mother which she feels has contributed to these flares of fibromyalgia.  Discussed the importance of regular exercise and good sleep hygiene.  She has been working with a Camera operator which has been helpful.  She will remain on Cymbalta  60 mg 1 capsule daily.  A printed prescription for Cymbalta  was provided to the patient today along with a GoodRx coupon since she has had a lapse in insurance.  Chronic fatigue: Chronic, stable.  Discussed  importance of regular exercise.  Other medical conditions are listed as follows:  History of gastroesophageal reflux (GERD)  Anxiety and depression - She remains on Cymbalta  60 mg 1 capsule daily.   History of COPD  Former smoker  Primary insomnia  Orders: No orders of the defined types were placed in this encounter.  Meds ordered this encounter  Medications   DULoxetine  (CYMBALTA ) 60 MG capsule    Sig: Take 1 capsule (60 mg total) by mouth daily.    Dispense:  30 capsule    Refill:  0    Follow-Up Instructions: Return in about 5 months (around 06/18/2024) for Rheumatoid arthritis.   Waddell CHRISTELLA Craze, PA-C  Note - This record has been created using Dragon software.  Chart creation errors have been sought, but may not always  have been located. Such creation errors do not reflect on  the standard of medical care.

## 2024-01-08 NOTE — Telephone Encounter (Signed)
 Last Fill: 10/16/2023  Labs: 12/24/2023 CBC and CMP WNL   TB Gold: 06/04/2023 TB Gold Negative   Next Visit: 01/17/2024  Last Visit: 08/07/2023  DX: Rheumatoid arthritis of multiple sites with negative rheumatoid factor   Current Dose per office note 08/07/2023: Rinvoq  15 mg 1 tablet by mouth daily   Okay to refill Rinvoq ?

## 2024-01-15 ENCOUNTER — Other Ambulatory Visit: Payer: Self-pay

## 2024-01-15 ENCOUNTER — Telehealth: Payer: Self-pay | Admitting: Family Medicine

## 2024-01-15 DIAGNOSIS — K219 Gastro-esophageal reflux disease without esophagitis: Secondary | ICD-10-CM

## 2024-01-15 MED ORDER — OMEPRAZOLE 20 MG PO CPDR: 20.0000 mg | DELAYED_RELEASE_CAPSULE | Freq: Every day | ORAL | 3 refills | Status: DC

## 2024-01-15 NOTE — Telephone Encounter (Signed)
 Last Fill: 09/28/2023  Next Visit: 01/17/2024  Last Visit: 08/07/2023  Dx: Rheumatoid arthritis of multiple sites with negative rheumatoid factor   Current Dose per office note on 08/07/2023: Cymbalta  60 mg 1 capsule daily   Okay to refill Cymbalta ?

## 2024-01-15 NOTE — Telephone Encounter (Signed)
 Prescription Request  01/15/2024  LOV: 06/19/2023  What is the name of the medication or equipment?   omeprazole  (PRILOSEC) 20 MG capsule  **new script request**  Have you contacted your pharmacy to request a refill? Yes   Which pharmacy would you like this sent to?  [facility] - [place], [place] - [address] [place] [address] Phone: [phone] Fax: [phone]    Patient notified that their request is being sent to the clinical staff for review and that they should receive a response within 2 business days.   Please advise pharmacist.

## 2024-01-16 MED ORDER — DULOXETINE HCL 60 MG PO CPEP: 60.0000 mg | ORAL_CAPSULE | Freq: Every day | ORAL | 0 refills | Status: DC

## 2024-01-17 ENCOUNTER — Other Ambulatory Visit: Payer: Self-pay | Admitting: Family Medicine

## 2024-01-17 ENCOUNTER — Telehealth: Payer: Self-pay | Admitting: Family Medicine

## 2024-01-17 ENCOUNTER — Encounter: Payer: Self-pay | Admitting: Physician Assistant

## 2024-01-17 ENCOUNTER — Ambulatory Visit: Payer: Self-pay | Attending: Physician Assistant | Admitting: Physician Assistant

## 2024-01-17 VITALS — BP 100/64 | HR 82 | Resp 16 | Ht 66.0 in | Wt 190.0 lb

## 2024-01-17 DIAGNOSIS — F5101 Primary insomnia: Secondary | ICD-10-CM | POA: Insufficient documentation

## 2024-01-17 DIAGNOSIS — Z8709 Personal history of other diseases of the respiratory system: Secondary | ICD-10-CM | POA: Diagnosis present

## 2024-01-17 DIAGNOSIS — Z79899 Other long term (current) drug therapy: Secondary | ICD-10-CM | POA: Insufficient documentation

## 2024-01-17 DIAGNOSIS — F32A Depression, unspecified: Secondary | ICD-10-CM | POA: Diagnosis present

## 2024-01-17 DIAGNOSIS — Z8719 Personal history of other diseases of the digestive system: Secondary | ICD-10-CM | POA: Insufficient documentation

## 2024-01-17 DIAGNOSIS — G8929 Other chronic pain: Secondary | ICD-10-CM | POA: Diagnosis present

## 2024-01-17 DIAGNOSIS — M0609 Rheumatoid arthritis without rheumatoid factor, multiple sites: Secondary | ICD-10-CM | POA: Insufficient documentation

## 2024-01-17 DIAGNOSIS — M25562 Pain in left knee: Secondary | ICD-10-CM | POA: Insufficient documentation

## 2024-01-17 DIAGNOSIS — F419 Anxiety disorder, unspecified: Secondary | ICD-10-CM | POA: Insufficient documentation

## 2024-01-17 DIAGNOSIS — R5382 Chronic fatigue, unspecified: Secondary | ICD-10-CM | POA: Diagnosis present

## 2024-01-17 DIAGNOSIS — M797 Fibromyalgia: Secondary | ICD-10-CM | POA: Diagnosis present

## 2024-01-17 DIAGNOSIS — M25561 Pain in right knee: Secondary | ICD-10-CM | POA: Diagnosis present

## 2024-01-17 DIAGNOSIS — Z87891 Personal history of nicotine dependence: Secondary | ICD-10-CM | POA: Diagnosis present

## 2024-01-17 DIAGNOSIS — M3500 Sicca syndrome, unspecified: Secondary | ICD-10-CM | POA: Insufficient documentation

## 2024-01-17 MED ORDER — CLONAZEPAM 0.5 MG PO TABS: 0.5000 mg | ORAL_TABLET | Freq: Two times a day (BID) | ORAL | 2 refills | Status: DC | PRN

## 2024-01-17 MED ORDER — DULOXETINE HCL 60 MG PO CPEP: 60.0000 mg | ORAL_CAPSULE | Freq: Every day | ORAL | 0 refills | Status: DC

## 2024-01-17 NOTE — Telephone Encounter (Signed)
 Prescription Request  01/17/2024  LOV: 06/19/2023  What is the name of the medication or equipment?   clonazePAM  (KLONOPIN ) 0.5 MG tablet   Have you contacted your pharmacy to request a refill? Yes   Which pharmacy would you like this sent to?  [facility] - [place], [place] - [address] [place] [address] Phone: [phone] Fax: [phone]    Patient notified that their request is being sent to the clinical staff for review and that they should receive a response within 2 business days.   Please advise pharmacist.

## 2024-01-18 ENCOUNTER — Other Ambulatory Visit: Payer: Self-pay

## 2024-01-18 DIAGNOSIS — I700 Atherosclerosis of aorta: Secondary | ICD-10-CM

## 2024-01-18 DIAGNOSIS — I429 Cardiomyopathy, unspecified: Secondary | ICD-10-CM

## 2024-01-18 DIAGNOSIS — I2510 Atherosclerotic heart disease of native coronary artery without angina pectoris: Secondary | ICD-10-CM

## 2024-01-18 DIAGNOSIS — I493 Ventricular premature depolarization: Secondary | ICD-10-CM

## 2024-01-18 MED ORDER — ROSUVASTATIN CALCIUM 20 MG PO TABS: 20.0000 mg | ORAL_TABLET | Freq: Every day | ORAL | 1 refills | Status: DC

## 2024-01-18 MED ORDER — METOPROLOL SUCCINATE ER 25 MG PO TB24: 25.0000 mg | ORAL_TABLET | Freq: Every day | ORAL | 1 refills | Status: DC

## 2024-01-24 ENCOUNTER — Telehealth: Payer: Self-pay | Admitting: Pharmacist

## 2024-01-24 DIAGNOSIS — J439 Emphysema, unspecified: Secondary | ICD-10-CM

## 2024-01-24 MED ORDER — TRELEGY ELLIPTA 200-62.5-25 MCG/ACT IN AEPB: 1.0000 | INHALATION_SPRAY | Freq: Every day | RESPIRATORY_TRACT | 0 refills | Status: DC

## 2024-01-24 NOTE — Telephone Encounter (Signed)
 Submitted Patient Assistance Application to [employer] for [facility] along with provider portion, patient portion and rx . Will update patient when we receive a response.  Phone: [phone] Fax: [phone]  Sherry Pennant, PharmD, MPH, BCPS, CPP Clinical Pharmacist (Rheumatology and Pulmonology)

## 2024-01-28 ENCOUNTER — Other Ambulatory Visit: Payer: Self-pay

## 2024-01-28 ENCOUNTER — Encounter: Payer: Self-pay | Admitting: Neurology

## 2024-01-28 MED ORDER — RINVOQ 15 MG PO TB24: 1.0000 | ORAL_TABLET | Freq: Every day | ORAL | 0 refills | Status: DC

## 2024-01-28 NOTE — Telephone Encounter (Signed)
 Patient contacted the office to have her medication sent to [facility], was previously sent to [facility]. Can we please update it.

## 2024-01-29 NOTE — Telephone Encounter (Signed)
 Pt has a new traditional Medicare A+B plan with an [employer] supplement. These do not require PA, she will be B/B.

## 2024-02-04 ENCOUNTER — Encounter: Payer: Self-pay | Admitting: Acute Care

## 2024-02-13 ENCOUNTER — Ambulatory Visit: Payer: Self-pay | Admitting: Neurology

## 2024-02-13 VITALS — BP 115/75 | HR 87

## 2024-02-13 DIAGNOSIS — G243 Spasmodic torticollis: Secondary | ICD-10-CM

## 2024-02-13 DIAGNOSIS — H8111 Benign paroxysmal vertigo, right ear: Secondary | ICD-10-CM

## 2024-02-13 MED ORDER — ABOBOTULINUMTOXINA 500 UNITS IM SOLR
500.0000 [IU] | Freq: Once | INTRAMUSCULAR | Status: AC
  Administered 2024-02-13 (×2): 500 [IU] via INTRAMUSCULAR

## 2024-02-13 NOTE — Progress Notes (Signed)
 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  Symptom onset was without apparent triggering event, she has gradually developed this neck pulling,  manifested primarily with right laterocollis, mild retrocolis, and left shoulder elevation since 2000. She has good relief with regular Botox injection, about 4 times a year since 2003. Last injection was February 18, 2010 by Dr. Ozell Hollow,  She reported great relief as usual, taking 4- 5 days to get symptom relieve, lasting about 3 months.  I began to inject her since 12.2011, every 3-4 months, dosage has been limited to 150 units of BOTOX A.  She denies neck pain, gait difficulty, but with wearing off Botox, she noticed increased head tremor, and pulling towards her right shoulder.  She developed  transient difficulty lifting her neck up when using BOTOX A 200 units previously, reponding well to decreased dose of 150 units  She was enrolled into IPSEN dysport  study, first injection was in April 30th 2013, she later enrolled into open label, 2nd injection was in  May 28th 2013. She did very well.  Recent few weeks,  She had experienced flareup of her rheumatoid arthritis, has received IV infusion. Last injection was in 06/10/2012,  500 units of dysport  was dissolved into 2 cc of normal saline. Right longissimus capitus 0.5 cc Right splenius capitis 0.5 cc Right levator scapular 0.5 cc Right iliocostalis 0.5 cc   Dysport  research study has completed, she is now coming back for repeat injection, she noticed head bobbing over the past few weeks, she denies significant neck pain, weakness,  Her rheumatoid arthritis is under better control   Last injection was in Sep 2014, she did very well, no neck extension weakness, only rarely neck shaking  UPDATE March 8th 2016: She has lost follow-up since her last  visit December 2014, She came in for treatment her cervical dystonia, she feel the tension of her neck all the time, stiffness. Denied gait difficulty, last injection was September 2014, she received 500 units of Dysport , works well for her, she is continue on medication for anxiety, rheumatoid arthritis She also complains of nighttime snoring, frequent awakening, excessive daytime sleepiness, fatigue, today's ESS is 6, FSS was 78,  UPDATE October 13 2014:   She has missed her scheduled sleep study, continue have worsening neck posturing, posterior neck pain, excessive daytime fatigue and sleepiness  UPDATE Nov 11 2014: She came in for EMG guided Dysport  injection today, last injection was December 2014, she complains of worsening head titubation, bilateral hands mild postural tremor, posterior neck pain,  Update February 17 2015 She responded very well to last EMG guided Dysport  injection Nov 11 2014, no significant head titubation, no significant neck pain  Update July 22 2015: Last injection was August, now she noticed returning of neck pulling and shaking, posterior neck muscle achy pain  Update October 21 2015: Last EMG guided Dysport  injection was in January, she responded very well, only recent couple weeks, she noticed returning of neck pulling, shaking again, She has worsening rheumatoid arthritis, is receiving more frequent treatment, complains fatigue, diffuse body achy pain  Update March 15 2016: She responded very well to previous Dysport  injection in April, no significant side effect noticed, only recent few weeks, 5 months from previous injection, she noticed return of head shaking neck muscle tightness.  UPDATE Dec 14th 2017: She responded very  well to previous injection in Sept 2017, no significant side effect noticed.  Update September 13 2016: She did well this last injection in December  Update January 15 2017: She noticed worsening head tremor, she had excessive stress,  her sister passed away from lung cancer in April 2018,  UPDATE Apr 25 2017: She responded very well with previous injection, barely has noticeable head shaking.  Update August 01, 2017: She responded very well to previous dysport  injection, only recently began to have recurrent head shaking  UPDATE December 19 2017: She responded well to previous injection  UPDATE Jun 20 2018: She did very well with previous injection  UPDATE October 01 2018: She did well to previous injection  UPDATE January 01 2019: She did well with previous injection  UPDATE Oct 28th 2020: She did well to previous injections  UPDATE Jan 27th 2021: She did well to previous injections  Update October 29, 2019: She did well with previous injection, no significant side effect noted.  Update February 04, 2020, Uses Dysport  500 units, responding well  UPDATE Jun 02 2020: Uses Dysport  500 units, responding well  Update September 28, 2020: She responded well to previous injection, unfortunately she has been a longtime smoker, diagnosed with COPD, chest x-ray showed bibasilar atelectasis, and infiltration, small left pleural effusion on September 08, 2020  UPDATE December 01 2021: She did well with his previous injection,  UPDATE September 12 2021: She retired the end of October 2023, neck overall doing well well, only occasionally shaking, muscle tension  UPDATE January 01 2023: She responded well to previous injection  UPDATE Jun 20 2023: She responded well to previous injection no significant side effect noted,  UPDATE September 17 2023: Denied neck pain, but increased head titubation turning towards the right,  UPDATE February 13 2024: This is 5 months from previous injection, she did well, only mild neck muscle tension, titubation  PHYSICAL EXAM   Today's Vitals   02/13/24 1556  BP: 136/82    PHYSICAL EXAMNIATION:   Mild retrocollis, mild right tilt,\with mild left shoulder elevation  ASSESSMENT AND PLAN  Courtney Myers is a 63 y.o. female with long-standing history of cervical dystonia, responded very well to EMG guided Dysport  injection,  Mild retrocollis, right tilt, mild left shoulder elevation, occasionally no-no titubation  500 of Dysport  was dissolved into 2.5 cc of normal saline,  Right splenius capitus 0.25 ccx2=0.5 Right levator scapular 0.25 cc Right splenius cervix 0.25 ccx2=0.5 cc  Left splenius cervix 0.25 Left splenius capitis 0.5 Left levator scapular 0.5 cc   Patient tolerate the injection well, will return to clinic in 3 months for repeat injection     Modena Callander, M.D. Ph.D.  [facility] [address] [place], [place] [address] Ph: [phone] Fax: [phone]

## 2024-02-13 NOTE — Progress Notes (Signed)
 Dysport  500units x 1 vial  [id number] Exp-08/02/2025 B/B Bacteriostatic 0.9% Sodium Chloride - 2.5 mL  Lot: [id number] Expiration: 05/02/25 NDC: 9590803397 Dx: G24.3  WITNESSED AB:zffj jones rn

## 2024-02-15 ENCOUNTER — Ambulatory Visit: Admitting: Family Medicine

## 2024-02-15 VITALS — BP 124/72 | HR 82 | Temp 98.4°F | Ht 66.0 in | Wt 182.2 lb

## 2024-02-15 DIAGNOSIS — K5900 Constipation, unspecified: Secondary | ICD-10-CM | POA: Insufficient documentation

## 2024-02-15 DIAGNOSIS — R7989 Other specified abnormal findings of blood chemistry: Secondary | ICD-10-CM | POA: Insufficient documentation

## 2024-02-15 DIAGNOSIS — E7800 Pure hypercholesterolemia, unspecified: Secondary | ICD-10-CM

## 2024-02-15 DIAGNOSIS — Z780 Asymptomatic menopausal state: Secondary | ICD-10-CM | POA: Diagnosis not present

## 2024-02-15 DIAGNOSIS — Z1231 Encounter for screening mammogram for malignant neoplasm of breast: Secondary | ICD-10-CM | POA: Diagnosis not present

## 2024-02-15 DIAGNOSIS — K760 Fatty (change of) liver, not elsewhere classified: Secondary | ICD-10-CM | POA: Insufficient documentation

## 2024-02-15 DIAGNOSIS — Z87891 Personal history of nicotine dependence: Secondary | ICD-10-CM | POA: Diagnosis not present

## 2024-02-15 DIAGNOSIS — K219 Gastro-esophageal reflux disease without esophagitis: Secondary | ICD-10-CM | POA: Insufficient documentation

## 2024-02-15 DIAGNOSIS — K5790 Diverticulosis of intestine, part unspecified, without perforation or abscess without bleeding: Secondary | ICD-10-CM | POA: Insufficient documentation

## 2024-02-15 NOTE — Progress Notes (Signed)
 Subjective:    Patient ID: Courtney Myers, female    DOB: Sep 22, 1960, 63 y.o.   MRN: [id number]  Patient is here today for a checkup.  Unfortunately she has started smoking again.  She has a history of coronary artery calcification seen in her left anterior descending coronary artery.  I again explained to her that smoking will cause this to continue to progress.  She denies any angina or chest pain.  She is on rosuvastatin .  She is due to check a fasting lipid panel.  She denies any myalgias or right upper quadrant pain.  Her rheumatologist just checked a CBC and a CMP in June which were normal.  She is due for the RSV.  Her shingles vaccine and pneumonia vaccines are up-to-date.  Her colonoscopy is due again in 2030.  She is overdue for mammogram.  I discussed this with her and she will allow me to schedule it.  She is also overdue for a bone density test.  She had a lung cancer screening CT scan earlier this year that showed pulmonary nodules.  They recommended a repeat scan in 6 months to monitor.  This is scheduled and is upcoming in the next week  Past Medical History:  Diagnosis Date   Anxiety    Cervical dystonia    COPD (chronic obstructive pulmonary disease) (HCC) 10/2020   Depression    Fibromyalgia    GERD (gastroesophageal reflux disease)    History of shingles    RA (rheumatoid arthritis) (HCC)    Synovitis of hand    Vertigo    Past Surgical History:  Procedure Laterality Date   COLONOSCOPY  10/25/2009   TONSILLECTOMY     VAGINAL HYSTERECTOMY  09/01/2006   Current Outpatient Medications on File Prior to Visit  Medication Sig Dispense Refill   AbobotulinumtoxinA  (DYSPORT ) 500 units SOLR injection INJECT 500 UNITS  INTRAMUSCULARLY EVERY 3  MONTHS (GIVEN AT MD OFFICE, DISCARD UNUSED) 1 each 3   aspirin  EC 81 MG tablet Take 1 tablet (81 mg total) by mouth daily. Swallow whole. 90 tablet 3   clonazePAM  (KLONOPIN ) 0.5 MG tablet Take 1 tablet (0.5 mg total) by mouth 2  (two) times daily as needed. 60 tablet 2   DULoxetine  (CYMBALTA ) 60 MG capsule Take 1 capsule (60 mg total) by mouth daily. 30 capsule 0   Fluticasone-Umeclidin-Vilant (TRELEGY ELLIPTA ) 200-62.5-25 MCG/ACT AEPB Inhale 1 puff into the lungs daily. 180 each 0   metoprolol  succinate (TOPROL -XL) 25 MG 24 hr tablet Take 1 tablet (25 mg total) by mouth daily. 90 tablet 1   Multiple Vitamins-Minerals (MULTIVITAMINS THER. W/MINERALS) TABS Take 1 tablet by mouth at bedtime.      omeprazole  (PRILOSEC) 20 MG capsule Take 1 capsule (20 mg total) by mouth daily. 90 capsule 3   rosuvastatin  (CRESTOR ) 20 MG tablet Take 1 tablet (20 mg total) by mouth daily. 90 tablet 1   Upadacitinib  ER (RINVOQ ) 15 MG TB24 Take 1 tablet (15 mg total) by mouth daily. 90 tablet 0   Xylitol (XYLIMELTS MT) Use as directed 2 tablets in the mouth or throat at bedtime as needed (dry mouth).      Current Facility-Administered Medications on File Prior to Visit  Medication Dose Route Frequency Provider Last Rate Last Admin   0.9 %  sodium chloride  infusion  500 mL Intravenous Once Nandigam, Kavitha V, MD       No Known Allergies Social History   Socioeconomic History   Marital status: Married  Spouse name: [name]   Number of children: 2   Years of education: 12   Highest education level: 12th grade  Occupational History    Employer: [employer]  Tobacco Use   Smoking status: Former    Current packs/day: 0.00    Average packs/day: 0.3 packs/day for 43.4 years (10.8 ttl pk-yrs)    Types: Cigarettes    Start date: 80    Quit date: 11/16/2020    Years since quitting: 3.2    Passive exposure: Current   Smokeless tobacco: Never  Vaping Use   Vaping status: Never Used  Substance and Sexual Activity   Alcohol use: Yes    Alcohol/week: 0.0 standard drinks of alcohol    Comment: Rare, twice per year   Drug use: Never   Sexual activity: Yes    Birth control/protection: Surgical    Comment: 1st intercourse 63 yo-Fewer  than 5 partners  Other Topics Concern   Not on file  Social History Narrative   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 Right handed.    Caffeine- 4 cups daily.   Social Drivers of Corporate investment banker Strain: Low Risk  (02/15/2024)   Overall Financial Resource Strain (CARDIA)    Difficulty of Paying Living Expenses: Not hard at all  Food Insecurity: No Food Insecurity (02/15/2024)   Hunger Vital Sign    Worried About Running Out of Food in the Last Year: Never true    Ran Out of Food in the Last Year: Never true  Transportation Needs: No Transportation Needs (02/15/2024)   PRAPARE - Administrator, Civil Service (Medical): No    Lack of Transportation (Non-Medical): No  Physical Activity: Insufficiently Active (02/15/2024)   Exercise Vital Sign    Days of Exercise per Week: 5 days    Minutes of Exercise per Session: 20 min  Stress: Stress Concern Present (02/15/2024)   [employer] of Occupational Health - Occupational Stress Questionnaire    Feeling of Stress: Rather much  Social Connections: Moderately Isolated (02/15/2024)   Social Connection and Isolation Panel    Frequency of Communication with Friends and Family: Once a week    Frequency of Social Gatherings with Friends and Family: Once a week    Attends Religious Services: 1 to 4 times per year    Active Member of [employer] or Organizations: No    Attends Engineer, structural: Not on file    Marital Status: Married  Catering manager Violence: Not on file    The patient smokes.  She has a history of coronary artery disease.  Her father had numerous stents and bypasses starting in his 49s.  She had a paternal uncle who had stents in his 60s.  Her paternal grandfather also had stents and bypasses.  Therefore coronary artery disease is strong and the female members on her father side of the family usually in the late 46s or  early 37s.   Review of Systems  All other systems reviewed and are negative.      Objective:   Physical Exam Vitals reviewed.  Constitutional:      General: She is not in acute distress.    Appearance: Normal appearance. She is obese. She is not ill-appearing or toxic-appearing.  HENT:     Head: Normocephalic and atraumatic.     Right Ear: Tympanic membrane and ear canal normal.     Left Ear:  Tympanic membrane and ear canal normal.     Nose: Nose normal. No congestion or rhinorrhea.     Mouth/Throat:     Mouth: Mucous membranes are moist.     Pharynx: Oropharynx is clear. No oropharyngeal exudate or posterior oropharyngeal erythema.  Eyes:     Extraocular Movements: Extraocular movements intact.     Conjunctiva/sclera: Conjunctivae normal.     Pupils: Pupils are equal, round, and reactive to light.  Neck:     Vascular: No carotid bruit.  Cardiovascular:     Rate and Rhythm: Normal rate and regular rhythm.     Pulses: Normal pulses.     Heart sounds: Normal heart sounds. No murmur heard.    No friction rub. No gallop.  Pulmonary:     Effort: Pulmonary effort is normal. No respiratory distress.     Breath sounds: Normal breath sounds. No stridor. No wheezing, rhonchi or rales.  Chest:     Chest wall: No tenderness or crepitus.  Abdominal:     General: Abdomen is flat. Bowel sounds are normal. There is no distension.     Palpations: Abdomen is soft.     Tenderness: There is no abdominal tenderness. There is no guarding or rebound.     Hernia: No hernia is present.  Musculoskeletal:        General: No swelling, tenderness, deformity or signs of injury.     Cervical back: Normal range of motion and neck supple. No rigidity.     Right lower leg: No edema.     Left lower leg: No edema.  Lymphadenopathy:     Cervical: No cervical adenopathy.  Skin:    General: Skin is warm.     Coloration: Skin is not jaundiced.     Findings: No bruising, erythema or lesion.   Neurological:     General: No focal deficit present.     Mental Status: She is alert and oriented to person, place, and time. Mental status is at baseline.     Cranial Nerves: No cranial nerve deficit.     Sensory: No sensory deficit.     Motor: No weakness.     Coordination: Coordination normal.     Gait: Gait normal.     Deep Tendon Reflexes: Reflexes normal.  Psychiatric:        Mood and Affect: Mood normal.        Behavior: Behavior normal.        Thought Content: Thought content normal.        Judgment: Judgment normal.           Assessment & Plan:  Pure hypercholesterolemia - Plan: Lipid panel  Encounter for screening mammogram for malignant neoplasm of breast - Plan: MM Digital Screening  Postmenopausal estrogen deficiency - Plan: DG Bone Density  History of tobacco abuse Continue to encourage smoking cessation.  Await the results of the CT scan for follow-up of her pulmonary nodules.  I convinced the patient to allow me to schedule her for a mammogram as well as a bone density test.  I recommended that she get the RSV vaccine.  Her blood pressure is excellent.  I will check a fasting lipid panel because I want her LDL cholesterol to be less than 55.  Emphasized the importance of smoking cessation to prevent progression of coronary artery disease

## 2024-02-16 LAB — LIPID PANEL
Cholesterol: 176 mg/dL (ref <200)
HDL: 65 mg/dL (ref >=50)
LDL Cholesterol (Calc): 85 mg/dL
Non-HDL Cholesterol (Calc): 111 mg/dL (ref <130)
Total CHOL/HDL Ratio: 2.7 (calc) (ref <5.0)
Triglycerides: 164 mg/dL — ABNORMAL HIGH (ref <150)

## 2024-02-18 ENCOUNTER — Ambulatory Visit: Payer: Self-pay | Admitting: Family Medicine

## 2024-02-18 ENCOUNTER — Inpatient Hospital Stay
Admission: RE | Admit: 2024-02-18 | Discharge: 2024-02-18 | Disposition: A | Discharge status: Home / Self Care | Source: Ambulatory Visit | Attending: Acute Care | Admitting: Acute Care

## 2024-02-18 DIAGNOSIS — Z122 Encounter for screening for malignant neoplasm of respiratory organs: Secondary | ICD-10-CM

## 2024-02-18 DIAGNOSIS — Z87891 Personal history of nicotine dependence: Secondary | ICD-10-CM

## 2024-02-18 DIAGNOSIS — R911 Solitary pulmonary nodule: Secondary | ICD-10-CM

## 2024-02-19 ENCOUNTER — Other Ambulatory Visit: Payer: Self-pay

## 2024-02-19 MED ORDER — DULOXETINE HCL 60 MG PO CPEP: 60.0000 mg | ORAL_CAPSULE | Freq: Every day | ORAL | 0 refills | Status: DC

## 2024-02-19 NOTE — Progress Notes (Signed)
 Not able to make contact thru numbers on file due to number being out of contact . Sent my chart result letter.

## 2024-02-19 NOTE — Telephone Encounter (Signed)
 Refill request received via fax from [facility] for Cymbalta   Last Fill: 01/17/2024  Next Visit: 06/18/2024  Last Visit: 01/17/2024  Dx: Fibromyalgia   Current Dose per office note on 01/17/2024: Cymbalta  60 mg 1 capsule daily   Okay to refill Cymbalta ?

## 2024-03-04 ENCOUNTER — Other Ambulatory Visit: Payer: Self-pay

## 2024-03-04 DIAGNOSIS — Z122 Encounter for screening for malignant neoplasm of respiratory organs: Secondary | ICD-10-CM

## 2024-03-04 DIAGNOSIS — Z87891 Personal history of nicotine dependence: Secondary | ICD-10-CM

## 2024-03-14 ENCOUNTER — Ambulatory Visit
Admission: RE | Admit: 2024-03-14 | Discharge: 2024-03-14 | Disposition: A | Discharge status: Home / Self Care | Source: Ambulatory Visit | Attending: Family Medicine | Admitting: Family Medicine

## 2024-03-14 DIAGNOSIS — Z1231 Encounter for screening mammogram for malignant neoplasm of breast: Secondary | ICD-10-CM

## 2024-03-21 ENCOUNTER — Ambulatory Visit (INDEPENDENT_AMBULATORY_CARE_PROVIDER_SITE_OTHER): Admitting: Family Medicine

## 2024-03-21 ENCOUNTER — Encounter: Payer: Self-pay | Admitting: Family Medicine

## 2024-03-21 VITALS — BP 120/64 | HR 69 | Temp 97.7°F | Ht 66.0 in | Wt 194.4 lb

## 2024-03-21 DIAGNOSIS — Z0001 Encounter for general adult medical examination with abnormal findings: Secondary | ICD-10-CM

## 2024-03-21 DIAGNOSIS — Z23 Encounter for immunization: Secondary | ICD-10-CM

## 2024-03-21 DIAGNOSIS — Z87891 Personal history of nicotine dependence: Secondary | ICD-10-CM | POA: Diagnosis not present

## 2024-03-21 DIAGNOSIS — Z0000 Encounter for general adult medical examination without abnormal findings: Secondary | ICD-10-CM

## 2024-03-21 DIAGNOSIS — I2510 Atherosclerotic heart disease of native coronary artery without angina pectoris: Secondary | ICD-10-CM

## 2024-03-21 DIAGNOSIS — I2584 Coronary atherosclerosis due to calcified coronary lesion: Secondary | ICD-10-CM

## 2024-03-21 DIAGNOSIS — E7800 Pure hypercholesterolemia, unspecified: Secondary | ICD-10-CM | POA: Diagnosis not present

## 2024-03-21 DIAGNOSIS — F321 Major depressive disorder, single episode, moderate: Secondary | ICD-10-CM

## 2024-03-21 MED ORDER — BUPROPION HCL ER (XL) 150 MG PO TB24: 150.0000 mg | ORAL_TABLET | Freq: Every day | ORAL | 3 refills | Status: DC

## 2024-03-21 NOTE — Progress Notes (Signed)
 Subjective:    Patient ID: Courtney Myers, female    DOB: Nov 10, 1960, 63 y.o.   MRN: [id number]  Patient is here today for CPE.  She has a history of coronary artery calcification seen in her left anterior descending coronary artery.    She is on rosuvastatin .  She is due to check a fasting lipid panel.  She is due for the RSV.  Her shingles vaccine and pneumonia vaccines are up-to-date.  Her colonoscopy is due again in 2030.  Mammogram was clear 9/25.  She had a lung cancer screening CT scan 8/25:   IMPRESSION: 1. Lung-RADS 2, benign appearance or behavior. Continue annual screening with low-dose chest CT without contrast in 12 months. 2. One vessel coronary atherosclerosis. 3. Small hiatal hernia. 4. Aortic Atherosclerosis (ICD10-I70.0) and Emphysema (ICD10-J43.9).  Patient is due for her flu shot.  She would like to get this today.  She is also due for RSV.  She has scheduled this for next week.  We discussed the COVID shot and she has elected not to get this.  Her bone density has not yet been scheduled.  The patient has the contact information and plans to schedule this herself.  Her most recent LDL cholesterol was 85.  Due to the lesion seen in the LAD, we are trying to keep her LDL cholesterol less than 55.  She recently increased her medication to 40 mg a day.  She does report worsening depression.  She has been on Cymbalta  for fibromyalgia and pain for many years.  She states that the medication has helped her dramatically.  However her mother passed away 06-24-25and since that time, the patient has developed anhedonia, lack of energy, trouble sleeping, lack of desire, lack of motivation, and a general feeling of sadness and emptiness that is not improving Past Medical History:  Diagnosis Date   Anxiety    Cervical dystonia    COPD (chronic obstructive pulmonary disease) (HCC) 10/2020   Depression    Fibromyalgia    GERD (gastroesophageal reflux disease)    History of shingles     RA (rheumatoid arthritis) (HCC)    Synovitis of hand    Vertigo    Past Surgical History:  Procedure Laterality Date   COLONOSCOPY  10/25/2009   TONSILLECTOMY     VAGINAL HYSTERECTOMY  09/01/2006   Current Outpatient Medications on File Prior to Visit  Medication Sig Dispense Refill   AbobotulinumtoxinA  (DYSPORT ) 500 units SOLR injection INJECT 500 UNITS  INTRAMUSCULARLY EVERY 3  MONTHS (GIVEN AT MD OFFICE, DISCARD UNUSED) 1 each 3   aspirin  EC 81 MG tablet Take 1 tablet (81 mg total) by mouth daily. Swallow whole. 90 tablet 3   clonazePAM  (KLONOPIN ) 0.5 MG tablet Take 1 tablet (0.5 mg total) by mouth 2 (two) times daily as needed. 60 tablet 2   DULoxetine  (CYMBALTA ) 60 MG capsule Take 1 capsule (60 mg total) by mouth daily. 90 capsule 0   Fluticasone-Umeclidin-Vilant (TRELEGY ELLIPTA ) 200-62.5-25 MCG/ACT AEPB Inhale 1 puff into the lungs daily. 180 each 0   metoprolol  succinate (TOPROL -XL) 25 MG 24 hr tablet Take 1 tablet (25 mg total) by mouth daily. 90 tablet 1   Multiple Vitamins-Minerals (MULTIVITAMINS THER. W/MINERALS) TABS Take 1 tablet by mouth at bedtime.      omeprazole  (PRILOSEC) 20 MG capsule Take 1 capsule (20 mg total) by mouth daily. 90 capsule 3   rosuvastatin  (CRESTOR ) 20 MG tablet Take 1 tablet (20 mg total) by mouth  daily. 90 tablet 1   Upadacitinib  ER (RINVOQ ) 15 MG TB24 Take 1 tablet (15 mg total) by mouth daily. 90 tablet 0   Xylitol (XYLIMELTS MT) Use as directed 2 tablets in the mouth or throat at bedtime as needed (dry mouth).      Current Facility-Administered Medications on File Prior to Visit  Medication Dose Route Frequency Provider Last Rate Last Admin   0.9 %  sodium chloride  infusion  500 mL Intravenous Once Nandigam, Kavitha V, MD       No Known Allergies Social History   Socioeconomic History   Marital status: Married    Spouse name: [name]   Number of children: 2   Years of education: 12   Highest education level: 12th grade  Occupational  History    Employer: [employer]  Tobacco Use   Smoking status: Former    Current packs/day: 0.00    Average packs/day: 0.3 packs/day for 43.4 years (10.8 ttl pk-yrs)    Types: Cigarettes    Start date: 6    Quit date: 11/16/2020    Years since quitting: 3.3    Passive exposure: Current   Smokeless tobacco: Never  Vaping Use   Vaping status: Never Used  Substance and Sexual Activity   Alcohol use: Yes    Alcohol/week: 0.0 standard drinks of alcohol    Comment: Rare, twice per year   Drug use: Never   Sexual activity: Yes    Birth control/protection: Surgical    Comment: 1st intercourse 63 yo-Fewer than 5 partners  Other Topics Concern   Not on file  Social History Narrative   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 Right handed.    Caffeine- 4 cups daily.   Social Drivers of Corporate investment banker Strain: Low Risk  (02/15/2024)   Overall Financial Resource Strain (CARDIA)    Difficulty of Paying Living Expenses: Not hard at all  Food Insecurity: No Food Insecurity (02/15/2024)   Hunger Vital Sign    Worried About Running Out of Food in the Last Year: Never true    Ran Out of Food in the Last Year: Never true  Transportation Needs: No Transportation Needs (02/15/2024)   PRAPARE - Administrator, Civil Service (Medical): No    Lack of Transportation (Non-Medical): No  Physical Activity: Insufficiently Active (02/15/2024)   Exercise Vital Sign    Days of Exercise per Week: 5 days    Minutes of Exercise per Session: 20 min  Stress: Stress Concern Present (02/15/2024)   [employer] of Occupational Health - Occupational Stress Questionnaire    Feeling of Stress: Rather much  Social Connections: Moderately Isolated (02/15/2024)   Social Connection and Isolation Panel    Frequency of Communication with Friends and Family: Once a week    Frequency of Social Gatherings with  Friends and Family: Once a week    Attends Religious Services: 1 to 4 times per year    Active Member of [employer] or Organizations: No    Attends Engineer, structural: Not on file    Marital Status: Married  Catering manager Violence: Not on file    The patient smokes.  She has a history of coronary artery disease.  Her father had numerous stents and bypasses starting in his 18s.  She had a paternal uncle who had stents in his 67s.  Her paternal grandfather also had stents and bypasses.  Therefore coronary artery disease is strong and the female members on her father side of the family usually in the late 50s or early 70s.   Review of Systems  All other systems reviewed and are negative.      Objective:   Physical Exam Vitals reviewed.  Constitutional:      General: She is not in acute distress.    Appearance: Normal appearance. She is obese. She is not ill-appearing or toxic-appearing.  HENT:     Head: Normocephalic and atraumatic.     Right Ear: Tympanic membrane and ear canal normal.     Left Ear: Tympanic membrane and ear canal normal.     Nose: Nose normal. No congestion or rhinorrhea.     Mouth/Throat:     Mouth: Mucous membranes are moist.     Pharynx: Oropharynx is clear. No oropharyngeal exudate or posterior oropharyngeal erythema.  Eyes:     Extraocular Movements: Extraocular movements intact.     Conjunctiva/sclera: Conjunctivae normal.     Pupils: Pupils are equal, round, and reactive to light.  Neck:     Vascular: No carotid bruit.  Cardiovascular:     Rate and Rhythm: Normal rate and regular rhythm.     Pulses: Normal pulses.     Heart sounds: Normal heart sounds. No murmur heard.    No friction rub. No gallop.  Pulmonary:     Effort: Pulmonary effort is normal. No respiratory distress.     Breath sounds: Normal breath sounds. No stridor. No wheezing, rhonchi or rales.  Chest:     Chest wall: No tenderness or crepitus.  Abdominal:     General: Abdomen  is flat. Bowel sounds are normal. There is no distension.     Palpations: Abdomen is soft.     Tenderness: There is no abdominal tenderness. There is no guarding or rebound.     Hernia: No hernia is present.  Musculoskeletal:        General: No swelling, tenderness, deformity or signs of injury.     Cervical back: Normal range of motion and neck supple. No rigidity.     Right lower leg: No edema.     Left lower leg: No edema.  Lymphadenopathy:     Cervical: No cervical adenopathy.  Skin:    General: Skin is warm.     Coloration: Skin is not jaundiced.     Findings: No bruising, erythema or lesion.  Neurological:     General: No focal deficit present.     Mental Status: She is alert and oriented to person, place, and time. Mental status is at baseline.     Cranial Nerves: No cranial nerve deficit.     Sensory: No sensory deficit.     Motor: No weakness.     Coordination: Coordination normal.     Gait: Gait normal.     Deep Tendon Reflexes: Reflexes normal.  Psychiatric:        Mood and Affect: Mood normal.        Behavior: Behavior normal.        Thought Content: Thought content normal.        Judgment: Judgment normal.           Assessment & Plan:    General medical exam  Pure hypercholesterolemia  History of tobacco abuse  Coronary artery disease due to calcified coronary lesion  Depression, major, single episode, moderate (HCC) Recommended we recheck cholesterol in 3 months on the higher dose of statin.  Patient has quit smoking recently on Chantix  which I am very proud of her for doing.  Mammogram and lung cancer screening are now up-to-date.  Patient will schedule her bone density test.  Received her flu shot today and receiving her RSV shot next week.  Start Wellbutrin  XL 150 mg daily and recheck in 4 weeks

## 2024-03-21 NOTE — Addendum Note (Signed)
 Addended by: ANGELENA RONAL BRADLEY K on: 03/21/2024 02:27 PM   Modules accepted: Orders

## 2024-04-03 ENCOUNTER — Other Ambulatory Visit: Payer: Self-pay

## 2024-04-03 DIAGNOSIS — Z111 Encounter for screening for respiratory tuberculosis: Secondary | ICD-10-CM

## 2024-04-03 DIAGNOSIS — Z79899 Other long term (current) drug therapy: Secondary | ICD-10-CM

## 2024-04-04 ENCOUNTER — Encounter: Payer: Self-pay | Admitting: Pulmonary Disease

## 2024-04-04 ENCOUNTER — Ambulatory Visit (INDEPENDENT_AMBULATORY_CARE_PROVIDER_SITE_OTHER): Admitting: Pulmonary Disease

## 2024-04-04 ENCOUNTER — Ambulatory Visit: Payer: Self-pay | Admitting: Rheumatology

## 2024-04-04 VITALS — BP 114/76 | HR 69 | Temp 97.9°F | Ht 66.0 in | Wt 199.0 lb

## 2024-04-04 DIAGNOSIS — J4489 Other specified chronic obstructive pulmonary disease: Secondary | ICD-10-CM

## 2024-04-04 DIAGNOSIS — J439 Emphysema, unspecified: Secondary | ICD-10-CM

## 2024-04-04 DIAGNOSIS — R911 Solitary pulmonary nodule: Secondary | ICD-10-CM | POA: Diagnosis not present

## 2024-04-04 NOTE — Progress Notes (Signed)
 Courtney Myers    [id number]    09-Aug-1960  Primary Care Physician:Pickard, Butler DASEN, MD  Referring Physician: [name], MD [address] [place],  [place] [address]  Chief complaint: Follow-up for COPD, chest pain  HPI: 63 y.o.  active smoker fibromyalgia, GERD, rheumatoid arthritis She is followed for seronegative arthritis by Dr. Dolphus and is well controlled on Rinvoq  and Arava .  Complains of recurrent reproducible chest pain since November 2021.  She is seen in the ER in November 2021 and March 2022 for similar complaints and this was thought to be secondary to costochondritis She had a CT angiogram in December 2022 which did not show any PE.  There are some subtle changes suggestive of interstitial lung disease.  She has been recently started on prednisone  with improvement in symptoms.  However she is having insomnia and mood changes from the steroids  Reports intermittent episodes of sternal and back pain, chest tightness.  Denies any cough, sputum production She has been on and off chronic prednisone  for fibromyalgia.   Interim history: Discussed the use of AI scribe software for clinical note transcription with the patient, who gave verbal consent to proceed.  History of Present Illness Courtney Myers is a 63 year old female with COPD who presents for follow-up after quitting smoking.  Dyspnea and pulmonary function - Chronic obstructive pulmonary disease (COPD) with significant improvement in breathing since cessation of smoking in November 2022 - Currently maintained on Trelegy, which is effective for symptom control - Increased physical activity, including daily dog walking - No current tobacco use  Pulmonary nodules - Lung scans earlier in 2025 demonstrated lung nodules - Nodules have since resolved  Voice changes - Experiences a 'shaky' voice, particularly noticeable during phone conversations - Uncertain if symptoms  are related to pulmonary condition or cervical dystonia    Relevant pulmonary history Pets: 2 dogs Occupation: Environmental health practitioner Exposures: No mold, hot tub, Jacuzzi.  No feather pillows or comforters Smoking history: 40-pack-year smoker.  Quit in Nov 2022 Travel history: No significant travel history Relevant family history: No family history of lung disease  Outpatient Encounter Medications as of 04/04/2024  Medication Sig   AbobotulinumtoxinA  (DYSPORT ) 500 units SOLR injection INJECT 500 UNITS  INTRAMUSCULARLY EVERY 3  MONTHS (GIVEN AT MD OFFICE, DISCARD UNUSED)   aspirin  EC 81 MG tablet Take 1 tablet (81 mg total) by mouth daily. Swallow whole.   buPROPion  (WELLBUTRIN  XL) 150 MG 24 hr tablet Take 1 tablet (150 mg total) by mouth daily.   clonazePAM  (KLONOPIN ) 0.5 MG tablet Take 1 tablet (0.5 mg total) by mouth 2 (two) times daily as needed.   DULoxetine  (CYMBALTA ) 60 MG capsule Take 1 capsule (60 mg total) by mouth daily.   Fluticasone-Umeclidin-Vilant (TRELEGY ELLIPTA ) 200-62.5-25 MCG/ACT AEPB Inhale 1 puff into the lungs daily.   metoprolol  succinate (TOPROL -XL) 25 MG 24 hr tablet Take 1 tablet (25 mg total) by mouth daily.   Multiple Vitamins-Minerals (MULTIVITAMINS THER. W/MINERALS) TABS Take 1 tablet by mouth at bedtime.    omeprazole  (PRILOSEC) 20 MG capsule Take 1 capsule (20 mg total) by mouth daily.   rosuvastatin  (CRESTOR ) 20 MG tablet Take 1 tablet (20 mg total) by mouth daily.   Upadacitinib  ER (RINVOQ ) 15 MG TB24 Take 1 tablet (15 mg total) by mouth daily.   Xylitol (XYLIMELTS MT) Use as directed 2 tablets in the mouth or throat at bedtime as needed (dry  mouth).    Facility-Administered Encounter Medications as of 04/04/2024  Medication   0.9 %  sodium chloride  infusion   Vitals:   04/04/24 1043  BP: 114/76  Pulse: 69  Temp: 97.9 F (36.6 C)  Height: 5' 6 (1.676 m)  Weight: 199 lb (90.3 kg)  SpO2: 99%  TempSrc: Oral  BMI (Calculated): 32.13    Physical  Exam GEN: No acute distress CV: Regular rate and rhythm no murmurs LUNGS: Clear to auscultation bilaterally normal respiratory effort SKIN JOINTS: Warm and dry no rash   Data Reviewed: Imaging: CTA 06/16/2020-no pulmonary embolism, patchy peripheral opacities consistent with chronic interstitial lung disease, stable right lung nodules, emphysema.   High-res CT 09/29/2020- mild centrilobular nodularity in the upper lobes, 1 vessel coronary atherosclerosis, thyroid nodule, emphysema.  Screening CT chest 06/28/2022- stable pulmonary nodules, mild emphysema, coronary atherosclerosis  Screening CT chest 07/05/2023-scattered new pulmonary nodules measuring up to 5.7 mm, emphysema, coronary atherosclerosis  Screening CT chest 02/18/2024-previously described new lung nodules have resolved.  Emphysema, coronary atherosclerosis.  Stable mild patchy peripheral opacities. I have reviewed the images personally  PFTs: 11/16/2020 FVC 2.58 [73%], FEV1 1.61 [59%], F/F 63, TLC 4.51 [84%], DLCO 15.23 [90%] Moderate to severe obstruction, mild diffusion defect  Labs: CBC 08/02/2020-WBC 9.8, eos 0.4%, absolute eosinophil count 39 CBC 11/02/2020-WBC 11.5, eos 1.8%, absolute eosinophil count 207  Assessment & Plan Chronic obstructive pulmonary disease (COPD) Lung nodules COPD is well-managed with improved breathing since smoking cessation in November 2022. Lung nodules have resolved, likely due to inflammatory changes from chronic bronchitis or COPD. Mild bronchitic changes and ground glass in the upper lungs persist but are unchanged for three years. Trelegy is effective in managing symptoms. - Continue Trelegy for COPD management - Continue annual lung scans to monitor for changes  Shaky voice (dysphonia) Dysphonia is bothersome, especially on the phone. It is unclear if it is related to pulmonary issues or neurology. The inhaler could potentially irritate the airways, but this is unlikely as the symptom is  not new. Cervical dystonia treated with Dysport  injections may be related. Rheumatoid arthritis could also affect the vocal cords, though this is rare. - Consult neurologist regarding dysphonia - Consider ENT referral if neurologist deems it necessary  Rheumatoid arthritis Rheumatoid arthritis is managed with Arava  and Rinvoq . There is no evidence of interstitial lung disease on annual scans. Rheumatoid arthritis can cause vocal cord issues, but this is rare. - Continue Arava  and Rinvoq .  She follows with Dr. Dolphus - Monitor for any respiratory complications related to rheumatoid arthritis   Plan/Recommendations: Continue Trelegy, annual CT scans  Follow-up in 1 year  Lonna Coder MD [facility] 04/04/2024, 10:54 AM  CC: [name], MD

## 2024-04-04 NOTE — Patient Instructions (Signed)
  VISIT SUMMARY: You had a follow-up visit to discuss your COPD management and other health concerns. Your breathing has improved since you quit smoking, and your lung nodules have resolved. We also discussed your shaky voice and rheumatoid arthritis management.  YOUR PLAN: CHRONIC OBSTRUCTIVE PULMONARY DISEASE (COPD): Your COPD is well-managed, and your breathing has improved since you quit smoking. Your lung nodules have resolved. -Continue using Trelegy for COPD management. -Continue with annual lung scans to monitor for any changes.  SHAKY VOICE (DYSPHONIA): You have been experiencing a shaky voice, especially noticeable during phone conversations. It is unclear if this is related to your pulmonary condition or cervical dystonia. -Consult with a neurologist regarding your shaky voice. -Consider seeing an ENT specialist if the neurologist recommends it.  RHEUMATOID ARTHRITIS: Your rheumatoid arthritis is being managed with Arava  and Rinvoq . There is no evidence of interstitial lung disease on your annual scans. -Continue taking Arava  and Rinvoq  as prescribed. -Monitor for any respiratory complications related to rheumatoid arthritis.

## 2024-04-04 NOTE — Progress Notes (Signed)
 CBC is normal, creatinine is elevated at 1.31.  GFR is low at 45.  Patient should avoid all NSAIDs.  She does see her PCP for the evaluation of high creatinine and low GFR.  Please forward results to PCP.

## 2024-04-06 LAB — CBC WITH DIFFERENTIAL/PLATELET
Absolute Lymphocytes: 2022 {cells}/uL (ref 850–3900)
Absolute Monocytes: 703 {cells}/uL (ref 200–950)
Basophils Absolute: 79 {cells}/uL (ref 0–200)
Basophils Relative: 1 %
Eosinophils Absolute: 71 {cells}/uL (ref 15–500)
Eosinophils Relative: 0.9 %
HCT: 38.3 % (ref 35.0–45.0)
Hemoglobin: 12.7 g/dL (ref 11.7–15.5)
MCH: 30.6 pg (ref 27.0–33.0)
MCHC: 33.2 g/dL (ref 32.0–36.0)
MCV: 92.3 fL (ref 80.0–100.0)
MPV: 10.4 fL (ref 7.5–12.5)
Monocytes Relative: 8.9 %
Neutro Abs: 5024 {cells}/uL (ref 1500–7800)
Neutrophils Relative %: 63.6 %
Platelets: 274 Thousand/uL (ref 140–400)
RBC: 4.15 Million/uL (ref 3.80–5.10)
RDW: 12.3 % (ref 11.0–15.0)
Total Lymphocyte: 25.6 %
WBC: 7.9 Thousand/uL (ref 3.8–10.8)

## 2024-04-06 LAB — QUANTIFERON-TB GOLD PLUS
Mitogen-NIL: >10 [IU]/mL
NIL: 0.02 [IU]/mL
QuantiFERON-TB Gold Plus: NEGATIVE
TB1-NIL: 0 [IU]/mL
TB2-NIL: 0 [IU]/mL

## 2024-04-06 LAB — COMPREHENSIVE METABOLIC PANEL WITH GFR
AG Ratio: 1.8 (calc) (ref 1.0–2.5)
ALT: 21 U/L (ref 6–29)
AST: 23 U/L (ref 10–35)
Albumin: 4.4 g/dL (ref 3.6–5.1)
Alkaline phosphatase (APISO): 73 U/L (ref 37–153)
BUN/Creatinine Ratio: 18 (calc) (ref 6–22)
BUN: 23 mg/dL (ref 7–25)
CO2: 27 mmol/L (ref 20–32)
Calcium: 9.6 mg/dL (ref 8.6–10.4)
Chloride: 102 mmol/L (ref 98–110)
Creat: 1.31 mg/dL — ABNORMAL HIGH (ref 0.50–1.05)
Globulin: 2.5 g/dL (ref 1.9–3.7)
Glucose, Bld: 88 mg/dL (ref 65–139)
Potassium: 4.3 mmol/L (ref 3.5–5.3)
Sodium: 137 mmol/L (ref 135–146)
Total Bilirubin: 0.3 mg/dL (ref 0.2–1.2)
Total Protein: 6.9 g/dL (ref 6.1–8.1)
eGFR: 45 mL/min/1.73m2 — ABNORMAL LOW (ref >=60)

## 2024-04-06 NOTE — Progress Notes (Signed)
TB Gold negative

## 2024-04-07 ENCOUNTER — Other Ambulatory Visit: Payer: Self-pay | Admitting: Physician Assistant

## 2024-04-08 ENCOUNTER — Encounter: Admitting: Family Medicine

## 2024-04-14 ENCOUNTER — Encounter (HOSPITAL_BASED_OUTPATIENT_CLINIC_OR_DEPARTMENT_OTHER): Payer: Self-pay

## 2024-04-14 ENCOUNTER — Encounter (HOSPITAL_BASED_OUTPATIENT_CLINIC_OR_DEPARTMENT_OTHER): Payer: Self-pay | Admitting: Family

## 2024-04-14 ENCOUNTER — Ambulatory Visit (INDEPENDENT_AMBULATORY_CARE_PROVIDER_SITE_OTHER): Admitting: Family

## 2024-04-14 VITALS — BP 118/62 | HR 64 | Ht 66.0 in | Wt 201.0 lb

## 2024-04-14 DIAGNOSIS — I2510 Atherosclerotic heart disease of native coronary artery without angina pectoris: Secondary | ICD-10-CM | POA: Diagnosis not present

## 2024-04-14 DIAGNOSIS — I700 Atherosclerosis of aorta: Secondary | ICD-10-CM | POA: Diagnosis not present

## 2024-04-14 DIAGNOSIS — E785 Hyperlipidemia, unspecified: Secondary | ICD-10-CM

## 2024-04-14 MED ORDER — ROSUVASTATIN CALCIUM 40 MG PO TABS: 40.0000 mg | ORAL_TABLET | Freq: Every day | ORAL | 3 refills | Status: DC

## 2024-04-14 NOTE — Progress Notes (Signed)
 Cardiology Office Note   Date:  04/14/2024  ID:  Felise, [place] 07/18/1960, MRN [id number] PCP: [name], MD  [facility] Providers Cardiologist:  [name], MD     History of Present Illness Courtney Myers is a 63 y.o. female with history of rheumatoid arthritis, nonobstructive coronary artery disease, hyperlipidemia, LBBB, HFrEF, aortic atherosclerosis, PVC  Echo 08/03/2023 LVEF 35-40%, LV mildly dilated, grade 1 diastolic dysfunction, RV normal, mild MR.  Cardiac CTA 09/2023 calcium  score of 144 placing her in the 87th percentile for age/sex/rate 20 controls with total plaque volume 99 mm acute placing her in the 51st percentile.  Overall minimal nonobstructive CAD in the LAD and LCx.  Presents today for follow-up with her husband. No formal exercise routine though endorses interest in increasing physical activity. Eats at home and eats out.  Significant other notes after long periods of talking her voice gets shaky and tremory. Reports feeling like the air not coming out ongoing for 6-12 months. It is sometimes worse than other  - does not appear to be seasonal.  Pulmonology has recommended follow-up with neurology for possible evaluation of dysphonia.  No chest pain, palpitations, edema.  ROS: Please see the history of present illness.    All other systems reviewed and are negative.   Studies Reviewed      Cardiac Studies & Procedures   ______________________________________________________________________________________________     ECHOCARDIOGRAM  ECHOCARDIOGRAM COMPLETE 08/03/2023  Narrative ECHOCARDIOGRAM REPORT    Patient Name:   Courtney Myers Date of Exam: 08/03/2023 Medical Rec #:  [id number]              Height:       66.0 in Accession #:    [id number]             Weight:       183.0 lb Date of Birth:  09/22/1960              BSA:          1.926 m Patient Age:    62 years               BP:           102/64  mmHg Patient Gender: F                      HR:           53 bpm. Exam Location:  Outpatient  Procedure: 2D Echo, 3D Echo, Cardiac Doppler, Color Doppler and Intracardiac Opacification Agent  Indications:    R06.9 DOE; I42.9 Cardiomyopathy (unspecified)  History:        Patient has prior history of Echocardiogram examinations, most recent 12/19/2021. Cardiomyopathy, COPD, Arrythmias:PVC, Signs/Symptoms:Dyspnea; Risk Factors:Former Smoker. Patient denies chest pain and leg edema. She does have DOE.  Sonographer:    Annabella Cater RVT, RDCS (AE), RDMS Referring Phys: [id number] BRIDGETTE CHRISTOPHER  IMPRESSIONS   1. Left ventricular ejection fraction, by estimation, is 35 to 40%. The left ventricle has moderately decreased function. The left ventricle demonstrates regional wall motion abnormalities (see scoring diagram/findings for description). The left ventricular internal cavity size was moderately dilated. Left ventricular diastolic parameters are consistent with Grade I diastolic dysfunction (impaired relaxation). There is global hypokinesis and severe hypokinesis of the left ventricular, mid anteroseptal wall and inferoseptal wall. There is severe hypokinesis of the left ventricular, apical anterior wall and septal wall. There is severe hypokinesis of the left ventricular,  apical segment. There is severe hypokinesis of the left ventricular, entire inferior wall. 2. Right ventricular systolic function is normal. The right ventricular size is normal. 3. Left atrial size was moderately dilated. 4. The mitral valve is normal in structure. Mild mitral valve regurgitation. No evidence of mitral stenosis. 5. The aortic valve is tricuspid. Aortic valve regurgitation is not visualized. Aortic valve sclerosis/calcification is present, without any evidence of aortic stenosis. Aortic valve area, by VTI measures 3.54 cm. Aortic valve mean gradient measures 4.0 mmHg. Aortic valve Vmax measures 1.45  m/s. 6. The inferior vena cava is normal in size with greater than 50% respiratory variability, suggesting right atrial pressure of 3 mmHg.  Comparison(s): EF 40%, global hypokinesis, mild LVH, prominent apical anomalous cord.  FINDINGS Left Ventricle: Left ventricular ejection fraction, by estimation, is 35 to 40%. The left ventricle has moderately decreased function. The left ventricle demonstrates regional wall motion abnormalities. Severe hypokinesis of the left ventricular, mid anteroseptal wall and inferoseptal wall. Severe hypokinesis of the left ventricular, apical anterior wall and septal wall. Severe hypokinesis of the left ventricular, entire inferior wall. Definity  contrast agent was given IV to delineate the left ventricular endocardial borders. 3D ejection fraction reviewed and evaluated as part of the interpretation. Alternate measurement of EF is felt to be most reflective of LV function. The left ventricular internal cavity size was moderately dilated. There is no left ventricular hypertrophy. Abnormal (paradoxical) septal motion, consistent with left bundle branch block. Left ventricular diastolic parameters are consistent with Grade I diastolic dysfunction (impaired relaxation). Normal left ventricular filling pressure.  Right Ventricle: The right ventricular size is normal. No increase in right ventricular wall thickness. Right ventricular systolic function is normal.  Left Atrium: Left atrial size was moderately dilated.  Right Atrium: Right atrial size was normal in size.  Pericardium: There is no evidence of pericardial effusion.  Mitral Valve: The mitral valve is normal in structure. Mild mitral valve regurgitation. No evidence of mitral valve stenosis.  Tricuspid Valve: The tricuspid valve is normal in structure. Tricuspid valve regurgitation is trivial. No evidence of tricuspid stenosis.  Aortic Valve: The aortic valve is tricuspid. Aortic valve regurgitation is not  visualized. Aortic valve sclerosis/calcification is present, without any evidence of aortic stenosis. Aortic valve mean gradient measures 4.0 mmHg. Aortic valve peak gradient measures 8.4 mmHg. Aortic valve area, by VTI measures 3.54 cm.  Pulmonic Valve: The pulmonic valve was normal in structure. Pulmonic valve regurgitation is mild. No evidence of pulmonic stenosis.  Aorta: The aortic root is normal in size and structure.  Venous: The inferior vena cava is normal in size with greater than 50% respiratory variability, suggesting right atrial pressure of 3 mmHg.  IAS/Shunts: No atrial level shunt detected by color flow Doppler.   LEFT VENTRICLE PLAX 2D LVIDd:         5.96 cm   Diastology LVIDs:         4.74 cm   LV e' medial:    6.15 cm/s LV PW:         0.79 cm   LV E/e' medial:  12.0 LV IVS:        0.93 cm   LV e' lateral:   6.83 cm/s LVOT diam:     2.50 cm   LV E/e' lateral: 10.8 LV SV:         96 LV SV Index:   50 LVOT Area:     4.91 cm  3D Volume EF: 3D EF:  45 % LV EDV:       196 ml LV ESV:       107 ml LV SV:        89 ml  RIGHT VENTRICLE RV S prime:     15.80 cm/s TAPSE (M-mode): 2.9 cm  LEFT ATRIUM             Index        RIGHT ATRIUM           Index LA diam:        3.90 cm 2.03 cm/m   RA Area:     16.20 cm LA Vol (A2C):   69.3 ml 35.99 ml/m  RA Volume:   44.90 ml  23.32 ml/m LA Vol (A4C):   91.1 ml 47.31 ml/m LA Biplane Vol: 83.0 ml 43.10 ml/m AORTIC VALVE                    PULMONIC VALVE AV Area (Vmax):    2.88 cm     PV Vmax:          1.06 m/s AV Area (Vmean):   3.08 cm     PV Peak grad:     4.5 mmHg AV Area (VTI):     3.54 cm     PR End Diast Vel: 3.90 msec AV Vmax:           145.00 cm/s AV Vmean:          87.200 cm/s AV VTI:            0.272 m AV Peak Grad:      8.4 mmHg AV Mean Grad:      4.0 mmHg LVOT Vmax:         85.10 cm/s LVOT Vmean:        54.700 cm/s LVOT VTI:          0.196 m LVOT/AV VTI ratio: 0.72  AORTA Ao Root diam:  3.10 cm Ao Asc diam:  3.40 cm Ao Arch diam: 3.2 cm  MITRAL VALVE               TRICUSPID VALVE MV Area (PHT): 3.06 cm    TR Peak grad:   28.1 mmHg MV Decel Time: 248 msec    TR Vmax:        265.00 cm/s MR Peak grad: 70.9 mmHg MR Vmax:      421.00 cm/s  SHUNTS MV E velocity: 73.90 cm/s  Systemic VTI:  0.20 m MV A velocity: 94.80 cm/s  Systemic Diam: 2.50 cm MV E/A ratio:  0.78  Wilbert Bihari MD Electronically signed by Wilbert Bihari MD Signature Date/Time: 08/03/2023/4:26:44 PM    Final    MONITORS  LONG TERM MONITOR (3-14 DAYS) 08/31/2021  Narrative Patch Wear Time:  10 days and 17 hours (2023-02-11T13:45:18-0500 to 2023-02-22T07:11:01-499)  Patient had a min HR of 45 bpm, max HR of 143 bpm, and avg HR of 86 bpm. Predominant underlying rhythm was Sinus Rhythm. 2 Supraventricular Tachycardia runs occurred, the run with the fastest interval lasting 9 beats with a max rate of 143 bpm, the longest lasting 10 beats with an avg rate of 115 bpm. No VT, atrial fibrillation, high degree block, or pauses noted. Isolated atrial ectopy was rare (<1%), and isolated ventricular ectopy was frequent (15.6%). There was 1 triggered event, which was sinus with PVCs.   CT SCANS  CT CORONARY MORPH W/CTA COR W/SCORE 09/24/2023  Addendum 10/05/2023 11:35 PM ADDENDUM REPORT:  10/05/2023 23:33  EXAM: OVER-READ INTERPRETATION  CT CHEST  The following report is an over-read performed by radiologist Dr. Suzen Dials of [facility] on 10/05/2023. This over-read does not include interpretation of cardiac or coronary anatomy or pathology. The coronary calcium  score/coronary CTA interpretation by the cardiologist is attached.  COMPARISON:  July 05, 2023  FINDINGS: Cardiovascular: There are no significant extracardiac vascular findings.  Mediastinum/Nodes: There are no enlarged lymph nodes within the visualized mediastinum.  Lungs/Pleura: There is no pleural effusion. Stable 1 mm to  2 mm posterolateral bilateral lower lobe pulmonary nodules are noted (axial CT image 23 and 48, CT series 11). The bilateral upper lobe pulmonary nodules seen on the prior study are not included in the field of view.  Upper abdomen: There is a small hiatal hernia.  Musculoskeletal/Chest wall: No chest wall mass or suspicious osseous findings within the visualized chest.  IMPRESSION: 1. Stable 1 mm to 2 mm posterolateral bilateral lower lobe pulmonary nodules. 2. Small hiatal hernia. 3. The bilateral upper lobe pulmonary nodules seen on the prior study are not included in the field of view. Non-contrast chest CT at 3-6 months is recommended. If the nodules are stable at time of repeat CT, then future CT at 18-24 months (from today's scan) is considered optional for low-risk patients, but is recommended for high-risk patients. This recommendation follows the consensus statement: Guidelines for Management of Incidental Pulmonary Nodules Detected on CT Images: From the Fleischner Society 2017; Radiology 2017; 284:228-243.   Electronically Signed By: Suzen Dials M.D. On: 10/05/2023 23:33  Narrative HISTORY: Dyspnea on exertion (DOE)  EXAM: Cardiac/Coronary CT  TECHNIQUE: The patient was scanned on a [employer].  PROTOCOL: A 100 kV prospective scan was triggered in the descending thoracic aorta at 111 HU's. Axial non-contrast 3 mm slices were carried out through the heart. The data set was analyzed on a dedicated work station and scored using the Agatston method. Gantry rotation speed was 250 msecs and collimation was 0.6 mm. Heart rate was optimized medically and sl NTG was given. The 3D data set was reconstructed in 5% intervals of the 35-75 % of the R-R cycle. Systolic and diastolic phases were analyzed on a dedicated work station using MPR, MIP and VRT modes. The patient received 95mL OMNIPAQUE  IOHEXOL  350 MG/ML SOLN of contrast.  FINDINGS: Coronary  calcium  score: The patient's coronary artery calcium  score is 144, which places the patient in the 87th percentile.  Coronary arteries: Normal coronary origins.  Left dominance.  Right Coronary Artery: Normal caliber vessel. No significant plaque or stenosis.  Left Main Coronary Artery: Normal caliber vessel. No significant plaque or stenosis.  Left Anterior Descending Coronary Artery: Normal caliber vessel. Focal mixed calcified and noncalcified plaque in proximal and mid vessel with maximum 1-24% stenosis. Gives rise to small first, normal second, normal third diagonal branches.  Left Circumflex Artery: Normal caliber vessel, gives rise to PDA. Focal mixed calcified and noncalcified plaque at proximal OM1 with 1-24% stenosis. Gives rise to large, branching first OM branches.  Aorta: Normal size, 33 mm at the mid ascending aorta (level of the PA bifurcation) measured double oblique. No aortic atherosclerosis. No dissection seen in visualized portions of the aorta.  Aortic Valve: No calcifications. Trileaflet.  Other findings:  Normal pulmonary vein drainage into the left atrium.  Normal left atrial appendage without a thrombus.  Normal size of the pulmonary artery.  Normal appearance of the pericardium.  Hiatal hernia.  IMPRESSION: 1.  Minimal nonobstructive CAD, CADRADS = 1.  2. Coronary calcium  score of 144. This was 87th percentile for age-, sex-, and race- matched controls.  3. Total plaque volume 97 mm3 which is 51st percentile for age- and sex- matched controls (calcified plaque 22 mm3; noncalcified plaque 75 mm3). Total plaque volume is mild.  4. Normal coronary origin with left dominance.  5.  Hiatal hernia noted.  INTERPRETATION:  CAD-RADS 1: Minimal non-obstructive CAD (1-24%). Consider non-atherosclerotic causes of chest pain. Consider preventive therapy and risk factor modification.  Electronically Signed: By: [name] M.D. On:  09/25/2023 10:57     ______________________________________________________________________________________________      Risk Assessment/Calculations           Physical Exam VS:  BP 118/62   Pulse 64   Ht 5' 6 (1.676 m)   Wt 201 lb (91.2 kg)   SpO2 94%   BMI 32.44 kg/m        Wt Readings from Last 3 Encounters:  04/14/24 201 lb (91.2 kg)  04/04/24 199 lb (90.3 kg)  03/21/24 194 lb 6.4 oz (88.2 kg)    GEN: Well nourished, well developed in no acute distress NECK: No JVD; No carotid bruits CARDIAC: RRR, no murmurs, rubs, gallops RESPIRATORY:  Clear to auscultation without rales, wheezing or rhonchi  ABDOMEN: Soft, non-tender, non-distended EXTREMITIES:  No edema; No deformity   ASSESSMENT AND PLAN  Nonobstructive CAD / HLD, LDL goal <70 - Stable with no anginal symptoms. No indication for ischemic evaluation.  GDMT aspirin  81mg  daily, Toprol  25mg  daily, Rosuvastatin  40mg  daily. 02/2024 LDL 85, PCP increased Rosuvastatin  to 40mg  daily, will send updated Rx for 40mg  tablet so she no longer has to take two 20mg  tablets. She will return mid December for fasting lipid panel, ALT.  Recommend aiming for 150 minutes of moderate intensity activity per week and following a heart healthy diet.  Refer to PREP exercise program.   HFrEF - Euvolemic and well compensated on exam. GDMT limited by relative hypotension. Continue Toprol  25mg  daily. No indication for loop diuretic.   PVC - Denies palpitations. Continue Toprol  25mg  daily.        Dispo: follow up in 6 mos with Dr. Bruckner or APP  Signed, Reche GORMAN Finder, NP

## 2024-04-14 NOTE — Patient Instructions (Addendum)
 Medication Instructions:   INCREASE YOUR ROSUVASTATIN  (CRESTOR ) 40 MG BY MOUTH DAILY  *If you need a refill on your cardiac medications before your next appointment, please call your pharmacy*  Lab Work:  IN DECEMBER HERE AT OUR [facility] ON THE 3RD FLOOR [address]---LIPIDS AND ALT--PLEASE COME FASTING TO THIS LAB APPOINTMENT  If you have labs (blood work) drawn today and your tests are completely normal, you will receive your results only by: MyChart Message (if you have MyChart) OR A paper copy in the mail If you have any lab test that is abnormal or we need to change your treatment, we will call you to review the results.   Follow-Up: At [facility], you and your health needs are our priority.  As part of our continuing mission to provide you with exceptional heart care, our providers are all part of one team.  This team includes your primary Cardiologist (physician) and Advanced Practice Providers or APPs (Physician Assistants and Nurse Practitioners) who all work together to provide you with the care you need, when you need it.  Your next appointment:   6 month(s)  Provider:   [name], MD

## 2024-04-25 ENCOUNTER — Other Ambulatory Visit: Payer: Self-pay | Admitting: Family Medicine

## 2024-04-29 ENCOUNTER — Encounter: Payer: Self-pay | Admitting: Family Medicine

## 2024-04-29 ENCOUNTER — Encounter: Payer: Self-pay | Admitting: Pulmonary Disease

## 2024-04-30 ENCOUNTER — Other Ambulatory Visit: Payer: Self-pay | Admitting: *Deleted

## 2024-04-30 MED ORDER — RINVOQ 15 MG PO TB24: 1.0000 | ORAL_TABLET | Freq: Every day | ORAL | 0 refills | Status: AC

## 2024-04-30 NOTE — Telephone Encounter (Signed)
 Last Fill: 01/28/2024  Labs: 04/03/2024 CBC is normal, creatinine is elevated at 1.31. GFR is low at 45. Patient should avoid all NSAIDs. She does see her PCP for the evaluation of high creatinine and low GFR. Please forward results to PCP   TB Gold: 04/03/2024   TB Gold negative.   Next Visit: 06/16/2024  Last Visit: 01/17/2024  IK:Myzlfjunpi arthritis of multiple sites with negative rheumatoid factor   Current Dose per office note 01/17/2024: Rinvoq  15 mg 1 tablet by mouth daily   Okay to refill Rinvoq ?

## 2024-05-01 ENCOUNTER — Telehealth: Payer: Self-pay

## 2024-05-01 NOTE — Telephone Encounter (Signed)
 Gerrie want to do the next Prep Program at [place].

## 2024-05-07 ENCOUNTER — Other Ambulatory Visit: Payer: Self-pay

## 2024-05-07 DIAGNOSIS — J4489 Other specified chronic obstructive pulmonary disease: Secondary | ICD-10-CM

## 2024-05-07 MED ORDER — TRELEGY ELLIPTA 200-62.5-25 MCG/ACT IN AEPB: 1.0000 | INHALATION_SPRAY | Freq: Every day | RESPIRATORY_TRACT | 0 refills | Status: DC

## 2024-05-12 ENCOUNTER — Other Ambulatory Visit: Payer: Self-pay

## 2024-05-12 DIAGNOSIS — J4489 Other specified chronic obstructive pulmonary disease: Secondary | ICD-10-CM

## 2024-05-12 MED ORDER — TRELEGY ELLIPTA 200-62.5-25 MCG/ACT IN AEPB: 1.0000 | INHALATION_SPRAY | Freq: Every day | RESPIRATORY_TRACT | 3 refills | Status: AC

## 2024-05-13 ENCOUNTER — Telehealth: Payer: Self-pay

## 2024-05-15 ENCOUNTER — Encounter: Payer: Self-pay | Admitting: Neurology

## 2024-05-15 ENCOUNTER — Ambulatory Visit: Admitting: Neurology

## 2024-05-16 ENCOUNTER — Other Ambulatory Visit: Payer: Self-pay | Admitting: Physician Assistant

## 2024-05-19 NOTE — Telephone Encounter (Signed)
 Last Fill: 02/19/2024  Next Visit: 06/16/2024  Last Visit: 01/17/2024  Dx: Fibromyalgia   Current Dose per office note on 01/17/2024: Cymbalta  60 mg 1 capsule daily.   Okay to refill Cymbalta ?

## 2024-06-02 NOTE — Progress Notes (Deleted)
 "  Office Visit Note  Patient: Courtney Myers             Date of Birth: [birth year]           MRN: [id number]             PCP: [name], MD Referring: [name], MD Visit Date: 06/16/2024 Occupation: Data Unavailable  Subjective:    History of Present Illness: Courtney Myers is a 63 y.o. female with history of seronegative rheumatoid arthritis. Patient remains on  Rinvoq  15 mg 1 tablet by mouth daily (started 10/27/2   TB gold negative on 04/03/24.   CBC and CMP updated on 04/03/24.  Her next lab work will be due in January and every 3 months. Discussed the importance of holding rinvoq  if she develops signs or symptoms of an infection and to resume once the infection has completely cleared.    Activities of Daily Living:  Patient reports morning stiffness for *** {minute/hour:19697}.   Patient {ACTIONS;DENIES/REPORTS:21021675::Denies} nocturnal pain.  Difficulty dressing/grooming: {ACTIONS;DENIES/REPORTS:21021675::Denies} Difficulty climbing stairs: {ACTIONS;DENIES/REPORTS:21021675::Denies} Difficulty getting out of chair: {ACTIONS;DENIES/REPORTS:21021675::Denies} Difficulty using hands for taps, buttons, cutlery, and/or writing: {ACTIONS;DENIES/REPORTS:21021675::Denies}  No Rheumatology ROS completed.   PMFS History:  Patient Active Problem List   Diagnosis Date Noted   Abnormal LFTs 02/15/2024   Constipation, unspecified 02/15/2024   Diverticulosis 02/15/2024   Fatty liver disease, nonalcoholic 02/15/2024   Gastroesophageal reflux disease 02/15/2024   History of tobacco abuse 02/26/2022   Family history of heart disease 02/26/2022   PVC (premature ventricular contraction) 02/26/2022   Cardiomyopathy (HCC) 02/26/2022   Benign paroxysmal positional vertigo of right ear 08/25/2021   COPD (chronic obstructive pulmonary disease) (HCC) 11/16/2020   Encounter for screening for lung cancer 12/29/2019   Excessive sleepiness 09/08/2014    Fibromyalgia syndrome 08/25/2014   RA (rheumatoid arthritis) (HCC)    GERD (gastroesophageal reflux disease)    Anxiety    Depression    History of shingles    Synovitis of hand    Cervical dystonia 12/04/2012    Past Medical History:  Diagnosis Date   Anxiety    Cervical dystonia    COPD (chronic obstructive pulmonary disease) (HCC) 10/2020   Depression    Fibromyalgia    GERD (gastroesophageal reflux disease)    History of shingles    RA (rheumatoid arthritis) (HCC)    Synovitis of hand    Vertigo     Family History  Problem Relation Age of Onset   Arthritis Mother    COPD Mother    Heart Problems Father    Heart disease Father    Cancer Sister    COPD Sister    Heart disease Paternal Uncle        MI   Colon cancer Neg Hx    Colon polyps Neg Hx    Esophageal cancer Neg Hx    Rectal cancer Neg Hx    Stomach cancer Neg Hx    Past Surgical History:  Procedure Laterality Date   COLONOSCOPY  10/25/2009   TONSILLECTOMY     TUBAL LIGATION  1985   VAGINAL HYSTERECTOMY  09/01/2006   Social History   Tobacco Use   Smoking status: Former    Current packs/day: 0.00    Average packs/day: 0.3 packs/day for 43.4 years (10.8 ttl pk-yrs)    Types: Cigarettes    Start date: 51    Quit date: 11/16/2020    Years since quitting: 3.5    Passive  exposure: Current   Smokeless tobacco: Never  Vaping Use   Vaping status: Never Used  Substance Use Topics   Alcohol use: Yes    Alcohol/week: 0.0 standard drinks of alcohol    Comment: Rare, twice per year   Drug use: Never   Social History   Social History Narrative   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 Right handed.    Caffeine- 4 cups daily.     Immunization History  Administered Date(s) Administered   INFLUENZA, HIGH DOSE SEASONAL PF 03/21/2024   Influenza, Seasonal, Injecte, Preservative Fre 06/19/2023   Influenza,inj,Quad PF,6+  Mos 05/25/2018, 04/03/2019, 04/26/2020   Influenza-Unspecified 06/24/2014, 05/01/2015, 04/10/2016, 04/06/2017, 03/03/2022   Moderna Sars-Covid-2 Vaccination 08/29/2019, 09/26/2019, 06/19/2020   PNEUMOCOCCAL CONJUGATE-20 10/03/2021   Pneumococcal Conjugate Pcv21, Polysaccharide Crm197 Conjugaf 06/19/2023   Pneumococcal Conjugate-13 05/01/2015   Pneumococcal Polysaccharide-23 04/10/2016   Tdap 09/09/2015     Objective: Vital Signs: There were no vitals taken for this visit.   Physical Exam Vitals and nursing note reviewed.  Constitutional:      Appearance: She is well-developed.  HENT:     Head: Normocephalic and atraumatic.  Eyes:     Conjunctiva/sclera: Conjunctivae normal.  Cardiovascular:     Rate and Rhythm: Normal rate and regular rhythm.     Heart sounds: Normal heart sounds.  Pulmonary:     Effort: Pulmonary effort is normal.     Breath sounds: Normal breath sounds.  Abdominal:     General: Bowel sounds are normal.     Palpations: Abdomen is soft.  Musculoskeletal:     Cervical back: Normal range of motion.  Lymphadenopathy:     Cervical: No cervical adenopathy.  Skin:    General: Skin is warm and dry.     Capillary Refill: Capillary refill takes less than 2 seconds.  Neurological:     Mental Status: She is alert and oriented to person, place, and time.  Psychiatric:        Behavior: Behavior normal.      Musculoskeletal Exam: ***  CDAI Exam: CDAI Score: -- Patient Global: --; Provider Global: -- Swollen: --; Tender: -- Joint Exam 06/16/2024   No joint exam has been documented for this visit   There is currently no information documented on the homunculus. Go to the Rheumatology activity and complete the homunculus joint exam.  Investigation: No additional findings.  Imaging: No results found.  Recent Labs: Lab Results  Component Value Date   WBC 7.9 04/03/2024   HGB 12.7 04/03/2024   PLT 274 04/03/2024   NA 137 04/03/2024   K 4.3 04/03/2024    CL 102 04/03/2024   CO2 27 04/03/2024   GLUCOSE 88 04/03/2024   BUN 23 04/03/2024   CREATININE 1.31 (H) 04/03/2024   BILITOT 0.3 04/03/2024   ALKPHOS 91 11/18/2020   AST 23 04/03/2024   ALT 21 04/03/2024   PROT 6.9 04/03/2024   ALBUMIN 4.2 11/18/2020   CALCIUM  9.6 04/03/2024   GFRAA 61 11/02/2020   QFTBGOLD Negative 09/14/2015   QFTBGOLDPLUS NEGATIVE 04/03/2024    Speciality Comments: Remicade  6mg /kg every 6 weeks 07/13-09/21 Rinvoq  15 mg qd 04/28/20 MTX - dcd due to increase in Cr Arava  started 06/21/20-12/22- increase in cr   Procedures:  No procedures performed Allergies: Patient has no known allergies.   Assessment / Plan:     Visit Diagnoses: Rheumatoid arthritis of multiple sites with negative rheumatoid factor (  HCC)  High risk medication use  Sicca complex  Chronic pain of both knees  Fibromyalgia  Chronic fatigue  History of gastroesophageal reflux (GERD)  Anxiety and depression  History of COPD  Former smoker  Primary insomnia  Orders: No orders of the defined types were placed in this encounter.  No orders of the defined types were placed in this encounter.   Face-to-face time spent with patient was *** minutes. Greater than 50% of time was spent in counseling and coordination of care.  Follow-Up Instructions: No follow-ups on file.   Waddell CHRISTELLA Craze, PA-C  Note - This record has been created using Dragon software.  Chart creation errors have been sought, but may not always  have been located. Such creation errors do not reflect on  the standard of medical care.  "

## 2024-06-12 ENCOUNTER — Telehealth: Payer: Self-pay

## 2024-06-12 NOTE — Telephone Encounter (Signed)
 Call 06/12/24 Left a VM about the up coming Prep Program and ask to call back for more info.

## 2024-06-16 ENCOUNTER — Ambulatory Visit: Attending: Physician Assistant | Admitting: Physician Assistant

## 2024-06-16 DIAGNOSIS — F5101 Primary insomnia: Secondary | ICD-10-CM

## 2024-06-16 DIAGNOSIS — Z79899 Other long term (current) drug therapy: Secondary | ICD-10-CM

## 2024-06-16 DIAGNOSIS — M3500 Sicca syndrome, unspecified: Secondary | ICD-10-CM

## 2024-06-16 DIAGNOSIS — Z8709 Personal history of other diseases of the respiratory system: Secondary | ICD-10-CM

## 2024-06-16 DIAGNOSIS — M797 Fibromyalgia: Secondary | ICD-10-CM

## 2024-06-16 DIAGNOSIS — G8929 Other chronic pain: Secondary | ICD-10-CM

## 2024-06-16 DIAGNOSIS — Z8719 Personal history of other diseases of the digestive system: Secondary | ICD-10-CM

## 2024-06-16 DIAGNOSIS — M0609 Rheumatoid arthritis without rheumatoid factor, multiple sites: Secondary | ICD-10-CM

## 2024-06-16 DIAGNOSIS — Z87891 Personal history of nicotine dependence: Secondary | ICD-10-CM

## 2024-06-16 DIAGNOSIS — R5382 Chronic fatigue, unspecified: Secondary | ICD-10-CM

## 2024-06-16 DIAGNOSIS — F32A Depression, unspecified: Secondary | ICD-10-CM

## 2024-06-16 DIAGNOSIS — F419 Anxiety disorder, unspecified: Secondary | ICD-10-CM

## 2024-06-17 ENCOUNTER — Telehealth: Payer: Self-pay | Admitting: Pharmacist

## 2024-06-17 ENCOUNTER — Telehealth: Payer: Self-pay

## 2024-06-17 NOTE — Telephone Encounter (Signed)
 Received Rinvoq  PAP renewal application from [employer] requesting MD forms be completed within 2 weeks of 06/09/2024 to prevent closing of case  Prescriber form signed and sent to [facility] with med list  Please submit urgent PA renewal for Rinvoq . Need this attached with renewal forms  Sherry Pennant, PharmD, MPH, BCPS, CPP Clinical Pharmacist

## 2024-06-17 NOTE — Telephone Encounter (Signed)
 Spoke with Courtney Myers Will call her on 12/17 to set up [place] Prep Class Time.

## 2024-06-17 NOTE — Telephone Encounter (Signed)
 Submitted an URGENT Prior Authorization request to [employer] for RINVOQ  via CoverMyMeds. Will update once we receive a response.  Key: [id number]

## 2024-06-18 ENCOUNTER — Ambulatory Visit: Admitting: Physician Assistant

## 2024-06-18 NOTE — Telephone Encounter (Signed)
 Received notification from [employer] regarding a prior authorization for RINVOQ . Authorization has been APPROVED from 06/17/2024 to 12/16/2024. Approval letter sent to scan center.  Authorization # [id number]  Submitted Patient Assistance Application to [employer] for RINVOQ  with provider portion, prior authorization approval, and current medication list. Will update patient when we receive a response.  Phone: [phone] Fax: [phone]  Sherry Pennant, PharmD, MPH, BCPS, CPP Clinical Pharmacist

## 2024-06-19 ENCOUNTER — Telehealth: Payer: Self-pay

## 2024-06-19 NOTE — Telephone Encounter (Signed)
 Call to set next prep Program there was no answer Left a VM to reach back out.

## 2024-06-23 ENCOUNTER — Other Ambulatory Visit: Payer: Self-pay | Admitting: Cardiology

## 2024-06-23 DIAGNOSIS — I493 Ventricular premature depolarization: Secondary | ICD-10-CM

## 2024-06-23 DIAGNOSIS — I429 Cardiomyopathy, unspecified: Secondary | ICD-10-CM

## 2024-06-30 NOTE — Progress Notes (Unsigned)
 "  Office Visit Note  Patient: Courtney Myers             Date of Birth: 03/07/1961           MRN: [id number]             PCP: [name], MD Referring: [name], MD Visit Date: 07/01/2024 Occupation: Data Unavailable  Subjective:  Medication monitoring  History of Present Illness: Courtney Myers is a 63 y.o. female with history of seronegative rheumatoid arthritis. Patient remains on  Rinvoq  15 mg 1 tablet by mouth daily.  She is tolerating Rinvoq  without any side effects and has not had any gaps in therapy.  She denies any signs or symptoms of a rheumatoid arthritis flare.  She has not had any nocturnal pain or difficulty performing ADLs.  Patient hasincreased aching in both knees which he attributes to weight gain.  Patient has been started on Wellbutrin  which has helped significantly with her mood.  She plans on increasing her activity level to try to work on weight loss.  Patient denies any joint swelling at this time.  She denies any recent or recurrent infections.   Activities of Daily Living:  Patient reports morning stiffness for 15-20 minutes.   Patient Denies nocturnal pain.  Difficulty dressing/grooming: Denies Difficulty climbing stairs: Denies Difficulty getting out of chair: Denies Difficulty using hands for taps, buttons, cutlery, and/or writing: Denies  Review of Systems  Constitutional:  Positive for fatigue.  HENT:  Positive for mouth dryness. Negative for mouth sores.   Eyes:  Negative for dryness.  Cardiovascular:  Positive for palpitations. Negative for chest pain.  Gastrointestinal:  Negative for blood in stool, constipation and diarrhea.  Endocrine: Negative for increased urination.  Genitourinary:  Positive for decreased urine output and involuntary urination.  Musculoskeletal:  Positive for gait problem, myalgias, muscle weakness, morning stiffness, muscle tenderness and myalgias. Negative for joint pain, joint pain and joint  swelling.  Skin:  Positive for color change. Negative for rash, hair loss and sensitivity to sunlight.  Allergic/Immunologic: Negative for susceptible to infections.  Neurological:  Positive for dizziness. Negative for headaches.  Hematological:  Negative for swollen glands.  Psychiatric/Behavioral:  Positive for depressed mood and sleep disturbance. The patient is nervous/anxious.     PMFS History:  Patient Active Problem List   Diagnosis Date Noted   Abnormal LFTs 02/15/2024   Constipation, unspecified 02/15/2024   Diverticulosis 02/15/2024   Fatty liver disease, nonalcoholic 02/15/2024   Gastroesophageal reflux disease 02/15/2024   History of tobacco abuse 02/26/2022   Family history of heart disease 02/26/2022   PVC (premature ventricular contraction) 02/26/2022   Cardiomyopathy (HCC) 02/26/2022   Benign paroxysmal positional vertigo of right ear 08/25/2021   COPD (chronic obstructive pulmonary disease) (HCC) 11/16/2020   Encounter for screening for lung cancer 12/29/2019   Excessive sleepiness 09/08/2014   Fibromyalgia syndrome 08/25/2014   RA (rheumatoid arthritis) (HCC)    GERD (gastroesophageal reflux disease)    Anxiety    Depression    History of shingles    Synovitis of hand    Cervical dystonia 12/04/2012    Past Medical History:  Diagnosis Date   Anxiety    Cervical dystonia    COPD (chronic obstructive pulmonary disease) (HCC) 10/2020   Depression    Fibromyalgia    GERD (gastroesophageal reflux disease)    History of shingles    RA (rheumatoid arthritis) (HCC)    Synovitis of hand  Vertigo     Family History  Problem Relation Age of Onset   Arthritis Mother    COPD Mother    Heart Problems Father    Heart disease Father    Cancer Sister    COPD Sister    Heart disease Paternal Uncle        MI   Colon cancer Neg Hx    Colon polyps Neg Hx    Esophageal cancer Neg Hx    Rectal cancer Neg Hx    Stomach cancer Neg Hx    Past Surgical History:   Procedure Laterality Date   COLONOSCOPY  10/25/2009   TONSILLECTOMY     TUBAL LIGATION  1985   VAGINAL HYSTERECTOMY  09/01/2006   Social History   Tobacco Use   Smoking status: Former    Current packs/day: 0.00    Average packs/day: 0.3 packs/day for 43.4 years (10.8 ttl pk-yrs)    Types: Cigarettes    Start date: 15    Quit date: 11/16/2020    Years since quitting: 3.6    Passive exposure: Past   Smokeless tobacco: Never  Vaping Use   Vaping status: Never Used  Substance Use Topics   Alcohol use: Yes    Alcohol/week: 0.0 standard drinks of alcohol    Comment: Rare, twice per year   Drug use: Never   Social History   Social History Narrative   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 Right handed.    Caffeine- 4 cups daily.     Immunization History  Administered Date(s) Administered   INFLUENZA, HIGH DOSE SEASONAL PF 03/21/2024   Influenza, Seasonal, Injecte, Preservative Fre 06/19/2023   Influenza,inj,Quad PF,6+ Mos 05/25/2018, 04/03/2019, 04/26/2020   Influenza-Unspecified 06/24/2014, 05/01/2015, 04/10/2016, 04/06/2017, 03/03/2022   Moderna Sars-Covid-2 Vaccination 08/29/2019, 09/26/2019, 06/19/2020   PNEUMOCOCCAL CONJUGATE-20 10/03/2021   Pneumococcal Conjugate Pcv21, Polysaccharide Crm197 Conjugaf 06/19/2023   Pneumococcal Conjugate-13 05/01/2015   Pneumococcal Polysaccharide-23 04/10/2016   Tdap 09/09/2015     Objective: Vital Signs: BP 119/81   Pulse 76   Temp 97.6 F (36.4 C)   Resp 17   Ht 5' 6 (1.676 m)   Wt 197 lb 9.6 oz (89.6 kg)   BMI 31.89 kg/m    Physical Exam Vitals and nursing note reviewed.  Constitutional:      Appearance: She is well-developed.  HENT:     Head: Normocephalic and atraumatic.  Eyes:     Conjunctiva/sclera: Conjunctivae normal.  Cardiovascular:     Rate and Rhythm: Normal rate and regular rhythm.     Heart sounds: Normal heart  sounds.  Pulmonary:     Effort: Pulmonary effort is normal.     Breath sounds: Normal breath sounds.  Abdominal:     General: Bowel sounds are normal.     Palpations: Abdomen is soft.  Musculoskeletal:     Cervical back: Normal range of motion.  Lymphadenopathy:     Cervical: No cervical adenopathy.  Skin:    General: Skin is warm and dry.     Capillary Refill: Capillary refill takes less than 2 seconds.  Neurological:     Mental Status: She is alert and oriented to person, place, and time.  Psychiatric:        Behavior: Behavior normal.      Musculoskeletal Exam: C-spine, thoracic spine, lumbar spine have good range of motion.  Trapezius muscle tension and tenderness bilaterally.  No midline  spinal tenderness.  No SI joint tenderness.  Shoulder joints, elbow joints, wrist joints, MCPs, PIPs, DIPs have good range of motion with no synovitis.  Complete fist formation bilaterally.  Hip joints have good range of motion with no groin pain.  Knee joints have good range of motion no warmth or effusion.  Ankle joints have good range of motion no tenderness or joint swelling.    CDAI Exam: CDAI Score: -- Patient Global: --; Provider Global: -- Swollen: --; Tender: -- Joint Exam 07/01/2024   No joint exam has been documented for this visit   There is currently no information documented on the homunculus. Go to the Rheumatology activity and complete the homunculus joint exam.  Investigation: No additional findings.  Imaging: No results found.  Recent Labs: Lab Results  Component Value Date   WBC 7.9 04/03/2024   HGB 12.7 04/03/2024   PLT 274 04/03/2024   NA 137 04/03/2024   K 4.3 04/03/2024   CL 102 04/03/2024   CO2 27 04/03/2024   GLUCOSE 88 04/03/2024   BUN 23 04/03/2024   CREATININE 1.31 (H) 04/03/2024   BILITOT 0.3 04/03/2024   ALKPHOS 91 11/18/2020   AST 23 04/03/2024   ALT 21 04/03/2024   PROT 6.9 04/03/2024   ALBUMIN 4.2 11/18/2020   CALCIUM  9.6 04/03/2024    GFRAA 61 11/02/2020   QFTBGOLD Negative 09/14/2015   QFTBGOLDPLUS NEGATIVE 04/03/2024    Speciality Comments: Remicade  6mg /kg every 6 weeks 07/13-09/21 Rinvoq  15 mg qd 04/28/20 MTX - dcd due to increase in Cr Arava  started 06/21/20-12/22- increase in cr   Procedures:  No procedures performed Allergies: Patient has no known allergies.   Assessment / Plan:     Visit Diagnoses: Rheumatoid arthritis of multiple sites with negative rheumatoid factor (HCC) - She has no synovitis on examination today.  She has not had any signs or symptoms of a rheumatoid arthritis flare.  Her morning stiffness has only been lasting for 15 to 20 minutes daily.  She has not had any nocturnal pain or difficulty performing ADLs.  She has clinically been doing well taking Rinvoq  15 mg 1 tablet by mouth daily.  She is tolerating Rinvoq  without any side effects and has not had any gaps in therapy.  No recent or recurrent infections.  Rinvoq  continues to manage her symptoms of rheumatoid arthritis effectively.  No medication changes will be made at this time.  She was advised to notify us  if she develops any signs or symptoms of a flare.  She will follow-up in the office in 5 months or sooner if needed.  Plan: Lipid panel  High risk medication use-Rinvoq  15 mg 1 tablet by mouth daily.   TB gold negative on 04/03/24.   Lipid panel updated today.  CBC and CMP updated today.  No recent or recurrent infections.  Discussed the importance of holding rinvoq  if she develops signs or symptoms of an infection and to resume once the infection has completely cleared.   Plan: CBC with Differential/Platelet, Lipid panel, Comprehensive metabolic panel with GFR  Sicca complex: Patient continues to have chronic mouth dryness which has been tolerable overall.  Chronic pain of both knees: She has noticed iincreased aching in both knee joints which she attributes to weight gain.  She is planning to try to increase her activity level and  work on weight loss.  She has no warmth or effusion on examination today.  No nocturnal pain.  Fibromyalgia: Patient experiences intermittent myalgias and muscle tenderness  due to fibromyalgia.  Overall her symptoms have been manageable.  Discussed the importance of regular exercise and good sleep hygiene. She remains on Cymbalta  as prescribed.  Chronic fatigue: Discussed the importance of regular exercise.  Other medical conditions are listed as follows:  History of gastroesophageal reflux (GERD)  Anxiety and depression: She has been started on Wellbutrin  which she has found to be helpful at managing her symptoms of depression.  She also remains on Cymbalta  as prescribed.  History of COPD  Former smoker  Primary insomnia  Elevated triglycerides with high cholesterol -Plan to check lipid panel today-results will be forwarded to cardiology team.  Plan: Lipid panel  Orders: Orders Placed This Encounter  Procedures   CBC with Differential/Platelet   Lipid panel   Comprehensive metabolic panel with GFR   No orders of the defined types were placed in this encounter.   Follow-Up Instructions: Return in about 5 months (around 11/29/2024) for Rheumatoid arthritis.   Waddell CHRISTELLA Craze, PA-C  Note - This record has been created using Dragon software.  Chart creation errors have been sought, but may not always  have been located. Such creation errors do not reflect on  the standard of medical care.  "

## 2024-07-01 ENCOUNTER — Encounter: Payer: Self-pay | Admitting: Physician Assistant

## 2024-07-01 ENCOUNTER — Ambulatory Visit: Attending: Physician Assistant | Admitting: Physician Assistant

## 2024-07-01 VITALS — BP 119/81 | HR 76 | Temp 97.6°F | Resp 17 | Ht 66.0 in | Wt 197.6 lb

## 2024-07-01 DIAGNOSIS — R5382 Chronic fatigue, unspecified: Secondary | ICD-10-CM | POA: Diagnosis not present

## 2024-07-01 DIAGNOSIS — F32A Depression, unspecified: Secondary | ICD-10-CM | POA: Diagnosis present

## 2024-07-01 DIAGNOSIS — Z8719 Personal history of other diseases of the digestive system: Secondary | ICD-10-CM | POA: Diagnosis not present

## 2024-07-01 DIAGNOSIS — M0609 Rheumatoid arthritis without rheumatoid factor, multiple sites: Secondary | ICD-10-CM | POA: Diagnosis not present

## 2024-07-01 DIAGNOSIS — M797 Fibromyalgia: Secondary | ICD-10-CM | POA: Diagnosis not present

## 2024-07-01 DIAGNOSIS — G8929 Other chronic pain: Secondary | ICD-10-CM | POA: Insufficient documentation

## 2024-07-01 DIAGNOSIS — F419 Anxiety disorder, unspecified: Secondary | ICD-10-CM | POA: Diagnosis not present

## 2024-07-01 DIAGNOSIS — Z8709 Personal history of other diseases of the respiratory system: Secondary | ICD-10-CM | POA: Diagnosis not present

## 2024-07-01 DIAGNOSIS — E782 Mixed hyperlipidemia: Secondary | ICD-10-CM | POA: Diagnosis not present

## 2024-07-01 DIAGNOSIS — M25561 Pain in right knee: Secondary | ICD-10-CM | POA: Diagnosis not present

## 2024-07-01 DIAGNOSIS — Z79899 Other long term (current) drug therapy: Secondary | ICD-10-CM | POA: Diagnosis not present

## 2024-07-01 DIAGNOSIS — F5101 Primary insomnia: Secondary | ICD-10-CM | POA: Insufficient documentation

## 2024-07-01 DIAGNOSIS — M3500 Sicca syndrome, unspecified: Secondary | ICD-10-CM | POA: Insufficient documentation

## 2024-07-01 DIAGNOSIS — Z87891 Personal history of nicotine dependence: Secondary | ICD-10-CM | POA: Insufficient documentation

## 2024-07-01 DIAGNOSIS — M25562 Pain in left knee: Secondary | ICD-10-CM | POA: Insufficient documentation

## 2024-07-01 LAB — COMPREHENSIVE METABOLIC PANEL WITH GFR
AG Ratio: 1.9 (calc) (ref 1.0–2.5)
ALT: 21 U/L (ref 6–29)
AST: 25 U/L (ref 10–35)
Albumin: 4.7 g/dL (ref 3.6–5.1)
Alkaline phosphatase (APISO): 80 U/L (ref 37–153)
BUN: 17 mg/dL (ref 7–25)
CO2: 27 mmol/L (ref 20–32)
Calcium: 9.7 mg/dL (ref 8.6–10.4)
Chloride: 102 mmol/L (ref 98–110)
Creat: 1.05 mg/dL (ref 0.50–1.05)
Globulin: 2.5 g/dL (ref 1.9–3.7)
Glucose, Bld: 99 mg/dL (ref 65–99)
Potassium: 4.4 mmol/L (ref 3.5–5.3)
Sodium: 138 mmol/L (ref 135–146)
Total Bilirubin: 0.4 mg/dL (ref 0.2–1.2)
Total Protein: 7.2 g/dL (ref 6.1–8.1)
eGFR: 59 mL/min/1.73m2 — ABNORMAL LOW

## 2024-07-01 LAB — LIPID PANEL
Cholesterol: 158 mg/dL
HDL: 72 mg/dL
LDL Cholesterol (Calc): 67 mg/dL
Non-HDL Cholesterol (Calc): 86 mg/dL
Total CHOL/HDL Ratio: 2.2 (calc)
Triglycerides: 103 mg/dL

## 2024-07-01 LAB — CBC WITH DIFFERENTIAL/PLATELET
Absolute Lymphocytes: 1487 {cells}/uL (ref 850–3900)
Absolute Monocytes: 932 {cells}/uL (ref 200–950)
Basophils Absolute: 42 {cells}/uL (ref 0–200)
Basophils Relative: 0.5 %
Eosinophils Absolute: 92 {cells}/uL (ref 15–500)
Eosinophils Relative: 1.1 %
HCT: 42 % (ref 35.9–46.0)
Hemoglobin: 13.9 g/dL (ref 11.7–15.5)
MCH: 29.8 pg (ref 27.0–33.0)
MCHC: 33.1 g/dL (ref 31.6–35.4)
MCV: 90.1 fL (ref 81.4–101.7)
MPV: 11 fL (ref 7.5–12.5)
Monocytes Relative: 11.1 %
Neutro Abs: 5846 {cells}/uL (ref 1500–7800)
Neutrophils Relative %: 69.6 %
Platelets: 283 Thousand/uL (ref 140–400)
RBC: 4.66 Million/uL (ref 3.80–5.10)
RDW: 12.5 % (ref 11.0–15.0)
Total Lymphocyte: 17.7 %
WBC: 8.4 Thousand/uL (ref 3.8–10.8)

## 2024-07-03 ENCOUNTER — Ambulatory Visit: Payer: Self-pay | Admitting: Physician Assistant

## 2024-07-03 NOTE — Progress Notes (Signed)
 GFR is borderline low but has improved. Rest of CMP WNL.  CBC WNL Lipid panel WNL

## 2024-07-07 ENCOUNTER — Other Ambulatory Visit: Payer: Self-pay

## 2024-07-07 ENCOUNTER — Telehealth: Payer: Self-pay | Admitting: Family Medicine

## 2024-07-07 ENCOUNTER — Encounter (HOSPITAL_BASED_OUTPATIENT_CLINIC_OR_DEPARTMENT_OTHER): Payer: Self-pay

## 2024-07-07 DIAGNOSIS — K219 Gastro-esophageal reflux disease without esophagitis: Secondary | ICD-10-CM

## 2024-07-07 MED ORDER — OMEPRAZOLE 20 MG PO CPDR: 20.0000 mg | DELAYED_RELEASE_CAPSULE | Freq: Every day | ORAL | 3 refills | Status: AC

## 2024-07-07 MED ORDER — BUPROPION HCL ER (XL) 150 MG PO TB24: 150.0000 mg | ORAL_TABLET | Freq: Every day | ORAL | 3 refills | Status: AC

## 2024-07-07 NOTE — Telephone Encounter (Signed)
 Prescription Request  07/07/2024  LOV: 03/21/2024  What is the name of the medication or equipment?   Cyanocobalamin multidose 1ml vial   omeprazole  (PRILOSEC) 20 MG capsule   buPROPion  (WELLBUTRIN  XL) 150 MG 24 hr tablet  **90 day scripts requested**  Have you contacted your pharmacy to request a refill? yes   Which pharmacy would you like this sent to?  [facility] - [place], [place] - [address] [place] [address] Phone: [phone] Fax: [phone]    Patient notified that their request is being sent to the clinical staff for review and that they should receive a response within 2 business days.   Please advise pharmacist.

## 2024-07-09 ENCOUNTER — Telehealth: Payer: Self-pay

## 2024-07-09 MED ORDER — ROSUVASTATIN CALCIUM 40 MG PO TABS: 40.0000 mg | ORAL_TABLET | Freq: Every day | ORAL | 2 refills | Status: AC

## 2024-07-09 NOTE — Telephone Encounter (Signed)
 She no longer wants to do the Prep class because she is in a Program at the [facility].

## 2024-07-14 NOTE — Telephone Encounter (Signed)
 Pt contacted office stating that [employer] has informed her that they have not received her application, despite it having been submitted on 12/17 (pt had previously completed her portion and only the provider portion was required).

## 2024-07-14 NOTE — Telephone Encounter (Signed)
 Rinvoq  PAP application re-faxed

## 2024-07-14 NOTE — Telephone Encounter (Signed)
 Patient contacted office to follow up on prior authorization for Rinvoq  assistance. I advised the patient that the information was sent to [employer] and I would send it again.

## 2024-07-16 ENCOUNTER — Ambulatory Visit (INDEPENDENT_AMBULATORY_CARE_PROVIDER_SITE_OTHER): Admitting: Neurology

## 2024-07-16 ENCOUNTER — Other Ambulatory Visit: Payer: Self-pay | Admitting: Cardiology

## 2024-07-16 VITALS — BP 114/80

## 2024-07-16 DIAGNOSIS — G243 Spasmodic torticollis: Secondary | ICD-10-CM | POA: Diagnosis not present

## 2024-07-16 DIAGNOSIS — I493 Ventricular premature depolarization: Secondary | ICD-10-CM

## 2024-07-16 DIAGNOSIS — I429 Cardiomyopathy, unspecified: Secondary | ICD-10-CM

## 2024-07-16 MED ORDER — ABOBOTULINUMTOXINA 500 UNITS IM SOLR
500.0000 [IU] | Freq: Once | INTRAMUSCULAR | Status: AC
  Administered 2024-07-16: 500 [IU] via INTRAMUSCULAR

## 2024-07-16 MED ORDER — METOPROLOL SUCCINATE ER 25 MG PO TB24: 25.0000 mg | ORAL_TABLET | Freq: Every day | ORAL | 2 refills | Status: AC

## 2024-07-16 NOTE — Progress Notes (Signed)
 Dysport  500units x 1 vial  [phone] [id number] Exp-11/30/2025 B/B   Bacteriostatic 0.9% Sodium Chloride - 2.5 mL  Onu:[id number] Expiration: MAR-31-2027 NDC: 9590-8033-97 Dx: G24.3  Wintnessed by: Buddy Dustman, CMA

## 2024-07-16 NOTE — Progress Notes (Signed)
 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  Symptom onset was without apparent triggering event, she has gradually developed this neck pulling,  manifested primarily with right laterocollis, mild retrocolis, and left shoulder elevation since 2000. She has good relief with regular Botox injection, about 4 times a year since 2003. Last injection was February 18, 2010 by Dr. Ozell Hollow,  She reported great relief as usual, taking 4- 5 days to get symptom relieve, lasting about 3 months.  I began to inject her since 12.2011, every 3-4 months, dosage has been limited to 150 units of BOTOX A.  She denies neck pain, gait difficulty, but with wearing off Botox, she noticed increased head tremor, and pulling towards her right shoulder.  She developed  transient difficulty lifting her neck up when using BOTOX A 200 units previously, reponding well to decreased dose of 150 units  She was enrolled into IPSEN dysport  study, first injection was in April 30th 2013, she later enrolled into open label, 2nd injection was in  May 28th 2013. She did very well.  Recent few weeks,  She had experienced flareup of her rheumatoid arthritis, has received IV infusion. Last injection was in 06/10/2012,  500 units of dysport  was dissolved into 2 cc of normal saline. Right longissimus capitus 0.5 cc Right splenius capitis 0.5 cc Right levator scapular 0.5 cc Right iliocostalis 0.5 cc   Dysport  research study has completed, she is now coming back for repeat injection, she noticed head bobbing over the past few weeks, she denies significant neck pain, weakness,  Her rheumatoid arthritis is under better control   Last injection was in Sep 2014, she did very well, no neck extension weakness, only rarely neck shaking  UPDATE March 8th 2016: She has lost follow-up since her last  visit December 2014, She came in for treatment her cervical dystonia, she feel the tension of her neck all the time, stiffness. Denied gait difficulty, last injection was September 2014, she received 500 units of Dysport , works well for her, she is continue on medication for anxiety, rheumatoid arthritis She also complains of nighttime snoring, frequent awakening, excessive daytime sleepiness, fatigue, today's ESS is 6, FSS was 59,  UPDATE October 13 2014:   She has missed her scheduled sleep study, continue have worsening neck posturing, posterior neck pain, excessive daytime fatigue and sleepiness  UPDATE Nov 11 2014: She came in for EMG guided Dysport  injection today, last injection was December 2014, she complains of worsening head titubation, bilateral hands mild postural tremor, posterior neck pain,  Update February 17 2015 She responded very well to last EMG guided Dysport  injection Nov 11 2014, no significant head titubation, no significant neck pain  Update July 22 2015: Last injection was August, now she noticed returning of neck pulling and shaking, posterior neck muscle achy pain  Update October 21 2015: Last EMG guided Dysport  injection was in January, she responded very well, only recent couple weeks, she noticed returning of neck pulling, shaking again, She has worsening rheumatoid arthritis, is receiving more frequent treatment, complains fatigue, diffuse body achy pain  Update March 15 2016: She responded very well to previous Dysport  injection in April, no significant side effect noticed, only recent few weeks, 5 months from previous injection, she noticed return of head shaking neck muscle tightness.  UPDATE Dec 14th 2017: She responded  very well to previous injection in Sept 2017, no significant side effect noticed.  Update September 13 2016: She did well this last injection in December  Update January 15 2017: She noticed worsening head tremor, she had excessive stress,  her sister passed away from lung cancer in April 2018,  UPDATE Apr 25 2017: She responded very well with previous injection, barely has noticeable head shaking.  Update August 01, 2017: She responded very well to previous dysport  injection, only recently began to have recurrent head shaking  UPDATE December 19 2017: She responded well to previous injection  UPDATE Jun 20 2018: She did very well with previous injection  UPDATE October 01 2018: She did well to previous injection  UPDATE January 01 2019: She did well with previous injection  UPDATE Oct 28th 2020: She did well to previous injections  UPDATE Jan 27th 2021: She did well to previous injections  Update October 29, 2019: She did well with previous injection, no significant side effect noted.  Update February 04, 2020, Uses Dysport  500 units, responding well  UPDATE Jun 02 2020: Uses Dysport  500 units, responding well  Update September 28, 2020: She responded well to previous injection, unfortunately she has been a longtime smoker, diagnosed with COPD, chest x-ray showed bibasilar atelectasis, and infiltration, small left pleural effusion on September 08, 2020  UPDATE December 01 2021: She did well with his previous injection,  UPDATE September 12 2021: She retired the end of October 2023, neck overall doing well well, only occasionally shaking, muscle tension  UPDATE January 01 2023: She responded well to previous injection  UPDATE Jun 20 2023: She responded well to previous injection no significant side effect noted,  UPDATE September 17 2023: Denied neck pain, but increased head titubation turning towards the right,  UPDATE February 13 2024: This is 5 months from previous injection, she did well, only mild neck muscle tension, titubation  UPDATE Jul 16 2024: She denies significant neck pain, frequent neck titubation  PHYSICAL EXAM   Today's Vitals   07/16/24 1426  BP: 114/80    PHYSICAL EXAMNIATION:   Mild retrocollis, mild right  tilt,\with mild left shoulder elevation  ASSESSMENT AND PLAN  [name] is a 64 y.o. female with long-standing history of cervical dystonia, responded very well to EMG guided Dysport  injection,  Mild retrocollis, right tilt, mild left shoulder elevation, occasionally no-no titubation  500 of Dysport  was dissolved into 2.5 cc of normal saline,  Right splenius capitus 0.25 ccx2=0.5 Right levator scapular 0.25 cc Right splenius cervix 0.25 ccx2=0.5 cc  Left splenius cervix 0.25 Left splenius capitis 0.5 Left levator scapular 0.5 cc   Patient tolerate the injection well, will return to clinic in 3 months or longer for repeat injection     Modena Callander, M.D. Ph.D. --------------------- [facility] [address] [place], [place] [address] Ph: [phone] Fax: [phone] "

## 2024-07-18 ENCOUNTER — Other Ambulatory Visit: Payer: Self-pay

## 2024-07-18 MED ORDER — CLONAZEPAM 0.5 MG PO TABS: 0.5000 mg | ORAL_TABLET | Freq: Two times a day (BID) | ORAL | 3 refills | Status: AC | PRN

## 2024-07-20 ENCOUNTER — Other Ambulatory Visit: Payer: Self-pay | Admitting: Physician Assistant

## 2024-07-21 ENCOUNTER — Other Ambulatory Visit: Payer: Self-pay

## 2024-07-21 MED ORDER — DULOXETINE HCL 60 MG PO CPEP: 60.0000 mg | ORAL_CAPSULE | Freq: Every day | ORAL | 0 refills | Status: AC

## 2024-07-21 NOTE — Telephone Encounter (Signed)
 Last Fill: 05/19/2024  Next Visit: 12/04/2024  Last Visit: 07/01/2024  DX: Fibromyalgia   Current Dose per office note on 07/01/2024: She remains on Cymbalta  as prescribed.   Okay to refill cymbalta ?

## 2024-07-25 NOTE — Telephone Encounter (Signed)
 Received a fax from  [employer] regarding an approval for RINVOQ  patient assistance from 07/03/2024 to 07/02/2025. Approval letter sent to scan center. Attempted to contact pt to discuss, LVM providing update and advising that she would be able to continue filling her medication as normal.  Nothing further needed at this time.

## 2024-09-25 ENCOUNTER — Other Ambulatory Visit (HOSPITAL_BASED_OUTPATIENT_CLINIC_OR_DEPARTMENT_OTHER)

## 2024-10-15 ENCOUNTER — Ambulatory Visit: Admitting: Neurology

## 2024-12-04 ENCOUNTER — Ambulatory Visit: Admitting: Rheumatology

## 2025-03-23 ENCOUNTER — Encounter: Admitting: Family Medicine
